# Patient Record
Sex: Female | Born: 1950 | Race: White | Hispanic: No | Marital: Single | Smoking: Never smoker
Health system: Southern US, Community
[De-identification: ages and names within clinical notes are randomized; demographics above are authoritative.]

## PROBLEM LIST (undated history)

## (undated) DIAGNOSIS — I10 Essential (primary) hypertension: Secondary | ICD-10-CM

## (undated) DIAGNOSIS — A419 Sepsis, unspecified organism: Secondary | ICD-10-CM

## (undated) DIAGNOSIS — K8020 Calculus of gallbladder without cholecystitis without obstruction: Secondary | ICD-10-CM

## (undated) DIAGNOSIS — N200 Calculus of kidney: Secondary | ICD-10-CM

## (undated) DIAGNOSIS — G809 Cerebral palsy, unspecified: Secondary | ICD-10-CM

## (undated) DIAGNOSIS — K219 Gastro-esophageal reflux disease without esophagitis: Secondary | ICD-10-CM

## (undated) DIAGNOSIS — N189 Chronic kidney disease, unspecified: Secondary | ICD-10-CM

## (undated) DIAGNOSIS — N12 Tubulo-interstitial nephritis, not specified as acute or chronic: Secondary | ICD-10-CM

## (undated) DIAGNOSIS — F419 Anxiety disorder, unspecified: Secondary | ICD-10-CM

## (undated) DIAGNOSIS — Z87442 Personal history of urinary calculi: Secondary | ICD-10-CM

## (undated) DIAGNOSIS — F79 Unspecified intellectual disabilities: Secondary | ICD-10-CM

## (undated) DIAGNOSIS — E7800 Pure hypercholesterolemia, unspecified: Secondary | ICD-10-CM

## (undated) DIAGNOSIS — E039 Hypothyroidism, unspecified: Secondary | ICD-10-CM

---

## 2011-02-11 ENCOUNTER — Other Ambulatory Visit: Payer: Self-pay

## 2011-02-11 ENCOUNTER — Inpatient Hospital Stay (HOSPITAL_COMMUNITY)
Admission: EM | Admit: 2011-02-11 | Discharge: 2011-02-14 | DRG: 690 | Disposition: A | Discharge status: Home Health | Payer: PRIVATE HEALTH INSURANCE | Attending: Internal Medicine | Admitting: Internal Medicine

## 2011-02-11 ENCOUNTER — Emergency Department (HOSPITAL_COMMUNITY): Payer: PRIVATE HEALTH INSURANCE

## 2011-02-11 ENCOUNTER — Encounter (HOSPITAL_COMMUNITY): Payer: Self-pay

## 2011-02-11 DIAGNOSIS — Z6841 Body Mass Index (BMI) 40.0 and over, adult: Secondary | ICD-10-CM

## 2011-02-11 DIAGNOSIS — Z79899 Other long term (current) drug therapy: Secondary | ICD-10-CM

## 2011-02-11 DIAGNOSIS — F79 Unspecified intellectual disabilities: Secondary | ICD-10-CM | POA: Diagnosis present

## 2011-02-11 DIAGNOSIS — N390 Urinary tract infection, site not specified: Secondary | ICD-10-CM

## 2011-02-11 DIAGNOSIS — I10 Essential (primary) hypertension: Secondary | ICD-10-CM | POA: Diagnosis present

## 2011-02-11 DIAGNOSIS — N10 Acute tubulo-interstitial nephritis: Secondary | ICD-10-CM | POA: Diagnosis present

## 2011-02-11 DIAGNOSIS — R1011 Right upper quadrant pain: Secondary | ICD-10-CM | POA: Diagnosis present

## 2011-02-11 DIAGNOSIS — E079 Disorder of thyroid, unspecified: Secondary | ICD-10-CM | POA: Diagnosis present

## 2011-02-11 DIAGNOSIS — R630 Anorexia: Secondary | ICD-10-CM | POA: Diagnosis present

## 2011-02-11 DIAGNOSIS — E119 Type 2 diabetes mellitus without complications: Secondary | ICD-10-CM | POA: Diagnosis present

## 2011-02-11 DIAGNOSIS — E7800 Pure hypercholesterolemia, unspecified: Secondary | ICD-10-CM | POA: Diagnosis present

## 2011-02-11 DIAGNOSIS — F411 Generalized anxiety disorder: Secondary | ICD-10-CM | POA: Diagnosis present

## 2011-02-11 DIAGNOSIS — N12 Tubulo-interstitial nephritis, not specified as acute or chronic: Secondary | ICD-10-CM

## 2011-02-11 DIAGNOSIS — G809 Cerebral palsy, unspecified: Secondary | ICD-10-CM | POA: Diagnosis present

## 2011-02-11 DIAGNOSIS — K8020 Calculus of gallbladder without cholecystitis without obstruction: Secondary | ICD-10-CM | POA: Diagnosis present

## 2011-02-11 DIAGNOSIS — N179 Acute kidney failure, unspecified: Secondary | ICD-10-CM | POA: Diagnosis present

## 2011-02-11 DIAGNOSIS — Z7982 Long term (current) use of aspirin: Secondary | ICD-10-CM

## 2011-02-11 DIAGNOSIS — N19 Unspecified kidney failure: Secondary | ICD-10-CM

## 2011-02-11 DIAGNOSIS — F4000 Agoraphobia, unspecified: Secondary | ICD-10-CM | POA: Diagnosis present

## 2011-02-11 DIAGNOSIS — E871 Hypo-osmolality and hyponatremia: Secondary | ICD-10-CM | POA: Diagnosis present

## 2011-02-11 DIAGNOSIS — E860 Dehydration: Secondary | ICD-10-CM | POA: Diagnosis present

## 2011-02-11 HISTORY — DX: Unspecified intellectual disabilities: F79

## 2011-02-11 HISTORY — DX: Pure hypercholesterolemia, unspecified: E78.00

## 2011-02-11 HISTORY — DX: Anxiety disorder, unspecified: F41.9

## 2011-02-11 HISTORY — DX: Essential (primary) hypertension: I10

## 2011-02-11 LAB — DIFFERENTIAL
Basophils Absolute: 0 10*3/uL (ref 0.0–0.1)
Basophils Relative: 0 % (ref 0–1)
Lymphocytes Relative: 5 % — ABNORMAL LOW (ref 12–46)
Monocytes Absolute: 1.1 10*3/uL — ABNORMAL HIGH (ref 0.1–1.0)
Monocytes Relative: 5 % (ref 3–12)
Neutro Abs: 19.9 10*3/uL — ABNORMAL HIGH (ref 1.7–7.7)
Neutrophils Relative %: 90 % — ABNORMAL HIGH (ref 43–77)

## 2011-02-11 LAB — COMPREHENSIVE METABOLIC PANEL
AST: 25 U/L (ref 0–37)
Albumin: 2.7 g/dL — ABNORMAL LOW (ref 3.5–5.2)
Alkaline Phosphatase: 101 U/L (ref 39–117)
CO2: 19 mEq/L (ref 19–32)
Chloride: 96 mEq/L (ref 96–112)
Creatinine, Ser: 3.11 mg/dL — ABNORMAL HIGH (ref 0.50–1.10)
GFR calc non Af Amer: 15 mL/min — ABNORMAL LOW (ref >90)
Potassium: 4 mEq/L (ref 3.5–5.1)
Total Bilirubin: 0.5 mg/dL (ref 0.3–1.2)

## 2011-02-11 LAB — URINALYSIS, ROUTINE W REFLEX MICROSCOPIC
Glucose, UA: NEGATIVE mg/dL
Ketones, ur: NEGATIVE mg/dL
Protein, ur: 30 mg/dL — AB
pH: 5 (ref 5.0–8.0)

## 2011-02-11 LAB — URINE MICROSCOPIC-ADD ON

## 2011-02-11 LAB — CBC
HCT: 34.9 % — ABNORMAL LOW (ref 36.0–46.0)
Hemoglobin: 11.7 g/dL — ABNORMAL LOW (ref 12.0–15.0)
MCHC: 33.5 g/dL (ref 30.0–36.0)
RDW: 13.4 % (ref 11.5–15.5)
WBC: 22.2 10*3/uL — ABNORMAL HIGH (ref 4.0–10.5)

## 2011-02-11 MED ORDER — DEXTROSE 5 % IV SOLN
1.0000 g | Freq: Once | INTRAVENOUS | Status: AC
  Administered 2011-02-11: 1 g via INTRAVENOUS
  Filled 2011-02-11: qty 10

## 2011-02-11 MED ORDER — SODIUM CHLORIDE 0.9 % IV BOLUS (SEPSIS)
1000.0000 mL | Freq: Once | INTRAVENOUS | Status: AC
  Administered 2011-02-11: 1000 mL via INTRAVENOUS

## 2011-02-11 MED ORDER — SODIUM CHLORIDE 0.9 % IV BOLUS (SEPSIS)
500.0000 mL | Freq: Once | INTRAVENOUS | Status: AC
  Administered 2011-02-11: 500 mL via INTRAVENOUS

## 2011-02-11 NOTE — ED Notes (Signed)
Has an elevated white count and infection in gall bladder. Abdominal pain per sister.  Had an antibiotic shot at home today by MD. Was sent over by the MD to be evaluated for infection and possible surgery per family. Had a pill for sedation prior to arrival and also had a xanax per sister. Has not been out of the house for 31 years per family.

## 2011-02-11 NOTE — H&P (Signed)
PCP:   Marval Regal, MD, MD   Chief Complaint:  Fever abdominal pain  HPI:  61 year old female with a history of diabetes, hypertension, hypothyroidism, mental retardation who is basically nonverbal and severe a Gore phobia who is not left her house in over 30 years presents to emergency department after several days of running fevers up to 103 with very foul-smelling urine and abdominal pain. She is ambulatory at home, she is able to feed herself but she cannot effectively communicate. She is taking care of by her siblings both of her parents are deceased. Her primary care physician does home visits and went to see her yesterday at home because she has been running fever and complaining of abdominal pain which is thought to be in the right upper quadrant. It was thought that she had acute cholecystitis and was significantly dehydrated so was decided to sedate her in order for her to come to the hospital. She has not been screaming out her complaining of any abdominal pain since she's been here however again she's been mildly sedated. She is currently awake and alert and appears comfortable. One of her sisters is with her. She was given a shot of antibiotic yesterday at home by her primary care physician which am assuming was probably Rocephin. I'm not sure if the urine culture was sent off yesterday during that home evaluation. Her sister says she's not been eating and drinking for several days and she frequently has been grasping at her right upper quadrant and saying that it hurts. Specific history of the pain is difficult due to the patient's mental retardation, and she cannot answer specific questions and is unreliable. There is been no nausea or vomiting or diarrhea. There has been no significant cough or dyspnea.  Review of Systems:  Otherwise unobtainable from the patient  Past Medical History: Past Medical History  Diagnosis Date  . Diabetes mellitus   . Hypertension   . Thyroid disease    . Anxiety   . High cholesterol   . MR (mental retardation)    History reviewed. No pertinent past surgical history.  Medications: Prior to Admission medications   Medication Sig Start Date End Date Taking? Authorizing Provider  alprazolam Duanne Moron) 2 MG tablet Take 1 mg by mouth 3 (three) times daily.   Yes Historical Provider, MD  aspirin EC 81 MG tablet Take 81 mg by mouth daily.   Yes Historical Provider, MD  busPIRone (BUSPAR) 10 MG tablet Take 10 mg by mouth daily.   Yes Historical Provider, MD  chlorproMAZINE (THORAZINE) 25 MG tablet Take 25 mg by mouth 2 (two) times daily.   Yes Historical Provider, MD  escitalopram (LEXAPRO) 20 MG tablet Take 20 mg by mouth at bedtime.   Yes Historical Provider, MD  levothyroxine (SYNTHROID, LEVOTHROID) 25 MCG tablet Take 25 mcg by mouth daily.   Yes Historical Provider, MD  metFORMIN (GLUCOPHAGE) 500 MG tablet Take 500 mg by mouth daily as needed. As needed if blood sugar levels are higher than normal   Yes Historical Provider, MD  promethazine (PHENERGAN) 25 MG tablet Take 25 mg by mouth every 6 (six) hours as needed. For nausea. **Take one tablet by mouth every 4 to 6 hours as needed for nausea**   Yes Historical Provider, MD  spironolactone (ALDACTONE) 50 MG tablet Take 50 mg by mouth 2 (two) times daily.   Yes Historical Provider, MD  zolpidem (AMBIEN) 10 MG tablet Take 10 mg by mouth at bedtime.   Yes Historical  Provider, MD    Allergies:  No Known Allergies  Social History:  reports that she has never smoked. She does not have any smokeless tobacco history on file. She reports that she does not drink alcohol or use illicit drugs.  Family History: No family history on file.  Physical Exam: Filed Vitals:   02/11/11 1939 02/11/11 2054 02/11/11 2104 02/11/11 2142  BP: 102/42 97/62 110/54 110/54  Pulse:  88  88  Temp: 99.2 F (37.3 C)  97.9 F (36.6 C)   TempSrc: Oral  Oral   Resp: 28 20 32   Height: 5' (1.524 m)     Weight:  158.759 kg (350 lb)     SpO2: 92% 94% 94% 93%   BP 101/41  Pulse 88  Temp(Src) 98.7 F (37.1 C) (Oral)  Resp 28  Ht 5' (1.524 m)  Wt 137.4 kg (302 lb 14.6 oz)  BMI 59.16 kg/m2  SpO2 97% General appearance: alert, cooperative, no distress and moderately obese Lungs: clear to auscultation bilaterally Heart: regular rate and rhythm, S1, S2 normal, no murmur, click, rub or gallop Abdomen: soft, non-tender; bowel sounds normal; no masses,  no organomegaly Extremities: extremities normal, atraumatic, no cyanosis or edema Pulses: 2+ and symmetric Skin: Skin color, texture, turgor normal. No rashes or lesions Neurologic: Grossly normal    Labs on Admission:   [facility] 02/11/11 1957  NA 130*  K 4.0  CL 96  CO2 19  GLUCOSE 129*  BUN 53*  CREATININE 3.11*  CALCIUM 9.1  MG --  PHOS --    Basename 02/11/11 1957  AST 25  ALT 18  ALKPHOS 101  BILITOT 0.5  PROT 7.6  ALBUMIN 2.7*    Basename 02/11/11 1957  LIPASE 12  AMYLASE --    Basename 02/11/11 1957  WBC 22.2*  NEUTROABS 19.9*  HGB 11.7*  HCT 34.9*  MCV 84.1  PLT 214    Radiological Exams on Admission: Dg Chest Port 1 View  02/11/2011  *RADIOLOGY REPORT*  Clinical Data: Fever.  Elevated white blood count.  PORTABLE CHEST - 1 VIEW  Comparison: None.  Findings: There is mild cardiomegaly.  Pulmonary vascularity is normal and the lungs are clear.  No osseous abnormality.  IMPRESSION: Mild cardiomegaly.  Original Report Authenticated By: Larey Seat, M.D.    Assessment/Plan Present on Admission:  61 year old female with several days of fever and abdominal pain with a history of severe bore phobia and mental retardation  .Abdominal pain, right upper quadrant this could be pyelonephritis she has a significant urinary tract infection going to place her on Rocephin urine culture has been sent off for culture her abdominal exam is benign by myself and by the emergency room physician. However she did have to be  sedated to get her to the hospital today but she seems to be at her baseline right now according to her sister and her abdominal exam is still benign. I'm also going to proceed with ultrasound of her abdomen to look at her gallbladder better and also to assess her kidneys.  Marland KitchenUTI (urinary tract infection) Rocephin  .Pyelonephritis probable  .Mental retardation .Agoraphobia continue Xanax as needed  .Acute renal failure she has significant renal failure again proceed with ultrasound this is likely due to significant dehydration and infection placed on IV fluids and monitor her urinary output and creatinine closely.  Marland KitchenHyponatremia IV fluids  .Dehydration IV fluids  Going to continue her chronic Xanax and try to hold off on excessive sedation unless  her anxiety gets extreme. Currently she seems to be dealing with everything very well and does not appear overly anxious or uncomfortable. Family already has home health and a Education officer, museum set up at home. There are several siblings involved and nieces and nephews that helps to take care of her. There is no healthcare power of attorney however that is officially decided upon. This probably needs to be addressed before she is discharged. She will likely improve over the next several days with IV fluids and antibiotics.   Rachel Morgan A [id number] 02/11/2011, 10:58 PM

## 2011-02-11 NOTE — ED Provider Notes (Signed)
History   This chart was scribed for Sharyon Cable, MD by Kathreen Cornfield. The patient was seen in room APA04/APA04 and the patient's care was started at 7:42PM.    CSN: [id number]  Arrival date & time 02/11/11  [id number]   First MD Initiated Contact with Patient 02/11/11 1939     Level 5 Caveat: history of mental retardation    Chief Complaint  Patient presents with  . Abdominal Pain     Patient is a 61 y.o. female presenting with abdominal pain. The history is provided by the patient and a relative. The history is limited by the condition of the patient. No language interpreter was used.  Abdominal Pain The primary symptoms of the illness include abdominal pain (Pt holds her stomach and says "it hurts".) and vomiting. The primary symptoms of the illness do not include shortness of breath or diarrhea. The current episode started 6 to 12 hours ago. The onset of the illness was sudden. The problem has not changed since onset. The abdominal pain began 6 to 12 hours ago. The pain came on suddenly. The abdominal pain has been unchanged since its onset. The abdominal pain is generalized. The abdominal pain does not radiate. The abdominal pain is relieved by nothing.  The patient states that she believes she is currently not pregnant. The patient has not had a change in bowel habit. Symptoms associated with the illness do not include chills, anorexia or diaphoresis. Significant associated medical issues include diabetes. Significant associated medical issues do not include gallstones.   Pt has a history of diabetes, thyroid disease, and high cholesterol. She has h/o mental retardation and severe agoraphobia, per family pt has not left house in 30 yrs She has been seen by her PCP at her house, and it was felt that she had acute cholecystitis as she had fevers at home and elevated WBC by labs Pt has had pain on/off for years per family but worse in past day    History  Substance Use Topics  .  Smoking status: Never Smoker   . Smokeless tobacco: Not on file  . Alcohol Use: No    OB History    Grav Para Term Preterm Abortions TAB SAB Ect Mult Living                  Review of Systems  Unable to perform ROS: Psychiatric disorder  Constitutional: Negative for chills and diaphoresis.  Respiratory: Negative for shortness of breath.   Gastrointestinal: Positive for vomiting and abdominal pain (Pt holds her stomach and says "it hurts".). Negative for diarrhea and anorexia.    Allergies  Review of patient's allergies indicates no known allergies.  Home Medications   Current Outpatient Rx  Name Route Sig Dispense Refill  . ALPRAZOLAM 2 MG PO TABS Oral Take 1 mg by mouth 3 (three) times daily.    . ASPIRIN EC 81 MG PO TBEC Oral Take 81 mg by mouth daily.    . BUSPIRONE HCL 10 MG PO TABS Oral Take 10 mg by mouth daily.    . CHLORPROMAZINE HCL 25 MG PO TABS Oral Take 25 mg by mouth 2 (two) times daily.    Marland Kitchen ESCITALOPRAM OXALATE 20 MG PO TABS Oral Take 20 mg by mouth at bedtime.    Marland Kitchen LEVOTHYROXINE SODIUM 25 MCG PO TABS Oral Take 25 mcg by mouth daily.    Marland Kitchen METFORMIN HCL 500 MG PO TABS Oral Take 500 mg by mouth daily as needed.  As needed if blood sugar levels are higher than normal    . PROMETHAZINE HCL 25 MG PO TABS Oral Take 25 mg by mouth every 6 (six) hours as needed. For nausea. **Take one tablet by mouth every 4 to 6 hours as needed for nausea**    . SPIRONOLACTONE 50 MG PO TABS Oral Take 50 mg by mouth 2 (two) times daily.    Marland Kitchen ZOLPIDEM TARTRATE 10 MG PO TABS Oral Take 10 mg by mouth at bedtime.      BP 102/42  Temp(Src) 99.2 F (37.3 C) (Oral)  Resp 28  Ht 5' (1.524 m)  Wt 350 lb (158.759 kg)  BMI 68.35 kg/m2  SpO2 92%  Physical Exam  CONSTITUTIONAL: Well developed/well nourished HEAD AND FACE: Normocephalic/atraumatic EYES: EOMI/PERRL ENMT: Mucous membranes dry NECK: supple no meningeal signs SPINE:entire spine nontender CV: S1/S2 noted, no  murmurs/rubs/gallops noted LUNGS:  decreased breath sounds bilaterally ABDOMEN: soft, nontender, no rebound or guarding.  No RUQ tenderness GU:no cva tenderness NEURO: Pt is awake/alert, moves all extremitiesx4 , pt follows commands but has minimal verbal response (baseline) EXTREMITIES: pulses normal, full ROM SKIN: warm, color normal PSYCH: no abnormalities of mood noted, anxious.    ED Course  Procedures   DIAGNOSTIC STUDIES: Oxygen Saturation is 92% on room air, low by my interpretation.    COORDINATION OF CARE:  Results for orders placed during the hospital encounter of 02/11/11  CBC      Component Value Range   WBC 22.2 (*) 4.0 - 10.5 (K/uL)   RBC 4.15  3.87 - 5.11 (MIL/uL)   Hemoglobin 11.7 (*) 12.0 - 15.0 (g/dL)   HCT 34.9 (*) 36.0 - 46.0 (%)   MCV 84.1  78.0 - 100.0 (fL)   MCH 28.2  26.0 - 34.0 (pg)   MCHC 33.5  30.0 - 36.0 (g/dL)   RDW 13.4  11.5 - 15.5 (%)   Platelets 214  150 - 400 (K/uL)  DIFFERENTIAL      Component Value Range   Neutrophils Relative 90 (*) 43 - 77 (%)   Neutro Abs 19.9 (*) 1.7 - 7.7 (K/uL)   Lymphocytes Relative 5 (*) 12 - 46 (%)   Lymphs Abs 1.1  0.7 - 4.0 (K/uL)   Monocytes Relative 5  3 - 12 (%)   Monocytes Absolute 1.1 (*) 0.1 - 1.0 (K/uL)   Eosinophils Relative 0  0 - 5 (%)   Eosinophils Absolute 0.0  0.0 - 0.7 (K/uL)   Basophils Relative 0  0 - 1 (%)   Basophils Absolute 0.0  0.0 - 0.1 (K/uL)  COMPREHENSIVE METABOLIC PANEL      Component Value Range   Sodium 130 (*) 135 - 145 (mEq/L)   Potassium 4.0  3.5 - 5.1 (mEq/L)   Chloride 96  96 - 112 (mEq/L)   CO2 19  19 - 32 (mEq/L)   Glucose, Bld 129 (*) 70 - 99 (mg/dL)   BUN 53 (*) 6 - 23 (mg/dL)   Creatinine, Ser 3.11 (*) 0.50 - 1.10 (mg/dL)   Calcium 9.1  8.4 - 10.5 (mg/dL)   Total Protein 7.6  6.0 - 8.3 (g/dL)   Albumin 2.7 (*) 3.5 - 5.2 (g/dL)   AST 25  0 - 37 (U/L)   ALT 18  0 - 35 (U/L)   Alkaline Phosphatase 101  39 - 117 (U/L)   Total Bilirubin 0.5  0.3 - 1.2 (mg/dL)    GFR calc non Af Amer 15 (*) >90 (  mL/min)   GFR calc Af Amer 18 (*) >90 (mL/min)  LIPASE, BLOOD      Component Value Range   Lipase 12  11 - 59 (U/L)  URINALYSIS, ROUTINE W REFLEX MICROSCOPIC      Component Value Range   Color, Urine AMBER (*) YELLOW    APPearance HAZY (*) CLEAR    Specific Gravity, Urine 1.025  1.005 - 1.030    pH 5.0  5.0 - 8.0    Glucose, UA NEGATIVE  NEGATIVE (mg/dL)   Hgb urine dipstick LARGE (*) NEGATIVE    Bilirubin Urine SMALL (*) NEGATIVE    Ketones, ur NEGATIVE  NEGATIVE (mg/dL)   Protein, ur 30 (*) NEGATIVE (mg/dL)   Urobilinogen, UA 1.0  0.0 - 1.0 (mg/dL)   Nitrite NEGATIVE  NEGATIVE    Leukocytes, UA MODERATE (*) NEGATIVE   CULTURE, BLOOD (ROUTINE X 2)      Component Value Range   Specimen Description LEFT ANTECUBITAL     Special Requests BOTTLES DRAWN AEROBIC AND ANAEROBIC 5CC     Culture PENDING     Report Status PENDING    CULTURE, BLOOD (ROUTINE X 2)      Component Value Range   Specimen Description LEFT ANTECUBITAL     Special Requests BOTTLES DRAWN AEROBIC AND ANAEROBIC 5CC     Culture PENDING     Report Status PENDING    URINE MICROSCOPIC-ADD ON      Component Value Range   Squamous Epithelial / LPF FEW (*) RARE    WBC, UA TOO NUMEROUS TO COUNT  <3 (WBC/hpf)   RBC / HPF TOO NUMEROUS TO COUNT  <3 (RBC/hpf)   Bacteria, UA MANY (*) RARE    Casts GRANULAR CAST (*) NEGATIVE    Urine-Other AMORPHOUS URATES/PHOSPHATES     Dg Chest Port 1 View  02/11/2011  *RADIOLOGY REPORT*  Clinical Data: Fever.  Elevated white blood count.  PORTABLE CHEST - 1 VIEW  Comparison: None.  Findings: There is mild cardiomegaly.  Pulmonary vascularity is normal and the lungs are clear.  No osseous abnormality.  IMPRESSION: Mild cardiomegaly.  Original Report Authenticated By: Larey Seat, M.D.       7:50PM- EDP at bedside discusses treatment plan  Pt with reported h/o abd pain/fever/leukocytosis.  However on my exam her abd is soft She has not been out of  house in 30 yrs per family due to agoraphobia/mental retardation It was reported that her PCP called surgery ahead of time, but I will wait for labs/xray, may need CT imaging but I am not convinced this is cholecystitis at this time, will defer surgery consult for now Will obtain labs/cxr and reassess Will follow closely  9:48 PM Pt stable BP appropriate No localized abd tenderness, pt awake/alert,  Doubt acute abd process currently However uti/renal failure noted Will defer imaging Will admit D/w dr Shanon Brow, admit to medicine Pt stabilized in the ED  MDM  Nursing notes reviewed and considered in documentation All labs/vitals reviewed and considered xrays reviewed and considered PCP notes reviewed    Date: 02/11/2011  Rate: 90  Rhythm: normal sinus rhythm  QRS Axis: normal  Intervals: normal  ST/T Wave abnormalities: nonspecific ST changes  Conduction Disutrbances:none  Narrative Interpretation:   Old EKG Reviewed: none available     I personally performed the services described in this documentation, which was scribed in my presence. The recorded information has been reviewed and considered.      Sharyon Cable, MD  02/11/11 2149 

## 2011-02-11 NOTE — ED Notes (Signed)
14 french foley cath inserted without difficulty using sterile technique. Patient tolerated well. 56ml of dark amber tinged urine returned with sediment.

## 2011-02-12 ENCOUNTER — Inpatient Hospital Stay (HOSPITAL_COMMUNITY): Payer: PRIVATE HEALTH INSURANCE

## 2011-02-12 ENCOUNTER — Encounter (HOSPITAL_COMMUNITY): Payer: Self-pay | Admitting: *Deleted

## 2011-02-12 LAB — CBC
HCT: 32.9 % — ABNORMAL LOW (ref 36.0–46.0)
Hemoglobin: 11.2 g/dL — ABNORMAL LOW (ref 12.0–15.0)
WBC: 17.3 10*3/uL — ABNORMAL HIGH (ref 4.0–10.5)

## 2011-02-12 LAB — URINE CULTURE
Colony Count: NO GROWTH
Culture  Setup Time: 201302050250
Culture: NO GROWTH

## 2011-02-12 LAB — COMPREHENSIVE METABOLIC PANEL
ALT: 15 U/L (ref 0–35)
BUN: 48 mg/dL — ABNORMAL HIGH (ref 6–23)
Calcium: 8.7 mg/dL (ref 8.4–10.5)
GFR calc Af Amer: 22 mL/min — ABNORMAL LOW (ref >90)
Glucose, Bld: 130 mg/dL — ABNORMAL HIGH (ref 70–99)
Sodium: 132 mEq/L — ABNORMAL LOW (ref 135–145)
Total Protein: 6.7 g/dL (ref 6.0–8.3)

## 2011-02-12 LAB — MRSA PCR SCREENING: MRSA by PCR: NEGATIVE

## 2011-02-12 MED ORDER — SODIUM CHLORIDE 0.9 % IV SOLN
INTRAVENOUS | Status: AC
  Administered 2011-02-12 (×2): via INTRAVENOUS

## 2011-02-12 MED ORDER — ACETAMINOPHEN 325 MG PO TABS
650.0000 mg | ORAL_TABLET | Freq: Once | ORAL | Status: AC
  Administered 2011-02-12: 650 mg via ORAL

## 2011-02-12 MED ORDER — ACETAMINOPHEN 325 MG PO TABS
ORAL_TABLET | ORAL | Status: AC
  Filled 2011-02-12: qty 2

## 2011-02-12 MED ORDER — DEXTROSE 5 % IV SOLN
1.0000 g | INTRAVENOUS | Status: DC
  Administered 2011-02-12 – 2011-02-13 (×2): 1 g via INTRAVENOUS
  Filled 2011-02-12 (×3): qty 10

## 2011-02-12 MED ORDER — ASPIRIN EC 81 MG PO TBEC
81.0000 mg | DELAYED_RELEASE_TABLET | Freq: Every day | ORAL | Status: DC
  Administered 2011-02-12 – 2011-02-14 (×3): 81 mg via ORAL
  Filled 2011-02-12 (×3): qty 1

## 2011-02-12 MED ORDER — CHLORPROMAZINE HCL 25 MG PO TABS
25.0000 mg | ORAL_TABLET | Freq: Two times a day (BID) | ORAL | Status: DC
  Administered 2011-02-12 – 2011-02-14 (×5): 25 mg via ORAL
  Filled 2011-02-12 (×7): qty 1

## 2011-02-12 MED ORDER — ESCITALOPRAM OXALATE 10 MG PO TABS
20.0000 mg | ORAL_TABLET | Freq: Every day | ORAL | Status: DC
  Administered 2011-02-12 – 2011-02-13 (×2): 20 mg via ORAL
  Filled 2011-02-12 (×2): qty 2

## 2011-02-12 MED ORDER — LEVOTHYROXINE SODIUM 25 MCG PO TABS
25.0000 ug | ORAL_TABLET | Freq: Every day | ORAL | Status: DC
  Administered 2011-02-12 – 2011-02-14 (×3): 25 ug via ORAL
  Filled 2011-02-12 (×3): qty 1

## 2011-02-12 MED ORDER — BUSPIRONE HCL 5 MG PO TABS
10.0000 mg | ORAL_TABLET | Freq: Every day | ORAL | Status: DC
  Administered 2011-02-12 – 2011-02-14 (×3): 10 mg via ORAL
  Filled 2011-02-12 (×3): qty 2

## 2011-02-12 MED ORDER — ALPRAZOLAM 1 MG PO TABS
1.0000 mg | ORAL_TABLET | Freq: Three times a day (TID) | ORAL | Status: DC
  Administered 2011-02-12 (×3): 1 mg via ORAL
  Filled 2011-02-12: qty 2
  Filled 2011-02-12 (×2): qty 1
  Filled 2011-02-12: qty 2

## 2011-02-12 MED ORDER — ZOLPIDEM TARTRATE 5 MG PO TABS
10.0000 mg | ORAL_TABLET | Freq: Every day | ORAL | Status: DC
Start: 2011-02-12 — End: 2011-02-14
  Administered 2011-02-12 – 2011-02-13 (×3): 10 mg via ORAL
  Filled 2011-02-12 (×3): qty 2

## 2011-02-12 NOTE — Progress Notes (Signed)
Pt transferring to room 323. Report given to Sharyn Blitz RN. Pt's vital signs stable prior to transfer. Pt and family notified of plan.

## 2011-02-12 NOTE — Plan of Care (Signed)
Problem: Consults Goal: General Medical Patient Education See Patient Education Module for specific education.  Outcome: Progressing Pt has agoraphobia but is calm and resting with family @ bedside. Dr Shanon Brow has been in & updated family on Pt & goals of treatment. Goal: Skin Care Protocol Initiated - if indicated If consults are not indicated, leave blank or document N/A  Outcome: Progressing Pt does not have any skin problems

## 2011-02-12 NOTE — Progress Notes (Signed)
Subjective: Patient is minimally able to participate in history due to cognitive deficits.  Family at bedside and reports that she looks better than yesterday  Objective: Vital signs in last 24 hours: Temp:  [97.6 F (36.4 C)-99.9 F (37.7 C)] 97.6 F (36.4 C) (02/05 0700) Pulse Rate:  [82-92] 92  (02/05 0800) Resp:  [20-32] 29  (02/05 0800) BP: (95-111)/(41-62) 101/48 mmHg (02/05 0800) SpO2:  [91 %-97 %] 95 % (02/05 0800) Weight:  [137.4 kg (302 lb 14.6 oz)-158.759 kg (350 lb)] 140.5 kg (309 lb 11.9 oz) (02/05 0400) Weight change:  Last BM Date: 02/09/11  Intake/Output from previous day: 02/04 0701 - 02/05 0700 In: 550 [I.V.:550] Out: 800 [Urine:800] Total I/O In: 100 [I.V.:100] Out: -    Physical Exam: General: Alert, awake,  in no acute distress. HEENT: No bruits, no goiter. Heart: Regular rate and rhythm, without murmurs, rubs, gallops. Lungs: Clear to auscultation bilaterally. Abdomen: Soft, nontender, nondistended, positive bowel sounds. Extremities: No clubbing cyanosis or edema with positive pedal pulses. Neuro: Grossly intact, nonfocal.    Lab Results: Basic Metabolic Panel:  Basename 02/12/11 0445 02/11/11 1957  NA 132* 130*  K 4.1 4.0  CL 101 96  CO2 22 19  GLUCOSE 130* 129*  BUN 48* 53*  CREATININE 2.60* 3.11*  CALCIUM 8.7 9.1  MG -- --  PHOS -- --   Liver Function Tests:  [facility] 02/12/11 0445 02/11/11 1957  AST 19 25  ALT 15 18  ALKPHOS 88 101  BILITOT 0.4 0.5  PROT 6.7 7.6  ALBUMIN 2.3* 2.7*    Basename 02/11/11 1957  LIPASE 12  AMYLASE --   No results found for this basename: AMMONIA:2 in the last 72 hours CBC:  Basename 02/12/11 0445 02/11/11 1957  WBC 17.3* 22.2*  NEUTROABS -- 19.9*  HGB 11.2* 11.7*  HCT 32.9* 34.9*  MCV 84.8 84.1  PLT 195 214   Cardiac Enzymes: No results found for this basename: CKTOTAL:3,CKMB:3,CKMBINDEX:3,TROPONINI:3 in the last 72 hours BNP: No results found for this basename: PROBNP:3 in the  last 72 hours D-Dimer: No results found for this basename: DDIMER:2 in the last 72 hours CBG: No results found for this basename: GLUCAP:6 in the last 72 hours Hemoglobin A1C: No results found for this basename: HGBA1C in the last 72 hours Fasting Lipid Panel: No results found for this basename: CHOL,HDL,LDLCALC,TRIG,CHOLHDL,LDLDIRECT in the last 72 hours Thyroid Function Tests: No results found for this basename: TSH,T4TOTAL,FREET4,T3FREE,THYROIDAB in the last 72 hours Anemia Panel: No results found for this basename: VITAMINB12,FOLATE,FERRITIN,TIBC,IRON,RETICCTPCT in the last 72 hours Coagulation: No results found for this basename: LABPROT:2,INR:2 in the last 72 hours Urine Drug Screen: Drugs of Abuse  No results found for this basename: labopia, cocainscrnur, labbenz, amphetmu, thcu, labbarb    Alcohol Level: No results found for this basename: ETH:2 in the last 72 hours Urinalysis:  Basename 02/11/11 2035  COLORURINE AMBER*  LABSPEC 1.025  PHURINE 5.0  GLUCOSEU NEGATIVE  HGBUR LARGE*  BILIRUBINUR SMALL*  KETONESUR NEGATIVE  PROTEINUR 30*  UROBILINOGEN 1.0  NITRITE NEGATIVE  LEUKOCYTESUR MODERATE*    Recent Results (from the past 240 hour(s))  CULTURE, BLOOD (ROUTINE X 2)     Status: Normal (Preliminary result)   Collection Time   02/11/11  7:57 PM      Component Value Range Status Comment   Specimen Description LEFT ANTECUBITAL   Final    Special Requests BOTTLES DRAWN AEROBIC AND ANAEROBIC 5CC   Final    Culture PENDING  Incomplete    Report Status PENDING   Incomplete   CULTURE, BLOOD (ROUTINE X 2)     Status: Normal (Preliminary result)   Collection Time   02/11/11  9:17 PM      Component Value Range Status Comment   Specimen Description LEFT ANTECUBITAL   Final    Special Requests BOTTLES DRAWN AEROBIC AND ANAEROBIC 5CC   Final    Culture PENDING   Incomplete    Report Status PENDING   Incomplete   MRSA PCR SCREENING     Status: Normal   Collection Time    02/12/11 12:32 AM      Component Value Range Status Comment   MRSA by PCR NEGATIVE  NEGATIVE  Final     Studies/Results: Dg Chest Port 1 View  02/11/2011  *RADIOLOGY REPORT*  Clinical Data: Fever.  Elevated white blood count.  PORTABLE CHEST - 1 VIEW  Comparison: None.  Findings: There is mild cardiomegaly.  Pulmonary vascularity is normal and the lungs are clear.  No osseous abnormality.  IMPRESSION: Mild cardiomegaly.  Original Report Authenticated By: Larey Seat, M.D.    Medications: Scheduled Meds:   . alprazolam  1 mg Oral TID  . aspirin EC  81 mg Oral Daily  . busPIRone  10 mg Oral Daily  . cefTRIAXone (ROCEPHIN)  IV  1 g Intravenous Once  . cefTRIAXone (ROCEPHIN)  IV  1 g Intravenous Q24H  . chlorproMAZINE  25 mg Oral BID  . escitalopram  20 mg Oral QHS  . levothyroxine  25 mcg Oral Daily  . sodium chloride  1,000 mL Intravenous Once  . sodium chloride  500 mL Intravenous Once  . zolpidem  10 mg Oral QHS   Continuous Infusions:   . sodium chloride 100 mL/hr at 02/12/11 0800   PRN Meds:.  Assessment/Plan:  Principal Problem:  *Abdominal pain, right upper quadrant, possibly due to pyelonephritis.  Liver enzymes are normal.  Ultrasound of abdomen pending. Appears comfortable right now, advance diet as tolerated. Active Problems:  UTI (urinary tract infection), on rocephin, follow up urine culture  Pyelonephritis, see above  Mental retardation  Agoraphobia, on prn xanax  Acute renal failure, baseline creatinine is unknown, but appears to be pre-renal.  Patient getting IV fluids, and will also get renal ultrasound today.  Creatinine has some mild improvement since yesterday.  Likely secondary to dehydration.  Hyponatremia, likely secondary to dehydration   Plan will likely be to return home. She is not ready for discharge yet.    LOS: 1 day   Eliud Polo [facility] Pager: [phone] 02/12/2011, 10:26 AM

## 2011-02-13 LAB — CBC
Hemoglobin: 11.2 g/dL — ABNORMAL LOW (ref 12.0–15.0)
MCH: 28.4 pg (ref 26.0–34.0)
MCV: 85.3 fL (ref 78.0–100.0)
RBC: 3.95 MIL/uL (ref 3.87–5.11)

## 2011-02-13 LAB — BASIC METABOLIC PANEL
BUN: 34 mg/dL — ABNORMAL HIGH (ref 6–23)
CO2: 21 mEq/L (ref 19–32)
Calcium: 9.1 mg/dL (ref 8.4–10.5)
Creatinine, Ser: 2.01 mg/dL — ABNORMAL HIGH (ref 0.50–1.10)
Glucose, Bld: 122 mg/dL — ABNORMAL HIGH (ref 70–99)

## 2011-02-13 MED ORDER — POLYETHYLENE GLYCOL 3350 17 G PO PACK
17.0000 g | PACK | Freq: Every day | ORAL | Status: DC
  Administered 2011-02-13 – 2011-02-14 (×2): 17 g via ORAL
  Filled 2011-02-13 (×2): qty 1

## 2011-02-13 MED ORDER — ALPRAZOLAM 1 MG PO TABS
1.0000 mg | ORAL_TABLET | Freq: Three times a day (TID) | ORAL | Status: DC | PRN
  Filled 2011-02-13: qty 1

## 2011-02-13 MED ORDER — SODIUM CHLORIDE 0.9 % IV SOLN
INTRAVENOUS | Status: DC
  Administered 2011-02-13 – 2011-02-14 (×2): via INTRAVENOUS

## 2011-02-13 MED ORDER — SODIUM CHLORIDE 0.9 % IJ SOLN
INTRAMUSCULAR | Status: AC
  Administered 2011-02-13: 16:00:00
  Filled 2011-02-13: qty 3

## 2011-02-13 NOTE — Progress Notes (Signed)
Subjective: More awake and alert today, has non specific abdominal pain, no vomiting,   Objective: Vital signs in last 24 hours: Temp:  [97.3 F (36.3 C)-99 F (37.2 C)] 97.3 F (36.3 C) (02/06 1400) Pulse Rate:  [76-91] 80  (02/06 1400) Resp:  [20-24] 20  (02/06 1400) BP: (100-133)/(61-72) 121/72 mmHg (02/06 1400) SpO2:  [90 %-97 %] 93 % (02/06 1400) Weight:  [139.9 kg (308 lb 6.8 oz)] 139.9 kg (308 lb 6.8 oz) (02/06 0606) Weight change: -18.859 kg (-41 lb 9.2 oz) Last BM Date: 02/09/11  Intake/Output from previous day: 02/05 0701 - 02/06 0700 In: 1172 [I.V.:100; IV Piggyback:50] Out: 3000 [Urine:3000] Total I/O In: 10 [I.V.:10] Out: 1000 [Urine:1000]   Physical Exam: General: Alert, awake,in no acute distress. HEENT: No bruits, no goiter. Heart: Regular rate and rhythm, without murmurs, rubs, gallops. Lungs: Clear to auscultation bilaterally. Abdomen: Soft, some tenderness on left side of abd, nondistended, positive bowel sounds. Extremities: No clubbing cyanosis or edema with positive pedal pulses. Neuro: Grossly intact, nonfocal.    Lab Results: Basic Metabolic Panel:  Basename 02/13/11 0543 02/12/11 0445  NA 136 132*  K 4.2 4.1  CL 107 101  CO2 21 22  GLUCOSE 122* 130*  BUN 34* 48*  CREATININE 2.01* 2.60*  CALCIUM 9.1 8.7  MG -- --  PHOS -- --   Liver Function Tests:  [facility] 02/12/11 0445 02/11/11 1957  AST 19 25  ALT 15 18  ALKPHOS 88 101  BILITOT 0.4 0.5  PROT 6.7 7.6  ALBUMIN 2.3* 2.7*    Basename 02/11/11 1957  LIPASE 12  AMYLASE --   No results found for this basename: AMMONIA:2 in the last 72 hours CBC:  Basename 02/13/11 0543 02/12/11 0445 02/11/11 1957  WBC 15.8* 17.3* --  NEUTROABS -- -- 19.9*  HGB 11.2* 11.2* --  HCT 33.7* 32.9* --  MCV 85.3 84.8 --  PLT 196 195 --   Cardiac Enzymes: No results found for this basename: CKTOTAL:3,CKMB:3,CKMBINDEX:3,TROPONINI:3 in the last 72 hours BNP: No results found for this basename:  PROBNP:3 in the last 72 hours D-Dimer: No results found for this basename: DDIMER:2 in the last 72 hours CBG:  Basename 02/13/11 0732  GLUCAP 105*   Hemoglobin A1C: No results found for this basename: HGBA1C in the last 72 hours Fasting Lipid Panel: No results found for this basename: CHOL,HDL,LDLCALC,TRIG,CHOLHDL,LDLDIRECT in the last 72 hours Thyroid Function Tests: No results found for this basename: TSH,T4TOTAL,FREET4,T3FREE,THYROIDAB in the last 72 hours Anemia Panel: No results found for this basename: VITAMINB12,FOLATE,FERRITIN,TIBC,IRON,RETICCTPCT in the last 72 hours Coagulation: No results found for this basename: LABPROT:2,INR:2 in the last 72 hours Urine Drug Screen: Drugs of Abuse  No results found for this basename: labopia, cocainscrnur, labbenz, amphetmu, thcu, labbarb    Alcohol Level: No results found for this basename: ETH:2 in the last 72 hours Urinalysis:  Basename 02/11/11 2035  COLORURINE AMBER*  LABSPEC 1.025  PHURINE 5.0  GLUCOSEU NEGATIVE  HGBUR LARGE*  BILIRUBINUR SMALL*  KETONESUR NEGATIVE  PROTEINUR 30*  UROBILINOGEN 1.0  NITRITE NEGATIVE  LEUKOCYTESUR MODERATE*   * Recent Results (from the past 240 hour(s))  CULTURE, BLOOD (ROUTINE X 2)     Status: Normal (Preliminary result)   Collection Time   02/11/11  7:57 PM      Component Value Range Status Comment   Specimen Description LEFT ANTECUBITAL   Final    Special Requests BOTTLES DRAWN AEROBIC AND ANAEROBIC 5CC   Final    Culture  NO GROWTH 2 DAYS   Final    Report Status PENDING   Incomplete   URINE CULTURE     Status: Normal   Collection Time   02/11/11  8:35 PM      Component Value Range Status Comment   Specimen Description URINE, CATHETERIZED   Final    Special Requests NONE   Final    Culture  Setup Time [id number]   Final    Colony Count NO GROWTH   Final    Culture NO GROWTH   Final    Report Status 02/12/2011 FINAL   Final   CULTURE, BLOOD (ROUTINE X 2)     Status: Normal  (Preliminary result)   Collection Time   02/11/11  9:17 PM      Component Value Range Status Comment   Specimen Description LEFT ANTECUBITAL   Final    Special Requests BOTTLES DRAWN AEROBIC AND ANAEROBIC 5CC   Final    Culture NO GROWTH 2 DAYS   Final    Report Status PENDING   Incomplete   MRSA PCR SCREENING     Status: Normal   Collection Time   02/12/11 12:32 AM      Component Value Range Status Comment   MRSA by PCR NEGATIVE  NEGATIVE  Final     Studies/Results: US Abdomen Complete  02/12/2011  *RADIOLOGY REPORT*  Clinical Data:  Abdominal pain.  COMPLETE ABDOMINAL ULTRASOUND  Comparison:  None.  Findings:  Gallbladder:  Multiple echogenic foci are present.  There is shadowing.  The gallbladder wall thickness is within normal limits at 2.4 mm.  There is no sonographic Murphy's sign.  Common bile duct:  Normal in caliber. No biliary ductal dilation. The maximal diameter is 5.8 mm, within normal limits.  Liver:  There is diffuse increased echogenicity loss of normal internal echotexture.  No focal lesions are present.  IVC:  Appears normal.  Pancreas:  The pancreas is incompletely visualized due to body habitus.  Spleen:  Normal size and echotexture without focal parenchymal abnormality.  Right Kidney:  No hydronephrosis.  Well-preserved cortex.  Normal size and parenchymal echotexture without focal abnormalities.   The maximal length is 11.4 cm.  Left Kidney:  A linear echogenic focus is present at the lower pole of the left kidney.  This may represent a small stone.  There is no definite shadowing.  No hydronephrosis is evident.  No parenchymal lesions are seen.  Abdominal aorta:  The aorta is at the upper limits of normal for size, 2.9 cm.  IMPRESSION:  1.  Cholelithiasis without evidence for cholecystitis. 2.  Increased echogenicity of the liver and loss of internal echotexture is compatible with diffuse fatty infiltration. 3.  Linear echogenic focus at the lower pole of the left kidney may  represent a nonobstructing stone.  Original Report Authenticated By: Resa Miner. MATTERN, M.D.   Dg Chest Port 1 View  02/12/2011  *RADIOLOGY REPORT*  Clinical Data: Shortness of breath, diabetes, hypertension  PORTABLE CHEST - 1 VIEW  Comparison: Portable exam 1155 hours compared to 02/11/2011  Findings: Image quality degraded due to body habitus. Enlargement of cardiac silhouette with pulmonary vascular congestion. Question minimal atelectasis at bases. No gross infiltrate or pleural effusion. No pneumothorax or acute bony findings.  IMPRESSION: Enlargement of cardiac silhouette with pulmonary vascular congestion. Question minimal basilar atelectasis.  Original Report Authenticated By: Burnetta Sabin, M.D.   Dg Chest Port 1 View  02/11/2011  *RADIOLOGY REPORT*  Clinical Data: Fever.  Elevated white blood count.  PORTABLE CHEST - 1 VIEW  Comparison: None.  Findings: There is mild cardiomegaly.  Pulmonary vascularity is normal and the lungs are clear.  No osseous abnormality.  IMPRESSION: Mild cardiomegaly.  Original Report Authenticated By: Larey Seat, M.D.    Medications: Scheduled Meds:   . acetaminophen      . acetaminophen  650 mg Oral Once  . aspirin EC  81 mg Oral Daily  . busPIRone  10 mg Oral Daily  . cefTRIAXone (ROCEPHIN)  IV  1 g Intravenous Q24H  . chlorproMAZINE  25 mg Oral BID  . escitalopram  20 mg Oral QHS  . levothyroxine  25 mcg Oral Daily  . polyethylene glycol  17 g Oral Daily  . sodium chloride      . zolpidem  10 mg Oral QHS  . DISCONTD: alprazolam  1 mg Oral TID   Continuous Infusions:  PRN Meds:.alprazolam  Assessment/Plan: This is a 61 y/o female who has baseline mental retardation and severe agoraphobia.  She has not been out of her house in over 30 years.  She was being evaluated by her primary MD who visits her at home.  She was having fevers, poor po intake, and abdominal pain.  She was subsequently given some sedation and brought to the hospital for  evaluation. She was found to be dehydrated, in acute renal failure and was felt to have a pyelonephritis.  Principal Problem:  *Abdominal pain, non specific.  Abd ultrasound shows cholelithiasis without evidence of cholecystitis. Her LFTs are normal. She has not had a bowel movement in a few days.  We will start her on a bowel regimen.  Overall she is improving.  Active Problems:   Pyelonephritis, on IV antibiotics.  Can likely transition to po antibiotics tomorrow, her wbc count is improving and she has remained afebrile.  Follow up cultures.   Mental retardation   Agoraphobia on prn xanax   Acute renal failure, prerenal, due to dehydration, renal ultrasound neg, Improving with IV fluids   Hyponatremia, resolved IVF, likely due to volume depletion   Dehydration, Improving  Anticipate that she will be ready for discharge tomorrow, we will get PT to evaluate   LOS: 2 days   Rachel Morgan [facility] Pager: [phone] 02/13/2011, 6:44 PM

## 2011-02-14 LAB — BASIC METABOLIC PANEL
BUN: 28 mg/dL — ABNORMAL HIGH (ref 6–23)
Creatinine, Ser: 1.6 mg/dL — ABNORMAL HIGH (ref 0.50–1.10)
GFR calc Af Amer: 40 mL/min — ABNORMAL LOW (ref >90)
GFR calc non Af Amer: 34 mL/min — ABNORMAL LOW (ref >90)

## 2011-02-14 LAB — DIFFERENTIAL
Basophils Relative: 3 % — ABNORMAL HIGH (ref 0–1)
Eosinophils Absolute: 0.3 10*3/uL (ref 0.0–0.7)
Eosinophils Relative: 3 % (ref 0–5)
Monocytes Absolute: 1.4 10*3/uL — ABNORMAL HIGH (ref 0.1–1.0)
Monocytes Relative: 11 % (ref 3–12)

## 2011-02-14 LAB — CBC
HCT: 33.1 % — ABNORMAL LOW (ref 36.0–46.0)
Hemoglobin: 10.8 g/dL — ABNORMAL LOW (ref 12.0–15.0)
MCH: 28.1 pg (ref 26.0–34.0)
MCHC: 32.6 g/dL (ref 30.0–36.0)

## 2011-02-14 MED ORDER — CIPROFLOXACIN HCL 500 MG PO TABS: 500.0000 mg | ORAL_TABLET | Freq: Two times a day (BID) | ORAL | Status: AC

## 2011-02-14 MED ORDER — ALPRAZOLAM 1 MG PO TABS
1.0000 mg | ORAL_TABLET | Freq: Once | ORAL | Status: AC
  Administered 2011-02-14: 1 mg via ORAL

## 2011-02-14 NOTE — Progress Notes (Signed)
CARE MANAGEMENT NOTE 02/14/2011  Patient:  Rachel Morgan, Rachel Morgan   Account Number:  [id number]  Date Initiated:  02/14/2011  Documentation initiated by:  Claretha Cooper  Subjective/Objective Assessment:   Pt admitted with abdominal pain. PTA lived at home with family. Has not left her home in many years.     Action/Plan:   Pt to return back home via ambulance. HH needs and DME arranged   Anticipated DC Date:  02/14/2011   Anticipated DC Plan:  [place]  CM consult      Choice offered to / List presented to:          [facility] arranged  HH-1 RN  [place].   Status of service:  Completed, signed off Medicare Important Message given?   (If response is "NO", the following Medicare IM given date fields will be blank) Date Medicare IM given:   Date Additional Medicare IM given:    Discharge Disposition:  [place]  Per UR Regulation:    Comments:  02/14/11 [place] BSN CM Called by [facility], they do not accept pt insurance Location manager). Called by [facility] and pt last got a walker in July of 2010 therefore she is not eligible. Spoke with family, pt home safely and they agreed [facility] (who accepts insurance) will provide [facility] RN and PT. [place] notified.  02/14/11 1130 Ardel Jagger Dellia Nims RN BNS CM

## 2011-02-14 NOTE — Discharge Summary (Signed)
Physician Discharge Summary  Patient ID: Rachel Morgan MRN: [id number] DOB/AGE: 1950/02/14 61 y.o. Primary Care Physician:COMSTOCK,LLOYD, MD, MD Admit date: 02/11/2011 Discharge date: 02/14/2011    Discharge Diagnoses:  1. Pyelonephritis. 2. Acute renal failure, improving with intravenous fluids. 3. Cerebral palsy and mental retardation. Homebound. 4. Agoraphobia.   Medication List  As of 02/14/2011 10:58 AM   TAKE these medications         alprazolam 2 MG tablet   Commonly known as: XANAX   Take 1 mg by mouth 3 (three) times daily.      aspirin EC 81 MG tablet   Take 81 mg by mouth daily.      busPIRone 10 MG tablet   Commonly known as: BUSPAR   Take 10 mg by mouth daily.      chlorproMAZINE 25 MG tablet   Commonly known as: THORAZINE   Take 25 mg by mouth 2 (two) times daily.      ciprofloxacin 500 MG tablet   Commonly known as: CIPRO   Take 1 tablet (500 mg total) by mouth 2 (two) times daily.      escitalopram 20 MG tablet   Commonly known as: LEXAPRO   Take 20 mg by mouth at bedtime.      levothyroxine 25 MCG tablet   Commonly known as: SYNTHROID, LEVOTHROID   Take 25 mcg by mouth daily.      metFORMIN 500 MG tablet   Commonly known as: GLUCOPHAGE   Take 500 mg by mouth daily as needed. As needed if blood sugar levels are higher than normal      promethazine 25 MG tablet   Commonly known as: PHENERGAN   Take 25 mg by mouth every 6 (six) hours as needed. For nausea. **Take one tablet by mouth every 4 to 6 hours as needed for nausea**      spironolactone 50 MG tablet   Commonly known as: ALDACTONE   Take 50 mg by mouth 2 (two) times daily.      zolpidem 10 MG tablet   Commonly known as: AMBIEN   Take 10 mg by mouth at bedtime.            Discharged Condition: Stable and improved.    Consults: None.  Significant Diagnostic Studies: US Abdomen Complete  02/12/2011  *RADIOLOGY REPORT*  Clinical Data:  Abdominal pain.  COMPLETE ABDOMINAL  ULTRASOUND  Comparison:  None.  Findings:  Gallbladder:  Multiple echogenic foci are present.  There is shadowing.  The gallbladder wall thickness is within normal limits at 2.4 mm.  There is no sonographic Murphy's sign.  Common bile duct:  Normal in caliber. No biliary ductal dilation. The maximal diameter is 5.8 mm, within normal limits.  Liver:  There is diffuse increased echogenicity loss of normal internal echotexture.  No focal lesions are present.  IVC:  Appears normal.  Pancreas:  The pancreas is incompletely visualized due to body habitus.  Spleen:  Normal size and echotexture without focal parenchymal abnormality.  Right Kidney:  No hydronephrosis.  Well-preserved cortex.  Normal size and parenchymal echotexture without focal abnormalities.   The maximal length is 11.4 cm.  Left Kidney:  A linear echogenic focus is present at the lower pole of the left kidney.  This may represent a small stone.  There is no definite shadowing.  No hydronephrosis is evident.  No parenchymal lesions are seen.  Abdominal aorta:  The aorta is at the upper limits of normal for size,  2.9 cm.  IMPRESSION:  1.  Cholelithiasis without evidence for cholecystitis. 2.  Increased echogenicity of the liver and loss of internal echotexture is compatible with diffuse fatty infiltration. 3.  Linear echogenic focus at the lower pole of the left kidney may represent a nonobstructing stone.  Original Report Authenticated By: Resa Miner. MATTERN, M.D.   Dg Chest Port 1 View  02/12/2011  *RADIOLOGY REPORT*  Clinical Data: Shortness of breath, diabetes, hypertension  PORTABLE CHEST - 1 VIEW  Comparison: Portable exam 1155 hours compared to 02/11/2011  Findings: Image quality degraded due to body habitus. Enlargement of cardiac silhouette with pulmonary vascular congestion. Question minimal atelectasis at bases. No gross infiltrate or pleural effusion. No pneumothorax or acute bony findings.  IMPRESSION: Enlargement of cardiac silhouette  with pulmonary vascular congestion. Question minimal basilar atelectasis.  Original Report Authenticated By: Burnetta Sabin, M.D.   Dg Chest Port 1 View  02/11/2011  *RADIOLOGY REPORT*  Clinical Data: Fever.  Elevated white blood count.  PORTABLE CHEST - 1 VIEW  Comparison: None.  Findings: There is mild cardiomegaly.  Pulmonary vascularity is normal and the lungs are clear.  No osseous abnormality.  IMPRESSION: Mild cardiomegaly.  Original Report Authenticated By: Larey Seat, M.D.    Lab Results: Basic Metabolic Panel:  Basename 02/14/11 0458 02/13/11 0543  NA 137 136  K 4.3 4.2  CL 108 107  CO2 23 21  GLUCOSE 120* 122*  BUN 28* 34*  CREATININE 1.60* 2.01*  CALCIUM 8.7 9.1  MG -- --  PHOS -- --   Liver Function Tests:  [facility] 02/12/11 0445 02/11/11 1957  AST 19 25  ALT 15 18  ALKPHOS 88 101  BILITOT 0.4 0.5  PROT 6.7 7.6  ALBUMIN 2.3* 2.7*     CBC:  Basename 02/14/11 0458 02/13/11 0543 02/11/11 1957  WBC 13.3* 15.8* --  NEUTROABS 8.5* -- 19.9*  HGB 10.8* 11.2* --  HCT 33.1* 33.7* --  MCV 86.2 85.3 --  PLT 203 196 --    Recent Results (from the past 240 hour(s))  CULTURE, BLOOD (ROUTINE X 2)     Status: Normal (Preliminary result)   Collection Time   02/11/11  7:57 PM      Component Value Range Status Comment   Specimen Description LEFT ANTECUBITAL   Final    Special Requests BOTTLES DRAWN AEROBIC AND ANAEROBIC 5CC   Final    Culture NO GROWTH 2 DAYS   Final    Report Status PENDING   Incomplete   URINE CULTURE     Status: Normal   Collection Time   02/11/11  8:35 PM      Component Value Range Status Comment   Specimen Description URINE, CATHETERIZED   Final    Special Requests NONE   Final    Culture  Setup Time [id number]   Final    Colony Count NO GROWTH   Final    Culture NO GROWTH   Final    Report Status 02/12/2011 FINAL   Final   CULTURE, BLOOD (ROUTINE X 2)     Status: Normal (Preliminary result)   Collection Time   02/11/11  9:17 PM       Component Value Range Status Comment   Specimen Description LEFT ANTECUBITAL   Final    Special Requests BOTTLES DRAWN AEROBIC AND ANAEROBIC 5CC   Final    Culture NO GROWTH 2 DAYS   Final    Report Status PENDING   Incomplete  MRSA PCR SCREENING     Status: Normal   Collection Time   02/12/11 12:32 AM      Component Value Range Status Comment   MRSA by PCR NEGATIVE  NEGATIVE  Final      Hospital Course: This very pleasant 61 year old lady, who has cerebral palsy and mental retardation, presented with acute renal failure in the face of fevers, anorexia and abdominal pain. Physically she had pyelonephritis. She was treated with intravenous Rocephin and aggressive intravenous fluids. She made a good improvement with this. Ultrasound of the abdomen was done which showed the presence of cholelithiasis without evidence of cholecystitis. LFTs were normal. There is no hydronephrosis or obstruction to urine. Today she is significantly improved, according to family members at the bedside. They wished to take her home. Her creatinine is not normal but is significantly improved from admission. Her admission creatinine was 3.11 and today it is 1.6.  Discharge Exam: Blood pressure 114/69, pulse 64, temperature 97.7 F (36.5 C), temperature source Oral, resp. rate 20, height 5' (1.524 m), weight 142.8 kg (314 lb 13.1 oz), SpO2 90.00%. She looks systemically well. She does not appear to be toxic or septic. Heart sounds are present and normal. Lung fields are clear. She is alert and seems to be aware of her surroundings. Her abdomen is soft and does not appear to be tender.  Disposition: Home with family. She will be given a further 5 day course of ciprofloxacin empirically.  Discharge Orders    Future Orders Please Complete By Expires   Diet - low sodium heart healthy      Increase activity slowly         Follow-up Information    Follow up with COMSTOCK,LLOYD, MD .         SignedDoree Albee Pager [phone]  02/14/2011, 10:58 AM

## 2011-02-14 NOTE — Evaluation (Signed)
Physical Therapy Evaluation Patient Details Name: Rachel Morgan MRN: [id number] DOB: [birth year] Today's Date: 02/14/2011  Problem List:  Patient Active Problem List  Diagnoses  . Abdominal pain, right upper quadrant  . Pyelonephritis  . Mental retardation  . Agoraphobia  . Acute renal failure  . Hyponatremia  . Dehydration    Past Medical History:  Past Medical History  Diagnosis Date  . Diabetes mellitus   . Hypertension   . Thyroid disease   . Anxiety   . High cholesterol   . MR (mental retardation)    Past Surgical History: History reviewed. No pertinent past surgical history.  PT Assessment/Plan/Recommendation PT Assessment Clinical Impression Statement: pt very cooperative and able to follow simple directions...is moderately deconditioned from recent hospitalization and now needs assist  for transfers in and out of bed, needs a walker to stabilize gait...we checked her O2 sat on RA and it was 90% after exertion...sister reports that she sleeps in a regular bed at home and doesn't like to lie on her back.Marland KitchenMarland KitchenI am wondering if she would benefit from a hospital bed in order to elevate her upper body.Marland KitchenMarland KitchenShe will need a RW at d/c as well as HHPT for home functional eval PT Recommendation/Assessment: Patient will need skilled PT in the acute care venue PT Problem List: Decreased strength;Decreased activity tolerance;Decreased mobility;Decreased knowledge of use of DME;Decreased safety awareness;Obesity;Cardiopulmonary status limiting activity Barriers to Discharge: None PT Plan PT Frequency: Min 3X/week PT Treatment/Interventions: DME instruction;Gait training;Functional mobility training PT Recommendation Follow Up Recommendations: Home health PT Equipment Recommended: Rolling walker with 5" wheels PT Goals  Acute Rehab PT Goals PT Goal Formulation: With family Time For Goal Achievement: 2 weeks Pt will go Supine/Side to Sit: with min assist PT Goal: Supine/Side to Sit -  Progress: Goal set today Pt will go Sit to Supine/Side: with min assist Pt will Ambulate: 16 - 50 feet;with least restrictive assistive device;with min assist PT Goal: Ambulate - Progress: Goal set today  PT Evaluation Precautions/Restrictions  Precautions Precautions: Fall Precaution Comments: pt very deconditioned due to recent hospitalization Required Braces or Orthoses: No Restrictions Weight Bearing Restrictions: No Prior Functioning  Home Living Lives With: Family Receives Help From: Family Type of Home: House Home Layout: One level Home Access: Level entry Home Adaptive Equipment: Bedside commode/3-in-1 Prior Function Level of Independence: Independent with basic ADLs;Independent with gait;Independent with transfers Driving: No Vocation: Unemployed Cognition Cognition Arousal/Alertness: Awake/alert Overall Cognitive Status: History of cognitive impairments History of Cognitive Impairment: Appears at baseline functioning Orientation Level: Oriented to person Sensation/Coordination Sensation Light Touch: Appears Intact Stereognosis: Not tested Hot/Cold: Not tested Coordination Gross Motor Movements are Fluid and Coordinated: Yes Extremity Assessment RUE Assessment RUE Assessment: Within Functional Limits LUE Assessment LUE Assessment: Within Functional Limits RLE Assessment RLE Assessment: Within Functional Limits LLE Assessment LLE Assessment: Within Functional Limits Mobility (including Balance) Bed Mobility Bed Mobility: Yes Supine to Sit: 3: Mod assist Sit to Supine: 3: Mod assist Transfers Transfers: Yes Sit to Stand: 6: Modified independent (Device/Increase time) Stand to Sit: 6: Modified independent (Device/Increase time) Ambulation/Gait Ambulation/Gait: Yes Ambulation/Gait Assistance: 4: Min assist;7: Independent Ambulation/Gait Assistance Details (indicate cue type and reason): very unstable with no assistive device.Marland Kitchenlwith walker , only needs  assist to guide walker but tires quickly Ambulation Distance (Feet): 15 Feet Assistive device: Rolling walker Gait Pattern: Within Functional Limits Stairs: No Wheelchair Mobility Wheelchair Mobility: No  Posture/Postural Control Posture/Postural Control: No significant limitations Balance Balance Assessed: No Exercise    End of  Session PT - End of Session Equipment Utilized During Treatment: Gait belt Activity Tolerance: Patient tolerated treatment well;Patient limited by fatigue Patient left: in bed;with call bell in reach;with family/visitor present General Behavior During Session: [facility] for tasks performed Cognition: [facility] for tasks performed  Sable Feil 02/14/2011, 9:09 AM

## 2011-02-14 NOTE — Progress Notes (Signed)
CARE MANAGEMENT NOTE 02/14/2011  Patient:  SEVAN, BUFKIN   Account Number:  [id number]  Date Initiated:  02/14/2011  Documentation initiated by:  Claretha Cooper  Subjective/Objective Assessment:   Pt admitted with abdominal pain. PTA lived at home with family. Has not left her home in many years.     Action/Plan:   Pt to return back home via ambulance. HH needs and DME arranged   Anticipated DC Date:  02/14/2011   Anticipated DC Plan:  [place]  CM consult      Choice offered to / List presented to:     DME arranged  [place]      DME agency  [place] arranged  HH-1 RN  HH-2 PT  HH-10 DISEASE MANAGEMENT      [place] agency  [place]   Status of service:  Completed, signed off Medicare Important Message given?   (If response is "NO", the following Medicare IM given date fields will be blank) Date Medicare IM given:   Date Additional Medicare IM given:    Discharge Disposition:  [place]  Per UR Regulation:    Comments:  02/14/11 1130 Kenyada Dosch Dellia Nims RN BNS CM

## 2011-02-16 LAB — CULTURE, BLOOD (ROUTINE X 2)
Culture: NO GROWTH
Culture: NO GROWTH

## 2015-07-25 ENCOUNTER — Emergency Department (HOSPITAL_COMMUNITY): Payer: Medicare Other

## 2015-07-25 ENCOUNTER — Encounter (HOSPITAL_COMMUNITY): Payer: Self-pay | Admitting: *Deleted

## 2015-07-25 ENCOUNTER — Emergency Department (HOSPITAL_COMMUNITY)
Admission: EM | Admit: 2015-07-25 | Discharge: 2015-07-25 | Disposition: A | Discharge status: Home / Self Care | Payer: Medicare Other | Attending: Emergency Medicine | Admitting: Emergency Medicine

## 2015-07-25 DIAGNOSIS — Z7982 Long term (current) use of aspirin: Secondary | ICD-10-CM | POA: Insufficient documentation

## 2015-07-25 DIAGNOSIS — E119 Type 2 diabetes mellitus without complications: Secondary | ICD-10-CM | POA: Insufficient documentation

## 2015-07-25 DIAGNOSIS — K8020 Calculus of gallbladder without cholecystitis without obstruction: Secondary | ICD-10-CM | POA: Diagnosis not present

## 2015-07-25 DIAGNOSIS — Z79899 Other long term (current) drug therapy: Secondary | ICD-10-CM | POA: Insufficient documentation

## 2015-07-25 DIAGNOSIS — R109 Unspecified abdominal pain: Secondary | ICD-10-CM | POA: Diagnosis present

## 2015-07-25 DIAGNOSIS — I10 Essential (primary) hypertension: Secondary | ICD-10-CM | POA: Diagnosis not present

## 2015-07-25 DIAGNOSIS — N132 Hydronephrosis with renal and ureteral calculous obstruction: Secondary | ICD-10-CM | POA: Diagnosis not present

## 2015-07-25 LAB — URINALYSIS, ROUTINE W REFLEX MICROSCOPIC
Bilirubin Urine: NEGATIVE
Glucose, UA: NEGATIVE mg/dL
Hgb urine dipstick: NEGATIVE
Ketones, ur: NEGATIVE mg/dL
LEUKOCYTES UA: NEGATIVE
NITRITE: NEGATIVE
PH: 5.5 (ref 5.0–8.0)
Protein, ur: NEGATIVE mg/dL

## 2015-07-25 LAB — CBC WITH DIFFERENTIAL/PLATELET
BASOS PCT: 1 %
Basophils Absolute: 0.1 10*3/uL (ref 0.0–0.1)
Eosinophils Absolute: 0.2 10*3/uL (ref 0.0–0.7)
Eosinophils Relative: 3 %
HEMATOCRIT: 37.6 % (ref 36.0–46.0)
HEMOGLOBIN: 12.5 g/dL (ref 12.0–15.0)
LYMPHS ABS: 2.2 10*3/uL (ref 0.7–4.0)
Lymphocytes Relative: 31 %
MCH: 30.1 pg (ref 26.0–34.0)
MCHC: 33.2 g/dL (ref 30.0–36.0)
MCV: 90.6 fL (ref 78.0–100.0)
MONOS PCT: 6 %
Monocytes Absolute: 0.4 10*3/uL (ref 0.1–1.0)
NEUTROS ABS: 4.2 10*3/uL (ref 1.7–7.7)
NEUTROS PCT: 59 %
Platelets: 217 10*3/uL (ref 150–400)
RBC: 4.15 MIL/uL (ref 3.87–5.11)
RDW: 13.1 % (ref 11.5–15.5)
WBC: 7.1 10*3/uL (ref 4.0–10.5)

## 2015-07-25 LAB — COMPREHENSIVE METABOLIC PANEL
ALBUMIN: 3.8 g/dL (ref 3.5–5.0)
ALK PHOS: 76 U/L (ref 38–126)
ALT: 15 U/L (ref 14–54)
ANION GAP: 5 (ref 5–15)
AST: 19 U/L (ref 15–41)
BUN: 24 mg/dL — ABNORMAL HIGH (ref 6–20)
CHLORIDE: 109 mmol/L (ref 101–111)
CO2: 24 mmol/L (ref 22–32)
Calcium: 8.7 mg/dL — ABNORMAL LOW (ref 8.9–10.3)
Creatinine, Ser: 1.22 mg/dL — ABNORMAL HIGH (ref 0.44–1.00)
GFR calc Af Amer: 53 mL/min — ABNORMAL LOW (ref >60)
GFR calc non Af Amer: 46 mL/min — ABNORMAL LOW (ref >60)
GLUCOSE: 99 mg/dL (ref 65–99)
POTASSIUM: 4.7 mmol/L (ref 3.5–5.1)
SODIUM: 138 mmol/L (ref 135–145)
Total Bilirubin: 0.6 mg/dL (ref 0.3–1.2)
Total Protein: 7.4 g/dL (ref 6.5–8.1)

## 2015-07-25 LAB — LIPASE, BLOOD: Lipase: 15 U/L (ref 11–51)

## 2015-07-25 MED ORDER — LORAZEPAM 2 MG/ML IJ SOLN
INTRAMUSCULAR | Status: AC
  Filled 2015-07-25: qty 1

## 2015-07-25 MED ORDER — LORAZEPAM 1 MG PO TABS: 1.0000 mg | ORAL_TABLET | Freq: Once | ORAL | Status: DC

## 2015-07-25 MED ORDER — SODIUM CHLORIDE 0.9 % IV SOLN
INTRAVENOUS | Status: DC
  Administered 2015-07-25: 13:00:00 via INTRAVENOUS

## 2015-07-25 MED ORDER — LORAZEPAM 2 MG/ML IJ SOLN
1.0000 mg | Freq: Once | INTRAMUSCULAR | Status: AC
  Administered 2015-07-25: 1 mg via INTRAVENOUS

## 2015-07-25 MED ORDER — SODIUM CHLORIDE 0.9 % IV BOLUS (SEPSIS)
250.0000 mL | Freq: Once | INTRAVENOUS | Status: AC
  Administered 2015-07-25: 250 mL via INTRAVENOUS

## 2015-07-25 NOTE — ED Notes (Signed)
Pt was last given food last night around 2030.

## 2015-07-25 NOTE — ED Notes (Signed)
MD at bedside. 

## 2015-07-25 NOTE — Discharge Instructions (Signed)
°  Patient will need follow-up for consideration of gallbladder removal by Dr. Arnoldo Morale. Make an appointment. But more important make an appointment to follow-up with urology here in [place] vertically for the large ureteral stone in the left the kidney area that's causing some blockage of the ureter. Return for any new or worse symptoms. Motrin in the meantime will be fine. Today's labs without any significant abnormalities. No evidence of gallbladder infection. No evidence of kidney infection.

## 2015-07-25 NOTE — ED Notes (Signed)
Pt has results of [place] at bedside.   Results show a 3 cm left kidney stone.

## 2015-07-25 NOTE — ED Notes (Signed)
Pt given drink with approval from MD

## 2015-07-25 NOTE — ED Notes (Addendum)
Pt comes in by EMS for flank pain. Pt has at home health which scheduled an [place]. This found that patient had gallstones and a large kidney stone. Pt has sister at bedside who does the communication for her.   Pt was given 20 mg Zyprexa and 2 mg Xanax before arrival.

## 2015-07-25 NOTE — ED Provider Notes (Signed)
CSN: [id number]     Arrival date & time 07/25/15  1048 History  By signing my name below, I, Rachel Morgan, attest that this documentation has been prepared under the direction and in the presence of Fredia Sorrow, MD. Electronically Signed: Hansel Morgan, ED Scribe. 07/25/2015. 11:46 AM.     Chief Complaint  Patient presents with  . Flank Pain   LEVEL 5 CAVEAT: HPI and ROS limited due to MR  Patient is a 65 y.o. female presenting with flank pain. The history is provided by the patient and a relative. The history is limited by a developmental delay. No language interpreter was used.  Flank Pain This is a new problem. The current episode started more than 2 days ago. The problem occurs constantly. The problem has not changed since onset.Nothing aggravates the symptoms. Nothing relieves the symptoms. She has tried nothing for the symptoms. The treatment provided no relief.   HPI Comments: Rachel Morgan is a 65 y.o. female with h/o DM, HTN, MR who presents to the Emergency Department complaining of moderate, ongoing, right flank pain. Per family, the pt is unable to express when her pain began and describe the pain to them. Pt had an US done at home 3 days ago for evaluation of her flank pain, with findings of renal calculi and gallstones. Pt is ambulatory at baseline and receives home health care. Family also reports the pt complains of pain to the ball of the left foot, where the foot is callused. Family denies emesis, fever, chills.    Past Medical History  Diagnosis Date  . Diabetes mellitus   . Hypertension   . Thyroid disease   . Anxiety   . High cholesterol   . MR (mental retardation)    History reviewed. No pertinent past surgical history. No family history on file. Social History  Substance Use Topics  . Smoking status: Never Smoker   . Smokeless tobacco: None  . Alcohol Use: No   OB History    No data available     Review of Systems  Reason unable to perform ROS: MR.    Allergies  Review of patient's allergies indicates no known allergies.  Home Medications   Prior to Admission medications   Medication Sig Start Date End Date Taking? Authorizing Provider  ALPRAZolam Duanne Moron) 1 MG tablet Take 1 mg by mouth 2 (two) times daily as needed for anxiety.   Yes Historical Provider, MD  aspirin EC 81 MG tablet Take 81 mg by mouth daily.   Yes Historical Provider, MD  busPIRone (BUSPAR) 10 MG tablet Take 10 mg by mouth daily.   Yes Historical Provider, MD  chlorproMAZINE (THORAZINE) 25 MG tablet Take 25 mg by mouth 3 (three) times daily.    Yes Historical Provider, MD  escitalopram (LEXAPRO) 20 MG tablet Take 20 mg by mouth at bedtime.   Yes Historical Provider, MD  levothyroxine (SYNTHROID, LEVOTHROID) 50 MCG tablet Take 50 mcg by mouth daily before breakfast.   Yes Historical Provider, MD  OLANZapine (ZYPREXA) 20 MG tablet Take 20 mg by mouth daily.  07/24/15  Yes Historical Provider, MD  omeprazole (PRILOSEC) 20 MG capsule Take 20 mg by mouth daily.   Yes Historical Provider, MD  oxybutynin (DITROPAN-XL) 10 MG 24 hr tablet Take 10 mg by mouth at bedtime.   Yes Historical Provider, MD  spironolactone (ALDACTONE) 50 MG tablet Take 50 mg by mouth 2 (two) times daily.   Yes Historical Provider, MD  zolpidem Lorrin Mais)  10 MG tablet Take 10 mg by mouth at bedtime.   Yes Historical Provider, MD   BP 138/61 mmHg  Pulse 66  Temp(Src) 97.6 F (36.4 C) (Oral)  Resp 17  SpO2 98% Physical Exam  Constitutional: She appears well-developed and well-nourished.  HENT:  Head: Normocephalic.  Slightly dry mucous membranes  Eyes: Conjunctivae and EOM are normal. Pupils are equal, round, and reactive to light. No scleral icterus.  Cardiovascular: Normal rate, regular rhythm and normal heart sounds.   Pulmonary/Chest: Effort normal and breath sounds normal. No respiratory distress. She has no wheezes. She has no rales.  RA spO2 98%  Abdominal: Soft. Bowel sounds are normal. She  exhibits no distension. There is no tenderness.  Musculoskeletal: Normal range of motion. She exhibits edema.  Trace pitting edema to BLE. Calluses to plantar aspect of both feet, (L>R). Cap refill >2 seconds.   Neurological: She is alert. No cranial nerve deficit. Coordination normal.  Skin: Skin is warm and dry.  Psychiatric: She has a normal mood and affect. Her behavior is normal.  Nursing note and vitals reviewed.   ED Course  Procedures (including critical care time) DIAGNOSTIC STUDIES: Oxygen Saturation is 97% on RA, normal by my interpretation.    COORDINATION OF CARE: 11:45 AM Discussed treatment plan with family at bedside which includes CT and they agreed to plan.   Labs Review Labs Reviewed  COMPREHENSIVE METABOLIC PANEL - Abnormal; Notable for the following:    BUN 24 (*)    Creatinine, Ser 1.22 (*)    Calcium 8.7 (*)    GFR calc non Af Amer 46 (*)    GFR calc Af Amer 53 (*)    All other components within normal limits  URINALYSIS, ROUTINE W REFLEX MICROSCOPIC (NOT AT [facility]) - Abnormal; Notable for the following:    Color, Urine STRAW (*)    Specific Gravity, Urine <1.005 (*)    All other components within normal limits  LIPASE, BLOOD  CBC WITH DIFFERENTIAL/PLATELET   Results for orders placed or performed during the hospital encounter of 07/25/15  Comprehensive metabolic panel  Result Value Ref Range   Sodium 138 135 - 145 mmol/L   Potassium 4.7 3.5 - 5.1 mmol/L   Chloride 109 101 - 111 mmol/L   CO2 24 22 - 32 mmol/L   Glucose, Bld 99 65 - 99 mg/dL   BUN 24 (H) 6 - 20 mg/dL   Creatinine, Ser 1.22 (H) 0.44 - 1.00 mg/dL   Calcium 8.7 (L) 8.9 - 10.3 mg/dL   Total Protein 7.4 6.5 - 8.1 g/dL   Albumin 3.8 3.5 - 5.0 g/dL   AST 19 15 - 41 U/L   ALT 15 14 - 54 U/L   Alkaline Phosphatase 76 38 - 126 U/L   Total Bilirubin 0.6 0.3 - 1.2 mg/dL   GFR calc non Af Amer 46 (L) >60 mL/min   GFR calc Af Amer 53 (L) >60 mL/min   Anion gap 5 5 - 15  Lipase, blood   Result Value Ref Range   Lipase 15 11 - 51 U/L  Urinalysis, Routine w reflex microscopic (not at [facility])  Result Value Ref Range   Color, Urine STRAW (A) YELLOW   APPearance CLEAR CLEAR   Specific Gravity, Urine <1.005 (L) 1.005 - 1.030   pH 5.5 5.0 - 8.0   Glucose, UA NEGATIVE NEGATIVE mg/dL   Hgb urine dipstick NEGATIVE NEGATIVE   Bilirubin Urine NEGATIVE NEGATIVE   Ketones, ur NEGATIVE  NEGATIVE mg/dL   Protein, ur NEGATIVE NEGATIVE mg/dL   Nitrite NEGATIVE NEGATIVE   Leukocytes, UA NEGATIVE NEGATIVE  CBC with Differential/Platelet  Result Value Ref Range   WBC 7.1 4.0 - 10.5 K/uL   RBC 4.15 3.87 - 5.11 MIL/uL   Hemoglobin 12.5 12.0 - 15.0 g/dL   HCT 37.6 36.0 - 46.0 %   MCV 90.6 78.0 - 100.0 fL   MCH 30.1 26.0 - 34.0 pg   MCHC 33.2 30.0 - 36.0 g/dL   RDW 13.1 11.5 - 15.5 %   Platelets 217 150 - 400 K/uL   Neutrophils Relative % 59 %   Neutro Abs 4.2 1.7 - 7.7 K/uL   Lymphocytes Relative 31 %   Lymphs Abs 2.2 0.7 - 4.0 K/uL   Monocytes Relative 6 %   Monocytes Absolute 0.4 0.1 - 1.0 K/uL   Eosinophils Relative 3 %   Eosinophils Absolute 0.2 0.0 - 0.7 K/uL   Basophils Relative 1 %   Basophils Absolute 0.1 0.0 - 0.1 K/uL    Imaging Review Ct Renal Stone Study  07/25/2015  CLINICAL DATA:  65 year old diabetic hypertensive female with right flank pain for the past 3 days. Initial encounter. EXAM: CT ABDOMEN AND PELVIS WITHOUT CONTRAST TECHNIQUE: Multidetector CT imaging of the abdomen and pelvis was performed following the standard protocol without IV contrast. COMPARISON:  Abdominal ultrasound 02/12/2011.  No comparison CT. FINDINGS: Lower chest: Probable small region of subsegmental atelectasis/scarring right lung base (series 4, image 83). Cardiomegaly. Coronary artery calcifications. Hepatobiliary: Multiple gallstones. Gallstones in the gallbladder neck and possibly within the proximal common bile duct. Evaluation limited by motion degradation. If cholecystitis were of  clinical concern ultrasound may be considered. Pancreas: Fatty replacement without mass identified. Spleen: Unenhanced imaging without abnormality noted. Adrenals/Urinary Tract: Stones within the right renal pelvis/proximal right ureter spanning over 2.3 cm. The right renal collecting system does not appear dilated. There are smaller nonobstructing right renal calculi. Proximal left ureteral stone spans over 1.4 cm. Moderate-to-marked left hydronephrosis with renal parenchymal thinning suggesting there may be a component of long-standing obstruction. Lower pole nonobstructing left renal calculi measuring up to 1.3 cm. Noncontrast filled views of the urinary bladder without stone identified. Bladder is prominent size. Stomach/Bowel: Small hiatal hernia. No extra luminal bowel inflammatory process, free fluid or free air. Vascular/Lymphatic: Minimal aortic calcification with slight ectasia. No adenopathy. Reproductive: No worrisome mass noted by CT. Other: Negative. Musculoskeletal: Degenerative changes lower thoracic lumbar spine most prominent L3-4 through L5-S1. IMPRESSION: Stones within the right renal pelvis/proximal right ureter spanning over 2.3 cm. The right renal collecting system does not appear dilated. There are smaller nonobstructing right renal calculi. Proximal left ureteral stone spans over 1.4 cm. Moderate-to-marked left hydronephrosis with renal parenchymal thinning suggesting there may be a component of long-standing obstruction. Lower pole nonobstructing left renal calculi measuring up to 1.3 cm. Multiple gallstones. Gallstones in the gallbladder neck and possibly within the proximal common bile duct. Evaluation limited by motion degradation. If cholecystitis were of clinical concern ultrasound may be considered. Degenerative changes lower lumbar spine. Electronically Signed   By: Genia Del M.D.   On: 07/25/2015 13:36   I have personally reviewed and evaluated these images and lab results as  part of my medical decision-making.   EKG Interpretation None      MDM   Final diagnoses:  Calculus of gallbladder without cholecystitis without obstruction  Ureteral stone with hydronephrosis    Patient with a history of the valve  mild delay and MR. Brought in by family members. Patient been complaining of some right-sided abdominal pain but there's been no nausea vomiting is been no fevers. Patient is from [facility]. Had a bedside ultrasound done that raised appropriate concerns for gallstones as well as kidney stones. However patient's been fairly asymptomatic.  Workup here today showed no evidence of any significant lab abnormalities. Renal functions better than it was in 2013 liver function tests were normal. No leukocytosis. Nothing to support acute cholecystitis. Also patient without any tenderness. Nothing to support prolonged biliary colic. Patient without any specific complaints here at all today. In addition of renal function is better as stated and CT showed significant stones in both kidneys. With a large stone 1.4 cm in the left proximal ureter area with hydronephrosis. This will require follow-up with urology. In addition patient probably should be evaluated by general surgery on an outpatient basis to determine whether due to her mental status whether elective cholecystectomy may be appropriate at this time. Not able to clearly delineate whether the gallbladder is a cause of any symptoms but patient not able to provide much information  I personally performed the services described in this documentation, which was scribed in my presence. The recorded information has been reviewed and is accurate.     Fredia Sorrow, MD 07/25/15 1505

## 2015-07-25 NOTE — ED Notes (Signed)
EMS at bedside

## 2015-09-14 ENCOUNTER — Observation Stay (HOSPITAL_COMMUNITY)
Admission: EM | Admit: 2015-09-14 | Discharge: 2015-09-17 | Disposition: A | Discharge status: Home / Self Care | Payer: Medicare Other | Attending: Internal Medicine | Admitting: Internal Medicine

## 2015-09-14 ENCOUNTER — Encounter (HOSPITAL_COMMUNITY): Payer: Self-pay

## 2015-09-14 ENCOUNTER — Emergency Department (HOSPITAL_COMMUNITY): Payer: Medicare Other

## 2015-09-14 DIAGNOSIS — K838 Other specified diseases of biliary tract: Secondary | ICD-10-CM | POA: Diagnosis present

## 2015-09-14 DIAGNOSIS — F418 Other specified anxiety disorders: Secondary | ICD-10-CM | POA: Diagnosis not present

## 2015-09-14 DIAGNOSIS — N135 Crossing vessel and stricture of ureter without hydronephrosis: Secondary | ICD-10-CM | POA: Diagnosis present

## 2015-09-14 DIAGNOSIS — N183 Chronic kidney disease, stage 3 unspecified: Secondary | ICD-10-CM | POA: Diagnosis present

## 2015-09-14 DIAGNOSIS — I129 Hypertensive chronic kidney disease with stage 1 through stage 4 chronic kidney disease, or unspecified chronic kidney disease: Secondary | ICD-10-CM | POA: Diagnosis not present

## 2015-09-14 DIAGNOSIS — K8020 Calculus of gallbladder without cholecystitis without obstruction: Principal | ICD-10-CM | POA: Diagnosis present

## 2015-09-14 DIAGNOSIS — E1122 Type 2 diabetes mellitus with diabetic chronic kidney disease: Secondary | ICD-10-CM | POA: Insufficient documentation

## 2015-09-14 DIAGNOSIS — Z7982 Long term (current) use of aspirin: Secondary | ICD-10-CM | POA: Insufficient documentation

## 2015-09-14 DIAGNOSIS — I10 Essential (primary) hypertension: Secondary | ICD-10-CM | POA: Insufficient documentation

## 2015-09-14 DIAGNOSIS — N132 Hydronephrosis with renal and ureteral calculous obstruction: Secondary | ICD-10-CM | POA: Diagnosis not present

## 2015-09-14 DIAGNOSIS — R1011 Right upper quadrant pain: Secondary | ICD-10-CM | POA: Diagnosis present

## 2015-09-14 DIAGNOSIS — F79 Unspecified intellectual disabilities: Secondary | ICD-10-CM

## 2015-09-14 DIAGNOSIS — R109 Unspecified abdominal pain: Secondary | ICD-10-CM

## 2015-09-14 DIAGNOSIS — F4000 Agoraphobia, unspecified: Secondary | ICD-10-CM | POA: Diagnosis not present

## 2015-09-14 DIAGNOSIS — E039 Hypothyroidism, unspecified: Secondary | ICD-10-CM | POA: Diagnosis present

## 2015-09-14 DIAGNOSIS — Z79899 Other long term (current) drug therapy: Secondary | ICD-10-CM | POA: Insufficient documentation

## 2015-09-14 DIAGNOSIS — N200 Calculus of kidney: Secondary | ICD-10-CM

## 2015-09-14 DIAGNOSIS — K219 Gastro-esophageal reflux disease without esophagitis: Secondary | ICD-10-CM | POA: Diagnosis present

## 2015-09-14 HISTORY — DX: Calculus of kidney: N20.0

## 2015-09-14 HISTORY — DX: Calculus of gallbladder without cholecystitis without obstruction: K80.20

## 2015-09-14 LAB — COMPREHENSIVE METABOLIC PANEL
ALT: 20 U/L (ref 14–54)
ANION GAP: 11 (ref 5–15)
AST: 22 U/L (ref 15–41)
Albumin: 4.3 g/dL (ref 3.5–5.0)
Alkaline Phosphatase: 82 U/L (ref 38–126)
BUN: 26 mg/dL — ABNORMAL HIGH (ref 6–20)
CHLORIDE: 107 mmol/L (ref 101–111)
CO2: 21 mmol/L — AB (ref 22–32)
Calcium: 9.5 mg/dL (ref 8.9–10.3)
Creatinine, Ser: 1.37 mg/dL — ABNORMAL HIGH (ref 0.44–1.00)
GFR, EST AFRICAN AMERICAN: 46 mL/min — AB (ref >60)
GFR, EST NON AFRICAN AMERICAN: 40 mL/min — AB (ref >60)
Glucose, Bld: 123 mg/dL — ABNORMAL HIGH (ref 65–99)
POTASSIUM: 4 mmol/L (ref 3.5–5.1)
SODIUM: 139 mmol/L (ref 135–145)
Total Bilirubin: 0.4 mg/dL (ref 0.3–1.2)
Total Protein: 8 g/dL (ref 6.5–8.1)

## 2015-09-14 LAB — CBC
HEMATOCRIT: 39.9 % (ref 36.0–46.0)
HEMOGLOBIN: 13.3 g/dL (ref 12.0–15.0)
MCH: 30.3 pg (ref 26.0–34.0)
MCHC: 33.3 g/dL (ref 30.0–36.0)
MCV: 90.9 fL (ref 78.0–100.0)
Platelets: 214 10*3/uL (ref 150–400)
RBC: 4.39 MIL/uL (ref 3.87–5.11)
RDW: 13.3 % (ref 11.5–15.5)
WBC: 7.3 10*3/uL (ref 4.0–10.5)

## 2015-09-14 LAB — URINALYSIS, ROUTINE W REFLEX MICROSCOPIC
Bilirubin Urine: NEGATIVE
GLUCOSE, UA: NEGATIVE mg/dL
HGB URINE DIPSTICK: NEGATIVE
Ketones, ur: NEGATIVE mg/dL
LEUKOCYTES UA: NEGATIVE
Nitrite: NEGATIVE
PROTEIN: NEGATIVE mg/dL
pH: 5.5 (ref 5.0–8.0)

## 2015-09-14 LAB — LIPASE, BLOOD: LIPASE: 12 U/L (ref 11–51)

## 2015-09-14 MED ORDER — SODIUM CHLORIDE 0.9 % IV BOLUS (SEPSIS)
500.0000 mL | Freq: Once | INTRAVENOUS | Status: AC
  Administered 2015-09-14: 500 mL via INTRAVENOUS

## 2015-09-14 MED ORDER — OLANZAPINE 10 MG PO TABS
20.0000 mg | ORAL_TABLET | Freq: Every day | ORAL | Status: DC
  Administered 2015-09-15 – 2015-09-17 (×3): 20 mg via ORAL
  Filled 2015-09-14 (×3): qty 2
  Filled 2015-09-14: qty 4
  Filled 2015-09-14: qty 2

## 2015-09-14 MED ORDER — OXYBUTYNIN CHLORIDE ER 10 MG PO TB24
10.0000 mg | ORAL_TABLET | Freq: Every morning | ORAL | Status: DC
  Administered 2015-09-15 – 2015-09-17 (×3): 10 mg via ORAL
  Filled 2015-09-14: qty 1
  Filled 2015-09-14: qty 2
  Filled 2015-09-14: qty 1

## 2015-09-14 MED ORDER — ONDANSETRON HCL 4 MG PO TABS: 4.0000 mg | ORAL_TABLET | Freq: Four times a day (QID) | ORAL | Status: DC | PRN

## 2015-09-14 MED ORDER — LEVOTHYROXINE SODIUM 50 MCG PO TABS
50.0000 ug | ORAL_TABLET | Freq: Every day | ORAL | Status: DC
  Administered 2015-09-15 – 2015-09-17 (×3): 50 ug via ORAL
  Filled 2015-09-14 (×3): qty 1

## 2015-09-14 MED ORDER — CHLORTHALIDONE 25 MG PO TABS
ORAL_TABLET | ORAL | Status: AC
  Filled 2015-09-14: qty 1

## 2015-09-14 MED ORDER — ACETAMINOPHEN 325 MG PO TABS: 650.0000 mg | ORAL_TABLET | Freq: Four times a day (QID) | ORAL | Status: DC | PRN

## 2015-09-14 MED ORDER — ZOLPIDEM TARTRATE 5 MG PO TABS
5.0000 mg | ORAL_TABLET | Freq: Every day | ORAL | Status: DC
  Administered 2015-09-14 – 2015-09-16 (×3): 5 mg via ORAL
  Filled 2015-09-14 (×3): qty 1

## 2015-09-14 MED ORDER — PANTOPRAZOLE SODIUM 40 MG PO TBEC
40.0000 mg | DELAYED_RELEASE_TABLET | Freq: Every day | ORAL | Status: DC
  Administered 2015-09-15 – 2015-09-17 (×3): 40 mg via ORAL
  Filled 2015-09-14 (×3): qty 1

## 2015-09-14 MED ORDER — ESCITALOPRAM OXALATE 20 MG PO TABS
20.0000 mg | ORAL_TABLET | Freq: Every day | ORAL | Status: DC
  Administered 2015-09-14 – 2015-09-16 (×3): 20 mg via ORAL
  Filled 2015-09-14 (×2): qty 1
  Filled 2015-09-14: qty 2

## 2015-09-14 MED ORDER — TRAMADOL HCL 50 MG PO TABS
50.0000 mg | ORAL_TABLET | Freq: Four times a day (QID) | ORAL | Status: DC | PRN
  Administered 2015-09-15 – 2015-09-17 (×3): 50 mg via ORAL
  Filled 2015-09-14 (×3): qty 1

## 2015-09-14 MED ORDER — CHLORPROMAZINE HCL 25 MG PO TABS
25.0000 mg | ORAL_TABLET | Freq: Two times a day (BID) | ORAL | Status: DC
  Administered 2015-09-15 – 2015-09-17 (×5): 25 mg via ORAL
  Filled 2015-09-14 (×13): qty 1

## 2015-09-14 MED ORDER — ALPRAZOLAM 1 MG PO TABS
1.0000 mg | ORAL_TABLET | Freq: Two times a day (BID) | ORAL | Status: DC
  Administered 2015-09-14 – 2015-09-17 (×6): 1 mg via ORAL
  Filled 2015-09-14 (×6): qty 1

## 2015-09-14 MED ORDER — ONDANSETRON HCL 4 MG/2ML IJ SOLN: 4.0000 mg | Freq: Four times a day (QID) | INTRAMUSCULAR | Status: DC | PRN

## 2015-09-14 MED ORDER — HEPARIN SODIUM (PORCINE) 5000 UNIT/ML IJ SOLN
5000.0000 [IU] | Freq: Three times a day (TID) | INTRAMUSCULAR | Status: DC
  Administered 2015-09-14 – 2015-09-17 (×6): 5000 [IU] via SUBCUTANEOUS
  Filled 2015-09-14 (×6): qty 1

## 2015-09-14 MED ORDER — FENTANYL CITRATE (PF) 100 MCG/2ML IJ SOLN: 12.5000 ug | INTRAMUSCULAR | Status: DC | PRN

## 2015-09-14 MED ORDER — POLYETHYLENE GLYCOL 3350 17 G PO PACK
17.0000 g | PACK | Freq: Every day | ORAL | Status: DC | PRN
  Administered 2015-09-16: 17 g via ORAL
  Filled 2015-09-14: qty 1

## 2015-09-14 MED ORDER — ACETAMINOPHEN 650 MG RE SUPP: 650.0000 mg | Freq: Four times a day (QID) | RECTAL | Status: DC | PRN

## 2015-09-14 MED ORDER — SODIUM CHLORIDE 0.9 % IV SOLN
INTRAVENOUS | Status: AC
  Administered 2015-09-14: 22:00:00 via INTRAVENOUS

## 2015-09-14 MED ORDER — BISACODYL 5 MG PO TBEC: 5.0000 mg | DELAYED_RELEASE_TABLET | Freq: Every day | ORAL | Status: DC | PRN

## 2015-09-14 MED ORDER — BUSPIRONE HCL 5 MG PO TABS
10.0000 mg | ORAL_TABLET | Freq: Every morning | ORAL | Status: DC
  Administered 2015-09-15 – 2015-09-17 (×3): 10 mg via ORAL
  Filled 2015-09-14 (×3): qty 2

## 2015-09-14 NOTE — ED Provider Notes (Signed)
[facility] DEPT Provider Note   CSN: [id number] Arrival date & time: 09/14/15  1645     History   Chief Complaint Chief Complaint  Patient presents with  . Abdominal Pain    HPI Rachel Morgan is a 65 y.o. female.  Level V caveat for mental retardation. Patient has known gallstones and kidney stones on the left side. Apparently her abdominal pain has worsened over the past month, especially over the past 2 weeks. She has been eating less. No fever, chills, vomiting or diarrhea. Her primary care physician at [facility] family medicine allegedly sent her to the ED to be evaluated by a general surgeon and a urologist.      Past Medical History:  Diagnosis Date  . Anxiety   . Diabetes mellitus   . Gall stones   . High cholesterol   . Hypertension   . Kidney stones   . Kidney stones   . MR (mental retardation)   . Thyroid disease     Patient Active Problem List   Diagnosis Date Noted  . Abdominal pain, right upper quadrant 02/11/2011  . Pyelonephritis 02/11/2011  . Mental retardation 02/11/2011  . Agoraphobia 02/11/2011  . Acute renal failure ([place]) 02/11/2011  . Hyponatremia 02/11/2011  . Dehydration 02/11/2011    History reviewed. No pertinent surgical history.  OB History    No data available       Home Medications    Prior to Admission medications   Medication Sig Start Date End Date Taking? Authorizing Provider  ALPRAZolam Duanne Moron) 1 MG tablet Take 1 mg by mouth 2 (two) times daily.    Yes Historical Provider, MD  aspirin EC 81 MG tablet Take 81 mg by mouth every morning.    Yes Historical Provider, MD  busPIRone (BUSPAR) 10 MG tablet Take 10 mg by mouth every morning.    Yes Historical Provider, MD  chlorproMAZINE (THORAZINE) 25 MG tablet Take 25 mg by mouth 2 (two) times daily.    Yes Historical Provider, MD  escitalopram (LEXAPRO) 20 MG tablet Take 20 mg by mouth at bedtime.   Yes Historical Provider, MD  levothyroxine (SYNTHROID, LEVOTHROID) 50 MCG  tablet Take 50 mcg by mouth daily before breakfast.   Yes Historical Provider, MD  OLANZapine (ZYPREXA) 20 MG tablet Take 20 mg by mouth daily.  07/24/15  Yes Historical Provider, MD  omeprazole (PRILOSEC) 20 MG capsule Take 20 mg by mouth every morning.    Yes Historical Provider, MD  oxybutynin (DITROPAN-XL) 10 MG 24 hr tablet Take 10 mg by mouth every morning.    Yes Historical Provider, MD  spironolactone (ALDACTONE) 50 MG tablet Take 50 mg by mouth 2 (two) times daily.   Yes Historical Provider, MD  traMADol (ULTRAM) 50 MG tablet Take 50 mg by mouth every 6 (six) hours as needed for severe pain.  09/09/15  Yes Historical Provider, MD  zolpidem (AMBIEN) 10 MG tablet Take 10 mg by mouth at bedtime.   Yes Historical Provider, MD    Family History No family history on file.  Social History Social History  Substance Use Topics  . Smoking status: Never Smoker  . Smokeless tobacco: Never Used  . Alcohol use No     Allergies   Penicillins   Review of Systems Review of Systems  Reason unable to perform ROS: Mental retardation.     Physical Exam Updated Vital Signs BP 151/65 (BP Location: Left Arm)   Pulse 73   Temp 97.7 F (36.5  C) (Oral)   Ht 5' (1.524 m)   Wt 270 lb (122.5 kg)   SpO2 100%   BMI 52.73 kg/m   Physical Exam  Constitutional: She is oriented to person, place, and time.  Unable to answer questions secondary to intelligence level  HENT:  Head: Normocephalic and atraumatic.  Eyes: Conjunctivae are normal.  Neck: Neck supple.  Cardiovascular: Normal rate and regular rhythm.   Pulmonary/Chest: Effort normal and breath sounds normal.  Abdominal: Soft. Bowel sounds are normal.  Minimal right upper quadrant tenderness  Musculoskeletal: Normal range of motion.  Neurological: She is alert and oriented to person, place, and time.  Skin: Skin is warm and dry.  Psychiatric: She has a normal mood and affect. Her behavior is normal.  Nursing note and vitals  reviewed.    ED Treatments / Results  Labs (all labs ordered are listed, but only abnormal results are displayed) Labs Reviewed  COMPREHENSIVE METABOLIC PANEL - Abnormal; Notable for the following:       Result Value   CO2 21 (*)    Glucose, Bld 123 (*)    BUN 26 (*)    Creatinine, Ser 1.37 (*)    GFR calc non Af Amer 40 (*)    GFR calc Af Amer 46 (*)    All other components within normal limits  URINALYSIS, ROUTINE W REFLEX MICROSCOPIC (NOT AT [facility]) - Abnormal; Notable for the following:    Specific Gravity, Urine >1.030 (*)    All other components within normal limits  LIPASE, BLOOD  CBC    EKG  EKG Interpretation None       Radiology Ct Abdomen Pelvis Wo Contrast  Result Date: 09/14/2015 CLINICAL DATA:  Pt sent from home referred by [place] [facility] due to pain in RUQ for one week. Family reports that she has loss of appetite and constipation. EXAM: CT ABDOMEN AND PELVIS WITHOUT CONTRAST TECHNIQUE: Multidetector CT imaging of the abdomen and pelvis was performed following the standard protocol without IV contrast. COMPARISON:  07/25/2015 FINDINGS: Despite efforts by the technologist and patient, motion artifact is present on today's exam and could not be eliminated. This reduces exam sensitivity and specificity. Lower chest: Coronary artery atherosclerosis. Mild enlargement of the cardiopericardial silhouette gas-filled distal esophagus, significance uncertain. Hepatobiliary: Numerous gallstones in the gallbladder measuring up to approximately 0.8 cm. Punctate calcification in segment 5 of the liver, image 26/2, unchanged, likely due to remote inflammation. Mild chronic prominence of the extrahepatic biliary tree at 10 mm diameter. Pancreas: Diffuse fatty atrophy of the pancreas. Spleen: Unremarkable Adrenals/Urinary Tract: Adrenal glands normal. Bilateral nonobstructive nephrolithiasis. On the left side there is prominent cortical thinning, hydronephrosis, and a 1.7 cm in  long axis UPJ stone which is likely obstructive. The thinning of the cortex suggests chronic obstruction with resulting renal atrophy. The previous left renal pelvis calculus appears to of migrated back into the lower pole of the left kidney along with several other calculi in this vicinity. No ureteral or bladder calculus is seen. Stomach/Bowel: There is prominence in stool in the proximal 2/3 of the colon. Appendix normal. Vascular/Lymphatic: Mild aortoiliac atherosclerotic vascular disease. Small periaortic lymph nodes observed. No overtly pathologic adenopathy observed. Reproductive: Unremarkable Other: No supplemental non-categorized findings. Musculoskeletal: Abdominal wall laxity. Generalized muscular atrophy for age. Chronic calcifications in both distal iliopsoas tendons. Exaggerated lumbar lordosis with grade 1 degenerative anterolisthesis at L4-5, and lumbar spondylosis and degenerative disc disease causing impingement at L3-4, L4-5, and L5-S1. IMPRESSION: 1. Mild dilatation  of the CBD, at about 10 mm. This is stable from 07/25/2015 but increased from 02/12/2011. I do not directly see choledocholithiasis although CT is relatively insensitive for such, especially with the degree of motion artifact shown on today's exam. 2. Bilateral renal calculi. 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 3. Mild constipation with prominence of stool in the proximal 2/3 of the colon. 4. Coronary and aortoiliac atherosclerosis.  Mild cardiomegaly. 5. Cholelithiasis. 6. Generalized muscular atrophy. 7. Lumbar spondylosis and degenerative disc disease causing impingement at L3-4, L4-5, and L5-S1. Electronically Signed   By: Van Clines M.D.   On: 09/14/2015 18:49    Procedures Procedures (including critical care time)  Medications  Ordered in ED Medications  sodium chloride 0.9 % bolus 500 mL (0 mLs Intravenous Stopped 09/14/15 1847)     Initial Impression / Assessment and Plan / ED Course  I have reviewed the triage vital signs and the nursing notes.  Pertinent labs & imaging results that were available during my care of the patient were reviewed by me and considered in my medical decision making (see chart for details).  Clinical Course    No acute abdomen noted. Discussed clinical scenario with general surgeon Dr. Aviva Signs and urologist Dr.Eskridege.  Will admit to general medicine.  Final Clinical Impressions(s) / ED Diagnoses   Final diagnoses:  Calculus of gallbladder without cholecystitis without obstruction  Kidney stone on left side    New Prescriptions New Prescriptions   No medications on file     Nat Christen, MD 09/14/15 2114

## 2015-09-14 NOTE — Progress Notes (Signed)
I discussed with Dr. Lacinda Axon and reviewed vitals, labs and CT images. Left proximal stone stable since 7/18. Dr. Jeffie Pollock will see in AM.

## 2015-09-14 NOTE — ED Notes (Signed)
Patient transported to CT 

## 2015-09-14 NOTE — H&P (Signed)
History and Physical    Rachel Morgan QIO:[id number] DOB: [birth year] DOA: 09/14/2015  PCP: Marval Regal, MD   Patient coming from: [facility]  Chief Complaint: Abdominal pain, loss of appetite   HPI: Rachel Morgan is a 65 y.o. female with medical history significant for developmental delay, hypothyroidism, hypertension, depression, and agoraphobia who presents to the emergency department for evaluation of right upper quadrant abdominal pain and loss of appetite. Patient  is unable to provide much historical information due to her underlying developmental delay, and history is therefore obtained through discussion with the ED personnel, review of the EMR, and discussion with the patient's caregiver at the bedside. She has reportedly been in her usual state of health until she began to complain of right upper quadrant abdominal pain approximately one month ago. She occasionally complained of this pain but otherwise remained in her usual state until approximately one week ago, when the pain complaints became more frequent. Over the past several days, her appetite has been decreased and she complains of pain multiple times daily. She was seen in the emergency department under very similar circumstances on 07/25/2015, at which time CT revealed an obstructing left UPJ stone with resulting hydronephrosis and cholelithiasis with dilation of the common bile duct. She was nontoxic at that time and lab work was stable and so she was discharged back to her facility with recommendation for outpatient urology and surgical consultations. The outpatient consultations never took place and she returns to the emergency department this evening for evaluation of the aforementioned complaints.  ED Course: Upon arrival to the ED, patient is found to be afebrile, saturating adequately on room air, and with vital signs stable. Chemistry panels notable for serum creatinine of 1.37, up from 1.2 to in July of  this year. CBC is unremarkable and urinalysis is notable for an elevated specific gravity. CT of the abdomen and pelvis features mild common bile duct dilatation that is stable from the CT in July, but increased from 2013, and without definite choledocholithiasis seen. Also noted on the CT is bilateral renal calculi and a large stone causing left UPJ obstruction and hydronephrosis with chronic-appearing cortical thinning of the left kidney which is likely due to chronic UPJ obstruction. General surgery was consulted by the ED physician in light of the right upper quadrant pain and cholelithiasis; Dr. Arnoldo Morale agrees to see the patient in the morning. Urology was consulted by the ED physician for the chronic left UPJ obstruction and have also agreed to evaluate the patient in the morning. Rachel Morgan was given a 500 cc normal saline bolus, remained hemodynamically stable, and will be observed on the medical-surgical unit for ongoing evaluation and management of abdominal pain and loss of appetite in the setting of cholelithiasis and left UPJ obstruction.   Review of Systems:  Unable to obtain ROS secondary to the patient's clinical condition with mental retardation.  Past Medical History:  Diagnosis Date  . Anxiety   . Diabetes mellitus   . Gall stones   . High cholesterol   . Hypertension   . Kidney stones   . Kidney stones   . MR (mental retardation)   . Thyroid disease     History reviewed. No pertinent surgical history.   reports that she has never smoked. She has never used smokeless tobacco. She reports that she does not drink alcohol or use drugs.  Allergies  Allergen Reactions  . Penicillins     History reviewed. No pertinent family  history.   Prior to Admission medications   Medication Sig Start Date End Date Taking? Authorizing Provider  ALPRAZolam Duanne Moron) 1 MG tablet Take 1 mg by mouth 2 (two) times daily.    Yes Historical Provider, MD  aspirin EC 81 MG tablet Take 81 mg by  mouth every morning.    Yes Historical Provider, MD  busPIRone (BUSPAR) 10 MG tablet Take 10 mg by mouth every morning.    Yes Historical Provider, MD  chlorproMAZINE (THORAZINE) 25 MG tablet Take 25 mg by mouth 2 (two) times daily.    Yes Historical Provider, MD  escitalopram (LEXAPRO) 20 MG tablet Take 20 mg by mouth at bedtime.   Yes Historical Provider, MD  levothyroxine (SYNTHROID, LEVOTHROID) 50 MCG tablet Take 50 mcg by mouth daily before breakfast.   Yes Historical Provider, MD  OLANZapine (ZYPREXA) 20 MG tablet Take 20 mg by mouth daily.  07/24/15  Yes Historical Provider, MD  omeprazole (PRILOSEC) 20 MG capsule Take 20 mg by mouth every morning.    Yes Historical Provider, MD  oxybutynin (DITROPAN-XL) 10 MG 24 hr tablet Take 10 mg by mouth every morning.    Yes Historical Provider, MD  spironolactone (ALDACTONE) 50 MG tablet Take 50 mg by mouth 2 (two) times daily.   Yes Historical Provider, MD  traMADol (ULTRAM) 50 MG tablet Take 50 mg by mouth every 6 (six) hours as needed for severe pain.  09/09/15  Yes Historical Provider, MD  zolpidem (AMBIEN) 10 MG tablet Take 10 mg by mouth at bedtime.   Yes Historical Provider, MD    Physical Exam: Vitals:   09/14/15 1640 09/14/15 2128 09/14/15 2129  BP: 151/65 130/60 130/60  Pulse: 73 75 76  Resp:   18  Temp: 97.7 F (36.5 C)    TempSrc: Oral    SpO2: 100% 100% 98%  Weight: 122.5 kg (270 lb)    Height: 5' (1.524 m)        Constitutional: NAD, calm, comfortable, obese Eyes: PERTLA, lids and conjunctivae normal ENMT: Mucous membranes are moist. Posterior pharynx clear of any exudate or lesions.   Neck: normal, supple, no masses, no thyromegaly Respiratory: clear to auscultation bilaterally, no wheezing, no crackles. Normal respiratory effort.   Cardiovascular: S1 & S2 heard, regular rate and rhythm. No extremity edema. 2+ pedal pulses. No significant JVD. Abdomen: No distension, mild tenderness in RUQ without rebound pain or guarding,  no masses palpated. Bowel sounds normal.  Musculoskeletal: no clubbing / cyanosis. No joint deformity upper and lower extremities. Normal muscle tone.  Skin: no significant rashes, lesions, ulcers. Warm, dry, well-perfused. Neurologic: CN 2-12 grossly intact. Dysconjugate gaze. Sensation intact, DTR normal. Strength 5/5 in all 4 limbs.  Psychiatric: Calm, cooperative.     Labs on Admission: I have personally reviewed following labs and imaging studies  CBC:  Recent Labs Lab 09/14/15 1708  WBC 7.3  HGB 13.3  HCT 39.9  MCV 90.9  PLT 031   Basic Metabolic Panel:  Recent Labs Lab 09/14/15 1708  NA 139  K 4.0  CL 107  CO2 21*  GLUCOSE 123*  BUN 26*  CREATININE 1.37*  CALCIUM 9.5   GFR: Estimated Creatinine Clearance: 50.6 mL/min (by C-G formula based on SCr of 1.37 mg/dL). Liver Function Tests:  Recent Labs Lab 09/14/15 1708  AST 22  ALT 20  ALKPHOS 82  BILITOT 0.4  PROT 8.0  ALBUMIN 4.3    Recent Labs Lab 09/14/15 1708  LIPASE 12  No results for input(s): AMMONIA in the last 168 hours. Coagulation Profile: No results for input(s): INR, PROTIME in the last 168 hours. Cardiac Enzymes: No results for input(s): CKTOTAL, CKMB, CKMBINDEX, TROPONINI in the last 168 hours. BNP (last 3 results) No results for input(s): PROBNP in the last 8760 hours. HbA1C: No results for input(s): HGBA1C in the last 72 hours. CBG: No results for input(s): GLUCAP in the last 168 hours. Lipid Profile: No results for input(s): CHOL, HDL, LDLCALC, TRIG, CHOLHDL, LDLDIRECT in the last 72 hours. Thyroid Function Tests: No results for input(s): TSH, T4TOTAL, FREET4, T3FREE, THYROIDAB in the last 72 hours. Anemia Panel: No results for input(s): VITAMINB12, FOLATE, FERRITIN, TIBC, IRON, RETICCTPCT in the last 72 hours. Urine analysis:    Component Value Date/Time   COLORURINE YELLOW 09/14/2015 [place] 09/14/2015 1652   LABSPEC >1.030 (H) 09/14/2015 1652    PHURINE 5.5 09/14/2015 1652   GLUCOSEU NEGATIVE 09/14/2015 1652   HGBUR NEGATIVE 09/14/2015 1652   BILIRUBINUR NEGATIVE 09/14/2015 1652   KETONESUR NEGATIVE 09/14/2015 1652   PROTEINUR NEGATIVE 09/14/2015 1652   UROBILINOGEN 1.0 02/11/2011 2035   NITRITE NEGATIVE 09/14/2015 1652   LEUKOCYTESUR NEGATIVE 09/14/2015 1652   Sepsis Labs: @LABRCNTIP (procalcitonin:4,lacticidven:4) )No results found for this or any previous visit (from the past 240 hour(s)).   Radiological Exams on Admission: Ct Abdomen Pelvis Wo Contrast  Result Date: 09/14/2015 CLINICAL DATA:  Pt sent from home referred by [facility] due to pain in RUQ for one week. Family reports that she has loss of appetite and constipation. EXAM: CT ABDOMEN AND PELVIS WITHOUT CONTRAST TECHNIQUE: Multidetector CT imaging of the abdomen and pelvis was performed following the standard protocol without IV contrast. COMPARISON:  07/25/2015 FINDINGS: Despite efforts by the technologist and patient, motion artifact is present on today's exam and could not be eliminated. This reduces exam sensitivity and specificity. Lower chest: Coronary artery atherosclerosis. Mild enlargement of the cardiopericardial silhouette gas-filled distal esophagus, significance uncertain. Hepatobiliary: Numerous gallstones in the gallbladder measuring up to approximately 0.8 cm. Punctate calcification in segment 5 of the liver, image 26/2, unchanged, likely due to remote inflammation. Mild chronic prominence of the extrahepatic biliary tree at 10 mm diameter. Pancreas: Diffuse fatty atrophy of the pancreas. Spleen: Unremarkable Adrenals/Urinary Tract: Adrenal glands normal. Bilateral nonobstructive nephrolithiasis. On the left side there is prominent cortical thinning, hydronephrosis, and a 1.7 cm in long axis UPJ stone which is likely obstructive. The thinning of the cortex suggests chronic obstruction with resulting renal atrophy. The previous left renal pelvis  calculus appears to of migrated back into the lower pole of the left kidney along with several other calculi in this vicinity. No ureteral or bladder calculus is seen. Stomach/Bowel: There is prominence in stool in the proximal 2/3 of the colon. Appendix normal. Vascular/Lymphatic: Mild aortoiliac atherosclerotic vascular disease. Small periaortic lymph nodes observed. No overtly pathologic adenopathy observed. Reproductive: Unremarkable Other: No supplemental non-categorized findings. Musculoskeletal: Abdominal wall laxity. Generalized muscular atrophy for age. Chronic calcifications in both distal iliopsoas tendons. Exaggerated lumbar lordosis with grade 1 degenerative anterolisthesis at L4-5, and lumbar spondylosis and degenerative disc disease causing impingement at L3-4, L4-5, and L5-S1. IMPRESSION: 1. Mild dilatation of the CBD, at about 10 mm. This is stable from 07/25/2015 but increased from 02/12/2011. I do not directly see choledocholithiasis although CT is relatively insensitive for such, especially with the degree of motion artifact shown on today's exam. 2. Bilateral renal calculi. On the left there is a  1.7 cm in long axis UPJ stone with associated left hydronephrosis, as well as chronic appearing considerable cortical thinning in the left kidney, atrophy probably related to chronic UPJ obstruction. 3. Mild constipation with prominence of stool in the proximal 2/3 of the colon. 4. Coronary and aortoiliac atherosclerosis.  Mild cardiomegaly. 5. Cholelithiasis. 6. Generalized muscular atrophy. 7. Lumbar spondylosis and degenerative disc disease causing impingement at L3-4, L4-5, and L5-S1. Electronically Signed   By: Van Clines M.D.   On: 09/14/2015 18:49    EKG: Not performed, will obtain as appropriate.   Assessment/Plan  1. RUQ abdominal pain  - Possibly biliary colic; imaging reveals cholelithiasis with CBD dilatation to 10 mm which is stable from July 2017 - No suggestion of  infectious process at time of admission  - Pain-control with prn's - Gen surgery is consulting and much appreciated, will follow-up on recommendations   2. Left UPJ obstruction  - Imaging reveals bilateral renal calculi and a left UPJ stone measuring 1.7 cm with associated hydronephrosis, also noted on 07/25/15  - No suggestion of associated UTI at time of admission  - Urology is consulting and much appreciated, will follow-up on recommendations    3. CKD stage III  - SCr 1.37 on admission, up from 1.22 in July 2017  - She appears dehydrated on arrival in the setting of poor appetite  - Given a 500 cc NS bolus in ED and will be continued on a gentle IVF hydration overnight  - Repeat chem panel in am   4. Depression, agoraphobia  - Stable  - Continue current management with Zyprexa, Lexapro, Buspar, trazodone, Xanax    5. GERD - Stable, no EGD report in EMR  - Continue PPI therapy with Protonix     DVT prophylaxis: sq heparin  Code Status: Full  Family Communication: Caretaker updated at bedside Disposition Plan: Observe on med-surg Consults called: Gen surgery, urology Admission status: Observation    Vianne Bulls, MD [facility] Pager [phone]  If 7PM-7AM, please contact night-coverage [email or web address] Password [email or web address]  09/14/2015, 9:59 PM

## 2015-09-14 NOTE — ED Triage Notes (Signed)
Pt sent from home referred by [facility] due to pain in RUQ for one week. Family reports that she has loss of appetite and constipation. Medicated prior to transport due to fear of coming out of house

## 2015-09-15 ENCOUNTER — Observation Stay (HOSPITAL_COMMUNITY): Payer: Medicare Other

## 2015-09-15 DIAGNOSIS — K8020 Calculus of gallbladder without cholecystitis without obstruction: Secondary | ICD-10-CM | POA: Diagnosis not present

## 2015-09-15 DIAGNOSIS — F79 Unspecified intellectual disabilities: Secondary | ICD-10-CM

## 2015-09-15 DIAGNOSIS — N135 Crossing vessel and stricture of ureter without hydronephrosis: Secondary | ICD-10-CM | POA: Diagnosis not present

## 2015-09-15 DIAGNOSIS — N183 Chronic kidney disease, stage 3 (moderate): Secondary | ICD-10-CM | POA: Diagnosis not present

## 2015-09-15 DIAGNOSIS — R1011 Right upper quadrant pain: Secondary | ICD-10-CM | POA: Diagnosis not present

## 2015-09-15 DIAGNOSIS — E039 Hypothyroidism, unspecified: Secondary | ICD-10-CM

## 2015-09-15 LAB — COMPREHENSIVE METABOLIC PANEL
ALT: 20 U/L (ref 14–54)
ANION GAP: 7 (ref 5–15)
AST: 20 U/L (ref 15–41)
Albumin: 3.9 g/dL (ref 3.5–5.0)
Alkaline Phosphatase: 75 U/L (ref 38–126)
BUN: 21 mg/dL — ABNORMAL HIGH (ref 6–20)
CHLORIDE: 110 mmol/L (ref 101–111)
CO2: 23 mmol/L (ref 22–32)
Calcium: 9 mg/dL (ref 8.9–10.3)
Creatinine, Ser: 1.25 mg/dL — ABNORMAL HIGH (ref 0.44–1.00)
GFR, EST AFRICAN AMERICAN: 52 mL/min — AB (ref >60)
GFR, EST NON AFRICAN AMERICAN: 45 mL/min — AB (ref >60)
Glucose, Bld: 102 mg/dL — ABNORMAL HIGH (ref 65–99)
POTASSIUM: 3.8 mmol/L (ref 3.5–5.1)
SODIUM: 140 mmol/L (ref 135–145)
Total Bilirubin: 0.4 mg/dL (ref 0.3–1.2)
Total Protein: 7.3 g/dL (ref 6.5–8.1)

## 2015-09-15 LAB — GLUCOSE, CAPILLARY: GLUCOSE-CAPILLARY: 89 mg/dL (ref 65–99)

## 2015-09-15 LAB — PROTIME-INR
INR: 1.03
PROTHROMBIN TIME: 13.5 s (ref 11.4–15.2)

## 2015-09-15 MED ORDER — LORAZEPAM 2 MG/ML IJ SOLN
1.0000 mg | Freq: Once | INTRAMUSCULAR | Status: AC
  Administered 2015-09-15: 1 mg via INTRAVENOUS
  Filled 2015-09-15: qty 1

## 2015-09-15 NOTE — Progress Notes (Signed)
PROGRESS NOTE    Rachel Morgan  KDX:[id number] DOB: 26-Aug-1950 DOA: 09/14/2015 PCP: Marval Regal, MD   Brief Narrative:  68 yof with a hx of anxiety, DM, HLD, HTN, hypothyroidism, GERD, and CKD stage III presented with complaints of RUQ abdominal pain and loss of appetite.  While in the ED, CT of the abdomen/pelvis revealed bilateral renal calculi. On the left there is a 1.7cm long UPJ stone with hydronephrosis, as well as cortical thinning of the left kidneys possibly related to UPJ obstruction. She was also noted to have cholelithiasis and a dilated CBD. She was seen by urology who felt that she would likely need a ureteroscopy and recommended that she be transferred to [facility] for further management. General surgery have also been consulted at [facility] to see if cholecystectomy is needed. .   Assessment & Plan:   Principal Problem:   UPJ obstruction, acquired Active Problems:   Abdominal pain, right upper quadrant   Mental retardation   Agoraphobia   Dilated cbd, acquired   Hypothyroidism   Depression with anxiety   CKD (chronic kidney disease), stage III   GERD (gastroesophageal reflux disease)   Cholelithiasis   Calculus of gallbladder without cholecystitis without obstruction   Kidney stone on left side  1. RUQ abdominal pain. CT of the abdomen and pelvis revealed cholelithiasis with CBD dilatation to 10 mm.  LFTs are normal, but patient's family report that she has been complaining of intermittent pain in the RUQ for the past month. Discussed with General surgery at [facility] who will see the patient in transfer.  2. Left UPJ obstruction. CT of abdomen/pelvis revealed bilateral renal calculi and a left UPJ stone measuring 1.7 cm with associated hydronephrosis. Urology has evaluated the patient and feels that she will need ureteroscopy. Discussed with Dr. Jeffie Pollock who has recommended transfer to [facility].  3. CKD stage III. Creatinine appears to be at baseline. Continue to follow.   4. GERD.  Continue PPI  5. Hypothyroidism. Continue synthroid.  6. Anxiety with depression. Continue psychotropics. 7. Mental retardation. Patient is unable to verbalize complaints.    DVT prophylaxis: Heparin  Code Status: Full  Family Communication: Discussed with family bedside Disposition Plan: Transfer to [facility] for further management   Consultants:   Urology   General surgery   Procedures:   None   Antimicrobials:   None    Subjective: Patient cannot provide history. Family reports she has been complaining of right sided abdominal pain  Objective: Vitals:   09/14/15 2129 09/14/15 2204 09/15/15 0639 09/15/15 0805  BP: 130/60 130/63 (!) 121/58   Pulse: 76 75 68   Resp: 18 20 17    Temp:  98.2 F (36.8 C) 98 F (36.7 C)   TempSrc:  Oral Oral   SpO2: 98% 99% 96% 95%  Weight:  122 kg (269 lb)    Height:  5' (1.524 m)      Intake/Output Summary (Last 24 hours) at 09/15/15 1442 Last data filed at 09/15/15 0500  Gross per 24 hour  Intake              525 ml  Output                0 ml  Net              525 ml   Filed Weights   09/14/15 1640 09/14/15 2204  Weight: 122.5 kg (270 lb) 122 kg (269 lb)    Examination:  General  exam: Appears calm and comfortable  Respiratory system: Clear to auscultation. Respiratory effort normal. Cardiovascular system: S1 & S2 heard, RRR. No JVD, murmurs, rubs, gallops or clicks. No pedal edema. Gastrointestinal system: Abdomen is obese, soft and nontender. No organomegaly or masses felt. Normal bowel sounds heard. Central nervous system: No focal neurological deficits. Extremities: Symmetric 5 x 5 power. Skin: No rashes, lesions or ulcers Psychiatry: nonverbal     Data Reviewed: I have personally reviewed following labs and imaging studies  CBC:  Recent Labs Lab 09/14/15 1708  WBC 7.3  HGB 13.3  HCT 39.9  MCV 90.9  PLT 852   Basic Metabolic Panel:  Recent Labs Lab 09/14/15 1708 09/15/15 0615  NA  139 140  K 4.0 3.8  CL 107 110  CO2 21* 23  GLUCOSE 123* 102*  BUN 26* 21*  CREATININE 1.37* 1.25*  CALCIUM 9.5 9.0   GFR: Estimated Creatinine Clearance: 55.3 mL/min (by C-G formula based on SCr of 1.25 mg/dL). Liver Function Tests:  Recent Labs Lab 09/14/15 1708 09/15/15 0615  AST 22 20  ALT 20 20  ALKPHOS 82 75  BILITOT 0.4 0.4  PROT 8.0 7.3  ALBUMIN 4.3 3.9    Recent Labs Lab 09/14/15 1708  LIPASE 12   No results for input(s): AMMONIA in the last 168 hours. Coagulation Profile:  Recent Labs Lab 09/15/15 0615  INR 1.03   Cardiac Enzymes: No results for input(s): CKTOTAL, CKMB, CKMBINDEX, TROPONINI in the last 168 hours. BNP (last 3 results) No results for input(s): PROBNP in the last 8760 hours. HbA1C: No results for input(s): HGBA1C in the last 72 hours. CBG:  Recent Labs Lab 09/15/15 0821  GLUCAP 89   Lipid Profile: No results for input(s): CHOL, HDL, LDLCALC, TRIG, CHOLHDL, LDLDIRECT in the last 72 hours. Thyroid Function Tests: No results for input(s): TSH, T4TOTAL, FREET4, T3FREE, THYROIDAB in the last 72 hours. Anemia Panel: No results for input(s): VITAMINB12, FOLATE, FERRITIN, TIBC, IRON, RETICCTPCT in the last 72 hours. Sepsis Labs: No results for input(s): PROCALCITON, LATICACIDVEN in the last 168 hours.  No results found for this or any previous visit (from the past 240 hour(s)).       Radiology Studies: Ct Abdomen Pelvis Wo Contrast  Result Date: 09/14/2015 CLINICAL DATA:  Pt sent from home referred by [facility] due to pain in RUQ for one week. Family reports that she has loss of appetite and constipation. EXAM: CT ABDOMEN AND PELVIS WITHOUT CONTRAST TECHNIQUE: Multidetector CT imaging of the abdomen and pelvis was performed following the standard protocol without IV contrast. COMPARISON:  07/25/2015 FINDINGS: Despite efforts by the technologist and patient, motion artifact is present on today's exam and could not be  eliminated. This reduces exam sensitivity and specificity. Lower chest: Coronary artery atherosclerosis. Mild enlargement of the cardiopericardial silhouette gas-filled distal esophagus, significance uncertain. Hepatobiliary: Numerous gallstones in the gallbladder measuring up to approximately 0.8 cm. Punctate calcification in segment 5 of the liver, image 26/2, unchanged, likely due to remote inflammation. Mild chronic prominence of the extrahepatic biliary tree at 10 mm diameter. Pancreas: Diffuse fatty atrophy of the pancreas. Spleen: Unremarkable Adrenals/Urinary Tract: Adrenal glands normal. Bilateral nonobstructive nephrolithiasis. On the left side there is prominent cortical thinning, hydronephrosis, and a 1.7 cm in long axis UPJ stone which is likely obstructive. The thinning of the cortex suggests chronic obstruction with resulting renal atrophy. The previous left renal pelvis calculus appears to of migrated back into the lower pole of the left  kidney along with several other calculi in this vicinity. No ureteral or bladder calculus is seen. Stomach/Bowel: There is prominence in stool in the proximal 2/3 of the colon. Appendix normal. Vascular/Lymphatic: Mild aortoiliac atherosclerotic vascular disease. Small periaortic lymph nodes observed. No overtly pathologic adenopathy observed. Reproductive: Unremarkable Other: No supplemental non-categorized findings. Musculoskeletal: Abdominal wall laxity. Generalized muscular atrophy for age. Chronic calcifications in both distal iliopsoas tendons. Exaggerated lumbar lordosis with grade 1 degenerative anterolisthesis at L4-5, and lumbar spondylosis and degenerative disc disease causing impingement at L3-4, L4-5, and L5-S1. IMPRESSION: 1. Mild dilatation of the CBD, at about 10 mm. This is stable from 07/25/2015 but increased from 02/12/2011. I do not directly see choledocholithiasis although CT is relatively insensitive for such, especially with the degree of  motion artifact shown on today's exam. 2. Bilateral renal calculi. 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 3. Mild constipation with prominence of stool in the proximal 2/3 of the colon. 4. Coronary and aortoiliac atherosclerosis.  Mild cardiomegaly. 5. Cholelithiasis. 6. Generalized muscular atrophy. 7. Lumbar spondylosis and degenerative disc disease causing impingement at L3-4, L4-5, and L5-S1. Electronically Signed   By: Van Clines M.D.   On: 09/14/2015 18:49        Scheduled Meds: . ALPRAZolam  1 mg Oral BID  . busPIRone  10 mg Oral q morning - 10a  . chlorproMAZINE  25 mg Oral BID  . escitalopram  20 mg Oral QHS  . heparin  5,000 Units Subcutaneous Q8H  . levothyroxine  50 mcg Oral QAC breakfast  . OLANZapine  20 mg Oral Daily  . oxybutynin  10 mg Oral q morning - 10a  . pantoprazole  40 mg Oral Daily  . zolpidem  5 mg Oral QHS   Continuous Infusions:     LOS: 0 days    Time spent: 25 minutes    Kathie Dike, MD [facility] If 7PM-7AM, please contact night-coverage [email or web address] Password [email or web address] 09/15/2015, 2:42 PM

## 2015-09-15 NOTE — Care Management Obs Status (Signed)
[place] NOTIFICATION   Patient Details  Name: Rachel Morgan MRN: [id number] Date of Birth: 1950/03/31   Medicare Observation Status Notification Given:  Yes    Loron Weimer, Chauncey [place], RN 09/15/2015, 12:35 PM

## 2015-09-15 NOTE — Progress Notes (Signed)
Received pt from [facility], handoff report from [place], pt stable, family at bedside. Pt calm at the present time. Denies paiin. Will cont plan of care. Srp, RN

## 2015-09-15 NOTE — Consult Note (Signed)
Subjective: Left UPJ stone.  Hx:  Rachel Morgan is a 65 yo MR WF who we were asked to see in consultation by Dr. Roderic Palau for a left UPJ stone with obstruction.   She had pyelonephritis in 2013 and had non-obstructing renal stones at that time.   She was seen in the ER in July with RUQ pain and was found to have the 1.7cm left UPJ stone with obstruction and mild renal insufficiency and was recommended to see urology.   She was readmitted last night with RUQ pain and constipation and a repeat CT showed no change in the stone or obstruction.   She has no left flank pain or nausea and her UA is clear.   She is to be seen by Dr. Arnoldo Morale regarding her marked cholelithiasis.   ROS:  Review of Systems  Unable to perform ROS: Mental acuity  Constitutional: Negative for chills and fever.  Respiratory: Positive for shortness of breath.   Cardiovascular: Negative for chest pain.  Gastrointestinal: Positive for abdominal pain and constipation. Negative for nausea and vomiting.  Genitourinary: Negative.   All other systems reviewed and are negative. History obtained from sister.   Allergies  Allergen Reactions  . Penicillins     Past Medical History:  Diagnosis Date  . Anxiety   . Diabetes mellitus   . Gall stones   . High cholesterol   . Hypertension   . Kidney stones   . Kidney stones   . MR (mental retardation)   . Thyroid disease     History reviewed. No pertinent surgical history.  Social History   Social History  . Marital status: Single    Spouse name: N/A  . Number of children: N/A  . Years of education: N/A   Occupational History  . Not on file.   Social History Main Topics  . Smoking status: Never Smoker  . Smokeless tobacco: Never Used  . Alcohol use No  . Drug use: No  . Sexual activity: No   Other Topics Concern  . Not on file   Social History Narrative  . No narrative on file    History reviewed. No pertinent family  history.  Anti-infectives: Anti-infectives    None      Current Facility-Administered Medications  Medication Dose Route Frequency Provider Last Rate Last Dose  . acetaminophen (TYLENOL) tablet 650 mg  650 mg Oral Q6H PRN Vianne Bulls, MD       Or  . acetaminophen (TYLENOL) suppository 650 mg  650 mg Rectal Q6H PRN Vianne Bulls, MD      . ALPRAZolam Duanne Moron) tablet 1 mg  1 mg Oral BID Vianne Bulls, MD   1 mg at 09/14/15 2247  . bisacodyl (DULCOLAX) EC tablet 5 mg  5 mg Oral Daily PRN Vianne Bulls, MD      . busPIRone (BUSPAR) tablet 10 mg  10 mg Oral q morning - 10a Ilene Qua Opyd, MD      . chlorproMAZINE (THORAZINE) tablet 25 mg  25 mg Oral BID Ilene Qua Opyd, MD      . escitalopram (LEXAPRO) tablet 20 mg  20 mg Oral QHS Vianne Bulls, MD   20 mg at 09/14/15 2247  . fentaNYL (SUBLIMAZE) injection 12.5 mcg  12.5 mcg Intravenous Q2H PRN Vianne Bulls, MD      . heparin injection 5,000 Units  5,000 Units Subcutaneous Q8H Vianne Bulls, MD   5,000 Units at 09/14/15 2247  .  levothyroxine (SYNTHROID, LEVOTHROID) tablet 50 mcg  50 mcg Oral QAC breakfast Ilene Qua Opyd, MD      . OLANZapine (ZYPREXA) tablet 20 mg  20 mg Oral Daily Ilene Qua Opyd, MD      . ondansetron (ZOFRAN) tablet 4 mg  4 mg Oral Q6H PRN Vianne Bulls, MD       Or  . ondansetron (ZOFRAN) injection 4 mg  4 mg Intravenous Q6H PRN Vianne Bulls, MD      . oxybutynin (DITROPAN-XL) 24 hr tablet 10 mg  10 mg Oral q morning - 10a Timothy S Opyd, MD      . pantoprazole (PROTONIX) EC tablet 40 mg  40 mg Oral Daily Timothy S Opyd, MD      . polyethylene glycol (MIRALAX / GLYCOLAX) packet 17 g  17 g Oral Daily PRN Vianne Bulls, MD      . traMADol (ULTRAM) tablet 50 mg  50 mg Oral Q6H PRN Vianne Bulls, MD      . zolpidem (AMBIEN) tablet 5 mg  5 mg Oral QHS Vianne Bulls, MD   5 mg at 09/14/15 2247   Past medical, surgical, social and family history reviewed.   Objective: Vital signs in last 24 hours: Temp:  [97.7  F (36.5 C)-98.2 F (36.8 C)] 98 F (36.7 C) (09/08 0639) Pulse Rate:  [68-76] 68 (09/08 0639) Resp:  [17-20] 17 (09/08 0639) BP: (121-151)/(58-65) 121/58 (09/08 0639) SpO2:  [96 %-100 %] 96 % (09/08 0639) Weight:  [122 kg (269 lb)-122.5 kg (270 lb)] 122 kg (269 lb) (09/07 2204)  Intake/Output from previous day: 09/07 0701 - 09/08 0700 In: 525 [I.V.:525] Out: -  Intake/Output this shift: No intake/output data recorded.   Physical Exam  Constitutional:  WD, Obese WF who is minimally communicative secondary to mental retardation.   She is NAD  HENT:  Head: Normocephalic and atraumatic.  Neck: Normal range of motion. Neck supple. No thyromegaly present.  Cardiovascular: Normal rate and regular rhythm.   Pulmonary/Chest: Effort normal. No respiratory distress.  Distant BS  Abdominal:  Soft, obese with mild RUQ tenderness without guarding.  No mass, HSM or hernia noted.   Musculoskeletal: Normal range of motion. She exhibits edema (in the lower legs). She exhibits no tenderness.  Lymphadenopathy:    She has no cervical adenopathy.  No cervical, supraclavicular or axillary adenopathy.   Neurological:  A/O x 0.  No focal deficits.  Skin: Skin is warm and dry.  Psychiatric:  No agitation but otherwise unable to assess.     Lab Results:   Recent Labs  09/14/15 1708  WBC 7.3  HGB 13.3  HCT 39.9  PLT 214   BMET  Recent Labs  09/14/15 1708 09/15/15 0615  NA 139 140  K 4.0 3.8  CL 107 110  CO2 21* 23  GLUCOSE 123* 102*  BUN 26* 21*  CREATININE 1.37* 1.25*  CALCIUM 9.5 9.0   PT/INR  Recent Labs  09/15/15 0615  LABPROT 13.5  INR 1.03   ABG No results for input(s): PHART, HCO3 in the last 72 hours.  Invalid input(s): PCO2, PO2  Studies/Results: Ct Abdomen Pelvis Wo Contrast  Result Date: 09/14/2015 CLINICAL DATA:  Pt sent from home referred by [facility] due to pain in RUQ for one week. Family reports that she has loss of appetite and  constipation. EXAM: CT ABDOMEN AND PELVIS WITHOUT CONTRAST TECHNIQUE: Multidetector CT imaging of the abdomen and pelvis was  performed following the standard protocol without IV contrast. COMPARISON:  07/25/2015 FINDINGS: Despite efforts by the technologist and patient, motion artifact is present on today's exam and could not be eliminated. This reduces exam sensitivity and specificity. Lower chest: Coronary artery atherosclerosis. Mild enlargement of the cardiopericardial silhouette gas-filled distal esophagus, significance uncertain. Hepatobiliary: Numerous gallstones in the gallbladder measuring up to approximately 0.8 cm. Punctate calcification in segment 5 of the liver, image 26/2, unchanged, likely due to remote inflammation. Mild chronic prominence of the extrahepatic biliary tree at 10 mm diameter. Pancreas: Diffuse fatty atrophy of the pancreas. Spleen: Unremarkable Adrenals/Urinary Tract: Adrenal glands normal. Bilateral nonobstructive nephrolithiasis. On the left side there is prominent cortical thinning, hydronephrosis, and a 1.7 cm in long axis UPJ stone which is likely obstructive. The thinning of the cortex suggests chronic obstruction with resulting renal atrophy. The previous left renal pelvis calculus appears to of migrated back into the lower pole of the left kidney along with several other calculi in this vicinity. No ureteral or bladder calculus is seen. Stomach/Bowel: There is prominence in stool in the proximal 2/3 of the colon. Appendix normal. Vascular/Lymphatic: Mild aortoiliac atherosclerotic vascular disease. Small periaortic lymph nodes observed. No overtly pathologic adenopathy observed. Reproductive: Unremarkable Other: No supplemental non-categorized findings. Musculoskeletal: Abdominal wall laxity. Generalized muscular atrophy for age. Chronic calcifications in both distal iliopsoas tendons. Exaggerated lumbar lordosis with grade 1 degenerative anterolisthesis at L4-5, and lumbar  spondylosis and degenerative disc disease causing impingement at L3-4, L4-5, and L5-S1. IMPRESSION: 1. Mild dilatation of the CBD, at about 10 mm. This is stable from 07/25/2015 but increased from 02/12/2011. I do not directly see choledocholithiasis although CT is relatively insensitive for such, especially with the degree of motion artifact shown on today's exam. 2. Bilateral renal calculi. 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 3. Mild constipation with prominence of stool in the proximal 2/3 of the colon. 4. Coronary and aortoiliac atherosclerosis.  Mild cardiomegaly. 5. Cholelithiasis. 6. Generalized muscular atrophy. 7. Lumbar spondylosis and degenerative disc disease causing impingement at L3-4, L4-5, and L5-S1. Electronically Signed   By: Van Clines M.D.   On: 09/14/2015 18:49   I have reviewed the CT films and reports from this admission and July.  I have reviewed her UA and labs.  I have reviewed her hospital notes.   Assessment: 1. Left 1.7cm UPJ stone with obstruction.    Stone was first noted in July and she has no left flank pain, fever or nausea and the urine is clear.    2. RLP stone without obstruction.  3. RUQ pain possibly from cholelithiasis.  4. Severe MR and anxiety.  5. Mild CRI.   Rec: 1. Her body habitus and mental status would make ESWL difficult so I think she will be best served with ureteroscopic management of the left UPJ stone.   Because of equipment needs and her physical and mental condition, I would prefer to do this at [facility].   I have reviewed the risks of bleeding, infection, ureteral and renal injury, need for a stent and secondary procedures, thrombotic events and anesthetic complications with her sister who is her legal guardian.    If she is to have cholecystectomy here, it  would be worthwhile to consider placement of a left ureteral stent to aid subsequent ureteroscopy.  2.  She doesn't need treatment of the right renal stone at this time.    CC: Dr. Pearletha Forge and Dr. Aviva Signs.      Amritha Yorke J 09/15/2015 [phone]

## 2015-09-15 NOTE — Care Management Note (Addendum)
Case Management Note  Patient Details  Name: Rachel Morgan MRN: [id number] Date of Birth: Aug 10, 1950  Subjective/Objective: Patient is from home with sister, who is at bedside offering information. She states pt. Has HH through [facility] and an aide M-F 9-5 and S-S, 9-4:30.                  Action/Plan: Will follow and fax resumption orders and DC summary to [facility] upon discharge.  [place] : fax number [phone]  Expected Discharge Date:       09/17/2015           Expected Discharge Plan:  [place]  In-House Referral:     Discharge planning Services  CM Consult  Post Acute Care Choice:  NA Choice offered to:  NA  DME Arranged:    DME Agency:     HH Arranged:    [place] Agency:     Status of Service:  Completed, signed off  If discussed at [employer], dates discussed:    Additional Comments:  Rachel Morgan, Rachel Reading, RN 09/15/2015, 12:19 PM

## 2015-09-15 NOTE — Progress Notes (Signed)
Patient received 1 mg of IV Ativan prior to transfer to [facility]. Patient's name is no longer in the [facility] for wasting. The remaining 1 mg of IV Ativan was wasted in the sharps bin  with Sharen Hones, RN.

## 2015-09-16 ENCOUNTER — Observation Stay (HOSPITAL_COMMUNITY): Payer: Medicare Other

## 2015-09-16 DIAGNOSIS — N135 Crossing vessel and stricture of ureter without hydronephrosis: Secondary | ICD-10-CM

## 2015-09-16 DIAGNOSIS — K8020 Calculus of gallbladder without cholecystitis without obstruction: Secondary | ICD-10-CM | POA: Diagnosis not present

## 2015-09-16 LAB — COMPREHENSIVE METABOLIC PANEL
ALK PHOS: 69 U/L (ref 38–126)
ALT: 18 U/L (ref 14–54)
AST: 19 U/L (ref 15–41)
Albumin: 3.5 g/dL (ref 3.5–5.0)
Anion gap: 7 (ref 5–15)
BILIRUBIN TOTAL: 0.4 mg/dL (ref 0.3–1.2)
BUN: 17 mg/dL (ref 6–20)
CALCIUM: 8.9 mg/dL (ref 8.9–10.3)
CO2: 24 mmol/L (ref 22–32)
CREATININE: 1.18 mg/dL — AB (ref 0.44–1.00)
Chloride: 110 mmol/L (ref 101–111)
GFR, EST AFRICAN AMERICAN: 56 mL/min — AB (ref >60)
GFR, EST NON AFRICAN AMERICAN: 48 mL/min — AB (ref >60)
GLUCOSE: 90 mg/dL (ref 65–99)
Potassium: 3.9 mmol/L (ref 3.5–5.1)
Sodium: 141 mmol/L (ref 135–145)
Total Protein: 6.8 g/dL (ref 6.5–8.1)

## 2015-09-16 LAB — CBC
HCT: 37.3 % (ref 36.0–46.0)
HEMOGLOBIN: 12.2 g/dL (ref 12.0–15.0)
MCH: 29.8 pg (ref 26.0–34.0)
MCHC: 32.7 g/dL (ref 30.0–36.0)
MCV: 91 fL (ref 78.0–100.0)
Platelets: 198 10*3/uL (ref 150–400)
RBC: 4.1 MIL/uL (ref 3.87–5.11)
RDW: 13.8 % (ref 11.5–15.5)
WBC: 5.8 10*3/uL (ref 4.0–10.5)

## 2015-09-16 LAB — GLUCOSE, CAPILLARY: Glucose-Capillary: 87 mg/dL (ref 65–99)

## 2015-09-16 MED ORDER — SODIUM CHLORIDE 0.45 % IV SOLN
INTRAVENOUS | Status: DC
  Administered 2015-09-16 (×2): via INTRAVENOUS

## 2015-09-16 NOTE — Progress Notes (Signed)
Subjective: Patient is still experiencing right upper quadrant pain, according to sister, [name].  No nausea.  No vomiting.  Objective: Vital signs in last 24 hours: Temp:  [97.8 F (36.6 C)-98 F (36.7 C)] 97.8 F (36.6 C) (09/09 0620) Pulse Rate:  [64-69] 65 (09/09 0620) Resp:  [18] 18 (09/09 0620) BP: (106-115)/(51-74) 115/74 (09/09 0620) SpO2:  [97 %-100 %] 100 % (09/09 0620) Weight:  [120.9 kg (266 lb 8.6 oz)-122.5 kg (270 lb)] 120.9 kg (266 lb 8.6 oz) (09/09 0620)  Intake/Output from previous day: 09/08 0701 - 09/09 0700 In: 240 [P.O.:240] Out: -  Intake/Output this shift: No intake/output data recorded.  Physical Exam:  Constitutional: Vital signs reviewed. WD WN in NAD   Eyes: PERRL, No scleral icterus.   Pulmonary/Chest: Normal effort a   Lab Results:  Recent Labs  09/14/15 1708 09/16/15 0533  HGB 13.3 12.2  HCT 39.9 37.3   BMET  Recent Labs  09/15/15 0615 09/16/15 0533  NA 140 141  K 3.8 3.9  CL 110 110  CO2 23 24  GLUCOSE 102* 90  BUN 21* 17  CREATININE 1.25* 1.18*  CALCIUM 9.0 8.9    Recent Labs  09/15/15 0615  INR 1.03   No results for input(s): LABURIN in the last 72 hours. Results for orders placed or performed during the hospital encounter of 02/11/11  Culture, blood (routine x 2)     Status: None   Collection Time: 02/11/11  7:57 PM  Result Value Ref Range Status   Specimen Description LEFT ANTECUBITAL  Final   Special Requests BOTTLES DRAWN AEROBIC AND ANAEROBIC 5CC  Final   Culture NO GROWTH 5 DAYS  Final   Report Status 02/16/2011 FINAL  Final  Urine culture     Status: None   Collection Time: 02/11/11  8:35 PM  Result Value Ref Range Status   Specimen Description URINE, CATHETERIZED  Final   Special Requests NONE  Final   Culture  Setup Time 201,302,050,250  Final   Colony Count NO GROWTH  Final   Culture NO GROWTH  Final   Report Status 02/12/2011 FINAL  Final  Culture, blood (routine x 2)     Status: None   Collection Time: 02/11/11  9:17 PM  Result Value Ref Range Status   Specimen Description LEFT ANTECUBITAL  Final   Special Requests BOTTLES DRAWN AEROBIC AND ANAEROBIC 5CC  Final   Culture NO GROWTH 5 DAYS  Final   Report Status 02/16/2011 FINAL  Final  MRSA PCR Screening     Status: None   Collection Time: 02/12/11 12:32 AM  Result Value Ref Range Status   MRSA by PCR NEGATIVE NEGATIVE Final    Comment:        The GeneXpert MRSA Assay (FDA approved for NASAL specimens only), is one component of a comprehensive MRSA colonization surveillance program. It is not intended to diagnose MRSA infection nor to guide or monitor treatment for MRSA infections.    Studies/Results: Ct Abdomen Pelvis Wo Contrast  Result Date: 09/14/2015 CLINICAL DATA:  Pt sent from home referred by [facility] due to pain in RUQ for one week. Family reports that she has loss of appetite and constipation. EXAM: CT ABDOMEN AND PELVIS WITHOUT CONTRAST TECHNIQUE: Multidetector CT imaging of the abdomen and pelvis was performed following the standard protocol without IV contrast. COMPARISON:  07/25/2015 FINDINGS: Despite efforts by the technologist and patient, motion artifact is present on today's exam and could not be eliminated.  This reduces exam sensitivity and specificity. Lower chest: Coronary artery atherosclerosis. Mild enlargement of the cardiopericardial silhouette gas-filled distal esophagus, significance uncertain. Hepatobiliary: Numerous gallstones in the gallbladder measuring up to approximately 0.8 cm. Punctate calcification in segment 5 of the liver, image 26/2, unchanged, likely due to remote inflammation. Mild chronic prominence of the extrahepatic biliary tree at 10 mm diameter. Pancreas: Diffuse fatty atrophy of the pancreas. Spleen: Unremarkable Adrenals/Urinary Tract: Adrenal glands normal. Bilateral nonobstructive nephrolithiasis. On the left side there is prominent cortical thinning,  hydronephrosis, and a 1.7 cm in long axis UPJ stone which is likely obstructive. The thinning of the cortex suggests chronic obstruction with resulting renal atrophy. The previous left renal pelvis calculus appears to of migrated back into the lower pole of the left kidney along with several other calculi in this vicinity. No ureteral or bladder calculus is seen. Stomach/Bowel: There is prominence in stool in the proximal 2/3 of the colon. Appendix normal. Vascular/Lymphatic: Mild aortoiliac atherosclerotic vascular disease. Small periaortic lymph nodes observed. No overtly pathologic adenopathy observed. Reproductive: Unremarkable Other: No supplemental non-categorized findings. Musculoskeletal: Abdominal wall laxity. Generalized muscular atrophy for age. Chronic calcifications in both distal iliopsoas tendons. Exaggerated lumbar lordosis with grade 1 degenerative anterolisthesis at L4-5, and lumbar spondylosis and degenerative disc disease causing impingement at L3-4, L4-5, and L5-S1. IMPRESSION: 1. Mild dilatation of the CBD, at about 10 mm. This is stable from 07/25/2015 but increased from 02/12/2011. I do not directly see choledocholithiasis although CT is relatively insensitive for such, especially with the degree of motion artifact shown on today's exam. 2. Bilateral renal calculi. 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 3. Mild constipation with prominence of stool in the proximal 2/3 of the colon. 4. Coronary and aortoiliac atherosclerosis.  Mild cardiomegaly. 5. Cholelithiasis. 6. Generalized muscular atrophy. 7. Lumbar spondylosis and degenerative disc disease causing impingement at L3-4, L4-5, and L5-S1. Electronically Signed   By: Van Clines M.D.   On: 09/14/2015 18:49    Assessment/Plan:   1.  Left renal  calculus with mild hydronephrosis, chronic in nature.  If she is to have cholecystectomy, we can perform cystoscopy and left double-J stent placement at the same time     2.  Cholelithiasis with possible cholecystitis.  General surgery consult pending.     LOS: 0 days   Franchot Gallo M 09/16/2015, 9:03 AM

## 2015-09-16 NOTE — Progress Notes (Signed)
I discussed case w/ Dr Hassell Done prior to RUQ U/S. If lap choly not needed, we will f/u pt on an outpt basis for future URS and laser litho of stone. OK for d/c at any point from GU perspective.

## 2015-09-16 NOTE — Progress Notes (Signed)
Spoke with Dr. Hassell Done about getting a diet order for pt. Per Dr. Hassell Done pt is ok to have whatever diet she was on at home. Family at bedside and made aware. Order placed. VWilliams,rn.

## 2015-09-16 NOTE — Progress Notes (Signed)
PROGRESS NOTE    Rachel Morgan  GHW:[id number] DOB: [birth year] DOA: 09/14/2015 PCP: Marval Regal, MD   Brief Narrative:  65 yo F with a hx of anxiety, DM, HLD, HTN, hypothyroidism, GERD, and CKD stage III presented with complaints of RUQ abdominal pain and loss of appetite.  While in the ED, CT of the abdomen/pelvis revealed bilateral renal calculi. On the left there is a 1.7cm long UPJ stone with hydronephrosis, as well as cortical thinning of the left kidneys possibly related to UPJ obstruction. She was also noted to have cholelithiasis and a dilated CBD. She was seen today by urology and surgery this AM.  Assessment & Plan:   Principal Problem:   UPJ obstruction, acquired Active Problems:   Abdominal pain, right upper quadrant   Mental retardation   Agoraphobia   Dilated cbd, acquired   Hypothyroidism   Depression with anxiety   CKD (chronic kidney disease), stage III   GERD (gastroesophageal reflux disease)   Cholelithiasis   Calculus of gallbladder without cholecystitis without obstruction   Kidney stone on left side  1. RUQ abdominal pain. CT of the abdomen and pelvis revealed cholelithiasis with CBD dilatation to 10 mm.  LFTs are normal, but patient's family report that she has been complaining of intermittent pain in the RUQ for the past month. General surgery has seen, obtained RUQ [place] to evaluate for cholecystitis. 2. Left UPJ obstruction. CT of abdomen/pelvis revealed bilateral renal calculi and a left UPJ stone measuring 1.7 cm with associated hydronephrosis. Urology has evaluated the patient and feels that she will need ureteroscopy, awaiting final gen surgery recommendations today. 3. CKD stage III. Creatinine appears to be at baseline. Continue to follow.   4. GERD. Continue PPI  5. Hypothyroidism. Continue synthroid.  6. Anxiety with depression. Continue psychotropics. 7. Mental retardation. Patient is unable to verbalize complaints.   DVT prophylaxis: Heparin     Code Status: Full   Family Communication: Discussed with family bedside Disposition Plan: Pending surgery plans.  Consultants:   Urology   General surgery   Procedures:   None   Antimicrobials:   None   Subjective: Patient cannot provide history. Sister at the bedside reports she has been complaining of right sided abdominal pain, is resting now.  Objective: Vitals:   09/15/15 0805 09/15/15 1719 09/15/15 2110 09/16/15 0620  BP:  (!) 113/51 106/65 115/74  Pulse:  69 64 65  Resp:  18 18 18   Temp:  98 F (36.7 C) 98 F (36.7 C) 97.8 F (36.6 C)  TempSrc:  Oral Oral Oral  SpO2: 95% 98% 97% 100%  Weight:  122.5 kg (270 lb)  120.9 kg (266 lb 8.6 oz)  Height:  5' (1.524 m)      Intake/Output Summary (Last 24 hours) at 09/16/15 1412 Last data filed at 09/15/15 2300  Gross per 24 hour  Intake              240 ml  Output                0 ml  Net              240 ml   Filed Weights   09/14/15 2204 09/15/15 1719 09/16/15 0620  Weight: 122 kg (269 lb) 122.5 kg (270 lb) 120.9 kg (266 lb 8.6 oz)   Examination: General exam: Appears calm and comfortable  Respiratory system: Clear to auscultation. Respiratory effort normal. Cardiovascular system: S1 & S2 heard, RRR. No JVD,  murmurs, rubs, gallops or clicks. No pedal edema. Gastrointestinal system: Abdomen is obese, soft and nontender. No organomegaly or masses felt. Normal bowel sounds heard. Central nervous system: No focal neurological deficits. Extremities: Symmetric 5 x 5 power. Skin: No rashes, lesions or ulcers Psychiatry: nonverbal   Data Reviewed: I have personally reviewed following labs and imaging studies  CBC:  Recent Labs Lab 09/14/15 1708 09/16/15 0533  WBC 7.3 5.8  HGB 13.3 12.2  HCT 39.9 37.3  MCV 90.9 91.0  PLT 214 630   Basic Metabolic Panel:  Recent Labs Lab 09/14/15 1708 09/15/15 0615 09/16/15 0533  NA 139 140 141  K 4.0 3.8 3.9  CL 107 110 110  CO2 21* 23 24  GLUCOSE 123*  102* 90  BUN 26* 21* 17  CREATININE 1.37* 1.25* 1.18*  CALCIUM 9.5 9.0 8.9   GFR: Estimated Creatinine Clearance: 58.3 mL/min (by C-G formula based on SCr of 1.18 mg/dL). Liver Function Tests:  Recent Labs Lab 09/14/15 1708 09/15/15 0615 09/16/15 0533  AST 22 20 19   ALT 20 20 18   ALKPHOS 82 75 69  BILITOT 0.4 0.4 0.4  PROT 8.0 7.3 6.8  ALBUMIN 4.3 3.9 3.5    Recent Labs Lab 09/14/15 1708  LIPASE 12   No results for input(s): AMMONIA in the last 168 hours. Coagulation Profile:  Recent Labs Lab 09/15/15 0615  INR 1.03   Cardiac Enzymes: No results for input(s): CKTOTAL, CKMB, CKMBINDEX, TROPONINI in the last 168 hours. BNP (last 3 results) No results for input(s): PROBNP in the last 8760 hours. HbA1C: No results for input(s): HGBA1C in the last 72 hours. CBG:  Recent Labs Lab 09/15/15 0821 09/16/15 0808  GLUCAP 89 87   Lipid Profile: No results for input(s): CHOL, HDL, LDLCALC, TRIG, CHOLHDL, LDLDIRECT in the last 72 hours. Thyroid Function Tests: No results for input(s): TSH, T4TOTAL, FREET4, T3FREE, THYROIDAB in the last 72 hours. Anemia Panel: No results for input(s): VITAMINB12, FOLATE, FERRITIN, TIBC, IRON, RETICCTPCT in the last 72 hours. Sepsis Labs: No results for input(s): PROCALCITON, LATICACIDVEN in the last 168 hours.  No results found for this or any previous visit (from the past 240 hour(s)).       Radiology Studies: Ct Abdomen Pelvis Wo Contrast  Result Date: 09/14/2015 CLINICAL DATA:  Pt sent from home referred by [facility] due to pain in RUQ for one week. Family reports that she has loss of appetite and constipation. EXAM: CT ABDOMEN AND PELVIS WITHOUT CONTRAST TECHNIQUE: Multidetector CT imaging of the abdomen and pelvis was performed following the standard protocol without IV contrast. COMPARISON:  07/25/2015 FINDINGS: Despite efforts by the technologist and patient, motion artifact is present on today's exam and could  not be eliminated. This reduces exam sensitivity and specificity. Lower chest: Coronary artery atherosclerosis. Mild enlargement of the cardiopericardial silhouette gas-filled distal esophagus, significance uncertain. Hepatobiliary: Numerous gallstones in the gallbladder measuring up to approximately 0.8 cm. Punctate calcification in segment 5 of the liver, image 26/2, unchanged, likely due to remote inflammation. Mild chronic prominence of the extrahepatic biliary tree at 10 mm diameter. Pancreas: Diffuse fatty atrophy of the pancreas. Spleen: Unremarkable Adrenals/Urinary Tract: Adrenal glands normal. Bilateral nonobstructive nephrolithiasis. On the left side there is prominent cortical thinning, hydronephrosis, and a 1.7 cm in long axis UPJ stone which is likely obstructive. The thinning of the cortex suggests chronic obstruction with resulting renal atrophy. The previous left renal pelvis calculus appears to of migrated back into the lower pole  of the left kidney along with several other calculi in this vicinity. No ureteral or bladder calculus is seen. Stomach/Bowel: There is prominence in stool in the proximal 2/3 of the colon. Appendix normal. Vascular/Lymphatic: Mild aortoiliac atherosclerotic vascular disease. Small periaortic lymph nodes observed. No overtly pathologic adenopathy observed. Reproductive: Unremarkable Other: No supplemental non-categorized findings. Musculoskeletal: Abdominal wall laxity. Generalized muscular atrophy for age. Chronic calcifications in both distal iliopsoas tendons. Exaggerated lumbar lordosis with grade 1 degenerative anterolisthesis at L4-5, and lumbar spondylosis and degenerative disc disease causing impingement at L3-4, L4-5, and L5-S1. IMPRESSION: 1. Mild dilatation of the CBD, at about 10 mm. This is stable from 07/25/2015 but increased from 02/12/2011. I do not directly see choledocholithiasis although CT is relatively insensitive for such, especially with the degree  of motion artifact shown on today's exam. 2. Bilateral renal calculi. 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 3. Mild constipation with prominence of stool in the proximal 2/3 of the colon. 4. Coronary and aortoiliac atherosclerosis.  Mild cardiomegaly. 5. Cholelithiasis. 6. Generalized muscular atrophy. 7. Lumbar spondylosis and degenerative disc disease causing impingement at L3-4, L4-5, and L5-S1. Electronically Signed   By: Van Clines M.D.   On: 09/14/2015 18:49   US Abdomen Limited Ruq  Result Date: 09/16/2015 CLINICAL DATA:  Right upper quadrant abdominal pain for 1 week. EXAM: US ABDOMEN LIMITED - RIGHT UPPER QUADRANT COMPARISON:  CT scan of September 14, 2015. FINDINGS: Gallbladder: 8 mm gallstone is noted without gallbladder wall thickening or pericholecystic fluid. No sonographic Murphy's sign is noted. Common bile duct: Diameter: 5 mm which is within normal limits. Liver: No focal lesion identified. Within normal limits in parenchymal echogenicity. IMPRESSION: Cholelithiasis without gallbladder wall thickening or pericholecystic fluid. If there is clinical concern for cholecystitis, HIDA scan may be performed further evaluation. Electronically Signed   By: Marijo Conception, M.D.   On: 09/16/2015 12:04   Scheduled Meds: . ALPRAZolam  1 mg Oral BID  . busPIRone  10 mg Oral q morning - 10a  . chlorproMAZINE  25 mg Oral BID  . escitalopram  20 mg Oral QHS  . heparin  5,000 Units Subcutaneous Q8H  . levothyroxine  50 mcg Oral QAC breakfast  . OLANZapine  20 mg Oral Daily  . oxybutynin  10 mg Oral q morning - 10a  . pantoprazole  40 mg Oral Daily  . zolpidem  5 mg Oral QHS   Continuous Infusions: . sodium chloride 100 mL/hr at 09/16/15 1027    LOS: 0 days   Time spent: 23 minutes   Mir Hollice Gong,  MD [facility] If 7PM-7AM, please contact night-coverage [email or web address] Password [email or web address] 09/16/2015, 2:12 PM

## 2015-09-16 NOTE — Progress Notes (Signed)
Patient ID: Rachel Morgan, female   DOB: 1950-02-23, 65 y.o.   MRN: [id number] Chief Complaint:  Right sided abdominal pain  History of Present Illness:  Rachel Morgan is an 65 y.o. female was transferred from [facility] with left UPJ obstruction and gallstones.  She lives with her sister in [place].  She has complained of right sided abdominal pain and CT 2 days ago showed gallstones which had been noted on ultrasound in 2013.    Because of her mental status and communication issues it is more difficult to assess her pain.  Will order an ultrasound to look at her gallbladder and CBD.   Past Medical History:  Diagnosis Date  . Anxiety   . Diabetes mellitus   . Gall stones   . High cholesterol   . Hypertension   . Kidney stones   . Kidney stones   . MR (mental retardation)   . Thyroid disease     History reviewed. No pertinent surgical history.  Current Facility-Administered Medications  Medication Dose Route Frequency Provider Last Rate Last Dose  . 0.45 % sodium chloride infusion   Intravenous Continuous Franchot Gallo, MD 100 mL/hr at 09/16/15 1027    . acetaminophen (TYLENOL) tablet 650 mg  650 mg Oral Q6H PRN Vianne Bulls, MD       Or  . acetaminophen (TYLENOL) suppository 650 mg  650 mg Rectal Q6H PRN Vianne Bulls, MD      . ALPRAZolam Duanne Moron) tablet 1 mg  1 mg Oral BID Vianne Bulls, MD   1 mg at 09/16/15 1030  . bisacodyl (DULCOLAX) EC tablet 5 mg  5 mg Oral Daily PRN Vianne Bulls, MD      . busPIRone (BUSPAR) tablet 10 mg  10 mg Oral q morning - 10a Vianne Bulls, MD   10 mg at 09/16/15 1030  . chlorproMAZINE (THORAZINE) tablet 25 mg  25 mg Oral BID Vianne Bulls, MD   25 mg at 09/15/15 2219  . escitalopram (LEXAPRO) tablet 20 mg  20 mg Oral QHS Vianne Bulls, MD   20 mg at 09/15/15 2219  . fentaNYL (SUBLIMAZE) injection 12.5 mcg  12.5 mcg Intravenous Q2H PRN Vianne Bulls, MD      . heparin injection 5,000 Units  5,000 Units Subcutaneous Q8H Vianne Bulls, MD   5,000 Units at 09/15/15 2219  . levothyroxine (SYNTHROID, LEVOTHROID) tablet 50 mcg  50 mcg Oral QAC breakfast Vianne Bulls, MD   50 mcg at 09/16/15 0819  . OLANZapine (ZYPREXA) tablet 20 mg  20 mg Oral Daily Vianne Bulls, MD   20 mg at 09/15/15 1042  . ondansetron (ZOFRAN) tablet 4 mg  4 mg Oral Q6H PRN Vianne Bulls, MD       Or  . ondansetron (ZOFRAN) injection 4 mg  4 mg Intravenous Q6H PRN Vianne Bulls, MD      . oxybutynin (DITROPAN-XL) 24 hr tablet 10 mg  10 mg Oral q morning - 10a Vianne Bulls, MD   10 mg at 09/16/15 1031  . pantoprazole (PROTONIX) EC tablet 40 mg  40 mg Oral Daily Vianne Bulls, MD   40 mg at 09/16/15 1030  . polyethylene glycol (MIRALAX / GLYCOLAX) packet 17 g  17 g Oral Daily PRN Vianne Bulls, MD      . traMADol (ULTRAM) tablet 50 mg  50 mg Oral Q6H PRN Vianne Bulls, MD  50 mg at 09/15/15 1836  . zolpidem (AMBIEN) tablet 5 mg  5 mg Oral QHS Vianne Bulls, MD   5 mg at 09/15/15 2219   [place] History reviewed. No pertinent family history. Social History:   reports that she has never smoked. She has never used smokeless tobacco. She reports that she does not drink alcohol or use drugs.   REVIEW OF SYSTEMS : Negative except for see problem list  Physical Exam:   Blood pressure 115/74, pulse 65, temperature 97.8 F (36.6 C), temperature source Oral, resp. rate 18, height 5' (1.524 m), weight 120.9 kg (266 lb 8.6 oz), SpO2 100 %. Body mass index is 52.05 kg/m.  Gen:  WDWN WF NAD  Complaining of some right upper quadrant pain.  No rebound or guarding LABORATORY RESULTS: Results for orders placed or performed during the hospital encounter of 09/14/15 (from the past 48 hour(s))  Urinalysis, Routine w reflex microscopic     Status: Abnormal   Collection Time: 09/14/15  4:52 PM  Result Value Ref Range   Color, Urine YELLOW YELLOW   APPearance CLEAR CLEAR   Specific Gravity, Urine >1.030 (H) 1.005 - 1.030   pH 5.5 5.0 - 8.0    Glucose, UA NEGATIVE NEGATIVE mg/dL   Hgb urine dipstick NEGATIVE NEGATIVE   Bilirubin Urine NEGATIVE NEGATIVE   Ketones, ur NEGATIVE NEGATIVE mg/dL   Protein, ur NEGATIVE NEGATIVE mg/dL   Nitrite NEGATIVE NEGATIVE   Leukocytes, UA NEGATIVE NEGATIVE    Comment: MICROSCOPIC NOT DONE ON URINES WITH NEGATIVE PROTEIN, BLOOD, LEUKOCYTES, NITRITE, OR GLUCOSE <1000 mg/dL.  Lipase, blood     Status: None   Collection Time: 09/14/15  5:08 PM  Result Value Ref Range   Lipase 12 11 - 51 U/L  Comprehensive metabolic panel     Status: Abnormal   Collection Time: 09/14/15  5:08 PM  Result Value Ref Range   Sodium 139 135 - 145 mmol/L   Potassium 4.0 3.5 - 5.1 mmol/L   Chloride 107 101 - 111 mmol/L   CO2 21 (L) 22 - 32 mmol/L   Glucose, Bld 123 (H) 65 - 99 mg/dL   BUN 26 (H) 6 - 20 mg/dL   Creatinine, Ser 1.37 (H) 0.44 - 1.00 mg/dL   Calcium 9.5 8.9 - 10.3 mg/dL   Total Protein 8.0 6.5 - 8.1 g/dL   Albumin 4.3 3.5 - 5.0 g/dL   AST 22 15 - 41 U/L   ALT 20 14 - 54 U/L   Alkaline Phosphatase 82 38 - 126 U/L   Total Bilirubin 0.4 0.3 - 1.2 mg/dL   GFR calc non Af Amer 40 (L) >60 mL/min   GFR calc Af Amer 46 (L) >60 mL/min    Comment: (NOTE) The eGFR has been calculated using the CKD EPI equation. This calculation has not been validated in all clinical situations. eGFR's persistently <60 mL/min signify possible Chronic Kidney Disease.    Anion gap 11 5 - 15  CBC     Status: None   Collection Time: 09/14/15  5:08 PM  Result Value Ref Range   WBC 7.3 4.0 - 10.5 K/uL   RBC 4.39 3.87 - 5.11 MIL/uL   Hemoglobin 13.3 12.0 - 15.0 g/dL   HCT 39.9 36.0 - 46.0 %   MCV 90.9 78.0 - 100.0 fL   MCH 30.3 26.0 - 34.0 pg   MCHC 33.3 30.0 - 36.0 g/dL   RDW 13.3 11.5 - 15.5 %   Platelets 214  150 - 400 K/uL  Comprehensive metabolic panel     Status: Abnormal   Collection Time: 09/15/15  6:15 AM  Result Value Ref Range   Sodium 140 135 - 145 mmol/L   Potassium 3.8 3.5 - 5.1 mmol/L   Chloride 110 101 -  111 mmol/L   CO2 23 22 - 32 mmol/L   Glucose, Bld 102 (H) 65 - 99 mg/dL   BUN 21 (H) 6 - 20 mg/dL   Creatinine, Ser 1.25 (H) 0.44 - 1.00 mg/dL   Calcium 9.0 8.9 - 10.3 mg/dL   Total Protein 7.3 6.5 - 8.1 g/dL   Albumin 3.9 3.5 - 5.0 g/dL   AST 20 15 - 41 U/L   ALT 20 14 - 54 U/L   Alkaline Phosphatase 75 38 - 126 U/L   Total Bilirubin 0.4 0.3 - 1.2 mg/dL   GFR calc non Af Amer 45 (L) >60 mL/min   GFR calc Af Amer 52 (L) >60 mL/min    Comment: (NOTE) The eGFR has been calculated using the CKD EPI equation. This calculation has not been validated in all clinical situations. eGFR's persistently <60 mL/min signify possible Chronic Kidney Disease.    Anion gap 7 5 - 15  Protime-INR     Status: None   Collection Time: 09/15/15  6:15 AM  Result Value Ref Range   Prothrombin Time 13.5 11.4 - 15.2 seconds   INR 1.03   Glucose, capillary     Status: None   Collection Time: 09/15/15  8:21 AM  Result Value Ref Range   Glucose-Capillary 89 65 - 99 mg/dL  Comprehensive metabolic panel     Status: Abnormal   Collection Time: 09/16/15  5:33 AM  Result Value Ref Range   Sodium 141 135 - 145 mmol/L   Potassium 3.9 3.5 - 5.1 mmol/L   Chloride 110 101 - 111 mmol/L   CO2 24 22 - 32 mmol/L   Glucose, Bld 90 65 - 99 mg/dL   BUN 17 6 - 20 mg/dL   Creatinine, Ser 1.18 (H) 0.44 - 1.00 mg/dL   Calcium 8.9 8.9 - 10.3 mg/dL   Total Protein 6.8 6.5 - 8.1 g/dL   Albumin 3.5 3.5 - 5.0 g/dL   AST 19 15 - 41 U/L   ALT 18 14 - 54 U/L   Alkaline Phosphatase 69 38 - 126 U/L   Total Bilirubin 0.4 0.3 - 1.2 mg/dL   GFR calc non Af Amer 48 (L) >60 mL/min   GFR calc Af Amer 56 (L) >60 mL/min    Comment: (NOTE) The eGFR has been calculated using the CKD EPI equation. This calculation has not been validated in all clinical situations. eGFR's persistently <60 mL/min signify possible Chronic Kidney Disease.    Anion gap 7 5 - 15  CBC     Status: None   Collection Time: 09/16/15  5:33 AM  Result Value  Ref Range   WBC 5.8 4.0 - 10.5 K/uL   RBC 4.10 3.87 - 5.11 MIL/uL   Hemoglobin 12.2 12.0 - 15.0 g/dL   HCT 37.3 36.0 - 46.0 %   MCV 91.0 78.0 - 100.0 fL   MCH 29.8 26.0 - 34.0 pg   MCHC 32.7 30.0 - 36.0 g/dL   RDW 13.8 11.5 - 15.5 %   Platelets 198 150 - 400 K/uL  Glucose, capillary     Status: None   Collection Time: 09/16/15  8:08 AM  Result Value Ref Range  Glucose-Capillary 87 65 - 99 mg/dL   Comment 1 Notify RN    Comment 2 Document in Chart      RADIOLOGY RESULTS: Ct Abdomen Pelvis Wo Contrast  Result Date: 09/14/2015 CLINICAL DATA:  Pt sent from home referred by [facility] due to pain in RUQ for one week. Family reports that she has loss of appetite and constipation. EXAM: CT ABDOMEN AND PELVIS WITHOUT CONTRAST TECHNIQUE: Multidetector CT imaging of the abdomen and pelvis was performed following the standard protocol without IV contrast. COMPARISON:  07/25/2015 FINDINGS: Despite efforts by the technologist and patient, motion artifact is present on today's exam and could not be eliminated. This reduces exam sensitivity and specificity. Lower chest: Coronary artery atherosclerosis. Mild enlargement of the cardiopericardial silhouette gas-filled distal esophagus, significance uncertain. Hepatobiliary: Numerous gallstones in the gallbladder measuring up to approximately 0.8 cm. Punctate calcification in segment 5 of the liver, image 26/2, unchanged, likely due to remote inflammation. Mild chronic prominence of the extrahepatic biliary tree at 10 mm diameter. Pancreas: Diffuse fatty atrophy of the pancreas. Spleen: Unremarkable Adrenals/Urinary Tract: Adrenal glands normal. Bilateral nonobstructive nephrolithiasis. On the left side there is prominent cortical thinning, hydronephrosis, and a 1.7 cm in long axis UPJ stone which is likely obstructive. The thinning of the cortex suggests chronic obstruction with resulting renal atrophy. The previous left renal pelvis calculus appears  to of migrated back into the lower pole of the left kidney along with several other calculi in this vicinity. No ureteral or bladder calculus is seen. Stomach/Bowel: There is prominence in stool in the proximal 2/3 of the colon. Appendix normal. Vascular/Lymphatic: Mild aortoiliac atherosclerotic vascular disease. Small periaortic lymph nodes observed. No overtly pathologic adenopathy observed. Reproductive: Unremarkable Other: No supplemental non-categorized findings. Musculoskeletal: Abdominal wall laxity. Generalized muscular atrophy for age. Chronic calcifications in both distal iliopsoas tendons. Exaggerated lumbar lordosis with grade 1 degenerative anterolisthesis at L4-5, and lumbar spondylosis and degenerative disc disease causing impingement at L3-4, L4-5, and L5-S1. IMPRESSION: 1. Mild dilatation of the CBD, at about 10 mm. This is stable from 07/25/2015 but increased from 02/12/2011. I do not directly see choledocholithiasis although CT is relatively insensitive for such, especially with the degree of motion artifact shown on today's exam. 2. Bilateral renal calculi. 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 3. Mild constipation with prominence of stool in the proximal 2/3 of the colon. 4. Coronary and aortoiliac atherosclerosis.  Mild cardiomegaly. 5. Cholelithiasis. 6. Generalized muscular atrophy. 7. Lumbar spondylosis and degenerative disc disease causing impingement at L3-4, L4-5, and L5-S1. Electronically Signed   By: Van Clines M.D.   On: 09/14/2015 18:49    Problem List: Patient Active Problem List   Diagnosis Date Noted  . Dilated cbd, acquired 09/14/2015  . Hypothyroidism 09/14/2015  . Hypertension 09/14/2015  . Depression with anxiety 09/14/2015  . UPJ obstruction, acquired 09/14/2015  . CKD  (chronic kidney disease), stage III 09/14/2015  . GERD (gastroesophageal reflux disease) 09/14/2015  . Cholelithiasis 09/14/2015  . Calculus of gallbladder without cholecystitis without obstruction   . Kidney stone on left side   . Abdominal pain, right upper quadrant 02/11/2011  . Pyelonephritis 02/11/2011  . Mental retardation 02/11/2011  . Agoraphobia 02/11/2011  . Acute renal failure ([place]) 02/11/2011  . Hyponatremia 02/11/2011  . Dehydration 02/11/2011    Assessment &  Plan: Will get ultrasound to assess current state of gallbladder and potential need for surgery.      Matt B. Hassell Done, MD, [facility] [phone] beeper [phone]  09/16/2015 11:08 AM

## 2015-09-17 DIAGNOSIS — N135 Crossing vessel and stricture of ureter without hydronephrosis: Secondary | ICD-10-CM | POA: Diagnosis not present

## 2015-09-17 DIAGNOSIS — K8020 Calculus of gallbladder without cholecystitis without obstruction: Secondary | ICD-10-CM | POA: Diagnosis not present

## 2015-09-17 LAB — GLUCOSE, CAPILLARY: GLUCOSE-CAPILLARY: 90 mg/dL (ref 65–99)

## 2015-09-17 NOTE — Care Management Note (Addendum)
09/19/2015 1036 Did follow up with [facility] and pt is active. Confirmed fax number. Faxed orders and dc summary. Jonnie Finner RN CCM Case Mgmt phone [phone]   Case Management Note  Patient Details  Name: JAYDE MCALLISTER MRN: [id number] Date of Birth: December 25, 1950  Subjective/Objective:  UPJ obstruction, dilated CBD                  Action/Plan: Discharge Planning: AVS reviewed:   NCM spoke to pt's sister, [name] at bedside. 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 HH RN is with [facility], #  fax # [phone]. Will fax orders, dc summary and facesheet. Called [facility] for transport  Miguel Rota MD    Expected Discharge Date:  09/17/2015             Expected Discharge Plan:  [place]  In-House Referral:  NA  Discharge planning Services  CM Consult  Post Acute Care Choice:  NA Choice offered to:  NA  DME Arranged:  N/A DME Agency:  NA  HH Arranged:  RN [place] Agency:  [facility]  Status of Service:  Completed, signed off  If discussed at [place] of Stay Meetings, dates discussed:    Additional Comments:  Erenest Rasher, RN 09/17/2015, 1:42 PM

## 2015-09-17 NOTE — Progress Notes (Signed)
I had a long talk with the patient's sister last night.  She was questioning why ureteroscopic procedure is not going to be done.  I explained  to her that the patient was transferred here for probable cholecystectomy, and that stent placement would beperformed at the same time if she had an anesthetic.  To my knowledge, in speaking with Dr. Hassell Done, the patient will not be having a cholecystectomy.  Therefore, anesthetic will not be performed during this hospitalization.  Management of her left sided stone burden and be performed on an outpatient basis at another time.  We will follow her up as an outpatient.

## 2015-09-17 NOTE — Progress Notes (Signed)
Patient ID: Rachel Morgan, female   DOB: November 03, 1950, 65 y.o.   MRN: [id number]  Ultrasound: 8 mm gallstone is noted without gallbladder wall thickening or pericholecystic fluid. No sonographic Murphy's sign is noted.   [facility] Progress Note:   * No surgery found *  Subjective: Mental status is baseline Objective: Vital signs in last 24 hours: Temp:  [97.7 F (36.5 C)-98 F (36.7 C)] 97.7 F (36.5 C) (09/10 0701) Pulse Rate:  [58-64] 58 (09/10 0701) Resp:  [16-18] 18 (09/10 0701) BP: (122-142)/(43-52) 142/52 (09/10 0701) SpO2:  [99 %-100 %] 99 % (09/10 0701) Weight:  [120 kg (264 lb 8.8 oz)] 120 kg (264 lb 8.8 oz) (09/10 0500)  Intake/Output from previous day: 09/09 0701 - 09/10 0700 In: 2038.5 [P.O.:120; I.V.:1918.5] Out: -  Intake/Output this shift: No intake/output data recorded.  Physical Exam: Work of breathing is not labored.  No apparent pain.  Eating OK  Lab Results:  Results for orders placed or performed during the hospital encounter of 09/14/15 (from the past 48 hour(s))  Glucose, capillary     Status: None   Collection Time: 09/15/15  8:21 AM  Result Value Ref Range   Glucose-Capillary 89 65 - 99 mg/dL  Comprehensive metabolic panel     Status: Abnormal   Collection Time: 09/16/15  5:33 AM  Result Value Ref Range   Sodium 141 135 - 145 mmol/L   Potassium 3.9 3.5 - 5.1 mmol/L   Chloride 110 101 - 111 mmol/L   CO2 24 22 - 32 mmol/L   Glucose, Bld 90 65 - 99 mg/dL   BUN 17 6 - 20 mg/dL   Creatinine, Ser 1.18 (H) 0.44 - 1.00 mg/dL   Calcium 8.9 8.9 - 10.3 mg/dL   Total Protein 6.8 6.5 - 8.1 g/dL   Albumin 3.5 3.5 - 5.0 g/dL   AST 19 15 - 41 U/L   ALT 18 14 - 54 U/L   Alkaline Phosphatase 69 38 - 126 U/L   Total Bilirubin 0.4 0.3 - 1.2 mg/dL   GFR calc non Af Amer 48 (L) >60 mL/min   GFR calc Af Amer 56 (L) >60 mL/min    Comment: (NOTE) The eGFR has been calculated using the CKD EPI equation. This calculation has not been validated in  all clinical situations. eGFR's persistently <60 mL/min signify possible Chronic Kidney Disease.    Anion gap 7 5 - 15  CBC     Status: None   Collection Time: 09/16/15  5:33 AM  Result Value Ref Range   WBC 5.8 4.0 - 10.5 K/uL   RBC 4.10 3.87 - 5.11 MIL/uL   Hemoglobin 12.2 12.0 - 15.0 g/dL   HCT 37.3 36.0 - 46.0 %   MCV 91.0 78.0 - 100.0 fL   MCH 29.8 26.0 - 34.0 pg   MCHC 32.7 30.0 - 36.0 g/dL   RDW 13.8 11.5 - 15.5 %   Platelets 198 150 - 400 K/uL  Glucose, capillary     Status: None   Collection Time: 09/16/15  8:08 AM  Result Value Ref Range   Glucose-Capillary 87 65 - 99 mg/dL   Comment 1 Notify RN    Comment 2 Document in Chart   Glucose, capillary     Status: None   Collection Time: 09/17/15  7:43 AM  Result Value Ref Range   Glucose-Capillary 90 65 - 99 mg/dL   Comment 1 Notify RN    Comment 2 Document in Chart  Radiology/Results: US Abdomen Limited Ruq  Result Date: 09/16/2015 CLINICAL DATA:  Right upper quadrant abdominal pain for 1 week. EXAM: US ABDOMEN LIMITED - RIGHT UPPER QUADRANT COMPARISON:  CT scan of September 14, 2015. FINDINGS: Gallbladder: 8 mm gallstone is noted without gallbladder wall thickening or pericholecystic fluid. No sonographic Murphy's sign is noted. Common bile duct: Diameter: 5 mm which is within normal limits. Liver: No focal lesion identified. Within normal limits in parenchymal echogenicity. IMPRESSION: Cholelithiasis without gallbladder wall thickening or pericholecystic fluid. If there is clinical concern for cholecystitis, HIDA scan may be performed further evaluation. Electronically Signed   By: Marijo Conception, M.D.   On: 09/16/2015 12:04    Anti-infectives: Anti-infectives    None      Assessment/Plan: Problem List: Patient Active Problem List   Diagnosis Date Noted  . Dilated cbd, acquired 09/14/2015  . Hypothyroidism 09/14/2015  . Hypertension 09/14/2015  . Depression with anxiety 09/14/2015  . UPJ obstruction,  acquired 09/14/2015  . CKD (chronic kidney disease), stage III 09/14/2015  . GERD (gastroesophageal reflux disease) 09/14/2015  . Cholelithiasis 09/14/2015  . Calculus of gallbladder without cholecystitis without obstruction   . Kidney stone on left side   . Abdominal pain, right upper quadrant 02/11/2011  . Pyelonephritis 02/11/2011  . Mental retardation 02/11/2011  . Agoraphobia 02/11/2011  . Acute renal failure ([place]) 02/11/2011  . Hyponatremia 02/11/2011  . Dehydration 02/11/2011    Ultrasound showed no evidence of cholecystitis.  Would refer back to Dr. Laural Golden in [place] for lower GI evaluation to include colonoscopy.   OK for discharge from our standpoint.  * No surgery found *    LOS: 0 days   Matt B. Hassell Done, MD, [facility] [phone] beeper [phone]  09/17/2015 8:17 AM

## 2015-09-17 NOTE — Discharge Summary (Signed)
Discharge Summary  Rachel Morgan UXL:[id number] DOB: [birth year]  PCP: Marval Regal, MD  Admit date: 09/14/2015 Discharge date: 09/17/2015   Recommendations for Outpatient Follow-up:  1. GI 1-2 weeks 2. PCP 1-2 weeks 3. Urology regarding renal stone 1-2 weeks   Discharge Diagnoses:  Active Hospital Problems   Diagnosis Date Noted  . UPJ obstruction, acquired 09/14/2015  . Dilated cbd, acquired 09/14/2015  . Hypothyroidism 09/14/2015  . Depression with anxiety 09/14/2015  . CKD (chronic kidney disease), stage III 09/14/2015  . GERD (gastroesophageal reflux disease) 09/14/2015  . Cholelithiasis 09/14/2015  . Calculus of gallbladder without cholecystitis without obstruction   . Kidney stone on left side   . Abdominal pain, right upper quadrant 02/11/2011  . Mental retardation 02/11/2011  . Agoraphobia 02/11/2011    Resolved Hospital Problems   Diagnosis Date Noted Date Resolved  No resolved problems to display.    Discharge Condition: Stable   Diet recommendation: Regular as tolerated.   Vitals:   09/16/15 2056 09/17/15 0701  BP: (!) 128/43 (!) 142/52  Pulse: 60 (!) 58  Resp: 18 18  Temp: 97.8 F (36.6 C) 97.7 F (36.5 C)    History of present illness:  65 yo F with a hx of anxiety, DM, HLD, HTN, hypothyroidism, GERD, and CKD stage III presented with complaints of RUQ abdominal pain and loss of appetite.  While in the ED, CT of the abdomen/pelvis revealed bilateral renal calculi. On the left there is a 1.7cm long UPJ stone with hydronephrosis, as well as cortical thinning of the left kidneys possibly related to UPJ obstruction. She was also noted to have cholelithiasis and a dilated CBD. She was transfer to [facility] for urology evaluation.  Hospital Course:  Principal Problem:   UPJ obstruction, acquired Active Problems:   Abdominal pain, right upper quadrant   Mental retardation   Agoraphobia   Dilated cbd, acquired   Hypothyroidism   Depression with anxiety   CKD (chronic kidney disease), stage III   GERD (gastroesophageal reflux disease)   Cholelithiasis   Calculus of gallbladder without cholecystitis without obstruction   Kidney stone on left side  She was kept NPO, pain was controlled and was seen by GU and surgery. She had RUQ [place] with stones but no evidence of cholecystitis. No plans for surgery at this time, surgery recommends outpatient follow up with gastroenterology. Urology was standing by to place double J stent if she would undergo lap chole, but since this not planned they recommend discharge with outpatient follow up for treatment of stone. Patient has been tolerating diet this morning, not verbal and no family present during my visit but exam is benign and she will be discharged home. I will communicate with her sister today before she is discharged.  Procedures:  [place] RUQ 9/9   Consultations:  GU  Surgery   Discharge Exam: BP (!) 142/52 (BP Location: Right Wrist)   Pulse (!) 58   Temp 97.7 F (36.5 C) (Axillary)   Resp 18   Ht 5' (1.524 m)   Wt 120 kg (264 lb 8.8 oz)   SpO2 99%   BMI 51.67 kg/m  General:  Alert, calm, in no acute distress  Eyes: EOMI, strabismus Neck: supple, no masses, trachea mildline  Cardiovascular: RRR, no murmurs or rubs, no peripheral edema  Respiratory: clear to auscultation bilaterally, no wheezes, no crackles  Abdomen: soft, nontender, nondistended, normal bowel tones heard  Skin: dry, no rashes  Musculoskeletal: no joint effusions, normal range of  motion  Psychiatric: appropriate affect, no speech Neurologic: extraocular muscles intact, moving all extremities    Discharge Instructions You were cared for by a hospitalist during your hospital stay. If you have any questions about your discharge medications or the care you received while you were in the hospital after you are discharged, you can call the unit and asked to speak with the hospitalist on call if the hospitalist that took care  of you is not available. Once you are discharged, your primary care physician will handle any further medical issues. Please note that NO REFILLS for any discharge medications will be authorized once you are discharged, as it is imperative that you return to your primary care physician (or establish a relationship with a primary care physician if you do not have one) for your aftercare needs so that they can reassess your need for medications and monitor your lab values.  Discharge Instructions    Diet - low sodium heart healthy    Complete by:  As directed   Increase activity slowly    Complete by:  As directed       Medication List    TAKE these medications   ALPRAZolam 1 MG tablet Commonly known as:  XANAX Take 1 mg by mouth 2 (two) times daily.   aspirin EC 81 MG tablet Take 81 mg by mouth every morning.   busPIRone 10 MG tablet Commonly known as:  BUSPAR Take 10 mg by mouth every morning.   chlorproMAZINE 25 MG tablet Commonly known as:  THORAZINE Take 25 mg by mouth 2 (two) times daily.   escitalopram 20 MG tablet Commonly known as:  LEXAPRO Take 20 mg by mouth at bedtime.   levothyroxine 50 MCG tablet Commonly known as:  SYNTHROID, LEVOTHROID Take 50 mcg by mouth daily before breakfast.   OLANZapine 20 MG tablet Commonly known as:  ZYPREXA Take 20 mg by mouth daily.   omeprazole 20 MG capsule Commonly known as:  PRILOSEC Take 20 mg by mouth every morning.   oxybutynin 10 MG 24 hr tablet Commonly known as:  DITROPAN-XL Take 10 mg by mouth every morning.   spironolactone 50 MG tablet Commonly known as:  ALDACTONE Take 50 mg by mouth 2 (two) times daily.   traMADol 50 MG tablet Commonly known as:  ULTRAM Take 50 mg by mouth every 6 (six) hours as needed for severe pain.   zolpidem 10 MG tablet Commonly known as:  AMBIEN Take 10 mg by mouth at bedtime.      Allergies  Allergen Reactions  . Penicillins    Follow-up Information    [facility]  [place]. Schedule an appointment as soon as possible for a visit today.   Why:  Followup with Dr Jeffie Pollock in [place] or with Dr Junious Silk in [facility] information: [address], [address] [place] [address] [phone]       Marval Regal, MD Follow up in 2 week(s).   Specialty:  Family Medicine Contact information: [address] [place] [address] [phone]        Hildred Laser, MD Follow up in 2 week(s).   Specialty:  Gastroenterology Contact information: [place], [address] [place] [address] [phone]            The results of significant diagnostics from this hospitalization (including imaging, microbiology, ancillary and laboratory) are listed below for reference.    Significant Diagnostic Studies: Ct Abdomen Pelvis Wo Contrast  Result  Date: 09/14/2015 CLINICAL DATA:  Pt sent from home referred by [facility] due to pain in RUQ for one week. Family reports that she has loss of appetite and constipation. EXAM: CT ABDOMEN AND PELVIS WITHOUT CONTRAST TECHNIQUE: Multidetector CT imaging of the abdomen and pelvis was performed following the standard protocol without IV contrast. COMPARISON:  07/25/2015 FINDINGS: Despite efforts by the technologist and patient, motion artifact is present on today's exam and could not be eliminated. This reduces exam sensitivity and specificity. Lower chest: Coronary artery atherosclerosis. Mild enlargement of the cardiopericardial silhouette gas-filled distal esophagus, significance uncertain. Hepatobiliary: Numerous gallstones in the gallbladder measuring up to approximately 0.8 cm. Punctate calcification in segment 5 of the liver, image 26/2, unchanged, likely due to remote inflammation. Mild chronic prominence of the extrahepatic biliary tree at 10 mm diameter. Pancreas: Diffuse fatty atrophy of the pancreas. Spleen: Unremarkable Adrenals/Urinary Tract: Adrenal glands normal. Bilateral  nonobstructive nephrolithiasis. On the left side there is prominent cortical thinning, hydronephrosis, and a 1.7 cm in long axis UPJ stone which is likely obstructive. The thinning of the cortex suggests chronic obstruction with resulting renal atrophy. The previous left renal pelvis calculus appears to of migrated back into the lower pole of the left kidney along with several other calculi in this vicinity. No ureteral or bladder calculus is seen. Stomach/Bowel: There is prominence in stool in the proximal 2/3 of the colon. Appendix normal. Vascular/Lymphatic: Mild aortoiliac atherosclerotic vascular disease. Small periaortic lymph nodes observed. No overtly pathologic adenopathy observed. Reproductive: Unremarkable Other: No supplemental non-categorized findings. Musculoskeletal: Abdominal wall laxity. Generalized muscular atrophy for age. Chronic calcifications in both distal iliopsoas tendons. Exaggerated lumbar lordosis with grade 1 degenerative anterolisthesis at L4-5, and lumbar spondylosis and degenerative disc disease causing impingement at L3-4, L4-5, and L5-S1. IMPRESSION: 1. Mild dilatation of the CBD, at about 10 mm. This is stable from 07/25/2015 but increased from 02/12/2011. I do not directly see choledocholithiasis although CT is relatively insensitive for such, especially with the degree of motion artifact shown on today's exam. 2. Bilateral renal calculi. 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 3. Mild constipation with prominence of stool in the proximal 2/3 of the colon. 4. Coronary and aortoiliac atherosclerosis.  Mild cardiomegaly. 5. Cholelithiasis. 6. Generalized muscular atrophy. 7. Lumbar spondylosis and degenerative disc disease causing impingement at L3-4, L4-5, and L5-S1. Electronically Signed   By: Van Clines M.D.   On: 09/14/2015 18:49   US Abdomen Limited Ruq  Result Date: 09/16/2015 CLINICAL DATA:  Right upper quadrant abdominal pain for 1 week. EXAM: US ABDOMEN LIMITED - RIGHT UPPER QUADRANT COMPARISON:  CT scan of September 14, 2015. FINDINGS: Gallbladder: 8 mm gallstone is noted without gallbladder wall thickening or pericholecystic fluid. No sonographic Murphy's sign is noted. Common bile duct: Diameter: 5 mm which is within normal limits. Liver: No focal lesion identified. Within normal limits in parenchymal echogenicity. IMPRESSION: Cholelithiasis without gallbladder wall thickening or pericholecystic fluid. If there is clinical concern for cholecystitis, HIDA scan may be performed further evaluation. Electronically Signed   By: Marijo Conception, M.D.   On: 09/16/2015 12:04    Microbiology: No results found for this or any previous visit (from the past 240 hour(s)).   Labs: Basic Metabolic Panel:  Recent Labs Lab 09/14/15 1708 09/15/15 0615 09/16/15 0533  NA 139 140 141  K 4.0 3.8 3.9  CL 107 110 110  CO2 21* 23 24  GLUCOSE 123* 102* 90  BUN 26* 21* 17  CREATININE 1.37* 1.25* 1.18*  CALCIUM 9.5 9.0 8.9   Liver Function Tests:  Recent Labs Lab 09/14/15 1708 09/15/15 0615 09/16/15 0533  AST 22 20 19   ALT 20 20 18   ALKPHOS 82 75 69  BILITOT 0.4 0.4 0.4  PROT 8.0 7.3 6.8  ALBUMIN 4.3 3.9 3.5    Recent Labs Lab 09/14/15 1708  LIPASE 12   No results for input(s): AMMONIA in the last 168 hours. CBC:  Recent Labs Lab 09/14/15 1708 09/16/15 0533  WBC 7.3 5.8  HGB 13.3 12.2  HCT 39.9 37.3  MCV 90.9 91.0  PLT 214 198   Cardiac Enzymes: No results for input(s): CKTOTAL, CKMB, CKMBINDEX, TROPONINI in the last 168 hours. BNP: BNP (last 3 results) No results for input(s): BNP in the last 8760 hours.  ProBNP (last 3 results) No results for input(s): PROBNP in the last 8760 hours.  CBG:  Recent Labs Lab 09/15/15 0821 09/16/15 0808 09/17/15 0743    GLUCAP 89 87 90    Time spent: 32 minutes were spent in preparing this discharge including medication reconciliation, counseling, and coordination of care.  Signed:  [employer]  [facility] 09/17/2015, 11:22 AM

## 2015-09-20 ENCOUNTER — Other Ambulatory Visit: Payer: Self-pay | Admitting: Urology

## 2015-09-26 ENCOUNTER — Inpatient Hospital Stay (HOSPITAL_COMMUNITY)
Admission: EM | Admit: 2015-09-26 | Discharge: 2015-09-29 | DRG: 872 | Disposition: A | Discharge status: Home / Self Care | Payer: Medicare Other | Attending: Internal Medicine | Admitting: Internal Medicine

## 2015-09-26 ENCOUNTER — Emergency Department (HOSPITAL_COMMUNITY): Payer: Medicare Other

## 2015-09-26 ENCOUNTER — Encounter (HOSPITAL_COMMUNITY)
Admission: EM | Disposition: A | Discharge status: Home / Self Care | Payer: Self-pay | Source: Home / Self Care | Attending: Internal Medicine

## 2015-09-26 ENCOUNTER — Inpatient Hospital Stay (HOSPITAL_COMMUNITY): Payer: Medicare Other | Admitting: Certified Registered Nurse Anesthetist

## 2015-09-26 ENCOUNTER — Encounter (HOSPITAL_COMMUNITY): Payer: Self-pay | Admitting: Emergency Medicine

## 2015-09-26 DIAGNOSIS — K8020 Calculus of gallbladder without cholecystitis without obstruction: Secondary | ICD-10-CM | POA: Diagnosis present

## 2015-09-26 DIAGNOSIS — I10 Essential (primary) hypertension: Secondary | ICD-10-CM | POA: Diagnosis present

## 2015-09-26 DIAGNOSIS — N183 Chronic kidney disease, stage 3 unspecified: Secondary | ICD-10-CM | POA: Diagnosis present

## 2015-09-26 DIAGNOSIS — G809 Cerebral palsy, unspecified: Secondary | ICD-10-CM

## 2015-09-26 DIAGNOSIS — Z881 Allergy status to other antibiotic agents status: Secondary | ICD-10-CM | POA: Diagnosis not present

## 2015-09-26 DIAGNOSIS — R109 Unspecified abdominal pain: Secondary | ICD-10-CM | POA: Diagnosis present

## 2015-09-26 DIAGNOSIS — Z79899 Other long term (current) drug therapy: Secondary | ICD-10-CM

## 2015-09-26 DIAGNOSIS — E039 Hypothyroidism, unspecified: Secondary | ICD-10-CM | POA: Diagnosis not present

## 2015-09-26 DIAGNOSIS — N200 Calculus of kidney: Secondary | ICD-10-CM | POA: Diagnosis not present

## 2015-09-26 DIAGNOSIS — E1122 Type 2 diabetes mellitus with diabetic chronic kidney disease: Secondary | ICD-10-CM | POA: Diagnosis present

## 2015-09-26 DIAGNOSIS — E860 Dehydration: Secondary | ICD-10-CM | POA: Diagnosis present

## 2015-09-26 DIAGNOSIS — Z880 Allergy status to penicillin: Secondary | ICD-10-CM | POA: Diagnosis not present

## 2015-09-26 DIAGNOSIS — I129 Hypertensive chronic kidney disease with stage 1 through stage 4 chronic kidney disease, or unspecified chronic kidney disease: Secondary | ICD-10-CM | POA: Diagnosis present

## 2015-09-26 DIAGNOSIS — N135 Crossing vessel and stricture of ureter without hydronephrosis: Secondary | ICD-10-CM | POA: Diagnosis present

## 2015-09-26 DIAGNOSIS — F329 Major depressive disorder, single episode, unspecified: Secondary | ICD-10-CM | POA: Diagnosis not present

## 2015-09-26 DIAGNOSIS — N10 Acute tubulo-interstitial nephritis: Secondary | ICD-10-CM

## 2015-09-26 DIAGNOSIS — R509 Fever, unspecified: Secondary | ICD-10-CM | POA: Insufficient documentation

## 2015-09-26 DIAGNOSIS — N136 Pyonephrosis: Secondary | ICD-10-CM | POA: Diagnosis present

## 2015-09-26 DIAGNOSIS — W1811XA Fall from or off toilet without subsequent striking against object, initial encounter: Secondary | ICD-10-CM | POA: Diagnosis not present

## 2015-09-26 DIAGNOSIS — A419 Sepsis, unspecified organism: Principal | ICD-10-CM | POA: Diagnosis present

## 2015-09-26 DIAGNOSIS — N179 Acute kidney failure, unspecified: Secondary | ICD-10-CM | POA: Diagnosis present

## 2015-09-26 DIAGNOSIS — F419 Anxiety disorder, unspecified: Secondary | ICD-10-CM | POA: Diagnosis present

## 2015-09-26 DIAGNOSIS — Z6841 Body Mass Index (BMI) 40.0 and over, adult: Secondary | ICD-10-CM

## 2015-09-26 DIAGNOSIS — I517 Cardiomegaly: Secondary | ICD-10-CM | POA: Diagnosis not present

## 2015-09-26 DIAGNOSIS — N390 Urinary tract infection, site not specified: Secondary | ICD-10-CM | POA: Insufficient documentation

## 2015-09-26 DIAGNOSIS — N111 Chronic obstructive pyelonephritis: Secondary | ICD-10-CM | POA: Diagnosis not present

## 2015-09-26 DIAGNOSIS — F71 Moderate intellectual disabilities: Secondary | ICD-10-CM | POA: Diagnosis present

## 2015-09-26 DIAGNOSIS — E7800 Pure hypercholesterolemia, unspecified: Secondary | ICD-10-CM | POA: Diagnosis present

## 2015-09-26 DIAGNOSIS — G808 Other cerebral palsy: Secondary | ICD-10-CM | POA: Diagnosis not present

## 2015-09-26 DIAGNOSIS — N201 Calculus of ureter: Secondary | ICD-10-CM

## 2015-09-26 DIAGNOSIS — K219 Gastro-esophageal reflux disease without esophagitis: Secondary | ICD-10-CM | POA: Diagnosis present

## 2015-09-26 DIAGNOSIS — N3090 Cystitis, unspecified without hematuria: Secondary | ICD-10-CM | POA: Diagnosis present

## 2015-09-26 DIAGNOSIS — Z7982 Long term (current) use of aspirin: Secondary | ICD-10-CM

## 2015-09-26 HISTORY — PX: CYSTOSCOPY W/ URETERAL STENT PLACEMENT: SHX1429

## 2015-09-26 LAB — URINALYSIS, ROUTINE W REFLEX MICROSCOPIC
GLUCOSE, UA: NEGATIVE mg/dL
Ketones, ur: NEGATIVE mg/dL
NITRITE: POSITIVE — AB
PH: 6 (ref 5.0–8.0)
PROTEIN: 100 mg/dL — AB
Specific Gravity, Urine: 1.02 (ref 1.005–1.030)

## 2015-09-26 LAB — CBC WITH DIFFERENTIAL/PLATELET
BASOS PCT: 0 %
Basophils Absolute: 0.1 10*3/uL (ref 0.0–0.1)
EOS PCT: 0 %
Eosinophils Absolute: 0 10*3/uL (ref 0.0–0.7)
HCT: 36.4 % (ref 36.0–46.0)
HEMOGLOBIN: 12 g/dL (ref 12.0–15.0)
Lymphocytes Relative: 7 %
Lymphs Abs: 1.2 10*3/uL (ref 0.7–4.0)
MCH: 29.6 pg (ref 26.0–34.0)
MCHC: 33 g/dL (ref 30.0–36.0)
MCV: 89.7 fL (ref 78.0–100.0)
MONO ABS: 2.3 10*3/uL — AB (ref 0.1–1.0)
MONOS PCT: 13 %
NEUTROS ABS: 14.3 10*3/uL — AB (ref 1.7–7.7)
Neutrophils Relative %: 80 %
PLATELETS: 267 10*3/uL (ref 150–400)
RBC: 4.06 MIL/uL (ref 3.87–5.11)
RDW: 13.6 % (ref 11.5–15.5)
WBC: 18 10*3/uL — ABNORMAL HIGH (ref 4.0–10.5)

## 2015-09-26 LAB — BASIC METABOLIC PANEL
ANION GAP: 12 (ref 5–15)
BUN: 28 mg/dL — AB (ref 6–20)
CHLORIDE: 108 mmol/L (ref 101–111)
CO2: 20 mmol/L — ABNORMAL LOW (ref 22–32)
Calcium: 8.8 mg/dL — ABNORMAL LOW (ref 8.9–10.3)
Creatinine, Ser: 1.87 mg/dL — ABNORMAL HIGH (ref 0.44–1.00)
GFR calc Af Amer: 32 mL/min — ABNORMAL LOW (ref >60)
GFR, EST NON AFRICAN AMERICAN: 28 mL/min — AB (ref >60)
Glucose, Bld: 146 mg/dL — ABNORMAL HIGH (ref 65–99)
POTASSIUM: 3.9 mmol/L (ref 3.5–5.1)
SODIUM: 140 mmol/L (ref 135–145)

## 2015-09-26 LAB — HEPATIC FUNCTION PANEL
ALK PHOS: 56 U/L (ref 38–126)
ALT: 19 U/L (ref 14–54)
AST: 18 U/L (ref 15–41)
Albumin: 2.7 g/dL — ABNORMAL LOW (ref 3.5–5.0)
BILIRUBIN DIRECT: 0.3 mg/dL (ref 0.1–0.5)
BILIRUBIN INDIRECT: 0.3 mg/dL (ref 0.3–0.9)
Total Bilirubin: 0.6 mg/dL (ref 0.3–1.2)
Total Protein: 6.2 g/dL — ABNORMAL LOW (ref 6.5–8.1)

## 2015-09-26 LAB — I-STAT CG4 LACTIC ACID, ED: LACTIC ACID, VENOUS: 1.69 mmol/L (ref 0.5–1.9)

## 2015-09-26 LAB — URINE MICROSCOPIC-ADD ON

## 2015-09-26 LAB — GLUCOSE, CAPILLARY: Glucose-Capillary: 126 mg/dL — ABNORMAL HIGH (ref 65–99)

## 2015-09-26 LAB — SURGICAL PCR SCREEN
MRSA, PCR: NEGATIVE
STAPHYLOCOCCUS AUREUS: POSITIVE — AB

## 2015-09-26 LAB — APTT: APTT: 38 s — AB (ref 24–36)

## 2015-09-26 LAB — CBG MONITORING, ED: Glucose-Capillary: 83 mg/dL (ref 65–99)

## 2015-09-26 LAB — PROTIME-INR
INR: 1.3
Prothrombin Time: 16.3 seconds — ABNORMAL HIGH (ref 11.4–15.2)

## 2015-09-26 LAB — LACTIC ACID, PLASMA
Lactic Acid, Venous: 1.2 mmol/L (ref 0.5–1.9)
Lactic Acid, Venous: 1 mmol/L (ref 0.5–1.9)

## 2015-09-26 LAB — LIPASE, BLOOD: Lipase: 11 U/L (ref 11–51)

## 2015-09-26 LAB — PROCALCITONIN: Procalcitonin: 0.57 ng/mL

## 2015-09-26 LAB — TROPONIN I

## 2015-09-26 SURGERY — CYSTOSCOPY, WITH RETROGRADE PYELOGRAM AND URETERAL STENT INSERTION
Anesthesia: General | Laterality: Bilateral

## 2015-09-26 SURGERY — CYSTOSCOPY, WITH STENT INSERTION
Anesthesia: General | Laterality: Bilateral

## 2015-09-26 MED ORDER — FENTANYL CITRATE (PF) 100 MCG/2ML IJ SOLN
INTRAMUSCULAR | Status: DC | PRN
  Administered 2015-09-26 (×2): 100 ug via INTRAVENOUS

## 2015-09-26 MED ORDER — HYDROMORPHONE HCL 1 MG/ML IJ SOLN: 0.2500 mg | INTRAMUSCULAR | Status: DC | PRN

## 2015-09-26 MED ORDER — LIDOCAINE 2% (20 MG/ML) 5 ML SYRINGE
INTRAMUSCULAR | Status: DC | PRN
  Administered 2015-09-26: 100 mg via INTRAVENOUS

## 2015-09-26 MED ORDER — VANCOMYCIN HCL IN DEXTROSE 1-5 GM/200ML-% IV SOLN: 1000.0000 mg | Freq: Once | INTRAVENOUS | Status: DC

## 2015-09-26 MED ORDER — LEVOTHYROXINE SODIUM 50 MCG PO TABS
50.0000 ug | ORAL_TABLET | Freq: Every day | ORAL | Status: DC
  Administered 2015-09-27 – 2015-09-29 (×3): 50 ug via ORAL
  Filled 2015-09-26 (×3): qty 1

## 2015-09-26 MED ORDER — AZTREONAM 1 G IJ SOLR
1.0000 g | Freq: Three times a day (TID) | INTRAMUSCULAR | Status: DC
  Administered 2015-09-27: 1 g via INTRAVENOUS
  Filled 2015-09-26 (×3): qty 1

## 2015-09-26 MED ORDER — SUCCINYLCHOLINE CHLORIDE 20 MG/ML IJ SOLN
INTRAMUSCULAR | Status: DC | PRN
  Administered 2015-09-26: 100 mg via INTRAVENOUS

## 2015-09-26 MED ORDER — VANCOMYCIN HCL IN DEXTROSE 1-5 GM/200ML-% IV SOLN
1000.0000 mg | Freq: Two times a day (BID) | INTRAVENOUS | Status: DC
  Administered 2015-09-27: 1000 mg via INTRAVENOUS
  Filled 2015-09-26: qty 200

## 2015-09-26 MED ORDER — BISACODYL 5 MG PO TBEC: 5.0000 mg | DELAYED_RELEASE_TABLET | Freq: Every day | ORAL | Status: DC | PRN

## 2015-09-26 MED ORDER — TRAMADOL HCL 50 MG PO TABS
50.0000 mg | ORAL_TABLET | Freq: Four times a day (QID) | ORAL | Status: DC | PRN
  Administered 2015-09-28 – 2015-09-29 (×2): 50 mg via ORAL
  Filled 2015-09-26 (×2): qty 1

## 2015-09-26 MED ORDER — ZOLPIDEM TARTRATE 5 MG PO TABS
5.0000 mg | ORAL_TABLET | Freq: Every day | ORAL | Status: DC
  Administered 2015-09-27 – 2015-09-28 (×2): 5 mg via ORAL
  Filled 2015-09-26 (×2): qty 1

## 2015-09-26 MED ORDER — ACETAMINOPHEN 650 MG RE SUPP: 650.0000 mg | Freq: Four times a day (QID) | RECTAL | Status: DC | PRN

## 2015-09-26 MED ORDER — LEVOFLOXACIN IN D5W 750 MG/150ML IV SOLN: 750.0000 mg | INTRAVENOUS | Status: DC

## 2015-09-26 MED ORDER — ACETAMINOPHEN 325 MG PO TABS: 650.0000 mg | ORAL_TABLET | Freq: Four times a day (QID) | ORAL | Status: DC | PRN

## 2015-09-26 MED ORDER — MEPERIDINE HCL 50 MG/ML IJ SOLN: 6.2500 mg | INTRAMUSCULAR | Status: DC | PRN

## 2015-09-26 MED ORDER — PANTOPRAZOLE SODIUM 40 MG PO TBEC
40.0000 mg | DELAYED_RELEASE_TABLET | Freq: Every day | ORAL | Status: DC
  Administered 2015-09-27 – 2015-09-29 (×3): 40 mg via ORAL
  Filled 2015-09-26 (×3): qty 1

## 2015-09-26 MED ORDER — SODIUM CHLORIDE 0.9% FLUSH
3.0000 mL | Freq: Two times a day (BID) | INTRAVENOUS | Status: DC
  Administered 2015-09-27 – 2015-09-28 (×3): 3 mL via INTRAVENOUS

## 2015-09-26 MED ORDER — LEVOFLOXACIN IN D5W 750 MG/150ML IV SOLN
750.0000 mg | Freq: Once | INTRAVENOUS | Status: AC
Start: 2015-09-26 — End: 2015-09-26
  Administered 2015-09-26: 750 mg via INTRAVENOUS
  Filled 2015-09-26: qty 150

## 2015-09-26 MED ORDER — SODIUM CHLORIDE 0.9 % IV BOLUS (SEPSIS)
1000.0000 mL | Freq: Once | INTRAVENOUS | Status: AC
  Administered 2015-09-26: 1000 mL via INTRAVENOUS

## 2015-09-26 MED ORDER — PROPOFOL 10 MG/ML IV BOLUS
INTRAVENOUS | Status: AC
  Filled 2015-09-26: qty 20

## 2015-09-26 MED ORDER — CHLORPROMAZINE HCL 25 MG PO TABS
25.0000 mg | ORAL_TABLET | Freq: Two times a day (BID) | ORAL | Status: DC
  Administered 2015-09-27 – 2015-09-29 (×5): 25 mg via ORAL
  Filled 2015-09-26 (×8): qty 1

## 2015-09-26 MED ORDER — ALPRAZOLAM 1 MG PO TABS
1.0000 mg | ORAL_TABLET | Freq: Two times a day (BID) | ORAL | Status: DC
  Administered 2015-09-27 – 2015-09-29 (×5): 1 mg via ORAL
  Filled 2015-09-26 (×5): qty 1

## 2015-09-26 MED ORDER — OLANZAPINE 10 MG PO TABS
20.0000 mg | ORAL_TABLET | Freq: Every day | ORAL | Status: DC
  Administered 2015-09-27 – 2015-09-29 (×3): 20 mg via ORAL
  Filled 2015-09-26 (×3): qty 2

## 2015-09-26 MED ORDER — FENTANYL CITRATE (PF) 100 MCG/2ML IJ SOLN
INTRAMUSCULAR | Status: AC
  Filled 2015-09-26: qty 2

## 2015-09-26 MED ORDER — ACETAMINOPHEN 650 MG RE SUPP
RECTAL | Status: AC
  Filled 2015-09-26: qty 1

## 2015-09-26 MED ORDER — SODIUM CHLORIDE 0.9 % IV SOLN: INTRAVENOUS | Status: DC

## 2015-09-26 MED ORDER — HYDROMORPHONE HCL 1 MG/ML IJ SOLN
INTRAMUSCULAR | Status: AC
  Filled 2015-09-26: qty 1

## 2015-09-26 MED ORDER — VANCOMYCIN HCL 10 G IV SOLR
1500.0000 mg | Freq: Once | INTRAVENOUS | Status: AC
  Administered 2015-09-26: 1500 mg via INTRAVENOUS
  Filled 2015-09-26: qty 1500

## 2015-09-26 MED ORDER — ESCITALOPRAM OXALATE 20 MG PO TABS
20.0000 mg | ORAL_TABLET | Freq: Every day | ORAL | Status: DC
  Administered 2015-09-27 – 2015-09-28 (×2): 20 mg via ORAL
  Filled 2015-09-26 (×2): qty 1

## 2015-09-26 MED ORDER — FAMOTIDINE IN NACL 20-0.9 MG/50ML-% IV SOLN
20.0000 mg | Freq: Two times a day (BID) | INTRAVENOUS | Status: DC
  Administered 2015-09-27: 20 mg via INTRAVENOUS
  Filled 2015-09-26: qty 50

## 2015-09-26 MED ORDER — POLYETHYLENE GLYCOL 3350 17 G PO PACK
17.0000 g | PACK | Freq: Every day | ORAL | Status: DC | PRN
  Administered 2015-09-27: 17 g via ORAL
  Filled 2015-09-26: qty 1

## 2015-09-26 MED ORDER — HEPARIN SODIUM (PORCINE) 5000 UNIT/ML IJ SOLN
5000.0000 [IU] | Freq: Three times a day (TID) | INTRAMUSCULAR | Status: DC
  Administered 2015-09-27 – 2015-09-29 (×7): 5000 [IU] via SUBCUTANEOUS
  Filled 2015-09-26 (×6): qty 1

## 2015-09-26 MED ORDER — PROPOFOL 10 MG/ML IV BOLUS
INTRAVENOUS | Status: DC | PRN
  Administered 2015-09-26: 200 mg via INTRAVENOUS

## 2015-09-26 MED ORDER — ORAL CARE MOUTH RINSE
15.0000 mL | Freq: Two times a day (BID) | OROMUCOSAL | Status: DC
  Administered 2015-09-27 – 2015-09-28 (×4): 15 mL via OROMUCOSAL

## 2015-09-26 MED ORDER — SODIUM CHLORIDE 0.9 % IV SOLN
INTRAVENOUS | Status: AC
  Administered 2015-09-26 – 2015-09-27 (×2): via INTRAVENOUS

## 2015-09-26 MED ORDER — ONDANSETRON HCL 4 MG PO TABS: 4.0000 mg | ORAL_TABLET | Freq: Four times a day (QID) | ORAL | Status: DC | PRN

## 2015-09-26 MED ORDER — OXYBUTYNIN CHLORIDE ER 5 MG PO TB24
10.0000 mg | ORAL_TABLET | Freq: Every morning | ORAL | Status: DC
  Administered 2015-09-27 – 2015-09-29 (×3): 10 mg via ORAL
  Filled 2015-09-26 (×3): qty 2

## 2015-09-26 MED ORDER — BUSPIRONE HCL 5 MG PO TABS
10.0000 mg | ORAL_TABLET | Freq: Every morning | ORAL | Status: DC
  Administered 2015-09-27 – 2015-09-29 (×3): 10 mg via ORAL
  Filled 2015-09-26 (×2): qty 1
  Filled 2015-09-26: qty 2
  Filled 2015-09-26: qty 1

## 2015-09-26 MED ORDER — SODIUM CHLORIDE 0.9 % IV SOLN
1000.0000 mL | INTRAVENOUS | Status: DC
  Administered 2015-09-26: 1000 mL via INTRAVENOUS

## 2015-09-26 MED ORDER — SODIUM CHLORIDE 0.9 % IV SOLN
INTRAVENOUS | Status: DC | PRN
  Administered 2015-09-26: 21:00:00

## 2015-09-26 MED ORDER — PROMETHAZINE HCL 25 MG/ML IJ SOLN: 6.2500 mg | INTRAMUSCULAR | Status: DC | PRN

## 2015-09-26 MED ORDER — ONDANSETRON HCL 4 MG/2ML IJ SOLN
INTRAMUSCULAR | Status: AC
  Filled 2015-09-26: qty 2

## 2015-09-26 MED ORDER — ACETAMINOPHEN 650 MG RE SUPP
650.0000 mg | Freq: Once | RECTAL | Status: AC
  Administered 2015-09-26: 650 mg via RECTAL

## 2015-09-26 MED ORDER — ONDANSETRON HCL 4 MG/2ML IJ SOLN
4.0000 mg | Freq: Four times a day (QID) | INTRAMUSCULAR | Status: DC | PRN
  Administered 2015-09-26: 4 mg via INTRAVENOUS

## 2015-09-26 MED ORDER — DEXTROSE 5 % IV SOLN
2.0000 g | Freq: Once | INTRAVENOUS | Status: AC
  Administered 2015-09-26: 2 g via INTRAVENOUS
  Filled 2015-09-26: qty 2

## 2015-09-26 SURGICAL SUPPLY — 14 items
BAG URO CATCHER STRL LF (MISCELLANEOUS) ×3 IMPLANT
BASKET ZERO TIP NITINOL 2.4FR (BASKET) IMPLANT
CATH INTERMIT  6FR 70CM (CATHETERS) IMPLANT
CLOTH BEACON ORANGE TIMEOUT ST (SAFETY) ×3 IMPLANT
GLOVE BIOGEL M STRL SZ7.5 (GLOVE) ×3 IMPLANT
GOWN STRL REUS W/TWL LRG LVL3 (GOWN DISPOSABLE) ×6 IMPLANT
GUIDEWIRE ANG ZIPWIRE 038X150 (WIRE) IMPLANT
GUIDEWIRE STR DUAL SENSOR (WIRE) ×3 IMPLANT
MANIFOLD NEPTUNE II (INSTRUMENTS) ×3 IMPLANT
PACK CYSTO (CUSTOM PROCEDURE TRAY) ×3 IMPLANT
STENT URET 6FRX24 CONTOUR (STENTS) ×6 IMPLANT
TUBE FEEDING 8FR 16IN STR KANG (MISCELLANEOUS) ×3 IMPLANT
TUBING CONNECTING 10 (TUBING) ×2 IMPLANT
TUBING CONNECTING 10' (TUBING) ×1

## 2015-09-26 NOTE — ED Provider Notes (Signed)
[facility] DEPT Provider Note   CSN: [id number] Arrival date & time: 09/26/15  1105  By signing my name below, I, Johnney Killian, attest that this documentation has been prepared under the direction and in the presence of Ezequiel Essex, MD. Electronically Signed: Johnney Killian, ED Scribe. 09/26/15. 2:20 PM.  History   Chief Complaint Chief Complaint  Patient presents with  . Fall   LEVEL 5 CAVEAT: HPI and ROS limited due to mental retardation   HPI Comments: Rachel Morgan is a 65 y.o. female with past medical history of cerebral palsy, DM who presents to the Emergency Department with relative complaining of a fall that occurred earlier today. Per relative, patient fell forward when getting off her toilet, landing on her chest. Relative denies head injury or LOC. Patient was discharge from [facility] on 09/17/15 after a 3 day stay; she was diagnosed with kidney stones but the surgeon declined to perform surgery for this at that time. Pt has had a number of recent falls (7) since her hospital stay. Relative notes occasional SOB. Pt is able to ambulate independently and does not require a walker or canePt's family member says she has a new fever today discovered on arrival to the ED. Relative typically gives patient Motrin during the day and stronger pain medication at night. According to family member, pt is at normal baseline mental status today with respect to confusion. Pt is eating and drinking without difficulty and has normal urine output. Relative denies recent vomiting. Relative denies history of abdominal surgeries, asthma, COPD, MI, or cardiac stents/catheterizations.   The history is provided by the patient and a relative. The history is limited by a developmental delay. No language interpreter was used.    Past Medical History:  Diagnosis Date  . Anxiety   . Diabetes mellitus   . Gall stones   . High cholesterol   . Hypertension   . Kidney stones   . Kidney stones   . MR  (mental retardation)   . Thyroid disease     Patient Active Problem List   Diagnosis Date Noted  . Dilated cbd, acquired 09/14/2015  . Hypothyroidism 09/14/2015  . Hypertension 09/14/2015  . Depression with anxiety 09/14/2015  . UPJ obstruction, acquired 09/14/2015  . CKD (chronic kidney disease), stage III 09/14/2015  . GERD (gastroesophageal reflux disease) 09/14/2015  . Cholelithiasis 09/14/2015  . Calculus of gallbladder without cholecystitis without obstruction   . Kidney stone on left side   . Abdominal pain, right upper quadrant 02/11/2011  . Pyelonephritis 02/11/2011  . Mental retardation 02/11/2011  . Agoraphobia 02/11/2011  . Acute renal failure ([place]) 02/11/2011  . Hyponatremia 02/11/2011  . Dehydration 02/11/2011    History reviewed. No pertinent surgical history.  OB History    No data available       Home Medications    Prior to Admission medications   Medication Sig Start Date End Date Taking? Authorizing Provider  ALPRAZolam Duanne Moron) 1 MG tablet Take 1 mg by mouth 2 (two) times daily.     Historical Provider, MD  aspirin EC 81 MG tablet Take 81 mg by mouth every morning.     Historical Provider, MD  busPIRone (BUSPAR) 10 MG tablet Take 10 mg by mouth every morning.     Historical Provider, MD  chlorproMAZINE (THORAZINE) 25 MG tablet Take 25 mg by mouth 2 (two) times daily.     Historical Provider, MD  escitalopram (LEXAPRO) 20 MG tablet Take 20 mg by mouth at  bedtime.    Historical Provider, MD  levothyroxine (SYNTHROID, LEVOTHROID) 50 MCG tablet Take 50 mcg by mouth daily before breakfast.    Historical Provider, MD  OLANZapine (ZYPREXA) 20 MG tablet Take 20 mg by mouth daily.  07/24/15   Historical Provider, MD  omeprazole (PRILOSEC) 20 MG capsule Take 20 mg by mouth every morning.     Historical Provider, MD  oxybutynin (DITROPAN-XL) 10 MG 24 hr tablet Take 10 mg by mouth every morning.     Historical Provider, MD  spironolactone (ALDACTONE) 50 MG tablet  Take 50 mg by mouth 2 (two) times daily.    Historical Provider, MD  traMADol (ULTRAM) 50 MG tablet Take 50 mg by mouth every 6 (six) hours as needed for severe pain.  09/09/15   Historical Provider, MD  zolpidem (AMBIEN) 10 MG tablet Take 10 mg by mouth at bedtime.    Historical Provider, MD    Family History History reviewed. No pertinent family history.  Social History Social History  Substance Use Topics  . Smoking status: Never Smoker  . Smokeless tobacco: Never Used  . Alcohol use No     Allergies   Penicillins   Review of Systems Review of Systems  Reason unable to perform ROS: mental retardation.   Physical Exam Updated Vital Signs BP (!) 114/50 (BP Location: Left Arm)   Pulse 100   Temp 101.9 F (38.8 C) (Rectal)   Resp 16   Ht 5' (1.524 m)   Wt 264 lb (119.7 kg)   SpO2 100%   BMI 51.56 kg/m   Physical Exam  Constitutional: She appears well-developed and well-nourished. No distress.  HENT:  Head: Normocephalic and atraumatic.  Mouth/Throat: Oropharynx is clear and moist. No oropharyngeal exudate.  Dry mucus membranes  Eyes: Conjunctivae and EOM are normal. Pupils are equal, round, and reactive to light.  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 Neck supple.  No meningismus.  Cardiovascular: Normal rate, regular rhythm, normal heart sounds and intact distal pulses.   No murmur heard. Pulmonary/Chest: Effort normal and breath sounds normal. No respiratory distress.  Abdominal: Soft. There is tenderness. There is no rebound and no guarding.  Obese Right-sided abdominal tenderness  Musculoskeletal: Normal range of motion. She exhibits tenderness. She exhibits no edema.  Bilat paraspinal tenderness No midline tenderness  Neurological: She is alert. No cranial nerve deficit. She exhibits normal muscle tone. Coordination normal.  No ataxia on finger to nose bilaterally. No pronator drift. 5/5 strength throughout. CN 2-12 intact.Equal  grip strength. Sensation intact.   Skin: Skin is warm.  Psychiatric: She has a normal mood and affect. Her behavior is normal.  Nursing note and vitals reviewed.    ED Treatments / Results   DIAGNOSTIC STUDIES: Oxygen Saturation is 100% on RA, normal by my interpretation.    COORDINATION OF CARE: 1:30 PM Discussed treatment plan with pt and family at bedside and family agreed to plan.   Labs (all labs ordered are listed, but only abnormal results are displayed) Labs Reviewed  CBC WITH DIFFERENTIAL/PLATELET - Abnormal; Notable for the following:       Result Value   WBC 18.0 (*)    Neutro Abs 14.3 (*)    Monocytes Absolute 2.3 (*)    All other components within normal limits  BASIC METABOLIC PANEL - Abnormal; Notable for the following:    CO2 20 (*)    Glucose, Bld 146 (*)    BUN 28 (*)  Creatinine, Ser 1.87 (*)    Calcium 8.8 (*)    GFR calc non Af Amer 28 (*)    GFR calc Af Amer 32 (*)    All other components within normal limits  URINALYSIS, ROUTINE W REFLEX MICROSCOPIC (NOT AT [facility]) - Abnormal; Notable for the following:    Color, Urine AMBER (*)    APPearance HAZY (*)    Hgb urine dipstick LARGE (*)    Bilirubin Urine SMALL (*)    Protein, ur 100 (*)    Nitrite POSITIVE (*)    Leukocytes, UA MODERATE (*)    All other components within normal limits  URINE MICROSCOPIC-ADD ON - Abnormal; Notable for the following:    Squamous Epithelial / LPF 0-5 (*)    Bacteria, UA MANY (*)    All other components within normal limits  HEPATIC FUNCTION PANEL - Abnormal; Notable for the following:    Total Protein 6.2 (*)    Albumin 2.7 (*)    All other components within normal limits  PROTIME-INR - Abnormal; Notable for the following:    Prothrombin Time 16.3 (*)    All other components within normal limits  APTT - Abnormal; Notable for the following:    aPTT 38 (*)    All other components within normal limits  GLUCOSE, CAPILLARY - Abnormal; Notable for the following:     Glucose-Capillary 126 (*)    All other components within normal limits  URINE CULTURE  CULTURE, BLOOD (ROUTINE X 2)  CULTURE, BLOOD (ROUTINE X 2)  SURGICAL PCR SCREEN  LACTIC ACID, PLASMA  LACTIC ACID, PLASMA  TROPONIN I  LIPASE, BLOOD  PROCALCITONIN  COMPREHENSIVE METABOLIC PANEL  CBC WITH DIFFERENTIAL/PLATELET  I-STAT CG4 LACTIC ACID, ED  CBG MONITORING, ED    EKG  EKG Interpretation None       Radiology Dg Chest 2 View  Result Date: 09/26/2015 CLINICAL DATA:  Shortness of breath and abdominal pain EXAM: CHEST  2 VIEW COMPARISON:  02/12/2011 FINDINGS: Chronic cardiomegaly. Stable mediastinal contours. Low volume chest. There is no edema, consolidation, effusion, or pneumothorax. No acute osseous finding. IMPRESSION: No acute finding. Electronically Signed   By: Monte Fantasia M.D.   On: 09/26/2015 11:46    Procedures Procedures (including critical care time)  Medications Ordered in ED Medications  acetaminophen (TYLENOL) suppository 650 mg (650 mg Rectal Given 09/26/15 1150)     Initial Impression / Assessment and Plan / ED Course  I have reviewed the triage vital signs and the nursing notes.  Pertinent labs & imaging results that were available during my care of the patient were reviewed by me and considered in my medical decision making (see chart for details).  Clinical Course  Patient presents with abdominal pain and recurrent falls.  Patient with recurrent falls since discharge from the hospital last week. Has on ongoing right-sided abdominal pain with known left UVJ stone as well as gallstones.. Febrile on arrival. Sepsis labs ordered  IV antibiotic started after blood cultures obtained. BP stable in the ED. Lactate normal. Leukocytosis and AKI.  Urinalysis appears infected. This is concerning with known UVJ stone  Leukocytosis noted. Lactate is normal. Patient started on broad-spectrum antibiotics. Discussed with Dr. Tresa Moore in urology who agrees patient  would benefit from urgent stents. Requests transfer to [facility] under hospitalist service  CT done today shows nephrolithiasis on the left and UPJ obstruction as well as apparent pyelonephritis on the right. CT head and C-spine are negative for acute  injury.  Admission to [facility] stepdown d/w Dr. Myna Hidalgo.  Patient remains stable in the ED and at baseline mentation. Family updated.  CRITICAL CARE Performed by: Ezequiel Essex Total critical care time: 35 minutes Critical care time was exclusive of separately billable procedures and treating other patients. Critical care was necessary to treat or prevent imminent or life-threatening deterioration. Critical care was time spent personally by me on the following activities: development of treatment plan with patient and/or surrogate as well as nursing, discussions with consultants, evaluation of patient's response to treatment, examination of patient, obtaining history from patient or surrogate, ordering and performing treatments and interventions, ordering and review of laboratory studies, ordering and review of radiographic studies, pulse oximetry and re-evaluation of patient's condition.   Final Clinical Impressions(s) / ED Diagnoses   Final diagnoses:  Fever  Sepsis, due to unspecified organism [facility])  Urinary tract infection without hematuria, site unspecified  Ureteral stone    New Prescriptions New Prescriptions   No medications on file   I personally performed the services described in this documentation, which was scribed in my presence. The recorded information has been reviewed and is accurate.     Ezequiel Essex, MD 09/26/15 [phone]

## 2015-09-26 NOTE — H&P (Addendum)
History and Physical    CLATIE KESSEN ZOX:[id number] DOB: 10-03-1950 DOA: 09/26/2015  PCP: [facility]   Patient coming from: [facility]   Chief Complaint: Fevers, abdominal pain, falls  HPI: Rachel Morgan is a 65 y.o. 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 Patient had recently been admitted with abdominal pain and was evaluated for her cholelithiasis and chronic left UPJ obstruction. Patient remained stable during that admission with no suggestion of infection and risks of surgical management were felt to outweigh any benefit which could be gained from that. She was also ultimately discharged back to her residence where she had been doing well until falling in the last 24 hours. She was found on the bathroom floor this morning by family, she complained of some abdominal pain and dyspnea, was suspected to be febrile, and EMS was called for transport to the hospital. There had been plans for outpatient management of the chronic left UPJ stone in approximately one month from now.   ED Course: Upon arrival to the ED, patient is found to be febrile to 38.8 C, saturating well on room air, tachycardic to the low 100s, with blood pressure 110s/50. Chemistry panel remains pending due to laboratory downtime, CBC features a leukocytosis to 18,000, lactic acid is reassuring at 1.69, and urinalysis is consistent with infection. CT of the head and cervical spine are negative for acute intracranial or spinal abnormality, though incidental notation is made of a small area of sclerosis involving the right clivus which should be followed up to exclude neoplastic disease. CT stone study reveals right perinephric edema and possible pyelonephritis with  bilateral nephrolithiasis and chronic left UPJ calculus and left hydronephrosis with cortical atrophy. 1 L of normal saline was administered as a bolus, urine and blood cultures were obtained, and vancomycin, Levaquin, and Azactam was given empirically in the setting of sepsis from pyelonephritis with penicillin allergy. Urology was consulted by the ED physician and advised admitting the patient to [facility] for urgent treatment with bilateral ureteral stenting. Tachycardia resolved following the fluid bolus, patient remained hemodynamically stable, and will be admitted to the [facility] stepdown unit for ongoing evaluation and management of sepsis secondary to pyelonephritis, complicated by nephrolithiasis with obstruction.  Review of Systems:  All other systems reviewed and apart from HPI, are negative.  Past Medical History:  Diagnosis Date  . Anxiety   . Diabetes mellitus   . Gall stones   . High cholesterol   . Hypertension   . Kidney stones   . Kidney stones   . MR (mental retardation)   . Thyroid disease     History reviewed. No pertinent surgical history.   reports that she has never smoked. She has never used smokeless tobacco. She reports that she does not drink alcohol or use drugs.  Allergies  Allergen Reactions  . Macrobid [Nitrofurantoin [employer]  . Penicillins     History reviewed. No pertinent family history.   Prior to Admission medications   Medication Sig Start Date End Date Taking? Authorizing Provider  ALPRAZolam Duanne Moron) 1 MG tablet Take 1 mg by mouth 2 (two) times daily.    Yes Historical Provider, MD  aspirin EC 81 MG tablet Take 81 mg by mouth every morning.    Yes Historical Provider, MD  busPIRone (BUSPAR) 10 MG tablet Take 10 mg by mouth every morning.    Yes Historical Provider, MD  chlorproMAZINE (THORAZINE) 25 MG tablet Take 25 mg by mouth 2 (two) times daily.    Yes Historical Provider, MD  escitalopram (LEXAPRO) 20 MG tablet  Take 20 mg by mouth at bedtime.   Yes Historical Provider, MD  levothyroxine (SYNTHROID, LEVOTHROID) 50 MCG tablet Take 50 mcg by mouth daily before breakfast.   Yes Historical Provider, MD  OLANZapine (ZYPREXA) 20 MG tablet Take 20 mg by mouth daily.  07/24/15  Yes Historical Provider, MD  omeprazole (PRILOSEC) 20 MG capsule Take 20 mg by mouth every morning.    Yes Historical Provider, MD  oxybutynin (DITROPAN-XL) 10 MG 24 hr tablet Take 10 mg by mouth every morning.    Yes Historical Provider, MD  spironolactone (ALDACTONE) 50 MG tablet Take 50 mg by mouth 2 (two) times daily.   Yes Historical Provider, MD  traMADol (ULTRAM) 50 MG tablet Take 50 mg by mouth every 6 (six) hours as needed for severe pain.  09/09/15  Yes Historical Provider, MD  zolpidem (AMBIEN) 10 MG tablet Take 10 mg by mouth at bedtime.   Yes Historical Provider, MD    Physical Exam: Vitals:   09/26/15 1507 09/26/15 1530 09/26/15 1600 09/26/15 1630  BP: (!) 118/54 117/57 (!) 114/53 103/60  Pulse: 84 78 75 77  Resp: 22 23 25 25   Temp:      TempSrc:      SpO2: 95% 97% 96% 97%  Weight:      Height:          Constitutional: NAD, calm, obese, chronically-ill in apparence Eyes: PERTLA, lids and conjunctivae normal ENMT: Mucous membranes are moist. Posterior pharynx clear of any exudate or lesions.   Neck: normal, supple, no masses, no thyromegaly Respiratory: clear to auscultation bilaterally, no wheezing, no crackles. Normal respiratory effort.   Cardiovascular: S1 & S2 heard, regular rate and rhythm. 2+ pedal pulses. No carotid bruits.   Abdomen: No distension, tenderness throughout, soft without rebound pain or guarding, no masses palpated. Bowel sounds normal.  Musculoskeletal: no clubbing / cyanosis. No joint deformity upper and lower extremities. Normal muscle tone.  Skin: no significant rashes, lesions, ulcers. Warm, dry, well-perfused. Neurologic: CN 2-12 grossly intact. Sensation intact, DTR normal. Strength 5/5  in all 4 limbs.  Psychiatric: Calm and cooperative, able to provide "yes/no" answers to very simple questioning only.     Labs on Admission: I have personally reviewed following labs and imaging studies  CBC:  Recent Labs Lab 09/26/15 1124  WBC 18.0*  NEUTROABS 14.3*  HGB 12.0  HCT 36.4  MCV 89.7  PLT 814   Basic Metabolic Panel: No results for input(s): NA, K, CL, CO2, GLUCOSE, BUN, CREATININE, CALCIUM, MG, PHOS in the last 168 hours. GFR: Estimated Creatinine Clearance: 57.9 mL/min (by C-G formula based on SCr of 1.18 mg/dL (H)). Liver Function Tests:  Recent Labs Lab 09/26/15 1124  AST 18  ALT 19  ALKPHOS 56  BILITOT 0.6  PROT 6.2*  ALBUMIN 2.7*   No results for input(s): LIPASE, AMYLASE in the last 168 hours. No results for input(s): AMMONIA in the last 168 hours. Coagulation Profile: No results for input(s): INR, PROTIME in the last 168 hours. Cardiac Enzymes: No results for input(s): CKTOTAL, CKMB, CKMBINDEX, TROPONINI in the last 168 hours. BNP (last 3 results) No results for input(s): PROBNP in the last 8760 hours. HbA1C: No results for input(s):  HGBA1C in the last 72 hours. CBG: No results for input(s): GLUCAP in the last 168 hours. Lipid Profile: No results for input(s): CHOL, HDL, LDLCALC, TRIG, CHOLHDL, LDLDIRECT in the last 72 hours. Thyroid Function Tests: No results for input(s): TSH, T4TOTAL, FREET4, T3FREE, THYROIDAB in the last 72 hours. Anemia Panel: No results for input(s): VITAMINB12, FOLATE, FERRITIN, TIBC, IRON, RETICCTPCT in the last 72 hours. Urine analysis:    Component Value Date/Time   COLORURINE AMBER (A) 09/26/2015 1125   APPEARANCEUR HAZY (A) 09/26/2015 1125   LABSPEC 1.020 09/26/2015 1125   PHURINE 6.0 09/26/2015 1125   GLUCOSEU NEGATIVE 09/26/2015 1125   HGBUR LARGE (A) 09/26/2015 1125   BILIRUBINUR SMALL (A) 09/26/2015 1125   KETONESUR NEGATIVE 09/26/2015 1125   PROTEINUR 100 (A) 09/26/2015 1125   UROBILINOGEN 1.0  02/11/2011 2035   NITRITE POSITIVE (A) 09/26/2015 1125   LEUKOCYTESUR MODERATE (A) 09/26/2015 1125   Sepsis Labs: @LABRCNTIP (procalcitonin:4,lacticidven:4) )No results found for this or any previous visit (from the past 240 hour(s)).   Radiological Exams on Admission: Dg Chest 2 View  Result Date: 09/26/2015 CLINICAL DATA:  Shortness of breath and abdominal pain EXAM: CHEST  2 VIEW COMPARISON:  02/12/2011 FINDINGS: Chronic cardiomegaly. Stable mediastinal contours. Low volume chest. There is no edema, consolidation, effusion, or pneumothorax. No acute osseous finding. IMPRESSION: No acute finding. Electronically Signed   By: Monte Fantasia M.D.   On: 09/26/2015 11:46   Ct Head Wo Contrast  Result Date: 09/26/2015 CLINICAL DATA:  FALL TODAY FELL FORWARD, FEVER, CONFUSION, HX DM EXAM: CT HEAD WITHOUT CONTRAST CT CERVICAL SPINE WITHOUT CONTRAST TECHNIQUE: Multidetector CT imaging of the head and cervical spine was performed following the standard protocol without intravenous contrast. Multiplanar CT image reconstructions of the cervical spine were also generated. COMPARISON:  None. FINDINGS: CT HEAD FINDINGS Brain: The ventricles are normal configuration. There is ventricular sulcal enlargement reflecting mild atrophy, somewhat greater than generally seen in a patient of this age. There is no hydrocephalus. There are no parenchymal masses or mass effect. 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 There is no evidence of a recent cortical infarct. Other areas of white matter hypoattenuation noted consistent with mild chronic microvascular ischemic change. There are no extra-axial masses or abnormal fluid collections. There is no intracranial hemorrhage. Vascular: No hyperdense vessel or unexpected calcification. Skull: Normal. Negative for fracture or focal lesion. Sinuses/Orbits: Visualized orbits are unremarkable. Visualized sinuses and  mastoid air cells are clear. Other: None CT CERVICAL SPINE FINDINGS Alignment: Neck is held in flexion. Allowing for this, there is normal vertebral body alignment. No spondylolisthesis. Skull base and vertebrae: No fractures. There is an area sclerosis in the right clivus, which is nonspecific. No osteolytic lesions. Soft tissues and spinal canal: No spinal canal mass or hematoma. No soft tissue neck masses or enlarged lymph nodes. Disc levels: Mild loss of disc height at C2-C3. Moderate to marked loss of disc height from C3-C4 through T1-T2. There are endplate sclerosis and osteophytes. Mild neural foraminal narrowing noted on the right at C3-C4 from uncovertebral spurring. No other significant stenosis. Upper chest: Clear visualized upper lungs. Other: None IMPRESSION: HEAD CT: No acute intracranial abnormalities. No skull fracture. Small focus of sclerosis in the right clivus, which is nonspecific. Although likely benign, neoplastic disease is not excluded. Recommend either follow-up MRI with and without contrast versus follow-up head CT in 3-4 months to document stability. CERVICAL CT: No fracture  or acute finding. Significant degenerative changes as described. Electronically Signed   By: Lajean Manes M.D.   On: 09/26/2015 15:21   Ct Cervical Spine Wo Contrast  Result Date: 09/26/2015 CLINICAL DATA:  FALL TODAY FELL FORWARD, FEVER, CONFUSION, HX DM EXAM: CT HEAD WITHOUT CONTRAST CT CERVICAL SPINE WITHOUT CONTRAST TECHNIQUE: Multidetector CT imaging of the head and cervical spine was performed following the standard protocol without intravenous contrast. Multiplanar CT image reconstructions of the cervical spine were also generated. COMPARISON:  None. FINDINGS: CT HEAD FINDINGS Brain: The ventricles are normal configuration. There is ventricular sulcal enlargement reflecting mild atrophy, somewhat greater than generally seen in a patient of this age. There is no hydrocephalus. There are no parenchymal  masses or mass effect. 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 There is no evidence of a recent cortical infarct. Other areas of white matter hypoattenuation noted consistent with mild chronic microvascular ischemic change. There are no extra-axial masses or abnormal fluid collections. There is no intracranial hemorrhage. Vascular: No hyperdense vessel or unexpected calcification. Skull: Normal. Negative for fracture or focal lesion. Sinuses/Orbits: Visualized orbits are unremarkable. Visualized sinuses and mastoid air cells are clear. Other: None CT CERVICAL SPINE FINDINGS Alignment: Neck is held in flexion. Allowing for this, there is normal vertebral body alignment. No spondylolisthesis. Skull base and vertebrae: No fractures. There is an area sclerosis in the right clivus, which is nonspecific. No osteolytic lesions. Soft tissues and spinal canal: No spinal canal mass or hematoma. No soft tissue neck masses or enlarged lymph nodes. Disc levels: Mild loss of disc height at C2-C3. Moderate to marked loss of disc height from C3-C4 through T1-T2. There are endplate sclerosis and osteophytes. Mild neural foraminal narrowing noted on the right at C3-C4 from uncovertebral spurring. No other significant stenosis. Upper chest: Clear visualized upper lungs. Other: None IMPRESSION: HEAD CT: No acute intracranial abnormalities. No skull fracture. Small focus of sclerosis in the right clivus, which is nonspecific. Although likely benign, neoplastic disease is not excluded. Recommend either follow-up MRI with and without contrast versus follow-up head CT in 3-4 months to document stability. CERVICAL CT: No fracture or acute finding. Significant degenerative changes as described. Electronically Signed   By: Lajean Manes M.D.   On: 09/26/2015 15:21   Ct Renal Stone Study  Result Date: 09/26/2015 CLINICAL DATA:  Fall today.  Fever and confusion. EXAM: CT  ABDOMEN AND PELVIS WITHOUT CONTRAST TECHNIQUE: Multidetector CT imaging of the abdomen and pelvis was performed following the standard protocol without IV contrast. COMPARISON:  09/14/2015 FINDINGS: Lower chest:  No contributory findings. Hepatobiliary: Partial nonvisualization of the right liver. No focal liver abnormality.Cholelithiasis. No evidence of acute cholecystitis. Pancreas: Generalized fatty atrophy.  No acute finding. Spleen: Unremarkable. Adrenals/Urinary Tract: Negative adrenals. Chronic left UPJ calculus and hydronephrosis with renal cortical thinning. The UPJ stone measures up to 14 x 7 mm on coronal reformats. There is milk of calcium in the left-sided calices. Right nephrolithiasis with calculi measuring up to 8 mm. Asymmetric right perinephric stranding that is new from previous. The bladder is decompressed by Foley catheter, limiting assessment. Stomach/Bowel: No obstruction. No appendicitis. Mild colonic diverticulosis. Vascular/Lymphatic: No acute vascular abnormality. No mass or adenopathy. Reproductive:No acute finding Other: No ascites or pneumoperitoneum. Musculoskeletal: No acute abnormalities. Advanced facet arthropathy in the lower lumbar spine with grade 1 L4-5 anterolisthesis. Disc degeneration from L3-4 to L5-S1 and in the lower thoracic spine. IMPRESSION: 1.  Right perinephric edema, possible pyelonephritis in this clinical setting. No right hydronephrosis. 2. Bilateral nephrolithiasis including chronic left UPJ calculus causing left hydronephrosis. Left cortical atrophy. 3. Cholelithiasis without signs of cholecystitis. 4. History of fall with no posttraumatic finding. Electronically Signed   By: Monte Fantasia M.D.   On: 09/26/2015 15:21    EKG: Independently reviewed. Sinus tachycardia (rate 101), low-voltage QRS.   Assessment/Plan  1. Sepsis secondary to pyelonephritis   - Meets SIRS criteria on admission with urinary source - CXR clear, lactate reassuring at 1.69  -  Blood and urine cultures collected in ED and NS bolus given  - CT abdomen findings c/w a right-sided pyelonephritis  - Empiric treatment with vancomycin, Levaquin, and Azactam given in ED, will continue while awaiting culture data  - Continue IVF hydration, trend lactate, fever curve, WBC, and clinical progress  - Urology consulting and much appreciated, has recommended admission to [facility] for urgent decompression with JJ stents   2. Bilateral nephrolithiasis, chronic left UPJ obstruction   - Has been evaluated previously by urology and had been scheduled for outpatient management of the stone next month  - Given current admission with sepsis secondary to pyelo, urology has recommended a medical admission to [facility] for urgent decompression with JJ stents    3. AKI superimposed on CKD stage III - SCr 1.87 on admission, up from apparent baseline of ~1.20 - Likely secondary to sepsis (a prerenal azotemia, possibly ATN)   - Given a 1 L NS bolus in ED, continued on IVF hydration with NS at 115 cc/hr  - Repeat chem panel in am   4. Cholelithiasis  - Has been evaluated by surgery earlier this month and risk of cholecystectomy though to outweigh benefit at that time  - Has right-sided abd tenderness on presentation, but likely secondary to right-sided pyelonephritis  - No evidence for acute cholecystitis on admission CT abdomen   5. GERD - Stable, managed with daily Prilosec at home  - Treat with IV Pepcid while NPO for procedure  - Continue PPI therapy with PO Protonix once appropriate for oral intake   6. Depression, anxiety - Appears to be stable  - Continue current management with Zyprexa, Thorazine, and Buspar    7. Hypertension  - BP running on the lower side in ED, improved with IVF  - Managed at home with Aldactone, will hold for now given sepsis picture and d/t chem panel not being available on admission; resume as appropriate   8. Hypothyroidism - Appears to be stable, will  continue current dose Synthroid     DVT prophylaxis: SCD, sq heparin after procedure  Code Status: Full  Family Communication: Caregiver updated at bedside Disposition Plan: Admit to stepdown at [place] called: Urology Admission status: Inpatient    Vianne Bulls, MD [facility] Pager [phone]  If 7PM-7AM, please contact night-coverage [email or web address] Password [facility]  09/26/2015, 4:54 PM

## 2015-09-26 NOTE — Progress Notes (Signed)
ANTIBIOTIC CONSULT NOTE-Preliminary  Pharmacy Consult for Vancomycin, Levaquin, Aztreonam Indication: sepsis  Allergies  Allergen Reactions  . Macrobid [Nitrofurantoin [employer]  . Penicillins     Patient Measurements: Height: 5' (152.4 cm) Weight: 264 lb (119.7 kg) IBW/kg (Calculated) : 45.5  Vital Signs: Temp: 100.5 F (38.1 C) (09/19 1411) Temp Source: Rectal (09/19 1411) BP: 128/62 (09/19 1400) Pulse Rate: 87 (09/19 1400)  Labs: No results for input(s): WBC, HGB, PLT, LABCREA, CREATININE in the last 72 hours.  Estimated Creatinine Clearance: 57.9 mL/min (by C-G formula based on SCr of 1.18 mg/dL (H)).  No results for input(s): VANCOTROUGH, VANCOPEAK, VANCORANDOM, GENTTROUGH, GENTPEAK, GENTRANDOM, TOBRATROUGH, TOBRAPEAK, TOBRARND, AMIKACINPEAK, AMIKACINTROU, AMIKACIN in the last 72 hours.   Microbiology: No results found for this or any previous visit (from the past 720 hour(s)).  Medical History: Past Medical History:  Diagnosis Date  . Anxiety   . Diabetes mellitus   . Gall stones   . High cholesterol   . Hypertension   . Kidney stones   . Kidney stones   . MR (mental retardation)   . Thyroid disease    Anti-infectives    Start     Dose/Rate Route Frequency Ordered Stop   09/26/15 1400  vancomycin (VANCOCIN) 1,500 mg in sodium chloride 0.9 % 500 mL IVPB     1,500 mg 250 mL/hr over 120 Minutes Intravenous  Once 09/26/15 1328     09/26/15 1315  levofloxacin (LEVAQUIN) IVPB 750 mg     750 mg 100 mL/hr over 90 Minutes Intravenous  Once 09/26/15 1310     09/26/15 1315  aztreonam (AZACTAM) 2 g in dextrose 5 % 50 mL IVPB     2 g 100 mL/hr over 30 Minutes Intravenous  Once 09/26/15 1310 09/26/15 1425   09/26/15 1315  vancomycin (VANCOCIN) IVPB 1000 mg/200 mL premix  Status:  Discontinued     1,000 mg 200 mL/hr over 60 Minutes Intravenous  Once 09/26/15 1310 09/26/15 1328     Assessment: 65yo obese female.  Estimated Creatinine Clearance: 57.9  mL/min (by C-G formula based on SCr of 1.18 mg/dL (H)). Presents to ED, asked to initiate ABX for sepsis.   Goal of Therapy:  Vancomycin trough level 15-20 mcg/ml  Plan:  Preliminary review of pertinent patient information completed.  Protocol will be initiated with a one-time dose(s) of Vancomycin 1500mg , Aztreonam 2gm, and Levaquin 750mg .  Forestine Na clinical pharmacist will complete review during morning rounds to assess patient and finalize treatment regimen.  Ena Dawley, [facility] 09/26/2015,3:12 PM

## 2015-09-26 NOTE — Consult Note (Signed)
Reason for Consult: Left Ureteral, Right Renal Stones, Urosepsis  Referring Physician: Ezequiel Essex MD  Rachel Morgan is an 65 y.o. female.   HPI:   1 - Left Ureteral, Right Renal Stones - left prox ureteral 35mm stoen by CT x several 2017, tentativley planning on left ureteroscopy by Dr. Jeffie Morgan at [facility] 10/24/15. Also non-obstrucitng approx 32mm Rt renal stone. Remains present by imaging today on 09/26/15.  2 - Urosepsis - fevers, tachycardia, mental status changes with bacteruria c/w urosepsis on presentation to ER 09/2015. No lactic acidosis. UCX, BCX pending. Received empirit aztreonam, vanc, levaquin in ER. Left sided obstructing stone as per above.  Today "Rachel Morgan" is seen as urgent consult for above. Her sister "Rachel Morgan" who is HCPOA is with her.   Past Medical History:  Diagnosis Date  . Anxiety   . Diabetes mellitus   . Gall stones   . High cholesterol   . Hypertension   . Kidney stones   . Kidney stones   . MR (mental retardation)   . Thyroid disease     History reviewed. No pertinent surgical history.  History reviewed. No pertinent family history.  Social History:  reports that she has never smoked. She has never used smokeless tobacco. She reports that she does not drink alcohol or use drugs.  Allergies:  Allergies  Allergen Reactions  . Macrobid [Nitrofurantoin [employer]  . Penicillins     Medications: I have reviewed the patient's current medications.  Results for orders placed or performed during the hospital encounter of 09/26/15 (from the past 48 hour(s))  Urinalysis, Routine w reflex microscopic (not at [facility])     Status: Abnormal   Collection Time: 09/26/15 11:25 AM  Result Value Ref Range   Color, Urine AMBER (A) YELLOW    Comment: BIOCHEMICALS MAY BE AFFECTED BY COLOR   APPearance HAZY (A) CLEAR   Specific Gravity, Urine 1.020 1.005 - 1.030   pH 6.0 5.0 - 8.0   Glucose, UA NEGATIVE NEGATIVE mg/dL   Hgb urine dipstick LARGE (A)  NEGATIVE   Bilirubin Urine SMALL (A) NEGATIVE   Ketones, ur NEGATIVE NEGATIVE mg/dL   Protein, ur 100 (A) NEGATIVE mg/dL   Nitrite POSITIVE (A) NEGATIVE   Leukocytes, UA MODERATE (A) NEGATIVE  Urine microscopic-add on     Status: Abnormal   Collection Time: 09/26/15 11:25 AM  Result Value Ref Range   Squamous Epithelial / LPF 0-5 (A) NONE SEEN   WBC, UA TOO NUMEROUS TO COUNT 0 - 5 WBC/hpf   RBC / HPF TOO NUMEROUS TO COUNT 0 - 5 RBC/hpf   Bacteria, UA MANY (A) NONE SEEN  I-Stat CG4 Lactic Acid, ED  (not at  [facility])     Status: None   Collection Time: 09/26/15  1:40 PM  Result Value Ref Range   Lactic Acid, Venous 1.69 0.5 - 1.9 mmol/L    Dg Chest 2 View  Result Date: 09/26/2015 CLINICAL DATA:  Shortness of breath and abdominal pain EXAM: CHEST  2 VIEW COMPARISON:  02/12/2011 FINDINGS: Chronic cardiomegaly. Stable mediastinal contours. Low volume chest. There is no edema, consolidation, effusion, or pneumothorax. No acute osseous finding. IMPRESSION: No acute finding. Electronically Signed   By: Monte Fantasia M.D.   On: 09/26/2015 11:46   Ct Head Wo Contrast  Result Date: 09/26/2015 CLINICAL DATA:  FALL TODAY FELL FORWARD, FEVER, CONFUSION, HX DM EXAM: CT HEAD WITHOUT CONTRAST CT CERVICAL SPINE WITHOUT CONTRAST TECHNIQUE: Multidetector CT imaging of the head  and cervical spine was performed following the standard protocol without intravenous contrast. Multiplanar CT image reconstructions of the cervical spine were also generated. COMPARISON:  None. FINDINGS: CT HEAD FINDINGS Brain: The ventricles are normal configuration. There is ventricular sulcal enlargement reflecting mild atrophy, somewhat greater than generally seen in a patient of this age. There is no hydrocephalus. There are no parenchymal masses or mass effect. 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 There is no evidence of a recent cortical infarct. Other areas of  white matter hypoattenuation noted consistent with mild chronic microvascular ischemic change. There are no extra-axial masses or abnormal fluid collections. There is no intracranial hemorrhage. Vascular: No hyperdense vessel or unexpected calcification. Skull: Normal. Negative for fracture or focal lesion. Sinuses/Orbits: Visualized orbits are unremarkable. Visualized sinuses and mastoid air cells are clear. Other: None CT CERVICAL SPINE FINDINGS Alignment: Neck is held in flexion. Allowing for this, there is normal vertebral body alignment. No spondylolisthesis. Skull base and vertebrae: No fractures. There is an area sclerosis in the right clivus, which is nonspecific. No osteolytic lesions. Soft tissues and spinal canal: No spinal canal mass or hematoma. No soft tissue neck masses or enlarged lymph nodes. Disc levels: Mild loss of disc height at C2-C3. Moderate to marked loss of disc height from C3-C4 through T1-T2. There are endplate sclerosis and osteophytes. Mild neural foraminal narrowing noted on the right at C3-C4 from uncovertebral spurring. No other significant stenosis. Upper chest: Clear visualized upper lungs. Other: None IMPRESSION: HEAD CT: No acute intracranial abnormalities. No skull fracture. Small focus of sclerosis in the right clivus, which is nonspecific. Although likely benign, neoplastic disease is not excluded. Recommend either follow-up MRI with and without contrast versus follow-up head CT in 3-4 months to document stability. CERVICAL CT: No fracture or acute finding. Significant degenerative changes as described. Electronically Signed   By: Lajean Manes M.D.   On: 09/26/2015 15:21   Ct Cervical Spine Wo Contrast  Result Date: 09/26/2015 CLINICAL DATA:  FALL TODAY FELL FORWARD, FEVER, CONFUSION, HX DM EXAM: CT HEAD WITHOUT CONTRAST CT CERVICAL SPINE WITHOUT CONTRAST TECHNIQUE: Multidetector CT imaging of the head and cervical spine was performed following the standard protocol  without intravenous contrast. Multiplanar CT image reconstructions of the cervical spine were also generated. COMPARISON:  None. FINDINGS: CT HEAD FINDINGS Brain: The ventricles are normal configuration. There is ventricular sulcal enlargement reflecting mild atrophy, somewhat greater than generally seen in a patient of this age. There is no hydrocephalus. There are no parenchymal masses or mass effect. 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 There is no evidence of a recent cortical infarct. Other areas of white matter hypoattenuation noted consistent with mild chronic microvascular ischemic change. There are no extra-axial masses or abnormal fluid collections. There is no intracranial hemorrhage. Vascular: No hyperdense vessel or unexpected calcification. Skull: Normal. Negative for fracture or focal lesion. Sinuses/Orbits: Visualized orbits are unremarkable. Visualized sinuses and mastoid air cells are clear. Other: None CT CERVICAL SPINE FINDINGS Alignment: Neck is held in flexion. Allowing for this, there is normal vertebral body alignment. No spondylolisthesis. Skull base and vertebrae: No fractures. There is an area sclerosis in the right clivus, which is nonspecific. No osteolytic lesions. Soft tissues and spinal canal: No spinal canal mass or hematoma. No soft tissue neck masses or enlarged lymph nodes. Disc levels: Mild loss of disc height  at C2-C3. Moderate to marked loss of disc height from C3-C4 through T1-T2. There are endplate sclerosis and osteophytes. Mild neural foraminal narrowing noted on the right at C3-C4 from uncovertebral spurring. No other significant stenosis. Upper chest: Clear visualized upper lungs. Other: None IMPRESSION: HEAD CT: No acute intracranial abnormalities. No skull fracture. Small focus of sclerosis in the right clivus, which is nonspecific. Although likely benign, neoplastic disease is not excluded.  Recommend either follow-up MRI with and without contrast versus follow-up head CT in 3-4 months to document stability. CERVICAL CT: No fracture or acute finding. Significant degenerative changes as described. Electronically Signed   By: Lajean Manes M.D.   On: 09/26/2015 15:21   Ct Renal Stone Study  Result Date: 09/26/2015 CLINICAL DATA:  Fall today.  Fever and confusion. EXAM: CT ABDOMEN AND PELVIS WITHOUT CONTRAST TECHNIQUE: Multidetector CT imaging of the abdomen and pelvis was performed following the standard protocol without IV contrast. COMPARISON:  09/14/2015 FINDINGS: Lower chest:  No contributory findings. Hepatobiliary: Partial nonvisualization of the right liver. No focal liver abnormality.Cholelithiasis. No evidence of acute cholecystitis. Pancreas: Generalized fatty atrophy.  No acute finding. Spleen: Unremarkable. Adrenals/Urinary Tract: Negative adrenals. Chronic left UPJ calculus and hydronephrosis with renal cortical thinning. The UPJ stone measures up to 14 x 7 mm on coronal reformats. There is milk of calcium in the left-sided calices. Right nephrolithiasis with calculi measuring up to 8 mm. Asymmetric right perinephric stranding that is new from previous. The bladder is decompressed by Foley catheter, limiting assessment. Stomach/Bowel: No obstruction. No appendicitis. Mild colonic diverticulosis. Vascular/Lymphatic: No acute vascular abnormality. No mass or adenopathy. Reproductive:No acute finding Other: No ascites or pneumoperitoneum. Musculoskeletal: No acute abnormalities. Advanced facet arthropathy in the lower lumbar spine with grade 1 L4-5 anterolisthesis. Disc degeneration from L3-4 to L5-S1 and in the lower thoracic spine. IMPRESSION: 1. Right perinephric edema, possible pyelonephritis in this clinical setting. No right hydronephrosis. 2. Bilateral nephrolithiasis including chronic left UPJ calculus causing left hydronephrosis. Left cortical atrophy. 3. Cholelithiasis without  signs of cholecystitis. 4. History of fall with no posttraumatic finding. Electronically Signed   By: Monte Fantasia M.D.   On: 09/26/2015 15:21    Review of Systems  Constitutional: Positive for chills, fever and malaise/fatigue.  HENT: Negative.   Eyes: Negative.   Respiratory: Negative.   Cardiovascular: Negative.   Gastrointestinal: Negative.   Genitourinary: Positive for dysuria and flank pain.  Skin: Negative.   Neurological: Negative.   Endo/Heme/Allergies: Negative.   Psychiatric/Behavioral: Negative.    Blood pressure 117/57, pulse 78, temperature 100.5 F (38.1 C), temperature source Rectal, resp. rate 23, height 5' (1.524 m), weight 119.7 kg (264 lb), SpO2 97 %. Physical Exam  Constitutional: She appears well-developed.  Stigmata of mild mental retardation, very pleasant and cooperative. Pt's sister present.   HENT:  Head: Normocephalic.  Eyes: Pupils are equal, round, and reactive to light.  Cardiovascular: Normal rate.   Respiratory: Effort normal.  GI:  Moderate truncal obesity.   Genitourinary:  Genitourinary Comments: No CVAT at present  Musculoskeletal: Normal range of motion.  Neurological: She is alert.  Skin: Skin is warm.    Assessment/Plan:  1 - Left Ureteral, Right Renal Stones - current picture of urosepsis complicates. Rec urgent renal decompression with bilateral JJ stents. This will also allow for some passive ureteral dilation to make upcoming ureteroscopy next month safer.  Risks, benefits, alternatives (no treatment, nephrostomy), expected peri-op course discussed. Frankly discussed that this will not directly address stone, but  goal is to allow complete clearance of infection prior to stone surgery which can hopefully proceed as planned next month. Pt and POA voiced understanding and desire to proceed.   2 - Urosepsis - renal decompression for source control as per above. Agree with current ABX, expecially aztreonam, awaiting further CX data.    Taimi Towe 09/26/2015, 4:10 PM

## 2015-09-26 NOTE — Brief Op Note (Signed)
09/26/2015  9:30 PM  PATIENT:  Rachel Morgan  65 y.o. female  PRE-OPERATIVE DIAGNOSIS:  bilateral uteral stones  POST-OPERATIVE DIAGNOSIS:  bilateral uteral stones  PROCEDURE:  Procedure(s): CYSTOSCOPY WITH Bilateral  RETROGRADE PYELOGRAM/URETERAL STENT PLACEMENT (Bilateral)  SURGEON:  Surgeon(s) and Role:    * [name], MD - Primary  PHYSICIAN ASSISTANT:   ASSISTANTS: none   ANESTHESIA:   general  EBL:  No intake/output data recorded.  BLOOD ADMINISTERED:none  DRAINS: 36F temp probe foley to gravity   LOCAL MEDICATIONS USED:  NONE  SPECIMEN:  No Specimen  DISPOSITION OF SPECIMEN:  N/A  COUNTS:  YES  TOURNIQUET:  * No tourniquets in log *  DICTATION: .Other Dictation: Dictation Number [id number]  PLAN OF CARE: Admit to inpatient   PATIENT DISPOSITION:  PACU - hemodynamically stable.   Delay start of Pharmacological VTE agent (>24hrs) due to surgical blood loss or risk of bleeding: yes

## 2015-09-26 NOTE — Anesthesia Procedure Notes (Signed)
Procedure Name: Intubation Performed by: Gean Maidens Pre-anesthesia Checklist: Patient identified, Emergency Drugs available, Suction available, Patient being monitored and Timeout performed Patient Re-evaluated:Patient Re-evaluated prior to inductionOxygen Delivery Method: Circle system utilized Preoxygenation: Pre-oxygenation with 100% oxygen Intubation Type: IV induction and Rapid sequence Ventilation: Mask ventilation without difficulty Laryngoscope Size: Mac and 4 Grade View: Grade I Tube type: Oral Tube size: 7.0 mm Number of attempts: 2 Airway Equipment and Method: Stylet Placement Confirmation: ETT inserted through vocal cords under direct vision,  positive ETCO2,  CO2 detector and breath sounds checked- equal and bilateral Secured at: 22 cm Tube secured with: Tape Dental Injury: Teeth and Oropharynx as per pre-operative assessment

## 2015-09-26 NOTE — Anesthesia Preprocedure Evaluation (Signed)
Anesthesia Evaluation  Patient identified by MRN, date of birth, ID band Patient awake    Reviewed: Allergy & Precautions, NPO status , Patient's Chart, lab work & pertinent test results  Airway Mallampati: II  TM Distance: >3 FB Neck ROM: Full    Dental no notable dental hx.    Pulmonary neg pulmonary ROS,    Pulmonary exam normal breath sounds clear to auscultation       Cardiovascular hypertension, negative cardio ROS Normal cardiovascular exam Rhythm:Regular Rate:Normal     Neuro/Psych PSYCHIATRIC DISORDERS Anxiety negative neurological ROS     GI/Hepatic negative GI ROS, Neg liver ROS, GERD  ,  Endo/Other  diabetes, Type 2Hypothyroidism Morbid obesity  Renal/GU Renal disease     Musculoskeletal negative musculoskeletal ROS (+)   Abdominal   Peds  Hematology negative hematology ROS (+)   Anesthesia Other Findings   Reproductive/Obstetrics negative OB ROS                            Anesthesia Physical Anesthesia Plan  ASA: IV  Anesthesia Plan: General   Post-op Pain Management:    Induction: Intravenous, Rapid sequence and Cricoid pressure planned  Airway Management Planned: Oral ETT and Video Laryngoscope Planned  Additional Equipment:   Intra-op Plan:   Post-operative Plan: Extubation in OR  Informed Consent: I have reviewed the patients History and Physical, chart, labs and discussed the procedure including the risks, benefits and alternatives for the proposed anesthesia with the patient or authorized representative who has indicated his/her understanding and acceptance.   Dental advisory given  Plan Discussed with: CRNA  Anesthesia Plan Comments:         Anesthesia Quick Evaluation

## 2015-09-26 NOTE — ED Notes (Signed)
Called [facility] for transport to [facility] (rm [id number])  Truck is on the way.

## 2015-09-26 NOTE — Transfer of Care (Signed)
Immediate Anesthesia Transfer of Care Note  Patient: Rachel Morgan  Procedure(s) Performed: Procedure(s): CYSTOSCOPY WITH Bilateral  RETROGRADE PYELOGRAM/URETERAL STENT PLACEMENT (Bilateral)  Patient Location: PACU  Anesthesia Type:General  Level of Consciousness: sedated, patient cooperative and responds to stimulation  Airway & Oxygen Therapy: Patient Spontanous Breathing and Patient connected to face mask oxygen  Post-op Assessment: Report given to RN and Post -op Vital signs reviewed and stable  Post vital signs: Reviewed and stable  Last Vitals:  Vitals:   09/26/15 1800 09/26/15 1816  BP: 129/57 129/57  Pulse: 73 73  Resp: 16 16  Temp:  37.4 C    Last Pain:  Vitals:   09/26/15 2011  TempSrc:   PainSc: 0-No pain         Complications: No apparent anesthesia complications

## 2015-09-26 NOTE — ED Notes (Signed)
Report given to [place] at [facility].

## 2015-09-26 NOTE — ED Triage Notes (Signed)
Pt is unable to tell us but as far as EMS or relative could tell she did not hit her head. Pt is complaining of RLQ pain.

## 2015-09-26 NOTE — Anesthesia Postprocedure Evaluation (Signed)
Anesthesia Post Note  Patient: Rachel Morgan  Procedure(s) Performed: Procedure(s) (LRB): CYSTOSCOPY WITH Bilateral  RETROGRADE PYELOGRAM/URETERAL STENT PLACEMENT (Bilateral)  Patient location during evaluation: PACU Anesthesia Type: General Level of consciousness: sedated and patient cooperative Pain management: pain level controlled Vital Signs Assessment: post-procedure vital signs reviewed and stable Respiratory status: spontaneous breathing Cardiovascular status: stable Anesthetic complications: no    Last Vitals:  Vitals:   09/26/15 2200 09/26/15 2215  BP: (!) 98/51 (!) 96/50  Pulse: 86 81  Resp: (!) 26 (!) 23  Temp:      Last Pain:  Vitals:   09/26/15 2011  TempSrc:   PainSc: 0-No pain                 Nolon Nations

## 2015-09-26 NOTE — ED Triage Notes (Signed)
Pt was found down in her restroom this morning by Family. EMS picked her up and pt complained on SOB while in EMS but vitals were stable. Pt has two kidney stones in bilateral kidneys.

## 2015-09-26 NOTE — ED Notes (Signed)
Report given to Caleb with [facility]. 

## 2015-09-27 DIAGNOSIS — G809 Cerebral palsy, unspecified: Secondary | ICD-10-CM

## 2015-09-27 DIAGNOSIS — I10 Essential (primary) hypertension: Secondary | ICD-10-CM

## 2015-09-27 DIAGNOSIS — N10 Acute tubulo-interstitial nephritis: Secondary | ICD-10-CM

## 2015-09-27 DIAGNOSIS — N179 Acute kidney failure, unspecified: Secondary | ICD-10-CM

## 2015-09-27 DIAGNOSIS — E034 Atrophy of thyroid (acquired): Secondary | ICD-10-CM

## 2015-09-27 DIAGNOSIS — K8020 Calculus of gallbladder without cholecystitis without obstruction: Secondary | ICD-10-CM

## 2015-09-27 DIAGNOSIS — N183 Chronic kidney disease, stage 3 (moderate): Secondary | ICD-10-CM

## 2015-09-27 DIAGNOSIS — A419 Sepsis, unspecified organism: Principal | ICD-10-CM

## 2015-09-27 DIAGNOSIS — N200 Calculus of kidney: Secondary | ICD-10-CM

## 2015-09-27 DIAGNOSIS — E038 Other specified hypothyroidism: Secondary | ICD-10-CM

## 2015-09-27 LAB — CBC
HEMATOCRIT: 34 % — AB (ref 36.0–46.0)
Hemoglobin: 11 g/dL — ABNORMAL LOW (ref 12.0–15.0)
MCH: 29.3 pg (ref 26.0–34.0)
MCHC: 32.4 g/dL (ref 30.0–36.0)
MCV: 90.4 fL (ref 78.0–100.0)
Platelets: 281 10*3/uL (ref 150–400)
RBC: 3.76 MIL/uL — ABNORMAL LOW (ref 3.87–5.11)
RDW: 14.2 % (ref 11.5–15.5)
WBC: 14.5 10*3/uL — ABNORMAL HIGH (ref 4.0–10.5)

## 2015-09-27 LAB — BASIC METABOLIC PANEL
ANION GAP: 7 (ref 5–15)
BUN: 20 mg/dL (ref 6–20)
CALCIUM: 8 mg/dL — AB (ref 8.9–10.3)
CO2: 22 mmol/L (ref 22–32)
CREATININE: 1.36 mg/dL — AB (ref 0.44–1.00)
Chloride: 113 mmol/L — ABNORMAL HIGH (ref 101–111)
GFR calc Af Amer: 47 mL/min — ABNORMAL LOW (ref >60)
GFR, EST NON AFRICAN AMERICAN: 40 mL/min — AB (ref >60)
GLUCOSE: 102 mg/dL — AB (ref 65–99)
Potassium: 4.4 mmol/L (ref 3.5–5.1)
Sodium: 142 mmol/L (ref 135–145)

## 2015-09-27 LAB — GLUCOSE, CAPILLARY: Glucose-Capillary: 128 mg/dL — ABNORMAL HIGH (ref 65–99)

## 2015-09-27 MED ORDER — LIP MEDEX EX OINT
TOPICAL_OINTMENT | CUTANEOUS | Status: DC | PRN
Start: 2015-09-27 — End: 2015-09-29
  Administered 2015-09-27: 1 via TOPICAL
  Filled 2015-09-27: qty 7

## 2015-09-27 MED ORDER — DEXTROSE 5 % IV SOLN
2.0000 g | INTRAVENOUS | Status: DC
  Administered 2015-09-27 – 2015-09-28 (×2): 2 g via INTRAVENOUS
  Filled 2015-09-27 (×3): qty 2

## 2015-09-27 MED ORDER — SODIUM CHLORIDE 0.9 % IV SOLN: 1250.0000 mg | INTRAVENOUS | Status: DC

## 2015-09-27 NOTE — Progress Notes (Signed)
PROGRESS NOTE    BAYLIN CABAL  TIR:[id number] DOB: [birth year] DOA: 09/26/2015  PCP: [facility]   Brief Narrative:   NOORA LOCASCIO is a 65 y.o. 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 She was brought into the hospital for abdominal pain and fever after being found on the bathroom floor in her family. According to the patient's sister whom she lives with, the patient has fallen frequently since recently being discharged from [facility] on 09/17/15. She has a chronic left ureteral stone and plans were for her to undergo ureteroscopy on 10/24/15 by Dr Jeffie Pollock. In the ER she was noted to be tachycardic with a fever of 101.9 and had a UA that was positive for infection. She was admitted for sepsis secondary to UTI. CT scan revealed nephrolithiasis resulting in UPJ obstruction on the left and was suggestive of pyelonephritis on the right. She also had a nonobstructing 6 mm right renal stone. The patient was evaluated by urology and underwent double J stent placement on 9/19.  Subjective: Poor communication secondary to cerebral palsy. Unable to communicate any complaints.  Assessment & Plan:   Principal Problem: Left nephrolithiasis with UPJ obstruction, right nonobstructing nephrolithiasis -Status post bilateral double-J stent placement by Dr. Tresa Moore  Active Problems: UTI/pyelonephritis/sepsis -Fever improved to 99-WBC count improving-no longer tachycardic-did not have a lactic acidosis- - Change vancomycin, Azactam, Levaquin to Rocephin and follow-up on cultures    AKI (acute kidney injury) CKD3 - Creatinine 1.87 on admission-baseline is about 1.2-1.3 -Return to baseline today -AKI Likely secondary to obstructive uropathy and possibly  dehydration    Hypothyroidism -Continue Synthroid    Hypertension -Aldactone on hold for now    Calculus of gallbladder without cholecystitis without obstruction -Noted incidentally on CT scan  Cerebral palsy -Stable-at baseline she is able to ambulate but unable to hold a conversation-lives with sister who is her primary caretaker -Continue BuSpar, chlorpromazine, Lexapro, Zyprexa, Ambien  Note: Aspirin on hold for now  DVT prophylaxis: Heparin Code Status: Full code Family Communication:  Sister, Larcenia Holaday Disposition Plan: Home when stable Consultants:   Urology Procedures:   9/19-- CYSTOSCOPY WITH Bilateral  RETROGRADE PYELOGRAM/URETERAL STENT PLACEMENT  Antimicrobials:  Anti-infectives    Start     Dose/Rate Route Frequency Ordered Stop   09/28/15 0600  vancomycin (VANCOCIN) 1,250 mg in sodium chloride 0.9 % 250 mL IVPB     1,250 mg 166.7 mL/hr over 90 Minutes Intravenous Every 24 hours 09/27/15 1031     09/27/15 1400  levofloxacin (LEVAQUIN) IVPB 750 mg     750 mg 100 mL/hr over 90 Minutes Intravenous Every 24 hours 09/26/15 2006     09/27/15 0400  vancomycin (VANCOCIN) IVPB 1000 mg/200 mL premix  Status:  Discontinued     1,000 mg 200 mL/hr over 60 Minutes Intravenous Every 12 hours 09/26/15 2006 09/27/15 1031   09/26/15 2200  aztreonam (AZACTAM) 1 g in dextrose 5 % 50 mL IVPB     1 g 100 mL/hr over 30 Minutes Intravenous Every 8 hours 09/26/15 2006     09/26/15 1400  vancomycin (VANCOCIN) 1,500 mg in sodium chloride 0.9 % 500 mL IVPB     1,500 mg 250 mL/hr over 120 Minutes Intravenous  Once 09/26/15 1328 09/26/15 1740   09/26/15 1315  levofloxacin (LEVAQUIN) IVPB 750  mg     750 mg 100 mL/hr over 90 Minutes Intravenous  Once 09/26/15 1310 09/26/15 1532   09/26/15 1315  aztreonam (AZACTAM) 2 g in dextrose 5 % 50 mL IVPB     2 g 100 mL/hr over 30 Minutes Intravenous  Once 09/26/15 1310 09/26/15 1425   09/26/15 1315  vancomycin (VANCOCIN) IVPB 1000 mg/200  mL premix  Status:  Discontinued     1,000 mg 200 mL/hr over 60 Minutes Intravenous  Once 09/26/15 1310 09/26/15 1328       Objective: Vitals:   09/27/15 0400 09/27/15 0415 09/27/15 0600 09/27/15 0800  BP: (!) 114/38  (!) 110/39   Pulse: 71  69   Resp: (!) 22  (!) 24   Temp:  99.5 F (37.5 C)  99.2 F (37.3 C)  TempSrc:  Oral  Oral  SpO2: 96%  94%   Weight:      Height:        Intake/Output Summary (Last 24 hours) at 09/27/15 1156 Last data filed at 09/27/15 0913  Gross per 24 hour  Intake          3484.75 ml  Output              720 ml  Net          2764.75 ml   Filed Weights   09/26/15 1121  Weight: 119.7 kg (264 lb)    Examination: General exam: Appears comfortable  HEENT: PERRLA, oral mucosa moist, no sclera icterus or thrush Respiratory system: Clear to auscultation. Respiratory effort normal. Cardiovascular system: S1 & S2 heard, RRR.  No murmurs  Gastrointestinal system: Abdomen soft, non-tender, nondistended. Normal bowel sound. No organomegaly Central nervous system: Alert and oriented. No focal neurological deficits. Extremities: No cyanosis, clubbing or edema Skin: No rashes or ulcers  Data Reviewed: I have personally reviewed following labs and imaging studies  CBC:  Recent Labs Lab 09/26/15 1124 09/27/15 0755  WBC 18.0* 14.5*  NEUTROABS 14.3*  --   HGB 12.0 11.0*  HCT 36.4 34.0*  MCV 89.7 90.4  PLT 267 500   Basic Metabolic Panel:  Recent Labs Lab 09/26/15 1124 09/27/15 0755  NA 140 142  K 3.9 4.4  CL 108 113*  CO2 20* 22  GLUCOSE 146* 102*  BUN 28* 20  CREATININE 1.87* 1.36*  CALCIUM 8.8* 8.0*   GFR: Estimated Creatinine Clearance: 50.3 mL/min (by C-G formula based on SCr of 1.36 mg/dL (H)). Liver Function Tests:  Recent Labs Lab 09/26/15 1124  AST 18  ALT 19  ALKPHOS 56  BILITOT 0.6  PROT 6.2*  ALBUMIN 2.7*    Recent Labs Lab 09/26/15 1124  LIPASE 11   No results for input(s): AMMONIA in the last 168  hours. Coagulation Profile:  Recent Labs Lab 09/26/15 1711  INR 1.30   Cardiac Enzymes:  Recent Labs Lab 09/26/15 1711  TROPONINI <0.03   BNP (last 3 results) No results for input(s): PROBNP in the last 8760 hours. HbA1C: No results for input(s): HGBA1C in the last 72 hours. CBG:  Recent Labs Lab 09/26/15 1819 09/26/15 2204 09/27/15 0827  GLUCAP 83 126* 128*   Lipid Profile: No results for input(s): CHOL, HDL, LDLCALC, TRIG, CHOLHDL, LDLDIRECT in the last 72 hours. Thyroid Function Tests: No results for input(s): TSH, T4TOTAL, FREET4, T3FREE, THYROIDAB in the last 72 hours. Anemia Panel: No results for input(s): VITAMINB12, FOLATE, FERRITIN, TIBC, IRON, RETICCTPCT in the last 72 hours. Urine analysis:  Component Value Date/Time   COLORURINE AMBER (A) 09/26/2015 1125   APPEARANCEUR HAZY (A) 09/26/2015 1125   LABSPEC 1.020 09/26/2015 1125   PHURINE 6.0 09/26/2015 1125   GLUCOSEU NEGATIVE 09/26/2015 1125   HGBUR LARGE (A) 09/26/2015 1125   BILIRUBINUR SMALL (A) 09/26/2015 1125   KETONESUR NEGATIVE 09/26/2015 1125   PROTEINUR 100 (A) 09/26/2015 1125   UROBILINOGEN 1.0 02/11/2011 2035   NITRITE POSITIVE (A) 09/26/2015 1125   LEUKOCYTESUR MODERATE (A) 09/26/2015 1125   Sepsis Labs: @LABRCNTIP (procalcitonin:4,lacticidven:4) ) Recent Results (from the past 240 hour(s))  Blood Culture (routine x 2)     Status: None (Preliminary result)   Collection Time: 09/26/15  1:40 PM  Result Value Ref Range Status   Specimen Description BLOOD LEFT FOREARM  Final   Special Requests BOTTLES DRAWN AEROBIC AND ANAEROBIC 6CC EACH  Final   Culture NO GROWTH < 24 HOURS  Final   Report Status PENDING  Incomplete  Blood Culture (routine x 2)     Status: None (Preliminary result)   Collection Time: 09/26/15  1:47 PM  Result Value Ref Range Status   Specimen Description BLOOD RIGHT HAND  Final   Special Requests BOTTLES DRAWN AEROBIC AND ANAEROBIC 6CC EACH  Final   Culture NO  GROWTH < 24 HOURS  Final   Report Status PENDING  Incomplete  Surgical pcr screen     Status: Abnormal   Collection Time: 09/26/15  8:07 PM  Result Value Ref Range Status   MRSA, PCR NEGATIVE NEGATIVE Final   Staphylococcus aureus POSITIVE (A) NEGATIVE Final    Comment:        The Xpert SA Assay (FDA approved for NASAL specimens in patients over 66 years of age), is one component of a comprehensive surveillance program.  Test performance has been validated by [facility] for patients greater than or equal to 96 year old. It is not intended to diagnose infection nor to guide or monitor treatment.          Radiology Studies: Dg Chest 2 View  Result Date: 09/26/2015 CLINICAL DATA:  Shortness of breath and abdominal pain EXAM: CHEST  2 VIEW COMPARISON:  02/12/2011 FINDINGS: Chronic cardiomegaly. Stable mediastinal contours. Low volume chest. There is no edema, consolidation, effusion, or pneumothorax. No acute osseous finding. IMPRESSION: No acute finding. Electronically Signed   By: Monte Fantasia M.D.   On: 09/26/2015 11:46   Ct Head Wo Contrast  Result Date: 09/26/2015 CLINICAL DATA:  FALL TODAY FELL FORWARD, FEVER, CONFUSION, HX DM EXAM: CT HEAD WITHOUT CONTRAST CT CERVICAL SPINE WITHOUT CONTRAST TECHNIQUE: Multidetector CT imaging of the head and cervical spine was performed following the standard protocol without intravenous contrast. Multiplanar CT image reconstructions of the cervical spine were also generated. COMPARISON:  None. FINDINGS: CT HEAD FINDINGS Brain: The ventricles are normal configuration. There is ventricular sulcal enlargement reflecting mild atrophy, somewhat greater than generally seen in a patient of this age. There is no hydrocephalus. There are no parenchymal masses or mass effect. 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 There is no evidence of a recent cortical infarct. Other areas of white  matter hypoattenuation noted consistent with mild chronic microvascular ischemic change. There are no extra-axial masses or abnormal fluid collections. There is no intracranial hemorrhage. Vascular: No hyperdense vessel or unexpected calcification. Skull: Normal. Negative for fracture or focal lesion. Sinuses/Orbits: Visualized orbits are unremarkable. Visualized sinuses and mastoid air cells are clear.  Other: None CT CERVICAL SPINE FINDINGS Alignment: Neck is held in flexion. Allowing for this, there is normal vertebral body alignment. No spondylolisthesis. Skull base and vertebrae: No fractures. There is an area sclerosis in the right clivus, which is nonspecific. No osteolytic lesions. Soft tissues and spinal canal: No spinal canal mass or hematoma. No soft tissue neck masses or enlarged lymph nodes. Disc levels: Mild loss of disc height at C2-C3. Moderate to marked loss of disc height from C3-C4 through T1-T2. There are endplate sclerosis and osteophytes. Mild neural foraminal narrowing noted on the right at C3-C4 from uncovertebral spurring. No other significant stenosis. Upper chest: Clear visualized upper lungs. Other: None IMPRESSION: HEAD CT: No acute intracranial abnormalities. No skull fracture. Small focus of sclerosis in the right clivus, which is nonspecific. Although likely benign, neoplastic disease is not excluded. Recommend either follow-up MRI with and without contrast versus follow-up head CT in 3-4 months to document stability. CERVICAL CT: No fracture or acute finding. Significant degenerative changes as described. Electronically Signed   By: Lajean Manes M.D.   On: 09/26/2015 15:21   Ct Cervical Spine Wo Contrast  Result Date: 09/26/2015 CLINICAL DATA:  FALL TODAY FELL FORWARD, FEVER, CONFUSION, HX DM EXAM: CT HEAD WITHOUT CONTRAST CT CERVICAL SPINE WITHOUT CONTRAST TECHNIQUE: Multidetector CT imaging of the head and cervical spine was performed following the standard protocol without  intravenous contrast. Multiplanar CT image reconstructions of the cervical spine were also generated. COMPARISON:  None. FINDINGS: CT HEAD FINDINGS Brain: The ventricles are normal configuration. There is ventricular sulcal enlargement reflecting mild atrophy, somewhat greater than generally seen in a patient of this age. There is no hydrocephalus. There are no parenchymal masses or mass effect. 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 There is no evidence of a recent cortical infarct. Other areas of white matter hypoattenuation noted consistent with mild chronic microvascular ischemic change. There are no extra-axial masses or abnormal fluid collections. There is no intracranial hemorrhage. Vascular: No hyperdense vessel or unexpected calcification. Skull: Normal. Negative for fracture or focal lesion. Sinuses/Orbits: Visualized orbits are unremarkable. Visualized sinuses and mastoid air cells are clear. Other: None CT CERVICAL SPINE FINDINGS Alignment: Neck is held in flexion. Allowing for this, there is normal vertebral body alignment. No spondylolisthesis. Skull base and vertebrae: No fractures. There is an area sclerosis in the right clivus, which is nonspecific. No osteolytic lesions. Soft tissues and spinal canal: No spinal canal mass or hematoma. No soft tissue neck masses or enlarged lymph nodes. Disc levels: Mild loss of disc height at C2-C3. Moderate to marked loss of disc height from C3-C4 through T1-T2. There are endplate sclerosis and osteophytes. Mild neural foraminal narrowing noted on the right at C3-C4 from uncovertebral spurring. No other significant stenosis. Upper chest: Clear visualized upper lungs. Other: None IMPRESSION: HEAD CT: No acute intracranial abnormalities. No skull fracture. Small focus of sclerosis in the right clivus, which is nonspecific. Although likely benign, neoplastic disease is not excluded. Recommend either  follow-up MRI with and without contrast versus follow-up head CT in 3-4 months to document stability. CERVICAL CT: No fracture or acute finding. Significant degenerative changes as described. Electronically Signed   By: Lajean Manes M.D.   On: 09/26/2015 15:21   Ct Renal Stone Study  Result Date: 09/26/2015 CLINICAL DATA:  Fall today.  Fever and confusion. EXAM: CT ABDOMEN AND PELVIS WITHOUT CONTRAST TECHNIQUE: Multidetector CT imaging of the abdomen  and pelvis was performed following the standard protocol without IV contrast. COMPARISON:  09/14/2015 FINDINGS: Lower chest:  No contributory findings. Hepatobiliary: Partial nonvisualization of the right liver. No focal liver abnormality.Cholelithiasis. No evidence of acute cholecystitis. Pancreas: Generalized fatty atrophy.  No acute finding. Spleen: Unremarkable. Adrenals/Urinary Tract: Negative adrenals. Chronic left UPJ calculus and hydronephrosis with renal cortical thinning. The UPJ stone measures up to 14 x 7 mm on coronal reformats. There is milk of calcium in the left-sided calices. Right nephrolithiasis with calculi measuring up to 8 mm. Asymmetric right perinephric stranding that is new from previous. The bladder is decompressed by Foley catheter, limiting assessment. Stomach/Bowel: No obstruction. No appendicitis. Mild colonic diverticulosis. Vascular/Lymphatic: No acute vascular abnormality. No mass or adenopathy. Reproductive:No acute finding Other: No ascites or pneumoperitoneum. Musculoskeletal: No acute abnormalities. Advanced facet arthropathy in the lower lumbar spine with grade 1 L4-5 anterolisthesis. Disc degeneration from L3-4 to L5-S1 and in the lower thoracic spine. IMPRESSION: 1. Right perinephric edema, possible pyelonephritis in this clinical setting. No right hydronephrosis. 2. Bilateral nephrolithiasis including chronic left UPJ calculus causing left hydronephrosis. Left cortical atrophy. 3. Cholelithiasis without signs of  cholecystitis. 4. History of fall with no posttraumatic finding. Electronically Signed   By: Monte Fantasia M.D.   On: 09/26/2015 15:21      Scheduled Meds: . ALPRAZolam  1 mg Oral BID  . aztreonam  1 g Intravenous Q8H  . busPIRone  10 mg Oral q morning - 10a  . chlorproMAZINE  25 mg Oral BID  . escitalopram  20 mg Oral QHS  . famotidine (PEPCID) IV  20 mg Intravenous Q12H  . heparin  5,000 Units Subcutaneous Q8H  . levofloxacin (LEVAQUIN) IV  750 mg Intravenous Q24H  . levothyroxine  50 mcg Oral QAC breakfast  . mouth rinse  15 mL Mouth Rinse BID  . OLANZapine  20 mg Oral Daily  . oxybutynin  10 mg Oral q morning - 10a  . pantoprazole  40 mg Oral Daily  . sodium chloride flush  3 mL Intravenous Q12H  . [START ON 09/28/2015] vancomycin  1,250 mg Intravenous Q24H  . zolpidem  5 mg Oral QHS   Continuous Infusions:    LOS: 1 day    Time spent in minutes: 70    [place], MD [facility] Pager: [email or web address] Password [email or web address] 09/27/2015, 11:56 AM

## 2015-09-27 NOTE — Op Note (Signed)
Rachel Morgan, Rachel Morgan              ACCOUNT NO.:  [id number]  MEDICAL RECORD NO.:  [id number]  LOCATION:  44                         FACILITY:  [facility]  PHYSICIAN:  [name], MD     DATE OF BIRTH:  03-22-1950  DATE OF PROCEDURE: 09/26/2015                              OPERATIVE REPORT   DIAGNOSES: 1. Left ureteral right renal stone. 2. Left hydronephrosis and urosepsis.  PROCEDURE: 1. Cystoscopy with bilateral retrograde pyelogram and interpretation. 2. Insertion of bilateral ureteral stents, 6 x 24, Contour, no tether.  ESTIMATED BLOOD LOSS:  Nil.  COMPLICATIONS:  None.  SPECIMENS:  None.  FINDINGS: 1. Moderate hydronephrosis on the left without ureteronephrosis and     filling defect in the proximal ureter consistent with known stone. 2. Successful placement of left ureteral stent, proximal in upper     pole, distal in urinary bladder. 3. Successful placement of right ureteral stent, proximal in upper     pole, distal in urinary bladder.  INDICATION:  Rachel Morgan is a 65 year old lady with history of mental handicap and morbid obesity, who has been known to have a large left proximal ureteral stone for some time.  She had been tentatively scheduled to have left ureteroscopic stone manipulation next month at [facility].  She unfortunately presented today to that facility with mental status changes, fevers, tachycardia, bacteriuria consistent with urosepsis, and left hydronephrosis from obstructing stone.  It was felt that urgent renal decompression was warranted and subsequent ICU admission.  The patient was stabilized and transferred to [facility] for ICU admission and ureteral stenting.  I reviewed the patient's records and imaging and it was felt that bilateral stenting would be most prudent to address her left obstruction and to ensure no right-sided obstruction given her known right-sided nephrolithiasis. Informed consent was obtained and placed in the  medical record. Notably, the patient's power of attorney, [name] has also given the consent.  PROCEDURE IN DETAIL:  The patient being Rachel Morgan verified. Procedure being cysto with bilateral retrogrades, bilateral stent placement was confirmed.  Procedure was carried out.  Time-out was performed.  Intravenous antibiotics administered previously and verified current.  The patient was placed into a low lithotomy position.  Sterile field was created by prepping and draping the patient's vagina, introitus, and proximal thighs using iodine x3.  Next, cystourethroscopy was performed using a 21-French rigid cystoscope with offset lens. Inspection of the urinary bladder revealed significant cloudy urine and diffuse erythema consistent with an impressive cystitis and infection. The left ureteral orifice was cannulated with 6-French end-hole catheter and very gentle left retrograde pyelogram was obtained.  Left retrograde pyelogram demonstrated single left ureter with single- system left kidney.  There was moderate hydronephrosis with filling defect in the proximal ureter consistent with known stone.  A 0.038 zip wire was advanced to the level of the upper pole, over which, a new 6 x 24 Contour-type stent was placed using cystoscopic and fluoroscopic guidance.  Good proximal and distal deployment were noted.  Efflux of copious purulent and thick-appearing urine was seen around into the distal end of the stent.  Similarly, right retrograde pyelogram was obtained.  Right retrograde pyelogram demonstrated a  single right ureter with single-system right kidney.  No filling defects or narrowing noted.  A 0.038 zip wire was advanced to the level of the upper pole, over which, a new 6 x 24 Contour-type stent was placed.  Good proximal and distal deployment were noted.  A temperature probe Foley was then placed per urethra to straight drain and procedure was terminated.  The patient tolerated the  procedure well with no immediate periprocedural complications.  The patient was taken to the postanesthesia care unit in a stable condition with plan for ICU admission.          ______________________________ [name], MD     TM/MEDQ  D:  09/26/2015  T:  09/27/2015  Job:  [id number]

## 2015-09-27 NOTE — Care Management Note (Signed)
Case Management Note  Patient Details  Name: KHALESSI BLOUGH MRN: [id number] Date of Birth: 1950/11/28  Subjective/Objective:         Urosepsis with renal stentplacements           Action/Plan: snf   Expected Discharge Date:   (unknown)               Expected Discharge Plan:  Skilled Nursing Facility  In-House Referral:  Clinical Social Work  Discharge planning Services     Post Acute Care Choice:    Choice offered to:     DME Arranged:    DME Agency:     HH Arranged:    [place] Agency:     Status of Service:  In process, will continue to follow  If discussed at Long Length of Stay Meetings, dates discussed:    Additional Comments:Date:  September 27, 2015 Chart reviewed for concurrent status and case management needs. Will continue to follow the patient for status change: Discharge Planning: following for needs Expected discharge date: [id number] Velva Harman, BSN, [place], [place]  Leeroy Cha, RN 09/27/2015, 8:44 AM

## 2015-09-27 NOTE — Progress Notes (Signed)
Pharmacy Antibiotic Note  Rachel Morgan is a 65 y.o. female admitted on 09/26/2015 with sepsis due to UTI.  Pharmacy has been consulted for vancomycin/aztreonam/levofloxacin dosing. Patient was given and appears to have tolerated ceftriaxone in '13 (PCN allergy listed with no rxn documented)  Plan:  Based on obesity dosing algorithm, change vancomycin to 1250mg  IV q24h  Continue aztreonam 1gm IV q8h  Continue levofloxacin 750mg  IV q24h  Consider change aztreonam/levofloxacin to cefepime based on previous s-sporin exposure  Monitor renal fx and check trough if remains on vancomycin  Follow culture reuslts  Height: 5' (152.4 cm) Weight: 264 lb (119.7 kg) IBW/kg (Calculated) : 45.5  Temp (24hrs), Avg:99.7 F (37.6 C), Min:98.6 F (37 C), Max:101.9 F (38.8 C)   Recent Labs Lab 09/26/15 1124 09/26/15 1132 09/26/15 1340 09/26/15 1711 09/27/15 0755  WBC 18.0*  --   --   --  14.5*  CREATININE 1.87*  --   --   --  1.36*  LATICACIDVEN  --  1.2 1.69 1.0  --     Estimated Creatinine Clearance: 50.3 mL/min (by C-G formula based on SCr of 1.36 mg/dL (H)).    Allergies  Allergen Reactions  . Macrobid [Nitrofurantoin [employer]  . Penicillins     Antimicrobials this admission:  Vanc 9/19 >>  Aztreonam 9/19 >>  Levaquin 9/19 >>  Dose adjustments this admission:  9/20: change vanco to 1250mg  IV q24h per obesity vanco dosing  Microbiology results:  9/19 BCx: pend 9/19 UCx: pend 9/19 MRSA PCR: neg (+SA)  Thank you for allowing pharmacy to be a part of this patient's care.  Doreene Eland, PharmD, BCPS.   Pager: [phone] 09/27/2015 10:35 AM

## 2015-09-28 ENCOUNTER — Encounter (HOSPITAL_COMMUNITY): Payer: Self-pay | Admitting: Urology

## 2015-09-28 DIAGNOSIS — N135 Crossing vessel and stricture of ureter without hydronephrosis: Secondary | ICD-10-CM

## 2015-09-28 LAB — CBC
HEMATOCRIT: 32.3 % — AB (ref 36.0–46.0)
HEMOGLOBIN: 10.6 g/dL — AB (ref 12.0–15.0)
MCH: 29.6 pg (ref 26.0–34.0)
MCHC: 32.8 g/dL (ref 30.0–36.0)
MCV: 90.2 fL (ref 78.0–100.0)
Platelets: 258 10*3/uL (ref 150–400)
RBC: 3.58 MIL/uL — ABNORMAL LOW (ref 3.87–5.11)
RDW: 13.8 % (ref 11.5–15.5)
WBC: 9.8 10*3/uL (ref 4.0–10.5)

## 2015-09-28 LAB — BASIC METABOLIC PANEL
ANION GAP: 6 (ref 5–15)
BUN: 15 mg/dL (ref 6–20)
CHLORIDE: 113 mmol/L — AB (ref 101–111)
CO2: 23 mmol/L (ref 22–32)
Calcium: 8.1 mg/dL — ABNORMAL LOW (ref 8.9–10.3)
Creatinine, Ser: 1.35 mg/dL — ABNORMAL HIGH (ref 0.44–1.00)
GFR calc Af Amer: 47 mL/min — ABNORMAL LOW (ref >60)
GFR, EST NON AFRICAN AMERICAN: 41 mL/min — AB (ref >60)
GLUCOSE: 103 mg/dL — AB (ref 65–99)
POTASSIUM: 3.8 mmol/L (ref 3.5–5.1)
Sodium: 142 mmol/L (ref 135–145)

## 2015-09-28 LAB — GLUCOSE, CAPILLARY: Glucose-Capillary: 104 mg/dL — ABNORMAL HIGH (ref 65–99)

## 2015-09-28 MED ORDER — ASPIRIN EC 81 MG PO TBEC
81.0000 mg | DELAYED_RELEASE_TABLET | Freq: Every morning | ORAL | Status: DC
  Administered 2015-09-28 – 2015-09-29 (×2): 81 mg via ORAL
  Filled 2015-09-28 (×2): qty 1

## 2015-09-28 NOTE — Progress Notes (Signed)
PROGRESS NOTE    Rachel Morgan  HBZ:[id number] DOB: 06/01/1950 DOA: 09/26/2015  PCP: [facility]   Brief Narrative:   Rachel Morgan is a 65 y.o. 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 She was brought into the hospital for abdominal pain and fever after being found on the bathroom floor in her family. According to the patient's sister whom she lives with, the patient has fallen frequently since recently being discharged from [place] on 09/17/15. She has a chronic left ureteral stone and plans were for her to undergo ureteroscopy on 10/24/15 by Dr Jeffie Pollock. In the ER she was noted to be tachycardic with a fever of 101.9 and had a UA that was positive for infection. She was admitted for sepsis secondary to UTI. CT scan revealed nephrolithiasis resulting in UPJ obstruction on the left and was suggestive of pyelonephritis on the right. She also had a nonobstructing 6 mm right renal stone. The patient was evaluated by urology and underwent double J stent placement on 9/19.  Subjective: Poor communication secondary to cerebral palsy. Unable to communicate any complaints.  Assessment & Plan:   Principal Problem: Left nephrolithiasis with UPJ obstruction, right nonobstructing nephrolithiasis -Status post bilateral double-J stent placement by Dr. Tresa Moore  Active Problems: UTI/pyelonephritis/sepsis -Fever improved to 99-WBC count improving-no longer tachycardic-did not have a lactic acidosis- - Change vancomycin, Azactam, Levaquin to Rocephin and follow-up on cultures - U culture growing Gr neg rods- follow    AKI (acute kidney injury) CKD3 - Creatinine 1.87 on admission-baseline is about 1.2-1.3 -Returned to baseline  -AKI Likely secondary to  obstructive uropathy and possibly dehydration    Hypothyroidism -Continue Synthroid    Hypertension -Aldactone on hold for now    Calculus of gallbladder without cholecystitis without obstruction -Noted incidentally on CT scan  Cerebral palsy -Stable-at baseline she is able to ambulate but unable to hold a conversation-lives with sister who is her primary caretaker -Continue BuSpar, chlorpromazine, Lexapro, Zyprexa, Ambien  DVT prophylaxis: Heparin Code Status: Full code Family Communication:  Sister, Yoanna Jurczyk Disposition Plan: Home when stable Consultants:   Urology Procedures:   9/19-- CYSTOSCOPY WITH Bilateral  RETROGRADE PYELOGRAM/URETERAL STENT PLACEMENT  Antimicrobials:  Anti-infectives    Start     Dose/Rate Route Frequency Ordered Stop   09/28/15 0600  vancomycin (VANCOCIN) 1,250 mg in sodium chloride 0.9 % 250 mL IVPB  Status:  Discontinued     1,250 mg 166.7 mL/hr over 90 Minutes Intravenous Every 24 hours 09/27/15 1031 09/27/15 1206   09/27/15 1400  levofloxacin (LEVAQUIN) IVPB 750 mg  Status:  Discontinued     750 mg 100 mL/hr over 90 Minutes Intravenous Every 24 hours 09/26/15 2006 09/27/15 1207   09/27/15 1400  cefTRIAXone (ROCEPHIN) 2 g in dextrose 5 % 50 mL IVPB     2 g 100 mL/hr over 30 Minutes Intravenous Every 24 hours 09/27/15 1207     09/27/15 0400  vancomycin (VANCOCIN) IVPB 1000 mg/200 mL premix  Status:  Discontinued     1,000 mg 200 mL/hr over 60 Minutes Intravenous Every 12 hours 09/26/15 2006 09/27/15 1031   09/26/15 2200  aztreonam (AZACTAM) 1 g in dextrose 5 % 50 mL IVPB  Status:  Discontinued     1 g 100 mL/hr over 30 Minutes Intravenous Every 8 hours  09/26/15 2006 09/27/15 1207   09/26/15 1400  vancomycin (VANCOCIN) 1,500 mg in sodium chloride 0.9 % 500 mL IVPB     1,500 mg 250 mL/hr over 120 Minutes Intravenous  Once 09/26/15 1328 09/26/15 1740   09/26/15 1315  levofloxacin (LEVAQUIN) IVPB 750 mg     750 mg 100 mL/hr over 90  Minutes Intravenous  Once 09/26/15 1310 09/26/15 1532   09/26/15 1315  aztreonam (AZACTAM) 2 g in dextrose 5 % 50 mL IVPB     2 g 100 mL/hr over 30 Minutes Intravenous  Once 09/26/15 1310 09/26/15 1425   09/26/15 1315  vancomycin (VANCOCIN) IVPB 1000 mg/200 mL premix  Status:  Discontinued     1,000 mg 200 mL/hr over 60 Minutes Intravenous  Once 09/26/15 1310 09/26/15 1328       Objective: Vitals:   09/28/15 0500 09/28/15 0600 09/28/15 0800 09/28/15 1000  BP:  (!) 134/55 (!) 118/51 (!) 123/55  Pulse:  67 66   Resp:  17 (!) 22 (!) 23  Temp:   98.3 F (36.8 C)   TempSrc:   Oral   SpO2:  95% 96%   Weight: 121.4 kg (267 lb 10.2 oz)     Height:        Intake/Output Summary (Last 24 hours) at 09/28/15 1204 Last data filed at 09/28/15 0800  Gross per 24 hour  Intake              650 ml  Output             2600 ml  Net            -1950 ml   Filed Weights   09/26/15 1121 09/28/15 0500  Weight: 119.7 kg (264 lb) 121.4 kg (267 lb 10.2 oz)    Examination: General exam: Appears comfortable  HEENT: PERRLA, oral mucosa moist, no sclera icterus or thrush Respiratory system: Clear to auscultation. Respiratory effort normal. Cardiovascular system: S1 & S2 heard, RRR.  No murmurs  Gastrointestinal system: Abdomen soft, non-tender, nondistended. Normal bowel sound. No organomegaly Central nervous system: Alert and oriented. No focal neurological deficits. Extremities: No cyanosis, clubbing or edema Skin: No rashes or ulcers  Data Reviewed: I have personally reviewed following labs and imaging studies  CBC:  Recent Labs Lab 09/26/15 1124 09/27/15 0755 09/28/15 0346  WBC 18.0* 14.5* 9.8  NEUTROABS 14.3*  --   --   HGB 12.0 11.0* 10.6*  HCT 36.4 34.0* 32.3*  MCV 89.7 90.4 90.2  PLT 267 281 992   Basic Metabolic Panel:  Recent Labs Lab 09/26/15 1124 09/27/15 0755 09/28/15 0346  NA 140 142 142  K 3.9 4.4 3.8  CL 108 113* 113*  CO2 20* 22 23  GLUCOSE 146* 102* 103*    BUN 28* 20 15  CREATININE 1.87* 1.36* 1.35*  CALCIUM 8.8* 8.0* 8.1*   GFR: Estimated Creatinine Clearance: 51.1 mL/min (by C-G formula based on SCr of 1.35 mg/dL (H)). Liver Function Tests:  Recent Labs Lab 09/26/15 1124  AST 18  ALT 19  ALKPHOS 56  BILITOT 0.6  PROT 6.2*  ALBUMIN 2.7*    Recent Labs Lab 09/26/15 1124  LIPASE 11   No results for input(s): AMMONIA in the last 168 hours. Coagulation Profile:  Recent Labs Lab 09/26/15 1711  INR 1.30   Cardiac Enzymes:  Recent Labs Lab 09/26/15 1711  TROPONINI <0.03   BNP (last 3 results) No results for input(s): PROBNP in the last 8760 hours.  HbA1C: No results for input(s): HGBA1C in the last 72 hours. CBG:  Recent Labs Lab 09/26/15 1819 09/26/15 2204 09/27/15 0827 09/28/15 0807  GLUCAP 83 126* 128* 104*   Lipid Profile: No results for input(s): CHOL, HDL, LDLCALC, TRIG, CHOLHDL, LDLDIRECT in the last 72 hours. Thyroid Function Tests: No results for input(s): TSH, T4TOTAL, FREET4, T3FREE, THYROIDAB in the last 72 hours. Anemia Panel: No results for input(s): VITAMINB12, FOLATE, FERRITIN, TIBC, IRON, RETICCTPCT in the last 72 hours. Urine analysis:    Component Value Date/Time   COLORURINE AMBER (A) 09/26/2015 1125   APPEARANCEUR HAZY (A) 09/26/2015 1125   LABSPEC 1.020 09/26/2015 1125   PHURINE 6.0 09/26/2015 1125   GLUCOSEU NEGATIVE 09/26/2015 1125   HGBUR LARGE (A) 09/26/2015 1125   BILIRUBINUR SMALL (A) 09/26/2015 1125   KETONESUR NEGATIVE 09/26/2015 1125   PROTEINUR 100 (A) 09/26/2015 1125   UROBILINOGEN 1.0 02/11/2011 2035   NITRITE POSITIVE (A) 09/26/2015 1125   LEUKOCYTESUR MODERATE (A) 09/26/2015 1125   Sepsis Labs: @LABRCNTIP (procalcitonin:4,lacticidven:4) ) Recent Results (from the past 240 hour(s))  Urine culture     Status: Abnormal (Preliminary result)   Collection Time: 09/26/15 11:31 AM  Result Value Ref Range Status   Specimen Description URINE, RANDOM  Final   Special  Requests NONE  Final   Culture >=100,000 COLONIES/mL GRAM NEGATIVE RODS (A)  Final   Report Status PENDING  Incomplete  Blood Culture (routine x 2)     Status: None (Preliminary result)   Collection Time: 09/26/15  1:40 PM  Result Value Ref Range Status   Specimen Description BLOOD LEFT FOREARM  Final   Special Requests BOTTLES DRAWN AEROBIC AND ANAEROBIC 6CC EACH  Final   Culture NO GROWTH 2 DAYS  Final   Report Status PENDING  Incomplete  Blood Culture (routine x 2)     Status: None (Preliminary result)   Collection Time: 09/26/15  1:47 PM  Result Value Ref Range Status   Specimen Description BLOOD RIGHT HAND  Final   Special Requests BOTTLES DRAWN AEROBIC AND ANAEROBIC 6CC EACH  Final   Culture NO GROWTH 2 DAYS  Final   Report Status PENDING  Incomplete  Surgical pcr screen     Status: Abnormal   Collection Time: 09/26/15  8:07 PM  Result Value Ref Range Status   MRSA, PCR NEGATIVE NEGATIVE Final   Staphylococcus aureus POSITIVE (A) NEGATIVE Final    Comment:        The Xpert SA Assay (FDA approved for NASAL specimens in patients over 72 years of age), is one component of a comprehensive surveillance program.  Test performance has been validated by [facility] for patients greater than or equal to 54 year old. It is not intended to diagnose infection nor to guide or monitor treatment.          Radiology Studies: Ct Head Wo Contrast  Result Date: 09/26/2015 CLINICAL DATA:  FALL TODAY FELL FORWARD, FEVER, CONFUSION, HX DM EXAM: CT HEAD WITHOUT CONTRAST CT CERVICAL SPINE WITHOUT CONTRAST TECHNIQUE: Multidetector CT imaging of the head and cervical spine was performed following the standard protocol without intravenous contrast. Multiplanar CT image reconstructions of the cervical spine were also generated. COMPARISON:  None. FINDINGS: CT HEAD FINDINGS Brain: The ventricles are normal configuration. There is ventricular sulcal enlargement reflecting mild atrophy, somewhat  greater than generally seen in a patient of this age. There is no hydrocephalus. There are no parenchymal masses or mass effect. There is a small area of  encephalomalacia in the anterior left frontal lobe consistent with an old infarct. There is no evidence of a recent cortical infarct. Other areas of white matter hypoattenuation noted consistent with mild chronic microvascular ischemic change. There are no extra-axial masses or abnormal fluid collections. There is no intracranial hemorrhage. Vascular: No hyperdense vessel or unexpected calcification. Skull: Normal. Negative for fracture or focal lesion. Sinuses/Orbits: Visualized orbits are unremarkable. Visualized sinuses and mastoid air cells are clear. Other: None CT CERVICAL SPINE FINDINGS Alignment: Neck is held in flexion. Allowing for this, there is normal vertebral body alignment. No spondylolisthesis. Skull base and vertebrae: No fractures. There is an area sclerosis in the right clivus, which is nonspecific. No osteolytic lesions. Soft tissues and spinal canal: No spinal canal mass or hematoma. No soft tissue neck masses or enlarged lymph nodes. Disc levels: Mild loss of disc height at C2-C3. Moderate to marked loss of disc height from C3-C4 through T1-T2. There are endplate sclerosis and osteophytes. Mild neural foraminal narrowing noted on the right at C3-C4 from uncovertebral spurring. No other significant stenosis. Upper chest: Clear visualized upper lungs. Other: None IMPRESSION: HEAD CT: No acute intracranial abnormalities. No skull fracture. Small focus of sclerosis in the right clivus, which is nonspecific. Although likely benign, neoplastic disease is not excluded. Recommend either follow-up MRI with and without contrast versus follow-up head CT in 3-4 months to document stability. CERVICAL CT: No fracture or acute finding. Significant degenerative changes as described. Electronically Signed   By: Lajean Manes M.D.   On: 09/26/2015 15:21   Ct  Cervical Spine Wo Contrast  Result Date: 09/26/2015 CLINICAL DATA:  FALL TODAY FELL FORWARD, FEVER, CONFUSION, HX DM EXAM: CT HEAD WITHOUT CONTRAST CT CERVICAL SPINE WITHOUT CONTRAST TECHNIQUE: Multidetector CT imaging of the head and cervical spine was performed following the standard protocol without intravenous contrast. Multiplanar CT image reconstructions of the cervical spine were also generated. COMPARISON:  None. FINDINGS: CT HEAD FINDINGS Brain: The ventricles are normal configuration. There is ventricular sulcal enlargement reflecting mild atrophy, somewhat greater than generally seen in a patient of this age. There is no hydrocephalus. There are no parenchymal masses or mass effect. 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 There is no evidence of a recent cortical infarct. Other areas of white matter hypoattenuation noted consistent with mild chronic microvascular ischemic change. There are no extra-axial masses or abnormal fluid collections. There is no intracranial hemorrhage. Vascular: No hyperdense vessel or unexpected calcification. Skull: Normal. Negative for fracture or focal lesion. Sinuses/Orbits: Visualized orbits are unremarkable. Visualized sinuses and mastoid air cells are clear. Other: None CT CERVICAL SPINE FINDINGS Alignment: Neck is held in flexion. Allowing for this, there is normal vertebral body alignment. No spondylolisthesis. Skull base and vertebrae: No fractures. There is an area sclerosis in the right clivus, which is nonspecific. No osteolytic lesions. Soft tissues and spinal canal: No spinal canal mass or hematoma. No soft tissue neck masses or enlarged lymph nodes. Disc levels: Mild loss of disc height at C2-C3. Moderate to marked loss of disc height from C3-C4 through T1-T2. There are endplate sclerosis and osteophytes. Mild neural foraminal narrowing noted on the right at C3-C4 from uncovertebral spurring.  No other significant stenosis. Upper chest: Clear visualized upper lungs. Other: None IMPRESSION: HEAD CT: No acute intracranial abnormalities. No skull fracture. Small focus of sclerosis in the right clivus, which is nonspecific. Although likely benign, neoplastic disease is not excluded.  Recommend either follow-up MRI with and without contrast versus follow-up head CT in 3-4 months to document stability. CERVICAL CT: No fracture or acute finding. Significant degenerative changes as described. Electronically Signed   By: Lajean Manes M.D.   On: 09/26/2015 15:21   Ct Renal Stone Study  Result Date: 09/26/2015 CLINICAL DATA:  Fall today.  Fever and confusion. EXAM: CT ABDOMEN AND PELVIS WITHOUT CONTRAST TECHNIQUE: Multidetector CT imaging of the abdomen and pelvis was performed following the standard protocol without IV contrast. COMPARISON:  09/14/2015 FINDINGS: Lower chest:  No contributory findings. Hepatobiliary: Partial nonvisualization of the right liver. No focal liver abnormality.Cholelithiasis. No evidence of acute cholecystitis. Pancreas: Generalized fatty atrophy.  No acute finding. Spleen: Unremarkable. Adrenals/Urinary Tract: Negative adrenals. Chronic left UPJ calculus and hydronephrosis with renal cortical thinning. The UPJ stone measures up to 14 x 7 mm on coronal reformats. There is milk of calcium in the left-sided calices. Right nephrolithiasis with calculi measuring up to 8 mm. Asymmetric right perinephric stranding that is new from previous. The bladder is decompressed by Foley catheter, limiting assessment. Stomach/Bowel: No obstruction. No appendicitis. Mild colonic diverticulosis. Vascular/Lymphatic: No acute vascular abnormality. No mass or adenopathy. Reproductive:No acute finding Other: No ascites or pneumoperitoneum. Musculoskeletal: No acute abnormalities. Advanced facet arthropathy in the lower lumbar spine with grade 1 L4-5 anterolisthesis. Disc degeneration from L3-4 to L5-S1 and  in the lower thoracic spine. IMPRESSION: 1. Right perinephric edema, possible pyelonephritis in this clinical setting. No right hydronephrosis. 2. Bilateral nephrolithiasis including chronic left UPJ calculus causing left hydronephrosis. Left cortical atrophy. 3. Cholelithiasis without signs of cholecystitis. 4. History of fall with no posttraumatic finding. Electronically Signed   By: Monte Fantasia M.D.   On: 09/26/2015 15:21      Scheduled Meds: . ALPRAZolam  1 mg Oral BID  . busPIRone  10 mg Oral q morning - 10a  . cefTRIAXone (ROCEPHIN)  IV  2 g Intravenous Q24H  . chlorproMAZINE  25 mg Oral BID  . escitalopram  20 mg Oral QHS  . heparin  5,000 Units Subcutaneous Q8H  . levothyroxine  50 mcg Oral QAC breakfast  . mouth rinse  15 mL Mouth Rinse BID  . OLANZapine  20 mg Oral Daily  . oxybutynin  10 mg Oral q morning - 10a  . pantoprazole  40 mg Oral Daily  . sodium chloride flush  3 mL Intravenous Q12H  . zolpidem  5 mg Oral QHS   Continuous Infusions:    LOS: 2 days    Time spent in minutes: [place], MD [facility] Pager: [email or web address] Password [facility] 09/28/2015, 12:04 PM

## 2015-09-28 NOTE — Progress Notes (Signed)
2 Days Post-Op  Subjective:  1 - Left Ureteral, Right Renal Stones - left prox ureteral 41mm stoen by CT x several 2017, tentativley planning on left ureteroscopy by Dr. Jeffie Pollock at [facility] 10/24/15. Also non-obstrucitng approx 8mm Rt renal stone. Bilat JJ stents (6x24 contour) placed urgently 9/19 in setting of urosepsis.   2 - Urosepsis - fevers, tachycardia, mental status changes with bacteruria c/w urosepsis on presentation to ER 09/2015. UCX GNR / pending. [place] pending. Now on empiric rocephin with fever curve trending down.   Today "Rachel Morgan" is seen in f/u above. Leukocytosis resolved. Her sister "[name]" who is HCPOA is with her.   Objective: Vital signs in last 24 hours: Temp:  [98.2 F (36.8 C)-99.8 F (37.7 C)] 98.5 F (36.9 C) (09/21 1200) Pulse Rate:  [66-88] 66 (09/21 0800) Resp:  [17-27] 23 (09/21 1200) BP: (107-137)/(46-61) 107/46 (09/21 1200) SpO2:  [94 %-100 %] 95 % (09/21 1200) Weight:  [121.4 kg (267 lb 10.2 oz)] 121.4 kg (267 lb 10.2 oz) (09/21 0500) Last BM Date: 09/23/15  Intake/Output from previous day: 09/20 0701 - 09/21 0700 In: 1010 [P.O.:960; IV Piggyback:50] Out: 2400 [Urine:2400] Intake/Output this shift: Total I/O In: 40 [P.O.:40] Out: 400 [Urine:400]  General appearance: alert, cooperative and sister at bedside Eyes: negative Nose: Nares normal. Septum midline. Mucosa normal. No drainage or sinus tenderness. Throat: lips, mucosa, and tongue normal; teeth and gums normal Neck: supple, symmetrical, trachea midline Back: symmetric, no curvature. ROM normal. No CVA tenderness. Resp: non-labored on room air Pelvic: external genitalia normal and foley in place with yellow urine.  Extremities: extremities normal, atraumatic, no cyanosis or edema Lymph nodes: Cervical, supraclavicular, and axillary nodes normal. Neurologic: Mental status: at baseline per sister with stigmata of moderate mental retardation.   Lab Results:   Recent Labs   09/27/15 0755 09/28/15 0346  WBC 14.5* 9.8  HGB 11.0* 10.6*  HCT 34.0* 32.3*  PLT 281 258   BMET  Recent Labs  09/27/15 0755 09/28/15 0346  NA 142 142  K 4.4 3.8  CL 113* 113*  CO2 22 23  GLUCOSE 102* 103*  BUN 20 15  CREATININE 1.36* 1.35*  CALCIUM 8.0* 8.1*   PT/INR  Recent Labs  09/26/15 1711  LABPROT 16.3*  INR 1.30   ABG No results for input(s): PHART, HCO3 in the last 72 hours.  Invalid input(s): PCO2, PO2  Studies/Results: Ct Head Wo Contrast  Result Date: 09/26/2015 CLINICAL DATA:  FALL TODAY FELL FORWARD, FEVER, CONFUSION, HX DM EXAM: CT HEAD WITHOUT CONTRAST CT CERVICAL SPINE WITHOUT CONTRAST TECHNIQUE: Multidetector CT imaging of the head and cervical spine was performed following the standard protocol without intravenous contrast. Multiplanar CT image reconstructions of the cervical spine were also generated. COMPARISON:  None. FINDINGS: CT HEAD FINDINGS Brain: The ventricles are normal configuration. There is ventricular sulcal enlargement reflecting mild atrophy, somewhat greater than generally seen in a patient of this age. There is no hydrocephalus. There are no parenchymal masses or mass effect. 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 There is no evidence of a recent cortical infarct. Other areas of white matter hypoattenuation noted consistent with mild chronic microvascular ischemic change. There are no extra-axial masses or abnormal fluid collections. There is no intracranial hemorrhage. Vascular: No hyperdense vessel or unexpected calcification. Skull: Normal. Negative for fracture or focal lesion. Sinuses/Orbits: Visualized orbits are unremarkable. Visualized sinuses and mastoid air cells are clear. Other: None  CT CERVICAL SPINE FINDINGS Alignment: Neck is held in flexion. Allowing for this, there is normal vertebral body alignment. No spondylolisthesis. Skull base and vertebrae: No  fractures. There is an area sclerosis in the right clivus, which is nonspecific. No osteolytic lesions. Soft tissues and spinal canal: No spinal canal mass or hematoma. No soft tissue neck masses or enlarged lymph nodes. Disc levels: Mild loss of disc height at C2-C3. Moderate to marked loss of disc height from C3-C4 through T1-T2. There are endplate sclerosis and osteophytes. Mild neural foraminal narrowing noted on the right at C3-C4 from uncovertebral spurring. No other significant stenosis. Upper chest: Clear visualized upper lungs. Other: None IMPRESSION: HEAD CT: No acute intracranial abnormalities. No skull fracture. Small focus of sclerosis in the right clivus, which is nonspecific. Although likely benign, neoplastic disease is not excluded. Recommend either follow-up MRI with and without contrast versus follow-up head CT in 3-4 months to document stability. CERVICAL CT: No fracture or acute finding. Significant degenerative changes as described. Electronically Signed   By: Lajean Manes M.D.   On: 09/26/2015 15:21   Ct Cervical Spine Wo Contrast  Result Date: 09/26/2015 CLINICAL DATA:  FALL TODAY FELL FORWARD, FEVER, CONFUSION, HX DM EXAM: CT HEAD WITHOUT CONTRAST CT CERVICAL SPINE WITHOUT CONTRAST TECHNIQUE: Multidetector CT imaging of the head and cervical spine was performed following the standard protocol without intravenous contrast. Multiplanar CT image reconstructions of the cervical spine were also generated. COMPARISON:  None. FINDINGS: CT HEAD FINDINGS Brain: The ventricles are normal configuration. There is ventricular sulcal enlargement reflecting mild atrophy, somewhat greater than generally seen in a patient of this age. There is no hydrocephalus. There are no parenchymal masses or mass effect. 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 There is no evidence of a recent cortical infarct. Other areas of white matter  hypoattenuation noted consistent with mild chronic microvascular ischemic change. There are no extra-axial masses or abnormal fluid collections. There is no intracranial hemorrhage. Vascular: No hyperdense vessel or unexpected calcification. Skull: Normal. Negative for fracture or focal lesion. Sinuses/Orbits: Visualized orbits are unremarkable. Visualized sinuses and mastoid air cells are clear. Other: None CT CERVICAL SPINE FINDINGS Alignment: Neck is held in flexion. Allowing for this, there is normal vertebral body alignment. No spondylolisthesis. Skull base and vertebrae: No fractures. There is an area sclerosis in the right clivus, which is nonspecific. No osteolytic lesions. Soft tissues and spinal canal: No spinal canal mass or hematoma. No soft tissue neck masses or enlarged lymph nodes. Disc levels: Mild loss of disc height at C2-C3. Moderate to marked loss of disc height from C3-C4 through T1-T2. There are endplate sclerosis and osteophytes. Mild neural foraminal narrowing noted on the right at C3-C4 from uncovertebral spurring. No other significant stenosis. Upper chest: Clear visualized upper lungs. Other: None IMPRESSION: HEAD CT: No acute intracranial abnormalities. No skull fracture. Small focus of sclerosis in the right clivus, which is nonspecific. Although likely benign, neoplastic disease is not excluded. Recommend either follow-up MRI with and without contrast versus follow-up head CT in 3-4 months to document stability. CERVICAL CT: No fracture or acute finding. Significant degenerative changes as described. Electronically Signed   By: Lajean Manes M.D.   On: 09/26/2015 15:21   Ct Renal Stone Study  Result Date: 09/26/2015 CLINICAL DATA:  Fall today.  Fever and confusion. EXAM: CT ABDOMEN AND PELVIS WITHOUT CONTRAST TECHNIQUE: Multidetector CT imaging of the abdomen and pelvis  was performed following the standard protocol without IV contrast. COMPARISON:  09/14/2015 FINDINGS: Lower chest:   No contributory findings. Hepatobiliary: Partial nonvisualization of the right liver. No focal liver abnormality.Cholelithiasis. No evidence of acute cholecystitis. Pancreas: Generalized fatty atrophy.  No acute finding. Spleen: Unremarkable. Adrenals/Urinary Tract: Negative adrenals. Chronic left UPJ calculus and hydronephrosis with renal cortical thinning. The UPJ stone measures up to 14 x 7 mm on coronal reformats. There is milk of calcium in the left-sided calices. Right nephrolithiasis with calculi measuring up to 8 mm. Asymmetric right perinephric stranding that is new from previous. The bladder is decompressed by Foley catheter, limiting assessment. Stomach/Bowel: No obstruction. No appendicitis. Mild colonic diverticulosis. Vascular/Lymphatic: No acute vascular abnormality. No mass or adenopathy. Reproductive:No acute finding Other: No ascites or pneumoperitoneum. Musculoskeletal: No acute abnormalities. Advanced facet arthropathy in the lower lumbar spine with grade 1 L4-5 anterolisthesis. Disc degeneration from L3-4 to L5-S1 and in the lower thoracic spine. IMPRESSION: 1. Right perinephric edema, possible pyelonephritis in this clinical setting. No right hydronephrosis. 2. Bilateral nephrolithiasis including chronic left UPJ calculus causing left hydronephrosis. Left cortical atrophy. 3. Cholelithiasis without signs of cholecystitis. 4. History of fall with no posttraumatic finding. Electronically Signed   By: Monte Fantasia M.D.   On: 09/26/2015 15:21    Anti-infectives: Anti-infectives    Start     Dose/Rate Route Frequency Ordered Stop   09/28/15 0600  vancomycin (VANCOCIN) 1,250 mg in sodium chloride 0.9 % 250 mL IVPB  Status:  Discontinued     1,250 mg 166.7 mL/hr over 90 Minutes Intravenous Every 24 hours 09/27/15 1031 09/27/15 1206   09/27/15 1400  levofloxacin (LEVAQUIN) IVPB 750 mg  Status:  Discontinued     750 mg 100 mL/hr over 90 Minutes Intravenous Every 24 hours 09/26/15 2006  09/27/15 1207   09/27/15 1400  cefTRIAXone (ROCEPHIN) 2 g in dextrose 5 % 50 mL IVPB     2 g 100 mL/hr over 30 Minutes Intravenous Every 24 hours 09/27/15 1207     09/27/15 0400  vancomycin (VANCOCIN) IVPB 1000 mg/200 mL premix  Status:  Discontinued     1,000 mg 200 mL/hr over 60 Minutes Intravenous Every 12 hours 09/26/15 2006 09/27/15 1031   09/26/15 2200  aztreonam (AZACTAM) 1 g in dextrose 5 % 50 mL IVPB  Status:  Discontinued     1 g 100 mL/hr over 30 Minutes Intravenous Every 8 hours 09/26/15 2006 09/27/15 1207   09/26/15 1400  vancomycin (VANCOCIN) 1,500 mg in sodium chloride 0.9 % 500 mL IVPB     1,500 mg 250 mL/hr over 120 Minutes Intravenous  Once 09/26/15 1328 09/26/15 1740   09/26/15 1315  levofloxacin (LEVAQUIN) IVPB 750 mg     750 mg 100 mL/hr over 90 Minutes Intravenous  Once 09/26/15 1310 09/26/15 1532   09/26/15 1315  aztreonam (AZACTAM) 2 g in dextrose 5 % 50 mL IVPB     2 g 100 mL/hr over 30 Minutes Intravenous  Once 09/26/15 1310 09/26/15 1425   09/26/15 1315  vancomycin (VANCOCIN) IVPB 1000 mg/200 mL premix  Status:  Discontinued     1,000 mg 200 mL/hr over 60 Minutes Intravenous  Once 09/26/15 1310 09/26/15 1328      Assessment/Plan:  1 - Left Ureteral, Right Renal Stones - now temporized with bilat stents. Proceed as planned with ureteroscopy 10/2015 at [facility].   2 - Urosepsis - improving clinically. Agree with current ABX pending final CX data. Pt's sister updated on  plan.   OK for DC from GU perspective with approx 2 week total course ABX when afebrile 24 hours and CX data regarding sensitivities final.  Please call me directly with questions anytime.    [facility], Shaurya Rawdon 09/28/2015

## 2015-09-29 DIAGNOSIS — A4151 Sepsis due to Escherichia coli [E. coli]: Secondary | ICD-10-CM

## 2015-09-29 DIAGNOSIS — G808 Other cerebral palsy: Secondary | ICD-10-CM

## 2015-09-29 LAB — URINE CULTURE: Culture: >=100000 — AB

## 2015-09-29 LAB — BASIC METABOLIC PANEL
ANION GAP: 7 (ref 5–15)
BUN: 17 mg/dL (ref 6–20)
CALCIUM: 8.1 mg/dL — AB (ref 8.9–10.3)
CO2: 25 mmol/L (ref 22–32)
Chloride: 109 mmol/L (ref 101–111)
Creatinine, Ser: 1.3 mg/dL — ABNORMAL HIGH (ref 0.44–1.00)
GFR calc Af Amer: 50 mL/min — ABNORMAL LOW (ref >60)
GFR calc non Af Amer: 43 mL/min — ABNORMAL LOW (ref >60)
GLUCOSE: 108 mg/dL — AB (ref 65–99)
POTASSIUM: 3.7 mmol/L (ref 3.5–5.1)
Sodium: 141 mmol/L (ref 135–145)

## 2015-09-29 MED ORDER — CIPROFLOXACIN HCL 500 MG PO TABS: 500.0000 mg | ORAL_TABLET | Freq: Two times a day (BID) | ORAL | 0 refills | Status: DC

## 2015-09-29 NOTE — Discharge Instructions (Signed)

## 2015-09-29 NOTE — Discharge Summary (Signed)
Physician Discharge Summary  Rachel Morgan CNO:[id number] DOB: 07-08-1950 DOA: 09/26/2015  PCP: [facility]  Admit date: 09/26/2015 Discharge date: 09/29/2015  Admitted From:home Disposition:  home   Recommendations for Outpatient Follow-up:  1. Urology f/u  Discharge Condition:  stable   CODE STATUS:  Full code   Diet recommendation:  Heart healthy Consultations:  Urology    Discharge Diagnoses:  Principal Problem:   Acute pyelonephritis Active Problems:   UPJ obstruction, acquired   Sepsis ([place])   AKI (acute kidney injury) ([place])   Nephrolithiasis   Hypothyroidism   Hypertension   CKD (chronic kidney disease), stage III   GERD (gastroesophageal reflux disease)   Calculus of gallbladder without cholecystitis without obstruction   Cerebral palsy (HCC)    Subjective: No complaints  Brief Summary: Rachel Whiters Wilsonis a 65 y.o.femalewith medical history significant for cerebral palsy, hypothyroidism, hypertension, cholelithiasis, and 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 She was brought into the hospital for abdominal pain and fever after being found on the bathroom floor in her family. According to the patient's sister whom she lives with, the patient has fallen frequently since recently being discharged from [place] on 09/17/15. She has a chronic left ureteral stone and plans were for her to undergo ureteroscopy on 10/24/15 by Dr Jeffie Pollock. In the ER she was noted to be tachycardic with a fever of 101.9 and had a UA that was positive for infection. She was admitted for sepsis secondary to UTI. CT scan revealed nephrolithiasis resulting in UPJ obstruction on the left and was suggestive of pyelonephritis on the right. She also had a nonobstructing 6 mm right renal stone. The patient was evaluated by urology and underwent double J stent  placement on 9/19.  Hospital Course:  Principal Problem: Left nephrolithiasis with UPJ obstruction, right nonobstructing nephrolithiasis -Status post bilateral double-J stent placement by Dr. Tresa Moore  Active Problems: UTI/pyelonephritis/sepsis -Fever improved to 99-WBC count improving-no longer tachycardic-did not have a lactic acidosis- - Change vancomycin, Azactam, Levaquin to Rocephin and follow-up on cultures - U culture growing > 100, 000 e coli pansensitive -   have switched to Cipro - she will complete a 14 day course - she has a f/u appt with Urology next moth    AKI (acute kidney injury) CKD3 - Creatinine 1.87 on admission-baseline is about 1.2-1.3 -Returned to baseline  -AKI Likely secondary to obstructive uropathy and possibly dehydration    Hypothyroidism -Continue Synthroid    Hypertension -Aldactone      Calculus of gallbladder without cholecystitis without obstruction -Noted incidentally on CT scan  Cerebral palsy -Stable-at baseline she is able to ambulate and feed herself, but unable to hold a conversation-lives with sister who is her primary caretaker -Continue BuSpar, chlorpromazine, Lexapro, Zyprexa, Ambien   Discharge Instructions  Discharge Instructions    Diet - low sodium heart healthy    Complete by:  As directed    Increase activity slowly    Complete by:  As directed        Medication List    TAKE these medications   ALPRAZolam 1 MG tablet Commonly known as:  XANAX Take 1 mg by mouth 2 (two) times daily.   aspirin EC 81 MG tablet Take 81 mg by mouth every morning.   busPIRone 10 MG tablet Commonly known as:  BUSPAR Take 10 mg by mouth every morning.   chlorproMAZINE  25 MG tablet Commonly known as:  THORAZINE Take 25 mg by mouth 2 (two) times daily.   ciprofloxacin 500 MG tablet Commonly known as:  CIPRO Take 1 tablet (500 mg total) by mouth 2 (two) times daily.   escitalopram 20 MG tablet Commonly known as:   LEXAPRO Take 20 mg by mouth at bedtime.   levothyroxine 50 MCG tablet Commonly known as:  SYNTHROID, LEVOTHROID Take 50 mcg by mouth daily before breakfast.   OLANZapine 20 MG tablet Commonly known as:  ZYPREXA Take 20 mg by mouth daily.   omeprazole 20 MG capsule Commonly known as:  PRILOSEC Take 20 mg by mouth every morning.   oxybutynin 10 MG 24 hr tablet Commonly known as:  DITROPAN-XL Take 10 mg by mouth every morning.   spironolactone 50 MG tablet Commonly known as:  ALDACTONE Take 50 mg by mouth 2 (two) times daily.   traMADol 50 MG tablet Commonly known as:  ULTRAM Take 50 mg by mouth every 6 (six) hours as needed for severe pain.   zolpidem 10 MG tablet Commonly known as:  AMBIEN Take 10 mg by mouth at bedtime.      Follow-up Information    [name], MD .   Specialty:  Urology Contact information: [address] [place] [address] [phone]          Allergies  Allergen Reactions  . Macrobid [Nitrofurantoin [employer]  . Penicillins      Procedures/Studies:  9/19-- CYSTOSCOPY WITH Bilateral RETROGRADE PYELOGRAM/URETERAL STENT PLACEMENT   Ct Abdomen Pelvis Wo Contrast  Result Date: 09/14/2015 CLINICAL DATA:  Pt sent from home referred by [facility] due to pain in RUQ for one week. Family reports that she has loss of appetite and constipation. EXAM: CT ABDOMEN AND PELVIS WITHOUT CONTRAST TECHNIQUE: Multidetector CT imaging of the abdomen and pelvis was performed following the standard protocol without IV contrast. COMPARISON:  07/25/2015 FINDINGS: Despite efforts by the technologist and patient, motion artifact is present on today's exam and could not be eliminated. This reduces exam sensitivity and specificity. Lower chest: Coronary artery atherosclerosis. Mild enlargement of the cardiopericardial silhouette gas-filled distal esophagus, significance uncertain. Hepatobiliary: Numerous gallstones in the gallbladder  measuring up to approximately 0.8 cm. Punctate calcification in segment 5 of the liver, image 26/2, unchanged, likely due to remote inflammation. Mild chronic prominence of the extrahepatic biliary tree at 10 mm diameter. Pancreas: Diffuse fatty atrophy of the pancreas. Spleen: Unremarkable Adrenals/Urinary Tract: Adrenal glands normal. Bilateral nonobstructive nephrolithiasis. On the left side there is prominent cortical thinning, hydronephrosis, and a 1.7 cm in long axis UPJ stone which is likely obstructive. The thinning of the cortex suggests chronic obstruction with resulting renal atrophy. The previous left renal pelvis calculus appears to of migrated back into the lower pole of the left kidney along with several other calculi in this vicinity. No ureteral or bladder calculus is seen. Stomach/Bowel: There is prominence in stool in the proximal 2/3 of the colon. Appendix normal. Vascular/Lymphatic: Mild aortoiliac atherosclerotic vascular disease. Small periaortic lymph nodes observed. No overtly pathologic adenopathy observed. Reproductive: Unremarkable Other: No supplemental non-categorized findings. Musculoskeletal: Abdominal wall laxity. Generalized muscular atrophy for age. Chronic calcifications in both distal iliopsoas tendons. Exaggerated lumbar lordosis with grade 1 degenerative anterolisthesis at L4-5, and lumbar spondylosis and degenerative disc disease causing impingement at L3-4, L4-5, and L5-S1. IMPRESSION: 1. Mild dilatation of the CBD, at about 10 mm. This is stable from 07/25/2015 but increased from 02/12/2011.  I do not directly see choledocholithiasis although CT is relatively insensitive for such, especially with the degree of motion artifact shown on today's exam. 2. Bilateral renal calculi. On the left there is a 1.7 cm in long axis UPJ stone with associated left hydronephrosis, as well as chronic appearing considerable cortical thinning in the left kidney, atrophy probably related to  chronic UPJ obstruction. 3. Mild constipation with prominence of stool in the proximal 2/3 of the colon. 4. Coronary and aortoiliac atherosclerosis.  Mild cardiomegaly. 5. Cholelithiasis. 6. Generalized muscular atrophy. 7. Lumbar spondylosis and degenerative disc disease causing impingement at L3-4, L4-5, and L5-S1. Electronically Signed   By: Van Clines M.D.   On: 09/14/2015 18:49   Dg Chest 2 View  Result Date: 09/26/2015 CLINICAL DATA:  Shortness of breath and abdominal pain EXAM: CHEST  2 VIEW COMPARISON:  02/12/2011 FINDINGS: Chronic cardiomegaly. Stable mediastinal contours. Low volume chest. There is no edema, consolidation, effusion, or pneumothorax. No acute osseous finding. IMPRESSION: No acute finding. Electronically Signed   By: Monte Fantasia M.D.   On: 09/26/2015 11:46   Ct Head Wo Contrast  Result Date: 09/26/2015 CLINICAL DATA:  FALL TODAY FELL FORWARD, FEVER, CONFUSION, HX DM EXAM: CT HEAD WITHOUT CONTRAST CT CERVICAL SPINE WITHOUT CONTRAST TECHNIQUE: Multidetector CT imaging of the head and cervical spine was performed following the standard protocol without intravenous contrast. Multiplanar CT image reconstructions of the cervical spine were also generated. COMPARISON:  None. FINDINGS: CT HEAD FINDINGS Brain: The ventricles are normal configuration. There is ventricular sulcal enlargement reflecting mild atrophy, somewhat greater than generally seen in a patient of this age. There is no hydrocephalus. There are no parenchymal masses or mass effect. 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 There is no evidence of a recent cortical infarct. Other areas of white matter hypoattenuation noted consistent with mild chronic microvascular ischemic change. There are no extra-axial masses or abnormal fluid collections. There is no intracranial hemorrhage. Vascular: No hyperdense vessel or unexpected calcification. Skull:  Normal. Negative for fracture or focal lesion. Sinuses/Orbits: Visualized orbits are unremarkable. Visualized sinuses and mastoid air cells are clear. Other: None CT CERVICAL SPINE FINDINGS Alignment: Neck is held in flexion. Allowing for this, there is normal vertebral body alignment. No spondylolisthesis. Skull base and vertebrae: No fractures. There is an area sclerosis in the right clivus, which is nonspecific. No osteolytic lesions. Soft tissues and spinal canal: No spinal canal mass or hematoma. No soft tissue neck masses or enlarged lymph nodes. Disc levels: Mild loss of disc height at C2-C3. Moderate to marked loss of disc height from C3-C4 through T1-T2. There are endplate sclerosis and osteophytes. Mild neural foraminal narrowing noted on the right at C3-C4 from uncovertebral spurring. No other significant stenosis. Upper chest: Clear visualized upper lungs. Other: None IMPRESSION: HEAD CT: No acute intracranial abnormalities. No skull fracture. Small focus of sclerosis in the right clivus, which is nonspecific. Although likely benign, neoplastic disease is not excluded. Recommend either follow-up MRI with and without contrast versus follow-up head CT in 3-4 months to document stability. CERVICAL CT: No fracture or acute finding. Significant degenerative changes as described. Electronically Signed   By: Lajean Manes M.D.   On: 09/26/2015 15:21   Ct Cervical Spine Wo Contrast  Result Date: 09/26/2015 CLINICAL DATA:  FALL TODAY FELL FORWARD, FEVER, CONFUSION, HX DM EXAM: CT HEAD WITHOUT CONTRAST CT CERVICAL SPINE WITHOUT CONTRAST TECHNIQUE: Multidetector CT imaging  of the head and cervical spine was performed following the standard protocol without intravenous contrast. Multiplanar CT image reconstructions of the cervical spine were also generated. COMPARISON:  None. FINDINGS: CT HEAD FINDINGS Brain: The ventricles are normal configuration. There is ventricular sulcal enlargement reflecting mild  atrophy, somewhat greater than generally seen in a patient of this age. There is no hydrocephalus. There are no parenchymal masses or mass effect. 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 There is no evidence of a recent cortical infarct. Other areas of white matter hypoattenuation noted consistent with mild chronic microvascular ischemic change. There are no extra-axial masses or abnormal fluid collections. There is no intracranial hemorrhage. Vascular: No hyperdense vessel or unexpected calcification. Skull: Normal. Negative for fracture or focal lesion. Sinuses/Orbits: Visualized orbits are unremarkable. Visualized sinuses and mastoid air cells are clear. Other: None CT CERVICAL SPINE FINDINGS Alignment: Neck is held in flexion. Allowing for this, there is normal vertebral body alignment. No spondylolisthesis. Skull base and vertebrae: No fractures. There is an area sclerosis in the right clivus, which is nonspecific. No osteolytic lesions. Soft tissues and spinal canal: No spinal canal mass or hematoma. No soft tissue neck masses or enlarged lymph nodes. Disc levels: Mild loss of disc height at C2-C3. Moderate to marked loss of disc height from C3-C4 through T1-T2. There are endplate sclerosis and osteophytes. Mild neural foraminal narrowing noted on the right at C3-C4 from uncovertebral spurring. No other significant stenosis. Upper chest: Clear visualized upper lungs. Other: None IMPRESSION: HEAD CT: No acute intracranial abnormalities. No skull fracture. Small focus of sclerosis in the right clivus, which is nonspecific. Although likely benign, neoplastic disease is not excluded. Recommend either follow-up MRI with and without contrast versus follow-up head CT in 3-4 months to document stability. CERVICAL CT: No fracture or acute finding. Significant degenerative changes as described. Electronically Signed   By: Lajean Manes M.D.   On:  09/26/2015 15:21   Ct Renal Stone Study  Result Date: 09/26/2015 CLINICAL DATA:  Fall today.  Fever and confusion. EXAM: CT ABDOMEN AND PELVIS WITHOUT CONTRAST TECHNIQUE: Multidetector CT imaging of the abdomen and pelvis was performed following the standard protocol without IV contrast. COMPARISON:  09/14/2015 FINDINGS: Lower chest:  No contributory findings. Hepatobiliary: Partial nonvisualization of the right liver. No focal liver abnormality.Cholelithiasis. No evidence of acute cholecystitis. Pancreas: Generalized fatty atrophy.  No acute finding. Spleen: Unremarkable. Adrenals/Urinary Tract: Negative adrenals. Chronic left UPJ calculus and hydronephrosis with renal cortical thinning. The UPJ stone measures up to 14 x 7 mm on coronal reformats. There is milk of calcium in the left-sided calices. Right nephrolithiasis with calculi measuring up to 8 mm. Asymmetric right perinephric stranding that is new from previous. The bladder is decompressed by Foley catheter, limiting assessment. Stomach/Bowel: No obstruction. No appendicitis. Mild colonic diverticulosis. Vascular/Lymphatic: No acute vascular abnormality. No mass or adenopathy. Reproductive:No acute finding Other: No ascites or pneumoperitoneum. Musculoskeletal: No acute abnormalities. Advanced facet arthropathy in the lower lumbar spine with grade 1 L4-5 anterolisthesis. Disc degeneration from L3-4 to L5-S1 and in the lower thoracic spine. IMPRESSION: 1. Right perinephric edema, possible pyelonephritis in this clinical setting. No right hydronephrosis. 2. Bilateral nephrolithiasis including chronic left UPJ calculus causing left hydronephrosis. Left cortical atrophy. 3. Cholelithiasis without signs of cholecystitis. 4. History of fall with no posttraumatic finding. Electronically Signed   By: Monte Fantasia M.D.   On: 09/26/2015 15:21   US Abdomen  Limited Ruq  Result Date: 09/16/2015 CLINICAL DATA:  Right upper quadrant abdominal pain for 1 week.  EXAM: US ABDOMEN LIMITED - RIGHT UPPER QUADRANT COMPARISON:  CT scan of September 14, 2015. FINDINGS: Gallbladder: 8 mm gallstone is noted without gallbladder wall thickening or pericholecystic fluid. No sonographic Murphy's sign is noted. Common bile duct: Diameter: 5 mm which is within normal limits. Liver: No focal lesion identified. Within normal limits in parenchymal echogenicity. IMPRESSION: Cholelithiasis without gallbladder wall thickening or pericholecystic fluid. If there is clinical concern for cholecystitis, HIDA scan may be performed further evaluation. Electronically Signed   By: Marijo Conception, M.D.   On: 09/16/2015 12:04       Discharge Exam: Vitals:   09/28/15 2046 09/29/15 0559  BP: (!) 127/51 (!) 122/58  Pulse: 81 81  Resp: 16 16  Temp: 98.2 F (36.8 C) 99 F (37.2 C)   Vitals:   09/28/15 1400 09/28/15 1602 09/28/15 2046 09/29/15 0559  BP: (!) 95/47 (!) 101/46 (!) 127/51 (!) 122/58  Pulse:  68 81 81  Resp: 16 16 16 16   Temp:  97.7 F (36.5 C) 98.2 F (36.8 C) 99 F (37.2 C)  TempSrc:  Oral Oral Oral  SpO2:  98% 100% 93%  Weight:      Height:        General: Pt is alert, awake, not in acute distress Cardiovascular: RRR, S1/S2 +, no rubs, no gallops Respiratory: CTA bilaterally, no wheezing, no rhonchi Abdominal: Soft, NT, ND, bowel sounds + Extremities: no edema, no cyanosis    The results of significant diagnostics from this hospitalization (including imaging, microbiology, ancillary and laboratory) are listed below for reference.     Microbiology: Recent Results (from the past 240 hour(s))  Urine culture     Status: Abnormal   Collection Time: 09/26/15 11:31 AM  Result Value Ref Range Status   Specimen Description URINE, RANDOM  Final   Special Requests NONE  Final   Culture >=100,000 COLONIES/mL ESCHERICHIA COLI (A)  Final   Report Status 09/29/2015 FINAL  Final   Organism ID, Bacteria ESCHERICHIA COLI (A)  Final      Susceptibility    Escherichia coli - MIC*    AMPICILLIN <=2 SENSITIVE Sensitive     CEFAZOLIN <=4 SENSITIVE Sensitive     CEFTRIAXONE <=1 SENSITIVE Sensitive     CIPROFLOXACIN <=0.25 SENSITIVE Sensitive     GENTAMICIN <=1 SENSITIVE Sensitive     IMIPENEM <=0.25 SENSITIVE Sensitive     NITROFURANTOIN <=16 SENSITIVE Sensitive     TRIMETH/SULFA <=20 SENSITIVE Sensitive     AMPICILLIN/SULBACTAM <=2 SENSITIVE Sensitive     PIP/TAZO <=4 SENSITIVE Sensitive     Extended ESBL NEGATIVE Sensitive     * >=100,000 COLONIES/mL ESCHERICHIA COLI  Blood Culture (routine x 2)     Status: None (Preliminary result)   Collection Time: 09/26/15  1:40 PM  Result Value Ref Range Status   Specimen Description BLOOD LEFT FOREARM  Final   Special Requests BOTTLES DRAWN AEROBIC AND ANAEROBIC 6CC EACH  Final   Culture NO GROWTH 2 DAYS  Final   Report Status PENDING  Incomplete  Blood Culture (routine x 2)     Status: None (Preliminary result)   Collection Time: 09/26/15  1:47 PM  Result Value Ref Range Status   Specimen Description BLOOD RIGHT HAND  Final   Special Requests BOTTLES DRAWN AEROBIC AND ANAEROBIC 6CC EACH  Final   Culture NO GROWTH 2 DAYS  Final  Report Status PENDING  Incomplete  Surgical pcr screen     Status: Abnormal   Collection Time: 09/26/15  8:07 PM  Result Value Ref Range Status   MRSA, PCR NEGATIVE NEGATIVE Final   Staphylococcus aureus POSITIVE (A) NEGATIVE Final    Comment:        The Xpert SA Assay (FDA approved for NASAL specimens in patients over 1 years of age), is one component of a comprehensive surveillance program.  Test performance has been validated by [facility] for patients greater than or equal to 19 year old. It is not intended to diagnose infection nor to guide or monitor treatment.      Labs: BNP (last 3 results) No results for input(s): BNP in the last 8760 hours. Basic Metabolic Panel:  Recent Labs Lab 09/26/15 1124 09/27/15 0755 09/28/15 0346 09/29/15 0407   NA 140 142 142 141  K 3.9 4.4 3.8 3.7  CL 108 113* 113* 109  CO2 20* 22 23 25   GLUCOSE 146* 102* 103* 108*  BUN 28* 20 15 17   CREATININE 1.87* 1.36* 1.35* 1.30*  CALCIUM 8.8* 8.0* 8.1* 8.1*   Liver Function Tests:  Recent Labs Lab 09/26/15 1124  AST 18  ALT 19  ALKPHOS 56  BILITOT 0.6  PROT 6.2*  ALBUMIN 2.7*    Recent Labs Lab 09/26/15 1124  LIPASE 11   No results for input(s): AMMONIA in the last 168 hours. CBC:  Recent Labs Lab 09/26/15 1124 09/27/15 0755 09/28/15 0346  WBC 18.0* 14.5* 9.8  NEUTROABS 14.3*  --   --   HGB 12.0 11.0* 10.6*  HCT 36.4 34.0* 32.3*  MCV 89.7 90.4 90.2  PLT 267 281 258   Cardiac Enzymes:  Recent Labs Lab 09/26/15 1711  TROPONINI <0.03   BNP: Invalid input(s): POCBNP CBG:  Recent Labs Lab 09/26/15 1819 09/26/15 2204 09/27/15 0827 09/28/15 0807  GLUCAP 83 126* 128* 104*   D-Dimer No results for input(s): DDIMER in the last 72 hours. Hgb A1c No results for input(s): HGBA1C in the last 72 hours. Lipid Profile No results for input(s): CHOL, HDL, LDLCALC, TRIG, CHOLHDL, LDLDIRECT in the last 72 hours. Thyroid function studies No results for input(s): TSH, T4TOTAL, T3FREE, THYROIDAB in the last 72 hours.  Invalid input(s): FREET3 Anemia work up No results for input(s): VITAMINB12, FOLATE, FERRITIN, TIBC, IRON, RETICCTPCT in the last 72 hours. Urinalysis    Component Value Date/Time   COLORURINE AMBER (A) 09/26/2015 1125   APPEARANCEUR HAZY (A) 09/26/2015 1125   LABSPEC 1.020 09/26/2015 1125   PHURINE 6.0 09/26/2015 1125   GLUCOSEU NEGATIVE 09/26/2015 1125   HGBUR LARGE (A) 09/26/2015 1125   BILIRUBINUR SMALL (A) 09/26/2015 1125   KETONESUR NEGATIVE 09/26/2015 1125   PROTEINUR 100 (A) 09/26/2015 1125   UROBILINOGEN 1.0 02/11/2011 2035   NITRITE POSITIVE (A) 09/26/2015 1125   LEUKOCYTESUR MODERATE (A) 09/26/2015 1125   Sepsis Labs Invalid input(s): PROCALCITONIN,  WBC,  LACTICIDVEN Microbiology Recent  Results (from the past 240 hour(s))  Urine culture     Status: Abnormal   Collection Time: 09/26/15 11:31 AM  Result Value Ref Range Status   Specimen Description URINE, RANDOM  Final   Special Requests NONE  Final   Culture >=100,000 COLONIES/mL ESCHERICHIA COLI (A)  Final   Report Status 09/29/2015 FINAL  Final   Organism ID, Bacteria ESCHERICHIA COLI (A)  Final      Susceptibility   Escherichia coli - MIC*    AMPICILLIN <=2 SENSITIVE Sensitive  CEFAZOLIN <=4 SENSITIVE Sensitive     CEFTRIAXONE <=1 SENSITIVE Sensitive     CIPROFLOXACIN <=0.25 SENSITIVE Sensitive     GENTAMICIN <=1 SENSITIVE Sensitive     IMIPENEM <=0.25 SENSITIVE Sensitive     NITROFURANTOIN <=16 SENSITIVE Sensitive     TRIMETH/SULFA <=20 SENSITIVE Sensitive     AMPICILLIN/SULBACTAM <=2 SENSITIVE Sensitive     PIP/TAZO <=4 SENSITIVE Sensitive     Extended ESBL NEGATIVE Sensitive     * >=100,000 COLONIES/mL ESCHERICHIA COLI  Blood Culture (routine x 2)     Status: None (Preliminary result)   Collection Time: 09/26/15  1:40 PM  Result Value Ref Range Status   Specimen Description BLOOD LEFT FOREARM  Final   Special Requests BOTTLES DRAWN AEROBIC AND ANAEROBIC 6CC EACH  Final   Culture NO GROWTH 2 DAYS  Final   Report Status PENDING  Incomplete  Blood Culture (routine x 2)     Status: None (Preliminary result)   Collection Time: 09/26/15  1:47 PM  Result Value Ref Range Status   Specimen Description BLOOD RIGHT HAND  Final   Special Requests BOTTLES DRAWN AEROBIC AND ANAEROBIC 6CC EACH  Final   Culture NO GROWTH 2 DAYS  Final   Report Status PENDING  Incomplete  Surgical pcr screen     Status: Abnormal   Collection Time: 09/26/15  8:07 PM  Result Value Ref Range Status   MRSA, PCR NEGATIVE NEGATIVE Final   Staphylococcus aureus POSITIVE (A) NEGATIVE Final    Comment:        The Xpert SA Assay (FDA approved for NASAL specimens in patients over 54 years of age), is one component of a comprehensive  surveillance program.  Test performance has been validated by [facility] for patients greater than or equal to 60 year old. It is not intended to diagnose infection nor to guide or monitor treatment.      Time coordinating discharge: Over 30 minutes  SIGNED:   Debbe Odea, MD  [facility] 09/29/2015, 3:10 PM Pager   If 7PM-7AM, please contact night-coverage [email or web address] Password [email or web address]

## 2015-09-29 NOTE — Care Management Note (Signed)
Case Management Note  Patient Details  Name: Rachel Morgan MRN: [id number] Date of Birth: 1950/02/10  Subjective/Objective:Spoke to patient's sister [name] in rm-d/c plan home w/ambulance transp. Has private duty care @ home.Ambulance transp needed @ d/c-PTAR forms in shadow chart-confirmed address. Nsg will cotnact PTAR when ready.No further CM needs.                    Action/Plan:d/c home.   Expected Discharge Date:   (unknown)               Expected Discharge Plan:  Home/Self Care  In-House Referral:  Clinical Social Work  Discharge planning Services  CM Consult  Post Acute Care Choice:  Resumption of Svcs/PTA Provider ([place] 5days/week/8hrs/day) Choice offered to:     DME Arranged:    DME Agency:     HH Arranged:    [place] Agency:     Status of Service:  Completed, signed off  If discussed at [employer], dates discussed:    Additional Comments:  Dessa Phi, RN 09/29/2015, 12:59 PM

## 2015-09-29 NOTE — Progress Notes (Signed)
Pt able to urinate 300ccs 4.5 hours after foley was D/C'd. PTAR called.   Vitals stable.  Sister signed D/C papers.  Additional D/C papers given to sister.  Assessment unchanged.  Scripts were given per MD order.  All questions pertaining to D/C we answered.  Pt was D/C'd via PTAR and accompanied by PTAR.  Rachel Morgan

## 2015-10-01 LAB — CULTURE, BLOOD (ROUTINE X 2)
CULTURE: NO GROWTH
Culture: NO GROWTH

## 2015-10-13 ENCOUNTER — Encounter (HOSPITAL_COMMUNITY): Payer: Self-pay | Admitting: *Deleted

## 2015-10-17 ENCOUNTER — Encounter (HOSPITAL_COMMUNITY): Payer: Self-pay | Admitting: Cardiology

## 2015-10-17 ENCOUNTER — Inpatient Hospital Stay (HOSPITAL_COMMUNITY)
Admission: EM | Admit: 2015-10-17 | Discharge: 2015-10-21 | DRG: 689 | Disposition: A | Discharge status: Home Health | Payer: Medicare Other | Attending: Internal Medicine | Admitting: Internal Medicine

## 2015-10-17 ENCOUNTER — Emergency Department (HOSPITAL_COMMUNITY): Payer: Medicare Other

## 2015-10-17 DIAGNOSIS — Z515 Encounter for palliative care: Secondary | ICD-10-CM | POA: Diagnosis present

## 2015-10-17 DIAGNOSIS — E039 Hypothyroidism, unspecified: Secondary | ICD-10-CM | POA: Diagnosis present

## 2015-10-17 DIAGNOSIS — E875 Hyperkalemia: Secondary | ICD-10-CM | POA: Diagnosis not present

## 2015-10-17 DIAGNOSIS — Z1612 Extended spectrum beta lactamase (ESBL) resistance: Secondary | ICD-10-CM | POA: Diagnosis present

## 2015-10-17 DIAGNOSIS — G9349 Other encephalopathy: Secondary | ICD-10-CM | POA: Diagnosis present

## 2015-10-17 DIAGNOSIS — I129 Hypertensive chronic kidney disease with stage 1 through stage 4 chronic kidney disease, or unspecified chronic kidney disease: Secondary | ICD-10-CM | POA: Diagnosis present

## 2015-10-17 DIAGNOSIS — B9620 Unspecified Escherichia coli [E. coli] as the cause of diseases classified elsewhere: Secondary | ICD-10-CM | POA: Diagnosis present

## 2015-10-17 DIAGNOSIS — G809 Cerebral palsy, unspecified: Secondary | ICD-10-CM | POA: Diagnosis present

## 2015-10-17 DIAGNOSIS — F418 Other specified anxiety disorders: Secondary | ICD-10-CM | POA: Diagnosis present

## 2015-10-17 DIAGNOSIS — Z888 Allergy status to other drugs, medicaments and biological substances status: Secondary | ICD-10-CM | POA: Diagnosis not present

## 2015-10-17 DIAGNOSIS — Z66 Do not resuscitate: Secondary | ICD-10-CM | POA: Diagnosis present

## 2015-10-17 DIAGNOSIS — N10 Acute tubulo-interstitial nephritis: Secondary | ICD-10-CM | POA: Diagnosis present

## 2015-10-17 DIAGNOSIS — Z87440 Personal history of urinary (tract) infections: Secondary | ICD-10-CM | POA: Diagnosis not present

## 2015-10-17 DIAGNOSIS — F79 Unspecified intellectual disabilities: Secondary | ICD-10-CM | POA: Diagnosis present

## 2015-10-17 DIAGNOSIS — F419 Anxiety disorder, unspecified: Secondary | ICD-10-CM | POA: Diagnosis present

## 2015-10-17 DIAGNOSIS — I10 Essential (primary) hypertension: Secondary | ICD-10-CM | POA: Diagnosis present

## 2015-10-17 DIAGNOSIS — W19XXXA Unspecified fall, initial encounter: Secondary | ICD-10-CM | POA: Diagnosis present

## 2015-10-17 DIAGNOSIS — N200 Calculus of kidney: Secondary | ICD-10-CM

## 2015-10-17 DIAGNOSIS — R40242 Glasgow coma scale score 9-12, unspecified time: Secondary | ICD-10-CM | POA: Diagnosis present

## 2015-10-17 DIAGNOSIS — Z7189 Other specified counseling: Secondary | ICD-10-CM | POA: Diagnosis not present

## 2015-10-17 DIAGNOSIS — N183 Chronic kidney disease, stage 3 unspecified: Secondary | ICD-10-CM | POA: Diagnosis present

## 2015-10-17 DIAGNOSIS — Z7982 Long term (current) use of aspirin: Secondary | ICD-10-CM | POA: Diagnosis not present

## 2015-10-17 DIAGNOSIS — Z880 Allergy status to penicillin: Secondary | ICD-10-CM

## 2015-10-17 DIAGNOSIS — N390 Urinary tract infection, site not specified: Secondary | ICD-10-CM

## 2015-10-17 DIAGNOSIS — M6281 Muscle weakness (generalized): Secondary | ICD-10-CM

## 2015-10-17 DIAGNOSIS — N179 Acute kidney failure, unspecified: Secondary | ICD-10-CM | POA: Diagnosis present

## 2015-10-17 DIAGNOSIS — Z79899 Other long term (current) drug therapy: Secondary | ICD-10-CM | POA: Diagnosis not present

## 2015-10-17 DIAGNOSIS — R4182 Altered mental status, unspecified: Secondary | ICD-10-CM | POA: Diagnosis present

## 2015-10-17 DIAGNOSIS — IMO0002 Reserved for concepts with insufficient information to code with codable children: Secondary | ICD-10-CM

## 2015-10-17 DIAGNOSIS — G9340 Encephalopathy, unspecified: Secondary | ICD-10-CM | POA: Diagnosis not present

## 2015-10-17 DIAGNOSIS — F329 Major depressive disorder, single episode, unspecified: Secondary | ICD-10-CM | POA: Diagnosis present

## 2015-10-17 DIAGNOSIS — Z6841 Body Mass Index (BMI) 40.0 and over, adult: Secondary | ICD-10-CM

## 2015-10-17 DIAGNOSIS — K5900 Constipation, unspecified: Secondary | ICD-10-CM | POA: Diagnosis present

## 2015-10-17 DIAGNOSIS — G808 Other cerebral palsy: Secondary | ICD-10-CM | POA: Diagnosis not present

## 2015-10-17 LAB — COMPREHENSIVE METABOLIC PANEL
ALBUMIN: 3.1 g/dL — AB (ref 3.5–5.0)
ALK PHOS: 79 U/L (ref 38–126)
ALT: 18 U/L (ref 14–54)
ANION GAP: 6 (ref 5–15)
AST: 28 U/L (ref 15–41)
BILIRUBIN TOTAL: 0.6 mg/dL (ref 0.3–1.2)
BUN: 70 mg/dL — AB (ref 6–20)
CALCIUM: 8.3 mg/dL — AB (ref 8.9–10.3)
CO2: 22 mmol/L (ref 22–32)
CREATININE: 4.15 mg/dL — AB (ref 0.44–1.00)
Chloride: 111 mmol/L (ref 101–111)
GFR calc Af Amer: 12 mL/min — ABNORMAL LOW (ref >60)
GFR calc non Af Amer: 11 mL/min — ABNORMAL LOW (ref >60)
GLUCOSE: 166 mg/dL — AB (ref 65–99)
Potassium: 5.1 mmol/L (ref 3.5–5.1)
Sodium: 139 mmol/L (ref 135–145)
TOTAL PROTEIN: 8.2 g/dL — AB (ref 6.5–8.1)

## 2015-10-17 LAB — URINALYSIS, ROUTINE W REFLEX MICROSCOPIC
Glucose, UA: NEGATIVE mg/dL
KETONES UR: NEGATIVE mg/dL
NITRITE: POSITIVE — AB
Protein, ur: >300 mg/dL — AB
SPECIFIC GRAVITY, URINE: 1.02 (ref 1.005–1.030)
pH: 6 (ref 5.0–8.0)

## 2015-10-17 LAB — URINE MICROSCOPIC-ADD ON

## 2015-10-17 LAB — CBG MONITORING, ED: GLUCOSE-CAPILLARY: 176 mg/dL — AB (ref 65–99)

## 2015-10-17 LAB — CBC
HEMATOCRIT: 36.6 % (ref 36.0–46.0)
HEMOGLOBIN: 12.3 g/dL (ref 12.0–15.0)
MCH: 30 pg (ref 26.0–34.0)
MCHC: 33.6 g/dL (ref 30.0–36.0)
MCV: 89.3 fL (ref 78.0–100.0)
Platelets: 231 10*3/uL (ref 150–400)
RBC: 4.1 MIL/uL (ref 3.87–5.11)
RDW: 14.1 % (ref 11.5–15.5)
WBC: 28.8 10*3/uL — ABNORMAL HIGH (ref 4.0–10.5)

## 2015-10-17 LAB — TSH: TSH: 3.259 u[IU]/mL (ref 0.350–4.500)

## 2015-10-17 LAB — LACTIC ACID, PLASMA
LACTIC ACID, VENOUS: 1.5 mmol/L (ref 0.5–1.9)
Lactic Acid, Venous: 1.2 mmol/L (ref 0.5–1.9)

## 2015-10-17 LAB — PROTIME-INR
INR: 1.28
Prothrombin Time: 16.1 seconds — ABNORMAL HIGH (ref 11.4–15.2)

## 2015-10-17 MED ORDER — PANTOPRAZOLE SODIUM 40 MG PO TBEC
40.0000 mg | DELAYED_RELEASE_TABLET | Freq: Every day | ORAL | Status: DC
  Administered 2015-10-18 – 2015-10-21 (×4): 40 mg via ORAL
  Filled 2015-10-17 (×5): qty 1

## 2015-10-17 MED ORDER — SODIUM CHLORIDE 0.9 % IV BOLUS (SEPSIS): 1000.0000 mL | Freq: Once | INTRAVENOUS | Status: DC

## 2015-10-17 MED ORDER — DEXTROSE 5 % IV SOLN
1.0000 g | Freq: Once | INTRAVENOUS | Status: AC
  Administered 2015-10-17: 1 g via INTRAVENOUS
  Filled 2015-10-17: qty 10

## 2015-10-17 MED ORDER — ONDANSETRON HCL 4 MG PO TABS: 4.0000 mg | ORAL_TABLET | Freq: Four times a day (QID) | ORAL | Status: DC | PRN

## 2015-10-17 MED ORDER — ZOLPIDEM TARTRATE 5 MG PO TABS
5.0000 mg | ORAL_TABLET | Freq: Every day | ORAL | Status: DC
  Administered 2015-10-17 – 2015-10-20 (×4): 5 mg via ORAL
  Filled 2015-10-17 (×4): qty 1

## 2015-10-17 MED ORDER — LEVOTHYROXINE SODIUM 50 MCG PO TABS
50.0000 ug | ORAL_TABLET | Freq: Every day | ORAL | Status: DC
  Administered 2015-10-18 – 2015-10-21 (×4): 50 ug via ORAL
  Filled 2015-10-17 (×4): qty 1

## 2015-10-17 MED ORDER — ACETAMINOPHEN 325 MG PO TABS: 650.0000 mg | ORAL_TABLET | Freq: Four times a day (QID) | ORAL | Status: DC | PRN

## 2015-10-17 MED ORDER — TRAMADOL HCL 50 MG PO TABS: 50.0000 mg | ORAL_TABLET | Freq: Four times a day (QID) | ORAL | Status: DC | PRN

## 2015-10-17 MED ORDER — SODIUM CHLORIDE 0.9 % IV SOLN: INTRAVENOUS | Status: DC

## 2015-10-17 MED ORDER — ALPRAZOLAM 1 MG PO TABS
1.0000 mg | ORAL_TABLET | Freq: Two times a day (BID) | ORAL | Status: DC
  Administered 2015-10-17 – 2015-10-21 (×8): 1 mg via ORAL
  Filled 2015-10-17 (×8): qty 1

## 2015-10-17 MED ORDER — LACTATED RINGERS IV SOLN
INTRAVENOUS | Status: DC
  Administered 2015-10-17 – 2015-10-19 (×3): via INTRAVENOUS

## 2015-10-17 MED ORDER — ENOXAPARIN SODIUM 30 MG/0.3ML ~~LOC~~ SOLN
30.0000 mg | SUBCUTANEOUS | Status: DC
  Administered 2015-10-17 – 2015-10-19 (×3): 30 mg via SUBCUTANEOUS
  Filled 2015-10-17 (×3): qty 0.3

## 2015-10-17 MED ORDER — DOCUSATE SODIUM 100 MG PO CAPS
100.0000 mg | ORAL_CAPSULE | Freq: Two times a day (BID) | ORAL | Status: DC
  Administered 2015-10-17 – 2015-10-21 (×8): 100 mg via ORAL
  Filled 2015-10-17 (×8): qty 1

## 2015-10-17 MED ORDER — DEXTROSE 5 % IV SOLN
1.0000 g | INTRAVENOUS | Status: DC
  Administered 2015-10-18: 1 g via INTRAVENOUS
  Filled 2015-10-17: qty 10

## 2015-10-17 MED ORDER — ONDANSETRON HCL 4 MG/2ML IJ SOLN: 4.0000 mg | Freq: Four times a day (QID) | INTRAMUSCULAR | Status: DC | PRN

## 2015-10-17 MED ORDER — ACETAMINOPHEN 650 MG RE SUPP: 650.0000 mg | Freq: Four times a day (QID) | RECTAL | Status: DC | PRN

## 2015-10-17 MED ORDER — ASPIRIN EC 81 MG PO TBEC
81.0000 mg | DELAYED_RELEASE_TABLET | Freq: Every morning | ORAL | Status: DC
  Administered 2015-10-18 – 2015-10-21 (×3): 81 mg via ORAL
  Filled 2015-10-17 (×4): qty 1

## 2015-10-17 MED ORDER — SODIUM CHLORIDE 0.9 % IV SOLN
INTRAVENOUS | Status: DC
  Administered 2015-10-17: 14:00:00 via INTRAVENOUS

## 2015-10-17 MED ORDER — OLANZAPINE 5 MG PO TABS
20.0000 mg | ORAL_TABLET | Freq: Every day | ORAL | Status: DC
  Administered 2015-10-18 – 2015-10-21 (×4): 20 mg via ORAL
  Filled 2015-10-17 (×5): qty 4

## 2015-10-17 MED ORDER — BUSPIRONE HCL 5 MG PO TABS
10.0000 mg | ORAL_TABLET | Freq: Every morning | ORAL | Status: DC
  Administered 2015-10-18 – 2015-10-21 (×4): 10 mg via ORAL
  Filled 2015-10-17 (×4): qty 2

## 2015-10-17 MED ORDER — CHLORPROMAZINE HCL 25 MG PO TABS
25.0000 mg | ORAL_TABLET | Freq: Two times a day (BID) | ORAL | Status: DC
  Administered 2015-10-17 – 2015-10-21 (×7): 25 mg via ORAL
  Filled 2015-10-17 (×13): qty 1

## 2015-10-17 MED ORDER — OXYBUTYNIN CHLORIDE ER 5 MG PO TB24
10.0000 mg | ORAL_TABLET | Freq: Every morning | ORAL | Status: DC
  Administered 2015-10-18 – 2015-10-21 (×4): 10 mg via ORAL
  Filled 2015-10-17 (×4): qty 2

## 2015-10-17 MED ORDER — ESCITALOPRAM OXALATE 10 MG PO TABS
20.0000 mg | ORAL_TABLET | Freq: Every day | ORAL | Status: DC
  Administered 2015-10-17 – 2015-10-20 (×4): 20 mg via ORAL
  Filled 2015-10-17 (×4): qty 2

## 2015-10-17 NOTE — ED Triage Notes (Signed)
Per family pt not as responsive as normal.  Recent UTI.

## 2015-10-17 NOTE — H&P (Signed)
History and Physical    Rachel Morgan KCL:[id number] DOB: 1950/06/07 DOA: 10/17/2015  PCP: [facility] Consultants:  Urology (at [facility]) Jeffie Pollock Patient coming from: home - lives with sister; Donald Prose: Sister, [phone]  Chief Complaint: AMS  HPI: Rachel Morgan is a 65 y.o. female with medical history significant of  cerebral palsy/intellectual disability as well as hypothyroidism, nephrolithiasis with recurrent UTIs, DM, and CKD.  The patient is currently alert but unable to effectively communicate.  Her sister reports that she has been fatigued, not eating.  She also fell yesterday.  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 Jeffie Pollock on 10/17.   The patient has been unable to communicate, staring off into space.  Normally able to watch TV, play with nephew, carry on conversation.  She has been complaining of RLQ/flank pain.  Her sister thinks she may have dysuria, possibly mild hematuria, and there is a bad odor to the urine.  Fever to 101 Saturday.   ED Course: Per Dr. Vanita Panda: Elderly female with cerebral palsy presents with altered mental status. Patient has history of recurrent urinary tract infection. Today the evaluation does demonstrate this, but also demonstrating acute kidney failure, with creatinine greater than 4. Patient is encephalopathic. Patient received fluid resuscitation, IV antibiotics, required admission for further evaluation and management.   Review of Systems: As per HPI; otherwise 10 point review of systems reviewed and negative.   Ambulatory Status:  Ambulates without assistance; currently requires assistnace with walking  Past Medical History:  Diagnosis Date  . Anxiety   . Calculus of gallbladder   . Cerebral palsy ([place])   . Chronic kidney disease   . Diabetes mellitus   . Gall stones   . GERD  (gastroesophageal reflux disease)   . High cholesterol   . Hypertension   . Hypothyroidism   . Kidney stones   . MR (mental retardation)   . Pyelonephritis   . Sepsis [facility])     Past Surgical History:  Procedure Laterality Date  . CYSTOSCOPY W/ URETERAL STENT PLACEMENT Bilateral 09/26/2015   Procedure: CYSTOSCOPY WITH Bilateral  RETROGRADE PYELOGRAM/URETERAL STENT PLACEMENT;  Surgeon: [name], MD;  Location: [facility] ORS;  Service: Urology;  Laterality: Bilateral;    Social History   Social History  . Marital status: Single    Spouse name: N/A  . Number of children: N/A  . Years of education: N/A   Occupational History  . Not on file.   Social History Main Topics  . Smoking status: Never Smoker  . Smokeless tobacco: Never Used  . Alcohol use No  . Drug use: No  . Sexual activity: No   Other Topics Concern  . Not on file   Social History Narrative  . No narrative on file    Allergies  Allergen Reactions  . Macrobid [Nitrofurantoin Monohyd Macro] Hives and Swelling  . Penicillins Swelling and Rash    Has patient had a PCN reaction causing immediate rash, facial/tongue/throat swelling, SOB or lightheadedness with hypotension: Yes Has patient had a PCN reaction causing severe rash involving mucus membranes or skin necrosis: Yes Has patient had a PCN reaction that required hospitalization: no Has patient had a PCN reaction occurring within the last 10 years: no If all of the above answers are "NO", then may proceed with Cephalosporin use.  Family History  Problem Relation Age of Onset  . Family history unknown: Yes    Prior to Admission medications   Medication Sig Start Date End Date Taking? Authorizing Provider  ALPRAZolam Duanne Moron) 1 MG tablet Take 1 mg by mouth 2 (two) times daily.    Yes Historical Provider, MD  aspirin EC 81 MG tablet Take 81 mg by mouth every morning.    Yes Historical Provider, MD  busPIRone (BUSPAR) 10 MG tablet Take 10 mg by mouth  every morning.    Yes Historical Provider, MD  chlorproMAZINE (THORAZINE) 25 MG tablet Take 25 mg by mouth 2 (two) times daily.    Yes Historical Provider, MD  escitalopram (LEXAPRO) 20 MG tablet Take 20 mg by mouth at bedtime.   Yes Historical Provider, MD  ibuprofen (ADVIL,MOTRIN) 200 MG tablet Take 400 mg by mouth every 6 (six) hours as needed for moderate pain.   Yes Historical Provider, MD  levothyroxine (SYNTHROID, LEVOTHROID) 50 MCG tablet Take 50 mcg by mouth daily before breakfast.   Yes Historical Provider, MD  OLANZapine (ZYPREXA) 20 MG tablet Take 20 mg by mouth daily.  07/24/15  Yes Historical Provider, MD  omeprazole (PRILOSEC) 20 MG capsule Take 20 mg by mouth every morning.    Yes Historical Provider, MD  oxybutynin (DITROPAN-XL) 10 MG 24 hr tablet Take 10 mg by mouth every morning.    Yes Historical Provider, MD  spironolactone (ALDACTONE) 50 MG tablet Take 50 mg by mouth 2 (two) times daily.   Yes Historical Provider, MD  sulfamethoxazole-trimethoprim (BACTRIM DS,SEPTRA DS) 800-160 MG tablet Take 1 tablet by mouth 2 (two) times daily. 10/16/15  Yes Historical Provider, MD  traMADol (ULTRAM) 50 MG tablet Take 50 mg by mouth every 6 (six) hours as needed for severe pain.  09/09/15  Yes Historical Provider, MD  zolpidem (AMBIEN) 10 MG tablet Take 10 mg by mouth at bedtime.   Yes Historical Provider, MD  ciprofloxacin (CIPRO) 500 MG tablet Take 1 tablet (500 mg total) by mouth 2 (two) times daily. Patient not taking: Reported on 10/17/2015 09/29/15   Debbe Odea, MD    Physical Exam: Vitals:   10/17/15 1547 10/17/15 1630 10/17/15 1635 10/17/15 1728  BP:  (!) 99/42 110/72 (!) 109/56  Pulse:  82 80 82  Resp:  (!) 27 20 20   Temp: 98.2 F (36.8 C)   98 F (36.7 C)  TempSrc: Oral   Oral  SpO2:  96% 98% 99%  Weight:    119.7 kg (264 lb)  Height:    5' (1.524 m)     General:  Appears calm and comfortable and is NAD; her only response to all questions is "yeah" Eyes:  PERRL, EOMI,  normal lids, iris ENT:  grossly normal hearing, lips & tongue, mmm Neck:  no LAD, masses or thyromegaly Cardiovascular:  RRR, no m/r/g. No LE edema.  Respiratory:  CTA bilaterally, no w/r/r. Normal respiratory effort. Abdomen:  soft, ntnd, NABS Skin:  no rash or induration seen on limited exam Musculoskeletal:  grossly normal tone BUE/BLE, good ROM, no bony abnormality Psychiatric:  Repetitive, nonsensical speech, no meaningful engagement Neurologic:  CN 2-12 grossly intact, moves all extremities in coordinated fashion, sensation intact  Labs on Admission: I have personally reviewed following labs and imaging studies  CBC:  Recent Labs Lab 10/17/15 1227  WBC 28.8*  HGB 12.3  HCT 36.6  MCV 89.3  PLT 867   Basic Metabolic Panel:  Recent Labs Lab 10/17/15 1227  NA 139  K 5.1  CL 111  CO2 22  GLUCOSE 166*  BUN 70*  CREATININE 4.15*  CALCIUM 8.3*   GFR: Estimated Creatinine Clearance: 16.5 mL/min (by C-G formula based on SCr of 4.15 mg/dL (H)). Liver Function Tests:  Recent Labs Lab 10/17/15 1227  AST 28  ALT 18  ALKPHOS 79  BILITOT 0.6  PROT 8.2*  ALBUMIN 3.1*   No results for input(s): LIPASE, AMYLASE in the last 168 hours. No results for input(s): AMMONIA in the last 168 hours. Coagulation Profile:  Recent Labs Lab 10/17/15 1321  INR 1.28   Cardiac Enzymes: No results for input(s): CKTOTAL, CKMB, CKMBINDEX, TROPONINI in the last 168 hours. BNP (last 3 results) No results for input(s): PROBNP in the last 8760 hours. HbA1C: No results for input(s): HGBA1C in the last 72 hours. CBG:  Recent Labs Lab 10/17/15 1216  GLUCAP 176*   Lipid Profile: No results for input(s): CHOL, HDL, LDLCALC, TRIG, CHOLHDL, LDLDIRECT in the last 72 hours. Thyroid Function Tests: No results for input(s): TSH, T4TOTAL, FREET4, T3FREE, THYROIDAB in the last 72 hours. Anemia Panel: No results for input(s): VITAMINB12, FOLATE, FERRITIN, TIBC, IRON, RETICCTPCT in the last  72 hours. Urine analysis:    Component Value Date/Time   COLORURINE BROWN (A) 10/17/2015 1355   APPEARANCEUR CLOUDY (A) 10/17/2015 1355   LABSPEC 1.020 10/17/2015 1355   PHURINE 6.0 10/17/2015 1355   GLUCOSEU NEGATIVE 10/17/2015 1355   HGBUR LARGE (A) 10/17/2015 1355   BILIRUBINUR MODERATE (A) 10/17/2015 1355   KETONESUR NEGATIVE 10/17/2015 1355   PROTEINUR >300 (A) 10/17/2015 1355   UROBILINOGEN 1.0 02/11/2011 2035   NITRITE POSITIVE (A) 10/17/2015 1355   LEUKOCYTESUR LARGE (A) 10/17/2015 1355    Creatinine Clearance: Estimated Creatinine Clearance: 16.5 mL/min (by C-G formula based on SCr of 4.15 mg/dL (H)).  Sepsis Labs: @LABRCNTIP (procalcitonin:4,lacticidven:4) )No results found for this or any previous visit (from the past 240 hour(s)).   Radiological Exams on Admission: Ct Head Wo Contrast  Result Date: 10/17/2015 CLINICAL DATA:  Altered mental status for 4 days. Decreased appetite, fever EXAM: CT HEAD WITHOUT CONTRAST TECHNIQUE: Contiguous axial images were obtained from the base of the skull through the vertex without intravenous contrast. COMPARISON:  09/26/2015 FINDINGS: Brain: Diffuse cerebral atrophy. Atrophy is more pronounced in the frontal lobes bilaterally further old infarcts with encephalomalacia. No hydrocephalus or acute infarction. No hemorrhage. Vascular: No hyperdense vessel or unexpected calcification. Skull: No acute calvarial abnormality Sinuses/Orbits: Visualized paranasal sinuses and mastoids clear. Orbital soft tissues unremarkable. Other: None IMPRESSION: Diffuse atrophy, most pronounced in the frontal lobes were there is encephalomalacia. No acute intracranial abnormality. Electronically Signed   By: Rolm Baptise M.D.   On: 10/17/2015 15:08   Dg Chest Port 1 View  Result Date: 10/17/2015 CLINICAL DATA:  Altered mental status EXAM: PORTABLE CHEST 1 VIEW COMPARISON:  09/26/2015 FINDINGS: The study is limited by patient's large body habitus and poor  inspiration. Cardiomegaly again noted. Left basilar atelectasis. No gross infiltrate or pulmonary edema. IMPRESSION: Limited study as described above. Cardiomegaly. No gross infiltrate or pulmonary edema. Left basilar atelectasis. Electronically Signed   By: Lahoma Crocker M.D.   On: 10/17/2015 13:37    EKG: Independently reviewed.  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 and so her baseline is not fully cognitively intact, but her sister describes a marked change from her usual baseline -Will rehydrate with IVF and provides antibiotics for UTI and continue to follow  UTI -Patient with reported c/o urinary symptoms as well as right-sided flank pain -Suspect right-sided pyelo, although the UTI could be lower tract disease as well -Patient d/w urology based on h/o UPJ stone (see below); they recommend admission to [facility] with plan for continued antibiotics until surgery on 10/17 -Prior abx culture reviewed and it was positive for pan-sensitive E coli -Will treat with Rocephin for now (initial dose given in ER) -If it is still pansensitive, patient should be appropriate for discharge with PO antibiotics once her current infection is improved -Of note, she did have a positive test for MRSA recently; while it would be unusual to have an MRSA UTI, if she is not improving this will need to be considered and antibiotics changed accordingly (would be reluctant to give Vanc in this setting unless truly needed) Her WBC count is  quite elevated but she does not have other SIRS criteria and is not currently demonstrating sepsis physiology  ARF on CKD -Baseline creatinine appears to be 1.2-1.3, currently 4.15 -Already received IVF in ER -Will continue aggressive IVF hydration and recheck creatinine in AM -She is making urine and so nephrology consultation is not needed at this time -Hold nephrotoxic agents including Motrin and diuretics  Nephrolithiasis -Has left proximal ureteral 14 mm stone with plan for left ureteroscopy by Dr. Jeffie Pollock at [facility] on 10/24/15. -Also has non-obstructing 6 mm right renal stone -JJ stents placed 9/19 due to urosepsis -If further evidence of urosepsis occurs during this admission, urology may need to intervene sooner than planned. -Currently, they are on board with admission to hospitalist service, IVF, antibiotics, and continued antibiotics through planned procedure.  Depression with anxiety -She takes a number of psychotropic medications -Will continue all home medications -No recent TSH in our system; will check and continue Synthroid at home dose for now  Goals of care -Discussion held with sister regarding her goals for the patient -Their parents died difficult deaths fairly recently (cancer, Alzheimer's) and the sister would not want to go through again -She has asked that the patient be DNR -We discussed palliative care and her long-term goals for the patient, and she is interested in a palliative care consult at this time.  DVT prophylaxis: Lovenox Code Status: DNR - confirmed with family Family Communication: Sister present throughout encounter Disposition Plan:  Home once clinically improved Consults called: Palliative care; Urology (by ER)  Admission status: Admit - It is my clinical opinion that admission to INPATIENT is reasonable and necessary because this patient will require at least 2 midnights in the hospital to treat this condition based on the medical complexity of  the problems presented.  Given the aforementioned information, the predictability of an adverse outcome is felt to be significant.     Karmen Bongo MD [facility]  If 7PM-7AM, please contact night-coverage [email or web address] Password [facility]  10/17/2015, 6:44 PM

## 2015-10-17 NOTE — ED Provider Notes (Addendum)
[facility] DEPT Provider Note   CSN: [id number] Arrival date & time: 10/17/15  1205  By signing my name below, I, Higinio Plan, attest that this documentation has been prepared under the direction and in the presence of Carmin Muskrat, MD . Electronically Signed: Higinio Plan, Scribe. 10/17/2015. 12:21 PM.  History   Chief Complaint Chief Complaint  Patient presents with  . Altered Mental Status   The history is provided by a relative. No language interpreter was used.   LEVEL 5 CAVEAT: HPI and ROS limited due to AMS  HPI Comments: Rachel Morgan is a 65 y.o. female with PMHx of DM and sepsis, who presents to the Emergency Department for an evaluation of AMS that began 4 days ago. Per sister, pt has "not been acting right" recently; she notes pt has had decreased appetite and fever (TMAX 101). She also states pt has been incontinent for the past few days and her last normal BM was 3 days ago. She notes pt does not walk independently anymore and had a fall yesterday. Pt's sister reports pt was recently admitted to [facility] on 9/19 and discharged with a UTI in which she had stents placed. Pt's sister believes pt may have another UTI.   Past Medical History:  Diagnosis Date  . Acute kidney injury ([place])   . Anxiety   . Calculus of gallbladder   . Cerebral palsy ([place])   . Chronic kidney disease   . Diabetes mellitus   . Gall stones   . GERD (gastroesophageal reflux disease)   . High cholesterol   . Hypertension   . Hypothyroidism   . Kidney stones   . Kidney stones   . MR (mental retardation)   . Pyelonephritis   . Sepsis ([place])   . Thyroid disease     Patient Active Problem List   Diagnosis Date Noted  . Nephrolithiasis 09/27/2015  . Cerebral palsy ([place]) 09/27/2015  . Sepsis ([place]) 09/26/2015  . AKI (acute kidney injury) ([place]) 09/26/2015  . Fever   . Urinary tract infectious disease   . Dilated cbd, acquired 09/14/2015  . Hypothyroidism 09/14/2015  . Hypertension  09/14/2015  . Depression with anxiety 09/14/2015  . UPJ obstruction, acquired 09/14/2015  . CKD (chronic kidney disease), stage III 09/14/2015  . GERD (gastroesophageal reflux disease) 09/14/2015  . Calculus of gallbladder without cholecystitis without obstruction   . Abdominal pain, right upper quadrant 02/11/2011  . Acute pyelonephritis 02/11/2011  . Agoraphobia 02/11/2011  . Acute renal failure ([place]) 02/11/2011  . Hyponatremia 02/11/2011  . Dehydration 02/11/2011    Past Surgical History:  Procedure Laterality Date  . CYSTOSCOPY W/ URETERAL STENT PLACEMENT Bilateral 09/26/2015   Procedure: CYSTOSCOPY WITH Bilateral  RETROGRADE PYELOGRAM/URETERAL STENT PLACEMENT;  Surgeon: [name], MD;  Location: [facility] ORS;  Service: Urology;  Laterality: Bilateral;    OB History    No data available       Home Medications    Prior to Admission medications   Medication Sig Start Date End Date Taking? Authorizing Provider  ALPRAZolam Duanne Moron) 1 MG tablet Take 1 mg by mouth 2 (two) times daily.     Historical Provider, MD  aspirin EC 81 MG tablet Take 81 mg by mouth every morning.     Historical Provider, MD  busPIRone (BUSPAR) 10 MG tablet Take 10 mg by mouth every morning.     Historical Provider, MD  chlorproMAZINE (THORAZINE) 25 MG tablet Take 25 mg by mouth 2 (two) times daily.  Historical Provider, MD  ciprofloxacin (CIPRO) 500 MG tablet Take 1 tablet (500 mg total) by mouth 2 (two) times daily. 09/29/15   Debbe Odea, MD  escitalopram (LEXAPRO) 20 MG tablet Take 20 mg by mouth at bedtime.    Historical Provider, MD  levothyroxine (SYNTHROID, LEVOTHROID) 50 MCG tablet Take 50 mcg by mouth daily before breakfast.    Historical Provider, MD  OLANZapine (ZYPREXA) 20 MG tablet Take 20 mg by mouth daily.  07/24/15   Historical Provider, MD  omeprazole (PRILOSEC) 20 MG capsule Take 20 mg by mouth every morning.     Historical Provider, MD  oxybutynin (DITROPAN-XL) 10 MG 24 hr tablet Take 10  mg by mouth every morning.     Historical Provider, MD  spironolactone (ALDACTONE) 50 MG tablet Take 50 mg by mouth 2 (two) times daily.    Historical Provider, MD  traMADol (ULTRAM) 50 MG tablet Take 50 mg by mouth every 6 (six) hours as needed for severe pain.  09/09/15   Historical Provider, MD  zolpidem (AMBIEN) 10 MG tablet Take 10 mg by mouth at bedtime.    Historical Provider, MD    Family History Family History  Problem Relation Age of Onset  . Family history unknown: Yes    Social History Social History  Substance Use Topics  . Smoking status: Never Smoker  . Smokeless tobacco: Never Used  . Alcohol use No     Allergies   Macrobid [nitrofurantoin monohyd macro] and Penicillins   Review of Systems Review of Systems  Unable to perform ROS: Mental status change   Physical Exam Updated Vital Signs BP 137/67 (BP Location: Left Arm)   Pulse 94   Temp 99.9 F (37.7 C) (Oral)   Resp 18   SpO2 95%   Physical Exam  Constitutional:  Minimally interactive obese elderly female  HENT:  Head: Normocephalic and atraumatic.  Eyes: Conjunctivae are normal.  Cardiovascular:  Tachycardia  Pulmonary/Chest:  Diminished breath sounds bilaterally  Abdominal: She exhibits no distension. There is no guarding.  Musculoskeletal: She exhibits no deformity.  Neurological:  GCS 9-10  Psychiatric: She is noncommunicative.  Nursing note and vitals reviewed.    ED Treatments / Results  Labs (all labs ordered are listed, but only abnormal results are displayed) Labs Reviewed  COMPREHENSIVE METABOLIC PANEL - Abnormal; Notable for the following:       Result Value   Glucose, Bld 166 (*)    BUN 70 (*)    Creatinine, Ser 4.15 (*)    Calcium 8.3 (*)    Total Protein 8.2 (*)    Albumin 3.1 (*)    GFR calc non Af Amer 11 (*)    GFR calc Af Amer 12 (*)    All other components within normal limits  CBC - Abnormal; Notable for the following:    WBC 28.8 (*)    All other components  within normal limits  PROTIME-INR - Abnormal; Notable for the following:    Prothrombin Time 16.1 (*)    All other components within normal limits  URINALYSIS, ROUTINE W REFLEX MICROSCOPIC (NOT AT [facility]) - Abnormal; Notable for the following:    Color, Urine BROWN (*)    APPearance CLOUDY (*)    Hgb urine dipstick LARGE (*)    Bilirubin Urine MODERATE (*)    Protein, ur >300 (*)    Nitrite POSITIVE (*)    Leukocytes, UA LARGE (*)    All other components within normal limits  URINE  MICROSCOPIC-ADD ON - Abnormal; Notable for the following:    Squamous Epithelial / LPF 6-30 (*)    Bacteria, UA MANY (*)    All other components within normal limits  CBG MONITORING, ED - Abnormal; Notable for the following:    Glucose-Capillary 176 (*)    All other components within normal limits  URINE CULTURE  LACTIC ACID, PLASMA  LACTIC ACID, PLASMA    EKG  EKG Interpretation  Date/Time:  Tuesday October 17 2015 13:21:01 EDT Ventricular Rate:  86 PR Interval:    QRS Duration: 94 QT Interval:  399 QTC Calculation: 478 R Axis:   43 Text Interpretation:  Sinus or ectopic atrial rhythm Atrial premature complex Short PR interval Low voltage, precordial leads T wave abnormality Abnormal ekg Confirmed by Carmin Muskrat  MD [phone]) on 10/17/2015 2:23:36 PM       Radiology Ct Head Wo Contrast  Result Date: 10/17/2015 CLINICAL DATA:  Altered mental status for 4 days. Decreased appetite, fever EXAM: CT HEAD WITHOUT CONTRAST TECHNIQUE: Contiguous axial images were obtained from the base of the skull through the vertex without intravenous contrast. COMPARISON:  09/26/2015 FINDINGS: Brain: Diffuse cerebral atrophy. Atrophy is more pronounced in the frontal lobes bilaterally further old infarcts with encephalomalacia. No hydrocephalus or acute infarction. No hemorrhage. Vascular: No hyperdense vessel or unexpected calcification. Skull: No acute calvarial abnormality Sinuses/Orbits: Visualized paranasal  sinuses and mastoids clear. Orbital soft tissues unremarkable. Other: None IMPRESSION: Diffuse atrophy, most pronounced in the frontal lobes were there is encephalomalacia. No acute intracranial abnormality. Electronically Signed   By: Rolm Baptise M.D.   On: 10/17/2015 15:08   Dg Chest Port 1 View  Result Date: 10/17/2015 CLINICAL DATA:  Altered mental status EXAM: PORTABLE CHEST 1 VIEW COMPARISON:  09/26/2015 FINDINGS: The study is limited by patient's large body habitus and poor inspiration. Cardiomegaly again noted. Left basilar atelectasis. No gross infiltrate or pulmonary edema. IMPRESSION: Limited study as described above. Cardiomegaly. No gross infiltrate or pulmonary edema. Left basilar atelectasis. Electronically Signed   By: Lahoma Crocker M.D.   On: 10/17/2015 13:37    Procedures Procedures (including critical care time)  Medications Ordered in ED Medications  0.9 %  sodium chloride infusion ( Intravenous New Bag/Given 10/17/15 1346)  cefTRIAXone (ROCEPHIN) 1 g in dextrose 5 % 50 mL IVPB (not administered)    DIAGNOSTIC STUDIES:  Oxygen Saturation is 95% on RA, normal by my interpretation.    COORDINATION OF CARE:  12:27 PM Discussed treatment plan with pt at bedside and pt agreed to plan.  EMR notable for discharge 2 weeks ago from affiliated facility, admission for sepsis, altered mental status.  Initial Impression / Assessment and Plan / ED Course  I have reviewed the triage vital signs and the nursing notes.  Pertinent labs & imaging results that were available during my care of the patient were reviewed by me and considered in my medical decision making (see chart for details).  Clinical Course  3:32 PM Sister aware of all results. On exam the patient remains in similar condition. Results for evidence for urinary tract infection, as well as acute kidney failure, with creatinine of 4, 400% increased in 2 weeks.   I personally performed the services described in this  documentation, which was scribed in my presence. The recorded information has been reviewed and is accurate.   Final Clinical Impressions(s) / ED Diagnoses   Elderly female with cerebral palsy presents with altered mental status (GCS 9-10). Patient has history  of recurrent urinary tract infection. Today the evaluation does demonstrate this, but also demonstrating acute kidney failure, with creatinine greater than 4. Patient is encephalopathicWith minimal interactivity, minimal spontaneous motion.  I began after discussion with the patient's family members about recurrent UTI, and likelihood of worsening effects each time. Patient received fluid resuscitation, IV antibiotics, required admission for further evaluation and management.    Carmin Muskrat, MD 10/17/15 1533  CRITICAL CARE Performed by: Carmin Muskrat Total critical care time: 45 minutes Critical care time was exclusive of separately billable procedures and treating other patients. Critical care was necessary to treat or prevent imminent or life-threatening deterioration. Critical care was time spent personally by me on the following activities: development of treatment plan with patient and/or surrogate as well as nursing, discussions with consultants, evaluation of patient's response to treatment, examination of patient, obtaining history from patient or surrogate, ordering and performing treatments and interventions, ordering and review of laboratory studies, ordering and review of radiographic studies, pulse oximetry and re-evaluation of patient's condition.    Carmin Muskrat, MD 10/17/15 1621    Carmin Muskrat, MD 11/08/15 2023

## 2015-10-17 NOTE — ED Triage Notes (Signed)
Per sister.  Pt recently discharge from [facility] with an UTI and had stents placed.  Has not been acting right since Saturday.  Home health Thursday and checked her urine and told her she had another UTI.  Started a new antibiodic yesterday.

## 2015-10-18 ENCOUNTER — Inpatient Hospital Stay (HOSPITAL_COMMUNITY): Payer: Medicare Other

## 2015-10-18 DIAGNOSIS — I10 Essential (primary) hypertension: Secondary | ICD-10-CM

## 2015-10-18 DIAGNOSIS — G808 Other cerebral palsy: Secondary | ICD-10-CM

## 2015-10-18 DIAGNOSIS — F418 Other specified anxiety disorders: Secondary | ICD-10-CM

## 2015-10-18 DIAGNOSIS — N200 Calculus of kidney: Secondary | ICD-10-CM

## 2015-10-18 LAB — BASIC METABOLIC PANEL
Anion gap: 7 (ref 5–15)
BUN: 65 mg/dL — ABNORMAL HIGH (ref 6–20)
CHLORIDE: 112 mmol/L — AB (ref 101–111)
CO2: 22 mmol/L (ref 22–32)
Calcium: 8.2 mg/dL — ABNORMAL LOW (ref 8.9–10.3)
Creatinine, Ser: 3.43 mg/dL — ABNORMAL HIGH (ref 0.44–1.00)
GFR, EST AFRICAN AMERICAN: 15 mL/min — AB (ref >60)
GFR, EST NON AFRICAN AMERICAN: 13 mL/min — AB (ref >60)
Glucose, Bld: 107 mg/dL — ABNORMAL HIGH (ref 65–99)
POTASSIUM: 4.9 mmol/L (ref 3.5–5.1)
SODIUM: 141 mmol/L (ref 135–145)

## 2015-10-18 LAB — CBC
HEMATOCRIT: 33.1 % — AB (ref 36.0–46.0)
HEMOGLOBIN: 10.6 g/dL — AB (ref 12.0–15.0)
MCH: 28.7 pg (ref 26.0–34.0)
MCHC: 32 g/dL (ref 30.0–36.0)
MCV: 89.7 fL (ref 78.0–100.0)
Platelets: 221 10*3/uL (ref 150–400)
RBC: 3.69 MIL/uL — AB (ref 3.87–5.11)
RDW: 14.1 % (ref 11.5–15.5)
WBC: 18.5 10*3/uL — AB (ref 4.0–10.5)

## 2015-10-18 MED ORDER — GLUCERNA SHAKE PO LIQD
237.0000 mL | Freq: Three times a day (TID) | ORAL | Status: DC
  Administered 2015-10-19 – 2015-10-21 (×6): 237 mL via ORAL

## 2015-10-18 NOTE — Care Management Note (Signed)
Case Management Note  Patient Details  Name: Rachel Morgan MRN: [id number] Date of Birth: 1950/10/16  Subjective/Objective:                  Pt admitted with encephalopathy. She is from home, she lives with her sister. Sister is at bedside to provide info as pt is unable to do so. Per sister pt is ind with ADL's. She uses no assistive devices with mobility. She has a BSC she uses as needed. She is not active with [facility] services and has never had [facility] services. She has an aid that comes 5 days a week for 8hrs a day. She has PCP, transportation and no difficulty affording her medications. Anticipate pt will return home, may need HH depending on progression of encephalopathy.   Action/Plan: Will cont to follow.   Expected Discharge Date:       10/22/2015           Expected Discharge Plan:  Home/Self Care  In-House Referral:  NA  Discharge planning Services  CM Consult  Post Acute Care Choice:  NA Choice offered to:  NA  DME Arranged:    DME Agency:     HH Arranged:    HH Agency:     Status of Service:  In process, will continue to follow   Sherald Barge, RN 10/18/2015, 3:21 PM

## 2015-10-18 NOTE — Progress Notes (Signed)
Pharmacy Antibiotic Note  Rachel Morgan is a 65 y.o. female admitted on 10/17/2015 with UTI.  Pharmacy has been consulted for ROCEPHIN dosing.  Plan: Rocephin 1gm IV q24hrs Monitor labs, progress, c/s  Height: 5' (152.4 cm) Weight: 264 lb (119.7 kg) IBW/kg (Calculated) : 45.5  Temp (24hrs), Avg:99.2 F (37.3 C), Min:98 F (36.7 C), Max:100.9 F (38.3 C)   Recent Labs Lab 10/17/15 1227 10/17/15 1321 10/17/15 1637 10/18/15 0553  WBC 28.8*  --   --  18.5*  CREATININE 4.15*  --   --  3.43*  LATICACIDVEN  --  1.5 1.2  --     Estimated Creatinine Clearance: 19.9 mL/min (by C-G formula based on SCr of 3.43 mg/dL (H)).    Allergies  Allergen Reactions  . Macrobid [Nitrofurantoin Monohyd Macro] Hives and Swelling  . Penicillins Swelling and Rash    Has patient had a PCN reaction causing immediate rash, facial/tongue/throat swelling, SOB or lightheadedness with hypotension: Yes Has patient had a PCN reaction causing severe rash involving mucus membranes or skin necrosis: Yes Has patient had a PCN reaction that required hospitalization: no Has patient had a PCN reaction occurring within the last 10 years: no If all of the above answers are "NO", then may proceed with Cephalosporin use.    Antimicrobials this admission: Rocephin 10/10 >>   Dose adjustments this admission:  Microbiology results:  BCx:   UCx: pending  Sputum:   MRSA PCR:  Thank you for allowing pharmacy to be a part of this patient's care.  [name] A 10/18/2015 11:58 AM

## 2015-10-18 NOTE — Progress Notes (Signed)
Initial Nutrition Assessment  DOCUMENTATION CODES:  Morbid obesity  INTERVENTION:  Until patient's affinity for food returns, Glucerna Shake po TID at meals, each supplement provides 220 kcal and 10 grams of protein  NUTRITION DIAGNOSIS:  Inadequate oral intake related to lethargy/confusion, poor appetite, acute illness as evidenced by per patient/family report of pt eating almost nothing the past 5 days.   GOAL:  Patient will meet greater than or equal to 90% of their needs  MONITOR:  PO intake, Supplement acceptance, I & O's, Labs  REASON FOR ASSESSMENT:  Malnutrition Screening Tool    ASSESSMENT:  65 y/o female PMHx Cerebral Palsy, Intellectual disability, nephrolithiasis with recurrent UTI, DM, CKD, HTN, GERD, anxiety. Presents with fatigue, poor PO intake, fall and encephalopathy. Worked up for UTI and ARF on CKD.   Multiple family members are present. Pt is nonverbal at this time. Family reports that starting this past weekend, pt's appetite and functional ability quickly declined. They state normally patient is able to walk around, communicate with others and only requires assistance with feeding and getting dressed.    Family denies pt having any diarrhea, nausea, vomiting. She is constipated. She also has been noted to be "gagging" on her eggs this morning.   Sister notes that while patient is not eating much of her meals, she is drinking very well. She reports the pt quickly drank a glucerna for breakfast today and was drinking a large quantity of water at home these past few days. Sister of pt was agreeable to continues glucerna supplements until pt's po intake improves.   A different family member noted that pt likes fish sandwiches, but unfortunately these are not available right now.   Nfpe: Morbidly obese  Labs: WBC:18.5, ALBUMIN: 3.1,  Medications: ABx, colace, ppi, multiple antipsychotics    Recent Labs Lab 10/17/15 1227 10/18/15 0553  NA 139 141  K 5.1 4.9   CL 111 112*  CO2 22 22  BUN 70* 65*  CREATININE 4.15* 3.43*  CALCIUM 8.3* 8.2*  GLUCOSE 166* 107*   Diet Order:  Diet regular Room service appropriate? Yes; Fluid consistency: Thin  Skin:  Reviewed, no issues  Last BM:  10/7  Height:  Ht Readings from Last 1 Encounters:  10/17/15 5' (1.524 m)   Weight:  Wt Readings from Last 1 Encounters:  10/17/15 264 lb (119.7 kg)   Wt Readings from Last 10 Encounters:  10/17/15 264 lb (119.7 kg)  09/28/15 267 lb 10.2 oz (121.4 kg)  09/17/15 264 lb 8.8 oz (120 kg)  02/14/11 (!) 314 lb 13.1 oz (142.8 kg)   Ideal Body Weight:  45.45 kg  BMI:  Body mass index is 51.56 kg/m.  Estimated Nutritional Needs:  Kcal:  1400-1600 kcals Protein:  55-65 g Pro Fluid:  1.4-1.6 Liters fluid  EDUCATION NEEDS:  No education needs identified at this time  Burtis Junes RD, LDN, [place] [facility] Pager: [phone] 10/18/2015 3:54 PM

## 2015-10-18 NOTE — Progress Notes (Signed)
PROGRESS NOTE  Rachel Morgan  GQQ:[id number] DOB: November 04, 1950 DOA: 10/17/2015 PCP: Rachel Morgan Outpatient Specialists:  Subjective: Seen with her sister Rachel Morgan at bedside, appears to be doing better. Had low-grade fever of 100.9 this morning, urine culture is growing Escherichia coli.  Brief Narrative:  Rachel Morgan is a 65 y.o. female with medical history significant of  cerebral palsy/intellectual disability as well as hypothyroidism, nephrolithiasis with recurrent UTIs, DM, and CKD.  The patient is currently alert but unable to effectively communicate.  Her sister reports that she has been fatigued, not eating.  She also fell yesterday.  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 Jeffie Morgan on 10/17.   The patient has been unable to communicate, staring off into space.  Normally able to watch TV, play with nephew, carry on conversation.  She has been complaining of RLQ/flank pain.  Her sister thinks she may have dysuria, possibly mild hematuria, and there is a bad odor to the urine.  Fever to 101 Saturday.  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 She is back to her baseline.  UTI/acute right-sided pyelonephritis -Patient with reported  c/o urinary symptoms as well as right-sided flank pain -Suspect right-sided pyelo, although the UTI could be lower tract disease as well. -Urine growing Escherichia coli, is on Rocephin continued. -Renal ultrasound did not show convincing hydronephrosis. Continue antibiotics.  ARF on CKD -Baseline creatinine appears to be 1.2-1.3, admitted with creatinine of 4.15. -Hold nephrotoxic agents including Motrin and diuretics -Check renal ultrasound to rule out hydronephrosis. Has bilateral ureteral stents. -This is improving, creatinine is 3.4 today. No obstruction per ultrasound.  Nephrolithiasis -Has left proximal ureteral 14 mm stone with plan for left ureteroscopy by Dr. Jeffie Morgan at [facility] on 10/24/15. -Also has non-obstructing 6 mm right renal stone -JJ stents placed 9/19 due to urosepsis -If further evidence of urosepsis occurs during this admission, urology may need to intervene sooner than planned. -Currently, they are on board with admission to hospitalist service, IVF, antibiotics, and continued antibiotics through planned procedure.  Depression with anxiety -She takes a number of psychotropic medications -Will continue all home medications -No recent TSH in our system; will check and continue Synthroid at home dose for now  Goals of care -Discussion held with sister regarding her goals for the patient -Their parents died difficult deaths fairly recently (cancer, Alzheimer's) and the sister would not want to go through again -She has asked that the patient be DNR -We discussed palliative care and her long-term goals for the patient, and she is interested in a palliative care consult at this time.    DVT prophylaxis: Lovenox Code Status: DNR Family Communication:  Disposition Plan:  Diet: Diet regular Room service appropriate? Yes; Fluid consistency: Thin  Consultants:   None  Procedures:   None  Antimicrobials:   Rocephin  Objective: Vitals:   10/17/15 1635  10/17/15 1728 10/17/15 2142 10/18/15 0656  BP: 110/72 (!) 109/56 (!) 123/54 (!) 112/55  Pulse: 80 82 85 85  Resp: 20 20 20 20   Temp:  98 F (36.7 C) 98.8 F (37.1 C) (!) 100.9 F (38.3 C)  TempSrc:  Oral Oral Oral  SpO2: 98% 99% 98% 96%  Weight:  119.7 kg (264 lb)    Height:  5' (1.524 m)      Intake/Output Summary (Last 24 hours) at 10/18/15 1102 Last data filed at 10/18/15 0300  Gross per 24 hour  Intake           835.83 ml  Output                0 ml  Net           835.83 ml   Filed Weights   10/17/15 1728  Weight: 119.7 kg (264 lb)    Examination: General exam: Appears calm and comfortable  Respiratory system: Clear to auscultation. Respiratory effort normal. Cardiovascular system: S1 & S2 heard, RRR. No JVD, murmurs, rubs, gallops or clicks. No pedal edema. Gastrointestinal system: Abdomen is nondistended, soft and nontender. No organomegaly or masses felt. Normal bowel sounds heard. Central nervous system: Alert and oriented. No focal neurological deficits. Extremities: Symmetric 5 x 5 power. Skin: No rashes, lesions or ulcers Psychiatry: Judgement and insight appear normal. Mood & affect appropriate.   Data Reviewed: I have personally reviewed following labs and imaging studies  CBC:  Recent Labs Lab 10/17/15 1227 10/18/15 0553  WBC 28.8* 18.5*  HGB 12.3 10.6*  HCT 36.6 33.1*  MCV 89.3 89.7  PLT 231 786   Basic Metabolic Panel:  Recent Labs Lab 10/17/15 1227 10/18/15 0553  NA 139 141  K 5.1 4.9  CL 111 112*  CO2 22 22  GLUCOSE 166* 107*  BUN 70* 65*  CREATININE 4.15* 3.43*  CALCIUM 8.3* 8.2*   GFR: Estimated Creatinine Clearance: 19.9 mL/min (by C-G formula based on SCr of 3.43 mg/dL (H)). Liver Function Tests:  Recent Labs Lab 10/17/15 1227  AST 28  ALT 18  ALKPHOS 79  BILITOT 0.6  PROT 8.2*  ALBUMIN 3.1*   No results for input(s): LIPASE, AMYLASE in the last 168 hours. No results for input(s): AMMONIA in the last 168  hours. Coagulation Profile:  Recent Labs Lab 10/17/15 1321  INR 1.28   Cardiac Enzymes: No results for input(s): CKTOTAL, CKMB, CKMBINDEX, TROPONINI in the last 168 hours. BNP (last 3 results) No results for input(s): PROBNP in the last 8760 hours. HbA1C: No results for input(s): HGBA1C in the last 72 hours. CBG:  Recent Labs Lab 10/17/15 1216  GLUCAP 176*   Lipid Profile: No results for input(s): CHOL, HDL, LDLCALC, TRIG, CHOLHDL, LDLDIRECT in the last 72 hours. Thyroid Function Tests:  Recent Labs  10/17/15 1637  TSH 3.259   Anemia Panel: No results for input(s): VITAMINB12, FOLATE, FERRITIN, TIBC, IRON, RETICCTPCT in the last 72 hours. Urine analysis:    Component Value Date/Time   COLORURINE BROWN (A) 10/17/2015 1355   APPEARANCEUR CLOUDY (A) 10/17/2015 1355   LABSPEC 1.020 10/17/2015 1355   PHURINE 6.0 10/17/2015 1355   GLUCOSEU NEGATIVE 10/17/2015 1355   HGBUR LARGE (A) 10/17/2015 1355   BILIRUBINUR MODERATE (A) 10/17/2015 1355   KETONESUR NEGATIVE 10/17/2015 1355   PROTEINUR >300 (A) 10/17/2015 1355  UROBILINOGEN 1.0 02/11/2011 2035   NITRITE POSITIVE (A) 10/17/2015 1355   LEUKOCYTESUR LARGE (A) 10/17/2015 1355   Sepsis Labs: @LABRCNTIP (procalcitonin:4,lacticidven:4)  ) Recent Results (from the past 240 hour(s))  Urine culture     Status: Abnormal (Preliminary result)   Collection Time: 10/17/15  1:55 PM  Result Value Ref Range Status   Specimen Description URINE, CATHETERIZED  Final   Special Requests NONE  Final   Culture 10,000 COLONIES/mL ESCHERICHIA COLI (A)  Final   Report Status PENDING  Incomplete     Invalid input(s): PROCALCITONIN, LACTICACIDVEN   Radiology Studies: Ct Head Wo Contrast  Result Date: 10/17/2015 CLINICAL DATA:  Altered mental status for 4 days. Decreased appetite, fever EXAM: CT HEAD WITHOUT CONTRAST TECHNIQUE: Contiguous axial images were obtained from the base of the skull through the vertex without intravenous  contrast. COMPARISON:  09/26/2015 FINDINGS: Brain: Diffuse cerebral atrophy. Atrophy is more pronounced in the frontal lobes bilaterally further old infarcts with encephalomalacia. No hydrocephalus or acute infarction. No hemorrhage. Vascular: No hyperdense vessel or unexpected calcification. Skull: No acute calvarial abnormality Sinuses/Orbits: Visualized paranasal sinuses and mastoids clear. Orbital soft tissues unremarkable. Other: None IMPRESSION: Diffuse atrophy, most pronounced in the frontal lobes were there is encephalomalacia. No acute intracranial abnormality. Electronically Signed   By: Rolm Baptise M.D.   On: 10/17/2015 15:08   Dg Chest Port 1 View  Result Date: 10/17/2015 CLINICAL DATA:  Altered mental status EXAM: PORTABLE CHEST 1 VIEW COMPARISON:  09/26/2015 FINDINGS: The study is limited by patient's large body habitus and poor inspiration. Cardiomegaly again noted. Left basilar atelectasis. No gross infiltrate or pulmonary edema. IMPRESSION: Limited study as described above. Cardiomegaly. No gross infiltrate or pulmonary edema. Left basilar atelectasis. Electronically Signed   By: Lahoma Crocker M.D.   On: 10/17/2015 13:37        Scheduled Meds: . ALPRAZolam  1 mg Oral BID  . aspirin EC  81 mg Oral q morning - 10a  . busPIRone  10 mg Oral q morning - 10a  . cefTRIAXone (ROCEPHIN)  IV  1 g Intravenous Q24H  . chlorproMAZINE  25 mg Oral BID  . docusate sodium  100 mg Oral BID  . enoxaparin (LOVENOX) injection  30 mg Subcutaneous Q24H  . escitalopram  20 mg Oral QHS  . levothyroxine  50 mcg Oral QAC breakfast  . OLANZapine  20 mg Oral Daily  . oxybutynin  10 mg Oral q morning - 10a  . pantoprazole  40 mg Oral Daily  . zolpidem  5 mg Oral QHS   Continuous Infusions: . lactated ringers 125 mL/hr at 10/17/15 2214     LOS: 1 day    Time spent: 35 minutes    Harol Shabazz A, MD [facility] Pager [phone]  If 7PM-7AM, please contact  night-coverage [email or web address] Password [email or web address] 10/18/2015, 11:02 AM

## 2015-10-19 ENCOUNTER — Encounter (HOSPITAL_COMMUNITY): Payer: Self-pay | Admitting: Primary Care

## 2015-10-19 DIAGNOSIS — G9340 Encephalopathy, unspecified: Secondary | ICD-10-CM

## 2015-10-19 DIAGNOSIS — Z515 Encounter for palliative care: Secondary | ICD-10-CM

## 2015-10-19 DIAGNOSIS — N183 Chronic kidney disease, stage 3 (moderate): Secondary | ICD-10-CM

## 2015-10-19 DIAGNOSIS — Z7189 Other specified counseling: Secondary | ICD-10-CM

## 2015-10-19 DIAGNOSIS — N10 Acute tubulo-interstitial nephritis: Principal | ICD-10-CM

## 2015-10-19 LAB — BASIC METABOLIC PANEL
ANION GAP: 6 (ref 5–15)
BUN: 43 mg/dL — ABNORMAL HIGH (ref 6–20)
CHLORIDE: 107 mmol/L (ref 101–111)
CO2: 22 mmol/L (ref 22–32)
Calcium: 8.2 mg/dL — ABNORMAL LOW (ref 8.9–10.3)
Creatinine, Ser: 2.35 mg/dL — ABNORMAL HIGH (ref 0.44–1.00)
GFR calc non Af Amer: 21 mL/min — ABNORMAL LOW (ref >60)
GFR, EST AFRICAN AMERICAN: 24 mL/min — AB (ref >60)
Glucose, Bld: 148 mg/dL — ABNORMAL HIGH (ref 65–99)
Potassium: 5 mmol/L (ref 3.5–5.1)
Sodium: 135 mmol/L (ref 135–145)

## 2015-10-19 LAB — URINE CULTURE

## 2015-10-19 MED ORDER — SODIUM CHLORIDE 0.9 % IV SOLN
INTRAVENOUS | Status: DC
  Administered 2015-10-19 – 2015-10-21 (×4): via INTRAVENOUS

## 2015-10-19 MED ORDER — SODIUM CHLORIDE 0.9 % IV SOLN
500.0000 mg | Freq: Two times a day (BID) | INTRAVENOUS | Status: DC
  Administered 2015-10-19 – 2015-10-20 (×3): 500 mg via INTRAVENOUS
  Filled 2015-10-19 (×9): qty 500

## 2015-10-19 NOTE — Progress Notes (Signed)
PROGRESS NOTE  Rachel Morgan  MVH:[id number] DOB: 1950-03-17 DOA: 10/17/2015 PCP: [facility] Outpatient Specialists:  Subjective: Seen with her sister [name] at bedside, smiling but nonverbal.  Brief Narrative:  Rachel Morgan is a 65 y.o. female with medical history significant of  cerebral palsy/intellectual disability as well as hypothyroidism, nephrolithiasis with recurrent UTIs, DM, and CKD.  The patient is currently alert but unable to effectively communicate.  Her sister reports that she has been fatigued, not eating.  She also fell yesterday.  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 Jeffie Pollock on 10/17.   The patient has been unable to communicate, staring off into space.  Normally able to watch TV, play with nephew, carry on conversation.  She has been complaining of RLQ/flank pain.  Her sister thinks she may have dysuria, possibly mild hematuria, and there is a bad odor to the urine.  Fever to 101 Saturday.  Assessment & Plan:   Principal Problem:   Encephalopathy Active Problems:   Acute pyelonephritis   Acute renal failure (HCC)   Hypertension   Depression with anxiety   CKD (chronic kidney disease), stage III   Nephrolithiasis   Cerebral palsy (HCC)   Goals of care, counseling/discussion   ESBL Escherichia coli UTI/acute right-sided pyelonephritis -Patient with reported c/o urinary symptoms as well as right-sided flank pain -Suspect right-sided pyelo, although the UTI could be lower tract disease as well. -Renal ultrasound did not show convincing hydronephrosis.  -Initially was on Rocephin, urine grew Escherichia coli which identified as ESBL. Started on Primaxin.  ARF on CKD -Baseline creatinine appears to be 1.2-1.3, admitted with creatinine of 4.15. -Hold nephrotoxic agents including Motrin and  diuretics -Check renal ultrasound to rule out hydronephrosis. Has bilateral ureteral stents, no obstruction per ultrasound. -Creatinine is 2.35 today. 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  Nephrolithiasis -Has left proximal ureteral 14 mm stone with plan for left ureteroscopy by Dr. Jeffie Pollock at [facility] on 10/24/15. -Also has non-obstructing 6 mm right renal stone -JJ stents placed 9/19 due to urosepsis -If further evidence of urosepsis occurs during this admission, urology may need to intervene sooner than planned. -Currently, they are on board with admission to hospitalist service, IVF, antibiotics, and continued antibiotics through planned procedure.  Depression with anxiety -She takes a number of psychotropic medications -Will continue all home medications -No recent TSH in our system; will check and continue Synthroid at home dose for now  Goals of care -Discussion held with sister regarding her goals for the patient -Their parents died difficult deaths fairly recently (cancer, Alzheimer's) and the sister would not want to go through again -She has asked that the patient be DNR -Palliative medicine team consulted for goals of care.   DVT prophylaxis: Lovenox Code Status: DNR Family Communication:  Disposition Plan:  Diet: Diet regular Room service appropriate? Yes; Fluid consistency: Thin  Consultants:   None  Procedures:   None  Antimicrobials:   Rocephin  Objective: Vitals:   10/18/15 0656 10/18/15 1429 10/18/15 2140 10/19/15 0553  BP: (!) 112/55 Marland Kitchen)  120/58 (!) 132/56 118/60  Pulse: 85 74 72 77  Resp: 20 20 20 20   Temp: (!) 100.9 F (38.3 C) 99 F (37.2 C)  97.7 F (36.5 C) 98 F (36.7 C)  TempSrc: Oral Oral Oral Oral  SpO2: 96% 100% 98% 100%  Weight:    119.6 kg (263 lb 11.4 oz)  Height:       No intake or output data in the 24 hours ending 10/19/15 1104 Filed Weights   10/17/15 1728 10/19/15 0553  Weight: 119.7 kg (264 lb) 119.6 kg (263 lb 11.4 oz)    Examination: General exam: Appears calm and comfortable  Respiratory system: Clear to auscultation. Respiratory effort normal. Cardiovascular system: S1 & S2 heard, RRR. No JVD, murmurs, rubs, gallops or clicks. No pedal edema. Gastrointestinal system: Abdomen is nondistended, soft and nontender. No organomegaly or masses felt. Normal bowel sounds heard. Central nervous system: Alert and oriented. No focal neurological deficits. Extremities: Symmetric 5 x 5 power. Skin: No rashes, lesions or ulcers Psychiatry: Judgement and insight appear normal. Mood & affect appropriate.   Data Reviewed: I have personally reviewed following labs and imaging studies  CBC:  Recent Labs Lab 10/17/15 1227 10/18/15 0553  WBC 28.8* 18.5*  HGB 12.3 10.6*  HCT 36.6 33.1*  MCV 89.3 89.7  PLT 231 017   Basic Metabolic Panel:  Recent Labs Lab 10/17/15 1227 10/18/15 0553 10/19/15 0907  NA 139 141 135  K 5.1 4.9 5.0  CL 111 112* 107  CO2 22 22 22   GLUCOSE 166* 107* 148*  BUN 70* 65* 43*  CREATININE 4.15* 3.43* 2.35*  CALCIUM 8.3* 8.2* 8.2*   GFR: Estimated Creatinine Clearance: 29.1 mL/min (by C-G formula based on SCr of 2.35 mg/dL (H)). Liver Function Tests:  Recent Labs Lab 10/17/15 1227  AST 28  ALT 18  ALKPHOS 79  BILITOT 0.6  PROT 8.2*  ALBUMIN 3.1*   No results for input(s): LIPASE, AMYLASE in the last 168 hours. No results for input(s): AMMONIA in the last 168 hours. Coagulation Profile:  Recent Labs Lab 10/17/15 1321  INR 1.28   Cardiac Enzymes: No results for input(s): CKTOTAL, CKMB, CKMBINDEX, TROPONINI in the last 168 hours. BNP (last 3 results) No results  for input(s): PROBNP in the last 8760 hours. HbA1C: No results for input(s): HGBA1C in the last 72 hours. CBG:  Recent Labs Lab 10/17/15 1216  GLUCAP 176*   Lipid Profile: No results for input(s): CHOL, HDL, LDLCALC, TRIG, CHOLHDL, LDLDIRECT in the last 72 hours. Thyroid Function Tests:  Recent Labs  10/17/15 1637  TSH 3.259   Anemia Panel: No results for input(s): VITAMINB12, FOLATE, FERRITIN, TIBC, IRON, RETICCTPCT in the last 72 hours. Urine analysis:    Component Value Date/Time   COLORURINE BROWN (A) 10/17/2015 1355   APPEARANCEUR CLOUDY (A) 10/17/2015 1355   LABSPEC 1.020 10/17/2015 1355   PHURINE 6.0 10/17/2015 1355   GLUCOSEU NEGATIVE 10/17/2015 1355   HGBUR LARGE (A) 10/17/2015 1355   BILIRUBINUR MODERATE (A) 10/17/2015 [place] 10/17/2015 1355   PROTEINUR >300 (A) 10/17/2015 1355   UROBILINOGEN 1.0 02/11/2011 2035   NITRITE POSITIVE (A) 10/17/2015 1355   LEUKOCYTESUR LARGE (A) 10/17/2015 1355   Sepsis Labs: @LABRCNTIP (procalcitonin:4,lacticidven:4)  ) Recent Results (from the past 240 hour(s))  Urine culture     Status: Abnormal   Collection Time: 10/17/15  1:55 PM  Result Value Ref Range Status   Specimen Description URINE, CATHETERIZED  Final   Special Requests  NONE  Final   Culture (A)  Final    10,000 COLONIES/mL ESCHERICHIA COLI Confirmed Extended Spectrum Beta-Lactamase Producer (ESBL) Performed at [facility]    Report Status 10/19/2015 FINAL  Final   Organism ID, Bacteria ESCHERICHIA COLI (A)  Final      Susceptibility   Escherichia coli - MIC*    AMPICILLIN >=32 RESISTANT Resistant     CEFAZOLIN >=64 RESISTANT Resistant     CEFTRIAXONE >=64 RESISTANT Resistant     CIPROFLOXACIN >=4 RESISTANT Resistant     GENTAMICIN >=16 RESISTANT Resistant     IMIPENEM <=0.25 SENSITIVE Sensitive     NITROFURANTOIN <=16 SENSITIVE Sensitive     TRIMETH/SULFA <=20 SENSITIVE Sensitive     AMPICILLIN/SULBACTAM >=32 RESISTANT  Resistant     PIP/TAZO <=4 SENSITIVE Sensitive     Extended ESBL POSITIVE Resistant     * 10,000 COLONIES/mL ESCHERICHIA COLI     Invalid input(s): PROCALCITONIN, LACTICACIDVEN   Radiology Studies: Ct Head Wo Contrast  Result Date: 10/17/2015 CLINICAL DATA:  Altered mental status for 4 days. Decreased appetite, fever EXAM: CT HEAD WITHOUT CONTRAST TECHNIQUE: Contiguous axial images were obtained from the base of the skull through the vertex without intravenous contrast. COMPARISON:  09/26/2015 FINDINGS: Brain: Diffuse cerebral atrophy. Atrophy is more pronounced in the frontal lobes bilaterally further old infarcts with encephalomalacia. No hydrocephalus or acute infarction. No hemorrhage. Vascular: No hyperdense vessel or unexpected calcification. Skull: No acute calvarial abnormality Sinuses/Orbits: Visualized paranasal sinuses and mastoids clear. Orbital soft tissues unremarkable. Other: None IMPRESSION: Diffuse atrophy, most pronounced in the frontal lobes were there is encephalomalacia. No acute intracranial abnormality. Electronically Signed   By: Rolm Baptise M.D.   On: 10/17/2015 15:08   US Renal  Result Date: 10/18/2015 CLINICAL DATA:  Urinary tract infections with history of nephrolithiasis EXAM: RENAL / URINARY TRACT ULTRASOUND COMPLETE COMPARISON:  CT abdomen and pelvis September 26, 2015 FINDINGS: Right Kidney: Length: 11.0 cm. Echogenicity and renal cortical thickness are within normal limits. No mass, perinephric fluid, or hydronephrosis visualized. There is a calculus in the upper to midportion of the right kidney measuring 7 mm. No ureterectasis evident. Left Kidney: Length: 11.1 cm. Echogenicity and renal cortical thickness are within normal limits. No mass or perinephric fluid visualized. There is mild fullness of the left renal collecting system/pelvicaliectasis. No ureterectasis evident. There is a calculus in the lower pole the left kidney measuring 9 mm. Bladder: Appears  normal for degree of bladder distention. IMPRESSION: Bilateral intrarenal calculi. Mild pelvicaliectasis on the left. Question more distal obstruction on the left, although no ureterectasis is not evident by ultrasound on this study. Study otherwise unremarkable. Electronically Signed   By: Lowella Grip III M.D.   On: 10/18/2015 11:54   Dg Chest Port 1 View  Result Date: 10/17/2015 CLINICAL DATA:  Altered mental status EXAM: PORTABLE CHEST 1 VIEW COMPARISON:  09/26/2015 FINDINGS: The study is limited by patient's large body habitus and poor inspiration. Cardiomegaly again noted. Left basilar atelectasis. No gross infiltrate or pulmonary edema. IMPRESSION: Limited study as described above. Cardiomegaly. No gross infiltrate or pulmonary edema. Left basilar atelectasis. Electronically Signed   By: Lahoma Crocker M.D.   On: 10/17/2015 13:37        Scheduled Meds: . ALPRAZolam  1 mg Oral BID  . aspirin EC  81 mg Oral q morning - 10a  . busPIRone  10 mg Oral q morning - 10a  . chlorproMAZINE  25 mg Oral BID  .  docusate sodium  100 mg Oral BID  . enoxaparin (LOVENOX) injection  30 mg Subcutaneous Q24H  . escitalopram  20 mg Oral QHS  . feeding supplement (GLUCERNA SHAKE)  237 mL Oral TID WC  . imipenem-cilastatin  500 mg Intravenous Q12H  . levothyroxine  50 mcg Oral QAC breakfast  . OLANZapine  20 mg Oral Daily  . oxybutynin  10 mg Oral q morning - 10a  . pantoprazole  40 mg Oral Daily  . zolpidem  5 mg Oral QHS   Continuous Infusions: . lactated ringers 125 mL/hr at 10/19/15 0831     LOS: 2 days    Time spent: 35 minutes    Rechelle Niebla A, MD [facility] Pager [phone]  If 7PM-7AM, please contact night-coverage [email or web address] Password [email or web address] 10/19/2015, 11:04 AM

## 2015-10-19 NOTE — Consult Note (Signed)
Consultation Note Date: 10/19/2015   Patient Name: Rachel Morgan  DOB: 02-22-1950  MRN: [id number]  Age / Sex: 65 y.o., female  PCP: [facility] Referring Physician: Verlee Monte, MD  Reason for Consultation: Establishing goals of care, Hospice Evaluation and Psychosocial/spiritual support  HPI/Patient Profile: 65 y.o. female  with past medical history of cerebral palsy, MR, diabetes, gallstones, anxiety, [name], morbid obesity, hypertension, high cholesterol, hypothyroidism, history of kidney stones, pyelonephritis admitted on 10/17/2015 with UTI with encephalopathy.   Clinical Assessment and Goals of Care: Rachel Morgan makes eye contact and greets me when I say hello to her. She is, at times, able to answer simple questions with yes and no. No family at bedside at this time.  I return later, and sister Rachel Morgan is resting quietly at bedside. We talk about Rachel Morgan's chronic and current health problems. I share a diagram of the chronic illness pathway, what is normal and expected. Rachel Morgan states several times that she has to seen a decline in her sister after every hospitalization. Rachel Morgan states that she realizes her sister is becoming weaker and is probably near end of life. We talk about the expected changes; sleeping more, eating less, interacting less. Rachel Morgan states her sister is experiencing all of these (functional decline in particular).  We talk about functional status, Rachel Morgan states that her sister has an aide Monday through Friday 8 to 4, and on the weekends 9 to 4:30.   Rachel Morgan shares that they lost their mother about 4 years ago with cancer, and their father about 9 years ago with dementia. She states that both had the benefit of hospice in home.  Rachel Morgan states that she would like for her sister to have hospice of [place] in home. Rachel Morgan states that her  preferences for her sister to pass away at home. I share the Rachel Morgan may not qualify for hospice at this time, but it would benefit her to be on surveillance with them.    Rachel Morgan goes on to share that some of her siblings have blamed her for Rachel Morgan's decline. I share what is normal in and expected in chronic illness. And that Rachel Morgan is expected to continue with urinary tract infections, partly because of her body habitus. I also share that, at any time, she could become critically ill with another UTI, and we would be unable to prevent her passing. Rachel Morgan states she is aware, and excepting, that this may happen.  We talk about the concepts of do not re-hospitalize, do not treat the next infection. I share that she is welcome to bring her sister back to the hospital at any time, but there may come a time when, with the support of hospice, they let nature take its course. I encourage Rachel Morgan to share some of what we have spoken about with her siblings, the expected declines with repeat hospitalizations, the natural declines to come with chronic illness, and the benefits of hospice.  Healthcare power of attorney LEGAL GUARDIAN - sister Rachel Morgan. She  states that some of her siblings have expressed anger/concern that she is healthcare power of attorney/legal guardian, but none of them applied for guardianship. She shares that Rachel Morgan is the oldest, and that she is next to youngest.   SUMMARY OF RECOMMENDATIONS   continue to treat the treatable at this point, but no extraordinary measures such as CPR or intubation. Rachel Morgan is requesting hospice services with hospice of [place] in the home.  Rachel Morgan understands that her sister may not qualify at this point, but understands the importance of having her sister on surveillance, and hospice ready to step in when the time is right. When the time comes, she is ready to let nature take its course.  Code Status/Advance Care Planning:  DNR - allow  a natural death  Symptom Management:   per hospitalist  Palliative Prophylaxis:   Frequent Pain Assessment and Turn Reposition  Additional Recommendations (Limitations, Scope, Preferences):  Continue to treat the treatable at this point, but no extraordinary measures sister Rachel Morgan interested in hospice of [place].  Psycho-social/Spiritual:   Desire for further Chaplaincy support:no  Additional Recommendations: Caregiving  Support/Resources and Education on Hospice  Prognosis:   < 12 months, likely, less than 6 months would not be surprising based on functional decline, 3 hospitalizations in 6 months, chronic disease burden related to recurrent UTIs/kidney stones.   Discharge Planning: Rachel Morgan's open to home with home health if needed, she would like to be considered for services with hospice of [place] in the home. If necessary she is requesting hospice surveillance.      Primary Diagnoses: Present on Admission: . Encephalopathy . Acute pyelonephritis . Acute renal failure ([place]) . Cerebral palsy ([place]) . CKD (chronic kidney disease), stage III . Depression with anxiety . Hypertension   I have reviewed the medical record, interviewed the patient and family, and examined the patient. The following aspects are pertinent.  Past Medical History:  Diagnosis Date  . Anxiety   . Calculus of gallbladder   . Cerebral palsy ([place])   . Chronic kidney disease   . Diabetes mellitus   . Gall stones   . GERD (gastroesophageal reflux disease)   . High cholesterol   . Hypertension   . Hypothyroidism   . Kidney stones   . MR (mental retardation)   . Pyelonephritis   . Sepsis [facility])    Social History   Social History  . Marital status: Single    Spouse name: N/A  . Number of children: N/A  . Years of education: N/A   Social History Main Topics  . Smoking status: Never Smoker  . Smokeless tobacco: Never Used  . Alcohol use No  . Drug use: No    . Sexual activity: No   Other Topics Concern  . None   Social History Narrative  . None   Family History  Problem Relation Age of Onset  . Family history unknown: Yes   Scheduled Meds: . ALPRAZolam  1 mg Oral BID  . aspirin EC  81 mg Oral q morning - 10a  . busPIRone  10 mg Oral q morning - 10a  . chlorproMAZINE  25 mg Oral BID  . docusate sodium  100 mg Oral BID  . enoxaparin (LOVENOX) injection  30 mg Subcutaneous Q24H  . escitalopram  20 mg Oral QHS  . feeding supplement (GLUCERNA SHAKE)  237 mL Oral TID WC  . imipenem-cilastatin  500 mg Intravenous Q12H  . levothyroxine  50 mcg Oral QAC breakfast  .  OLANZapine  20 mg Oral Daily  . oxybutynin  10 mg Oral q morning - 10a  . pantoprazole  40 mg Oral Daily  . zolpidem  5 mg Oral QHS   Continuous Infusions: . sodium chloride 75 mL/hr at 10/19/15 1230   PRN Meds:.acetaminophen **OR** acetaminophen, ondansetron **OR** ondansetron (ZOFRAN) IV, traMADol Medications Prior to Admission:  Prior to Admission medications   Medication Sig Start Date End Date Taking? Authorizing Provider  ALPRAZolam Duanne Moron) 1 MG tablet Take 1 mg by mouth 2 (two) times daily.    Yes Historical Provider, MD  aspirin EC 81 MG tablet Take 81 mg by mouth every morning.    Yes Historical Provider, MD  busPIRone (BUSPAR) 10 MG tablet Take 10 mg by mouth every morning.    Yes Historical Provider, MD  chlorproMAZINE (THORAZINE) 25 MG tablet Take 25 mg by mouth at bedtime.    Yes Historical Provider, MD  escitalopram (LEXAPRO) 20 MG tablet Take 20 mg by mouth at bedtime.   Yes Historical Provider, MD  ibuprofen (ADVIL,MOTRIN) 200 MG tablet Take 400 mg by mouth every 6 (six) hours as needed for moderate pain.   Yes Historical Provider, MD  levothyroxine (SYNTHROID, LEVOTHROID) 50 MCG tablet Take 50 mcg by mouth daily before breakfast.   Yes Historical Provider, MD  OLANZapine (ZYPREXA) 20 MG tablet Take 20 mg by mouth daily.  07/24/15  Yes Historical Provider, MD   omeprazole (PRILOSEC) 20 MG capsule Take 20 mg by mouth every morning.    Yes Historical Provider, MD  oxybutynin (DITROPAN-XL) 10 MG 24 hr tablet Take 10 mg by mouth every morning.    Yes Historical Provider, MD  spironolactone (ALDACTONE) 50 MG tablet Take 50 mg by mouth 2 (two) times daily.   Yes Historical Provider, MD  sulfamethoxazole-trimethoprim (BACTRIM DS,SEPTRA DS) 800-160 MG tablet Take 1 tablet by mouth 2 (two) times daily. 10/16/15  Yes Historical Provider, MD  traMADol (ULTRAM) 50 MG tablet Take 50 mg by mouth every 6 (six) hours as needed for severe pain.  09/09/15  Yes Historical Provider, MD  zolpidem (AMBIEN) 10 MG tablet Take 10 mg by mouth at bedtime.   Yes Historical Provider, MD   Allergies  Allergen Reactions  . Macrobid [Nitrofurantoin Monohyd Macro] Hives and Swelling  . Penicillins Swelling and Rash    Has patient had a PCN reaction causing immediate rash, facial/tongue/throat swelling, SOB or lightheadedness with hypotension: Yes Has patient had a PCN reaction causing severe rash involving mucus membranes or skin necrosis: Yes Has patient had a PCN reaction that required hospitalization: no Has patient had a PCN reaction occurring within the last 10 years: no If all of the above answers are "NO", then may proceed with Cephalosporin use.    Review of Systems  Unable to perform ROS: Patient nonverbal    Physical Exam  Constitutional: No distress.  Frail appearing, morbidly obese  HENT:  Head: Normocephalic and atraumatic.  Cardiovascular: Normal rate and regular rhythm.   Pulmonary/Chest: Effort normal. No respiratory distress.  Abdominal: Soft.  Obese abdomen  Neurological: She is alert.  Nonverbal, unable to determine orientation. Makes eye contact  Skin: Skin is warm and dry.  Nursing note and vitals reviewed.   Vital Signs: BP 118/60 (BP Location: Right Arm)   Pulse 77   Temp 98 F (36.7 C) (Oral)   Resp 20   Ht 5' (1.524 m)   Wt 119.6 kg (263  lb 11.4 oz)   SpO2 100%  BMI 51.50 kg/m  Pain Assessment: No/denies pain   Pain Score: 0-No pain   SpO2: SpO2: 100 % O2 Device:SpO2: 100 % O2 Flow Rate: .O2 Flow Rate (L/min): 2 L/min  IO: Intake/output summary:  Intake/Output Summary (Last 24 hours) at 10/19/15 1644 Last data filed at 10/19/15 1500  Gross per 24 hour  Intake             4955 ml  Output                0 ml  Net             4955 ml    LBM: Last BM Date: 10/14/15 Baseline Weight: Weight: 119.7 kg (264 lb) Most recent weight: Weight: 119.6 kg (263 lb 11.4 oz)     Palliative Assessment/Data:   Flowsheet Rows   Flowsheet Row Most Recent Value  Intake Tab  Referral Department  Hospitalist  Unit at Time of Referral  Med/Surg Unit  Palliative Care Primary Diagnosis  Neurology  Date Notified  10/17/15  Palliative Care Type  New Palliative care  Reason for referral  Clarify Goals of Care  Date of Admission  10/17/15  Date first seen by Palliative Care  10/19/15  # of days Palliative referral response time  2 Day(s)  # of days IP prior to Palliative referral  0  Clinical Assessment  Palliative Performance Scale Score  40%  Pain Max last 24 hours  Not able to report  Pain Min Last 24 hours  Not able to report  Dyspnea Max Last 24 Hours  Not able to report  Dyspnea Min Last 24 hours  Not able to report  Psychosocial & Spiritual Assessment  Palliative Care Outcomes  Patient/Family meeting held?  Yes  Who was at the meeting?  With sister/legal guardian Rachel Morgan  Palliative Care Outcomes  Provided psychosocial or spiritual support, Provided advance care planning, Clarified goals of care, Counseled regarding hospice  Patient/Family wishes: Interventions discontinued/not started   Mechanical Ventilation  Palliative Care follow-up planned  -- [Follow-up while at [facility]]      Time In: 1600 Time Out: 1635 Time Total: 35 minutes Greater than 50%  of this time was spent counseling and coordinating care  related to the above assessment and plan.  Signed by: Drue Novel, NP   Please contact Palliative Medicine Team phone at [phone] for questions and concerns.  For individual provider: See Shea Evans

## 2015-10-19 NOTE — Progress Notes (Signed)
Pharmacy Antibiotic Note  Rachel Morgan is a 65 y.o. female admitted on 10/17/2015 with UTI.  Pharmacy has been consulted for ROCEPHIN dosing. UC shows ESBL E coli.  Will change to primaxin  Plan: Primaxin 500 mg IV q12 hours Monitor labs, progress, c/s  Height: 5' (152.4 cm) Weight: 263 lb 11.4 oz (119.6 kg) IBW/kg (Calculated) : 45.5  Temp (24hrs), Avg:98.2 F (36.8 C), Min:97.7 F (36.5 C), Max:99 F (37.2 C)   Recent Labs Lab 10/17/15 1227 10/17/15 1321 10/17/15 1637 10/18/15 0553  WBC 28.8*  --   --  18.5*  CREATININE 4.15*  --   --  3.43*  LATICACIDVEN  --  1.5 1.2  --     Estimated Creatinine Clearance: 19.9 mL/min (by C-G formula based on SCr of 3.43 mg/dL (H)).    Allergies  Allergen Reactions  . Macrobid [Nitrofurantoin Monohyd Macro] Hives and Swelling  . Penicillins Swelling and Rash    Has patient had a PCN reaction causing immediate rash, facial/tongue/throat swelling, SOB or lightheadedness with hypotension: Yes Has patient had a PCN reaction causing severe rash involving mucus membranes or skin necrosis: Yes Has patient had a PCN reaction that required hospitalization: no Has patient had a PCN reaction occurring within the last 10 years: no If all of the above answers are "NO", then may proceed with Cephalosporin use.    Antimicrobials this admission: Rocephin 10/10 >> 10/12 Primaxin  10/12>>  Dose adjustments this admission:  Microbiology results:  BCx:   UCx: ESBL E coli  Sputum:   MRSA PCR:  Thank you for allowing pharmacy to be a part of this patient's care.  Excell Seltzer P[place]et 10/19/2015 8:15 AM

## 2015-10-20 LAB — CBC
HCT: 32.3 % — ABNORMAL LOW (ref 36.0–46.0)
Hemoglobin: 10.2 g/dL — ABNORMAL LOW (ref 12.0–15.0)
MCH: 28.8 pg (ref 26.0–34.0)
MCHC: 31.6 g/dL (ref 30.0–36.0)
MCV: 91.2 fL (ref 78.0–100.0)
Platelets: 182 10*3/uL (ref 150–400)
RBC: 3.54 MIL/uL — ABNORMAL LOW (ref 3.87–5.11)
RDW: 14.2 % (ref 11.5–15.5)
WBC: 7.5 10*3/uL (ref 4.0–10.5)

## 2015-10-20 LAB — BASIC METABOLIC PANEL
Anion gap: 3 — ABNORMAL LOW (ref 5–15)
BUN: 34 mg/dL — AB (ref 6–20)
CALCIUM: 7.6 mg/dL — AB (ref 8.9–10.3)
CO2: 22 mmol/L (ref 22–32)
CREATININE: 1.87 mg/dL — AB (ref 0.44–1.00)
Chloride: 112 mmol/L — ABNORMAL HIGH (ref 101–111)
GFR calc non Af Amer: 28 mL/min — ABNORMAL LOW (ref >60)
GFR, EST AFRICAN AMERICAN: 32 mL/min — AB (ref >60)
Glucose, Bld: 114 mg/dL — ABNORMAL HIGH (ref 65–99)
Potassium: 5.1 mmol/L (ref 3.5–5.1)
SODIUM: 137 mmol/L (ref 135–145)

## 2015-10-20 LAB — MAGNESIUM: MAGNESIUM: 1.5 mg/dL — AB (ref 1.7–2.4)

## 2015-10-20 MED ORDER — MAGNESIUM HYDROXIDE 400 MG/5ML PO SUSP
30.0000 mL | Freq: Once | ORAL | Status: AC
  Administered 2015-10-20: 30 mL via ORAL

## 2015-10-20 MED ORDER — SODIUM CHLORIDE 0.9 % IV SOLN
500.0000 mg | Freq: Three times a day (TID) | INTRAVENOUS | Status: DC
  Administered 2015-10-20 – 2015-10-21 (×2): 500 mg via INTRAVENOUS
  Filled 2015-10-20 (×9): qty 500

## 2015-10-20 MED ORDER — ORAL CARE MOUTH RINSE
15.0000 mL | Freq: Two times a day (BID) | OROMUCOSAL | Status: DC
  Administered 2015-10-20 – 2015-10-21 (×3): 15 mL via OROMUCOSAL

## 2015-10-20 MED ORDER — POLYETHYLENE GLYCOL 3350 17 G PO PACK
17.0000 g | PACK | Freq: Every day | ORAL | Status: DC
  Administered 2015-10-20 – 2015-10-21 (×2): 17 g via ORAL
  Filled 2015-10-20: qty 1

## 2015-10-20 MED ORDER — ENOXAPARIN SODIUM 40 MG/0.4ML ~~LOC~~ SOLN
40.0000 mg | SUBCUTANEOUS | Status: DC
  Administered 2015-10-20: 40 mg via SUBCUTANEOUS
  Filled 2015-10-20: qty 0.4

## 2015-10-20 NOTE — Care Management (Signed)
Spoke with [facility], they will see patient when discharged. Patient will most likely qualify for hospice services but if not, the [place] RN will order home health services for patient. Will Call [place] back when patient is discharging.

## 2015-10-20 NOTE — Care Management (Signed)
Patient will discharge home Saturday with IV antibiotics. Patient is active with [facility], they do not feel they can offer IV antibiotics HH services. Spoke with [name] (sister) she is agreeable to having [facility] provide [place] services. HH PT and RN will be ordered. Romualdo Bolk of [facility] notified. Nursing staff will need to fax orders to [facility] on  Saturday when patient is discharged and [facility] will start IV antibiotics on Sunday. [facility] is aware that patient will have palliative supportive services with [facility] as patient did not qualify for full hospice services at this time.

## 2015-10-20 NOTE — Care Management Important Message (Signed)
Important Message  Patient Details  Name: Rachel Morgan MRN: [id number] Date of Birth: 1950-04-07   Medicare Important Message Given:  Yes    Jahlil Ziller, Chauncey [place], RN 10/20/2015, 5:26 PM

## 2015-10-20 NOTE — Progress Notes (Signed)
PROGRESS NOTE  Rachel Morgan  LNL:[id number] DOB: [birth year] DOA: 10/17/2015 PCP: [facility] Outpatient Specialists:  Subjective: Seen with her sister [name] at bedside, patient is smiling when I entered the room but nonverbal. Appears to be back to her baseline.  Brief Narrative:  Rachel Morgan is a 65 y.o. female with medical history significant of  cerebral palsy/intellectual disability as well as hypothyroidism, nephrolithiasis with recurrent UTIs, DM, and CKD.  The patient is currently alert but unable to effectively communicate.  Her sister reports that she has been fatigued, not eating.  She also fell yesterday.  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 Jeffie Pollock on 10/17.   The patient has been unable to communicate, staring off into space.  Normally able to watch TV, play with nephew, carry on conversation.  She has been complaining of RLQ/flank pain.  Her sister thinks she may have dysuria, possibly mild hematuria, and there is a bad odor to the urine.  Fever to 101 Saturday.  Assessment & Plan:   Principal Problem:   Encephalopathy Active Problems:   Acute pyelonephritis   Acute renal failure (HCC)   Hypertension   Depression with anxiety   CKD (chronic kidney disease), stage III   Nephrolithiasis   Cerebral palsy (HCC)   Goals of care, counseling/discussion   Palliative care encounter   DNR (do not resuscitate) discussion   ESBL Escherichia coli UTI/acute right-sided pyelonephritis -Patient with reported c/o urinary symptoms as well as right-sided flank pain -Suspect right-sided pyelo, although the UTI could be lower tract disease as well. -Renal ultrasound did not show convincing hydronephrosis.  -Initially was on Rocephin, urine grew Escherichia coli which identified as ESBL. Started on  Primaxin.  ARF on CKD -Baseline creatinine appears to be 1.2-1.3, admitted with creatinine of 4.15. -Hold nephrotoxic agents including Motrin and diuretics -Check renal ultrasound to rule out hydronephrosis. Has bilateral ureteral stents, no obstruction per ultrasound. -This is improving, close to his baseline creatinine today is 1.8.  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  Nephrolithiasis -Has left proximal ureteral 14 mm stone with plan for left ureteroscopy by Dr. Jeffie Pollock at [facility] on 10/24/15. -Also has non-obstructing 6 mm right renal stone -JJ stents placed 9/19 due to urosepsis -If further evidence of urosepsis occurs during this admission, urology may need to intervene sooner than planned. -Currently, they are on board with admission to hospitalist service, IVF, antibiotics, and continued antibiotics through planned procedure.  Depression with anxiety -She takes a number of psychotropic medications -Will continue all home medications -No recent TSH in our system; will check and continue Synthroid at home dose for now  Goals of care -Discussion held with sister regarding her goals for the patient -Their parents died difficult deaths fairly recently (cancer, Alzheimer's) and the sister would not want to go through again -She has asked that the patient be DNR -Palliative medicine team consulted for goals of care.  Constipation -No bowel movement in 3-4 days, will give MiraLAX and milk of magnesia, ambulate.  DVT prophylaxis: Lovenox Code Status: DNR Family Communication:  Disposition Plan:  Diet: Diet regular Room service appropriate? Yes; Fluid consistency: Thin  Consultants:    None  Procedures:   None  Antimicrobials:   Rocephin  Objective: Vitals:   10/19/15 1832 10/19/15 1928 10/19/15 2006 10/20/15 0635  BP: 122/62  (!) 105/45 (!) 120/48  Pulse: 77  73 60  Resp: 20  20 20   Temp: 98.8 F (37.1 C)  98.5 F (36.9 C) 97.5 F (36.4 C)  TempSrc: Oral  Oral Oral  SpO2: 100% 96% 95% 96%  Weight:      Height:        Intake/Output Summary (Last 24 hours) at 10/20/15 1043 Last data filed at 10/19/15 2211  Gross per 24 hour  Intake           2972.5 ml  Output                0 ml  Net           2972.5 ml   Filed Weights   10/17/15 1728 10/19/15 0553  Weight: 119.7 kg (264 lb) 119.6 kg (263 lb 11.4 oz)    Examination: General exam: Appears calm and comfortable  Respiratory system: Clear to auscultation. Respiratory effort normal. Cardiovascular system: S1 & S2 heard, RRR. No JVD, murmurs, rubs, gallops or clicks. No pedal edema. Gastrointestinal system: Abdomen is nondistended, soft and nontender. No organomegaly or masses felt. Normal bowel sounds heard. Central nervous system: Alert and oriented. No focal neurological deficits. Extremities: Symmetric 5 x 5 power. Skin: No rashes, lesions or ulcers Psychiatry: Judgement and insight appear normal. Mood & affect appropriate.   Data Reviewed: I have personally reviewed following labs and imaging studies  CBC:  Recent Labs Lab 10/17/15 1227 10/18/15 0553 10/20/15 0609  WBC 28.8* 18.5* 7.5  HGB 12.3 10.6* 10.2*  HCT 36.6 33.1* 32.3*  MCV 89.3 89.7 91.2  PLT 231 221 382   Basic Metabolic Panel:  Recent Labs Lab 10/17/15 1227 10/18/15 0553 10/19/15 0907 10/20/15 0609  NA 139 141 135 137  K 5.1 4.9 5.0 5.1  CL 111 112* 107 112*  CO2 22 22 22 22   GLUCOSE 166* 107* 148* 114*  BUN 70* 65* 43* 34*  CREATININE 4.15* 3.43* 2.35* 1.87*  CALCIUM 8.3* 8.2* 8.2* 7.6*  MG  --   --   --  1.5*   GFR: Estimated Creatinine Clearance: 36.5 mL/min (by C-G formula based on SCr of 1.87 mg/dL  (H)). Liver Function Tests:  Recent Labs Lab 10/17/15 1227  AST 28  ALT 18  ALKPHOS 79  BILITOT 0.6  PROT 8.2*  ALBUMIN 3.1*   No results for input(s): LIPASE, AMYLASE in the last 168 hours. No results for input(s): AMMONIA in the last 168 hours. Coagulation Profile:  Recent Labs Lab 10/17/15 1321  INR 1.28   Cardiac Enzymes: No results for input(s): CKTOTAL, CKMB, CKMBINDEX, TROPONINI in the last 168 hours. BNP (last 3 results) No results for input(s): PROBNP in the last 8760 hours. HbA1C: No results for input(s): HGBA1C in the last 72 hours. CBG:  Recent Labs Lab 10/17/15 1216  GLUCAP 176*   Lipid Profile: No results for input(s): CHOL, HDL, LDLCALC, TRIG, CHOLHDL, LDLDIRECT in the last 72 hours. Thyroid Function Tests:  Recent Labs  10/17/15 1637  TSH 3.259   Anemia Panel: No results for input(s): VITAMINB12, FOLATE, FERRITIN, TIBC, IRON, RETICCTPCT in the last 72 hours. Urine analysis:    Component Value Date/Time   COLORURINE BROWN (A) 10/17/2015  1355   APPEARANCEUR CLOUDY (A) 10/17/2015 1355   LABSPEC 1.020 10/17/2015 1355   PHURINE 6.0 10/17/2015 1355   GLUCOSEU NEGATIVE 10/17/2015 1355   HGBUR LARGE (A) 10/17/2015 1355   BILIRUBINUR MODERATE (A) 10/17/2015 [place] 10/17/2015 1355   PROTEINUR >300 (A) 10/17/2015 1355   UROBILINOGEN 1.0 02/11/2011 2035   NITRITE POSITIVE (A) 10/17/2015 1355   LEUKOCYTESUR LARGE (A) 10/17/2015 1355   Sepsis Labs: @LABRCNTIP (procalcitonin:4,lacticidven:4)  ) Recent Results (from the past 240 hour(s))  Urine culture     Status: Abnormal   Collection Time: 10/17/15  1:55 PM  Result Value Ref Range Status   Specimen Description URINE, CATHETERIZED  Final   Special Requests NONE  Final   Culture (A)  Final    10,000 COLONIES/mL ESCHERICHIA COLI Confirmed Extended Spectrum Beta-Lactamase Producer (ESBL) Performed at [facility]    Report Status 10/19/2015 FINAL  Final   Organism  ID, Bacteria ESCHERICHIA COLI (A)  Final      Susceptibility   Escherichia coli - MIC*    AMPICILLIN >=32 RESISTANT Resistant     CEFAZOLIN >=64 RESISTANT Resistant     CEFTRIAXONE >=64 RESISTANT Resistant     CIPROFLOXACIN >=4 RESISTANT Resistant     GENTAMICIN >=16 RESISTANT Resistant     IMIPENEM <=0.25 SENSITIVE Sensitive     NITROFURANTOIN <=16 SENSITIVE Sensitive     TRIMETH/SULFA <=20 SENSITIVE Sensitive     AMPICILLIN/SULBACTAM >=32 RESISTANT Resistant     PIP/TAZO <=4 SENSITIVE Sensitive     Extended ESBL POSITIVE Resistant     * 10,000 COLONIES/mL ESCHERICHIA COLI     Invalid input(s): PROCALCITONIN, LACTICACIDVEN   Radiology Studies: US Renal  Result Date: 10/18/2015 CLINICAL DATA:  Urinary tract infections with history of nephrolithiasis EXAM: RENAL / URINARY TRACT ULTRASOUND COMPLETE COMPARISON:  CT abdomen and pelvis September 26, 2015 FINDINGS: Right Kidney: Length: 11.0 cm. Echogenicity and renal cortical thickness are within normal limits. No mass, perinephric fluid, or hydronephrosis visualized. There is a calculus in the upper to midportion of the right kidney measuring 7 mm. No ureterectasis evident. Left Kidney: Length: 11.1 cm. Echogenicity and renal cortical thickness are within normal limits. No mass or perinephric fluid visualized. There is mild fullness of the left renal collecting system/pelvicaliectasis. No ureterectasis evident. There is a calculus in the lower pole the left kidney measuring 9 mm. Bladder: Appears normal for degree of bladder distention. IMPRESSION: Bilateral intrarenal calculi. Mild pelvicaliectasis on the left. Question more distal obstruction on the left, although no ureterectasis is not evident by ultrasound on this study. Study otherwise unremarkable. Electronically Signed   By: Lowella Grip III M.D.   On: 10/18/2015 11:54        Scheduled Meds: . ALPRAZolam  1 mg Oral BID  . aspirin EC  81 mg Oral q morning - 10a  . busPIRone   10 mg Oral q morning - 10a  . chlorproMAZINE  25 mg Oral BID  . docusate sodium  100 mg Oral BID  . enoxaparin (LOVENOX) injection  30 mg Subcutaneous Q24H  . escitalopram  20 mg Oral QHS  . feeding supplement (GLUCERNA SHAKE)  237 mL Oral TID WC  . imipenem-cilastatin  500 mg Intravenous Q12H  . levothyroxine  50 mcg Oral QAC breakfast  . mouth rinse  15 mL Mouth Rinse BID  . OLANZapine  20 mg Oral Daily  . oxybutynin  10 mg Oral q morning - 10a  . pantoprazole  40  mg Oral Daily  . zolpidem  5 mg Oral QHS   Continuous Infusions: . sodium chloride 75 mL/hr at 10/19/15 2211     LOS: 3 days    Time spent: 35 minutes    Yohanna Tow A, MD [facility] Pager [phone]  If 7PM-7AM, please contact night-coverage [email or web address] Password [email or web address] 10/20/2015, 10:43 AM

## 2015-10-20 NOTE — Evaluation (Signed)
Physical Therapy Evaluation Patient Details Name: ROMANA DEATON MRN: [id number] DOB: 29-Mar-1950 Today's Date: 10/20/2015   History of Present Illness  65 y.o. female with medical history significant of  cerebral palsy/intellectual disability as well as hypothyroidism, nephrolithiasis with recurrent UTIs, DM, and CKD.  The patient is currently alert but unable to effectively communicate.  Her sister reports that she has been fatigued, not eating.  She also fell yesterday.  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 Jeffie Pollock on 10/17.   Dx: ESBL Escherichia coli UTI/acute right-sided pyelonephritis, UTI, acute encephalopathy.  PMH: Cerebral palsy with intellectual disability, Anxiety, calculus of gallbladder, cerebral palsy, CKD, DM, gall stones, HTN, hypothyroidism, kidney stones, mental retardation, pyelonephritis, sepsis, cystoscopy with ureteral stent 09/26/2015.  Clinical Impression  Pt received in bed, sister present, and provided all PLOF information.  Pt lives with her sister, and has 24/7 supervision/assist from family and caregivers.  Pt is normally able to ambulate short household distances without any DME, but requires assistance for dressing and bathing.  Today, she required Max A+2 for supine<>sit, and Min/Mod A for SPT from the bed<>chair.  Sister states that she is making improvements, however she is not at her baseline yet.  At this point, she plans to have hospice come to the house to evaluate the patient.  If she does not qualify, then she would benefit from [place].  She would also benefit from a bariatric w/c for ease of mobility.     Follow Up Recommendations Home health PT    Equipment Recommendations  Wheelchair (measurements PT) (bariatric w/c - Height: 61ft, Weight: 119.62kg, BMI: 51.7.)    Recommendations for Other Services       Precautions  / Restrictions Precautions Precautions: Fall Precaution Comments: 5 falls in the past 6 months - usually happens after she has an infection.  Restrictions Weight Bearing Restrictions: No      Mobility  Bed Mobility Overal bed mobility: Needs Assistance Bed Mobility: Supine to Sit     Supine to sit: HOB elevated;+2 for physical assistance (increased time due to poor comprehension of commands.  Sister states they normally assist her 52.  Use of bed pad to assist pt to the EOB.  )        Transfers Overall transfer level: Needs assistance Equipment used: None Transfers: Sit to/from [facility] Sit to Stand: Min assist Stand pivot transfers: Mod assist       General transfer comment: Pt was able to take a few steps to transfer from the bed<>chair  Ambulation/Gait Ambulation/Gait assistance:  (Deferred at this time due to fatigue. )              Stairs            Wheelchair Mobility    Modified Rankin (Stroke Patients Only)       Balance Overall balance assessment: Needs assistance Sitting-balance support: Bilateral upper extremity supported Sitting balance-Leahy Scale: Fair Sitting balance - Comments: poor balance due to pt's short stature, as well as body habits.   Postural control: Posterior lean Standing balance support: Bilateral upper extremity supported Standing balance-Leahy Scale: Fair                               Pertinent Vitals/Pain Pain Assessment: Faces Faces  Pain Scale: Hurts little more Pain Location: R side.  Pain Intervention(s): Limited activity within patient's tolerance;Repositioned;Monitored during session    Home Living   Living Arrangements: Other relatives;Other (Comment) (sister [name]) Available Help at Discharge: Personal care attendant (aides M-F from 8-4 and Sat/Sun from 9-4:30.  ) Type of Home: House Home Access: Level entry     Home Layout: One level Home Equipment: Bedside  commode      Prior Function     Gait / Transfers Assistance Needed: Pt is normally independent with household ambulation.  Sister states that she goes from her bedroom to the living room, and that's it.   ADL's / Homemaking Assistance Needed: Pt requires assistance with sponge bathing, and assist with dressing.    Comments: All PLOF information is obtained from her sister [name], who she lives with.      Hand Dominance   Dominant Hand: Right    Extremity/Trunk Assessment   Upper Extremity Assessment: Generalized weakness           Lower Extremity Assessment: Generalized weakness         Communication   Communication: Other (comment) (CP)  Cognition Arousal/Alertness: Awake/alert Behavior During Therapy: WFL for tasks assessed/performed Overall Cognitive Status: Impaired/Different from baseline (Pt has mental retardation at baseline, sister states that she is not back to her baseline yet, but she has improved since admission. )                      General Comments      Exercises     Assessment/Plan    PT Assessment Patient needs continued PT services  PT Problem List Obesity;Decreased strength;Decreased activity tolerance;Decreased balance;Decreased mobility;Decreased knowledge of use of DME;Decreased safety awareness;Decreased knowledge of precautions;Cardiopulmonary status limiting activity          PT Treatment Interventions DME instruction;Stair training;Functional mobility training;Therapeutic activities;Therapeutic exercise;Balance training;Patient/family education    PT Goals (Current goals can be found in the Care Plan section)  Acute Rehab PT Goals Patient Stated Goal: To go home.  PT Goal Formulation: With patient/family Time For Goal Achievement: 10/27/15 Potential to Achieve Goals: Fair    Frequency Min 3X/week   Barriers to discharge        Co-evaluation               End of Session Equipment Utilized During Treatment:  Gait belt;Oxygen Activity Tolerance: Patient limited by fatigue Patient left: in chair;with call bell/phone within reach;with family/visitor present Nurse Communication: Mobility status (Transfers with assistance. )    Functional Assessment Tool Used: Walker "6-clicks"  Functional Limitation: Mobility: Walking and moving around Mobility: Walking and Moving Around Current Status [phone]): At least 60 percent but less than 80 percent impaired, limited or restricted Mobility: Walking and Moving Around Goal Status [phone]): At least 40 percent but less than 60 percent impaired, limited or restricted    Time: 1130-1154 PT Time Calculation (min) (ACUTE ONLY): 24 min   Charges:   PT Evaluation $PT Eval Moderate Complexity: 1 Procedure PT Treatments $Therapeutic Activity: 8-22 mins   PT G Codes:   PT G-Codes **NOT FOR INPATIENT CLASS** Functional Assessment Tool Used: [employer] "6-clicks"  Functional Limitation: Mobility: Walking and moving around Mobility: Walking and Moving Around Current Status [phone]): At least 60 percent but less than 80 percent impaired, limited or restricted Mobility: Walking and Moving Around Goal Status [phone]): At least 40 percent but less than 60 percent impaired, limited or restricted  Beth Jamison Yuhasz, PT, DPT X: [phone]

## 2015-10-20 NOTE — Care Management (Signed)
MD made aware to co-sign home health orders. Nursing made aware to fax signed home health order to [facility] (number in sticky notes) tomorrow when patient discharges. [facility] will come out Sunday to see patient.

## 2015-10-20 NOTE — Care Management (Signed)
Wing, Gfeller #[id number] (CSN: [id number]) (65 y.o. F) (Adm: 10/17/15)  [id number]  SS # [id number]  PCP  [place]   Demographics  Comment    Last edited by  on at   Address: Home Phone: Work Futures trader:  [employer] [place]  [place] [address]   [phone] -- --  SSN: Insurance: Marital Status: Religion:  [id number] UNITED HEALTHCARE MEDICARE Single (none)  Patient Ethnicity & Race   Ethnic Group Patient Race  Not Hispanic or Latino White or Caucasian  Documents Filed to Patient   Power of Attorney Living Will Clinical Unknown Study Attachment Consent Form ABN Waiver After Visit Summary Lab Result Scan Code Status MyChart Status Advance Care Planning  Not on File Not on File Not on File Not on File Not on File Not on File Filed Filed DNR [Updated on 10/17/15 1956] Pending Jump to the Activity  Admission Information   Attending Provider Admitting Provider Admission Type Admission Date/Time  Verlee Monte, MD Karmen Bongo, MD Emergency 10/17/15 1207  Discharge Date Hospital Service Auth/Cert Status Service Area   Internal Medicine Incomplete [place]  Unit Room/Bed Admission Status   AP-DEPT 300 A321/A321-01 Admission (Confirmed)   Hospital Account   Name Acct ID Class Status Primary Coverage  Rachel Morgan, Rachel Morgan [id number] Inpatient [place]      Guarantor Account (for Hospital Account [id number])   Name Relation to [place]? Acct Type  Rachel Morgan Self CHSA Yes Personal/Family  Address Phone    [address] [place], [place] [address] [phone])        Coverage Information (for Hospital Account [id number])   1. [facility] MEDICARE/UHC MEDICARE   F/O Payor/Plan Precert #  [facility] [place] Subscriber #  Rachel Morgan, Rachel Morgan [id number]  Address [place]  [address] [place] [address]  [phone]  2. MEDICAID [place]/MEDICAID [place] ACCESS   F/O Payor/Plan Precert #  MEDICAID [place]/MEDICAID [place] ACCESS   Subscriber Subscriber #  Rachel Morgan, Rachel Morgan [id number]  Address Phone  [address] [address] [place], [place] [address] [phone]

## 2015-10-21 LAB — BASIC METABOLIC PANEL
ANION GAP: 4 — AB (ref 5–15)
BUN: 27 mg/dL — ABNORMAL HIGH (ref 6–20)
CALCIUM: 8.3 mg/dL — AB (ref 8.9–10.3)
CO2: 24 mmol/L (ref 22–32)
Chloride: 110 mmol/L (ref 101–111)
Creatinine, Ser: 1.64 mg/dL — ABNORMAL HIGH (ref 0.44–1.00)
GFR, EST AFRICAN AMERICAN: 37 mL/min — AB (ref >60)
GFR, EST NON AFRICAN AMERICAN: 32 mL/min — AB (ref >60)
GLUCOSE: 102 mg/dL — AB (ref 65–99)
POTASSIUM: 5.5 mmol/L — AB (ref 3.5–5.1)
Sodium: 138 mmol/L (ref 135–145)

## 2015-10-21 MED ORDER — SODIUM POLYSTYRENE SULFONATE 15 GM/60ML PO SUSP
30.0000 g | Freq: Once | ORAL | Status: AC
  Administered 2015-10-21: 30 g via ORAL
  Filled 2015-10-21: qty 120

## 2015-10-21 MED ORDER — SODIUM CHLORIDE 0.9 % IV SOLN
1.0000 g | INTRAVENOUS | Status: DC
  Administered 2015-10-21: 1 g via INTRAVENOUS
  Filled 2015-10-21 (×3): qty 1

## 2015-10-21 MED ORDER — ERTAPENEM SODIUM 1 G IJ SOLR
1.0000 g | INTRAMUSCULAR | 12 refills | Status: DC
Start: 2015-10-21 — End: 2015-11-10

## 2015-10-21 NOTE — Progress Notes (Addendum)
CM faxed paperwork to [facility]/noted on order attention [facility] pharmacy. Received call from [facility] representative regarding IV/antibiotic.  Confirmed nurse from [facility] to do the visit and arranged for Sunday to visit with patient.

## 2015-10-21 NOTE — Discharge Summary (Signed)
Physician Discharge Summary  Rachel Morgan:[id number] DOB: September 21, 1950 DOA: 10/17/2015  PCP: [facility] date: 10/17/2015 Discharge date: 10/21/2015  Admitted From: Home Disposition: Home  Recommendations for Outpatient Follow-up:  1. Follow up with PCP in 1-2 weeks 2. Please obtain BMP/CBC in one week, Follow potassium. 3. Continue Invanz for 12 more days.  Home Health: [facility] RN/PT Equipment/Devices: PICC and Invanz administration.  Discharge Condition: Stable CODE STATUS: DNR/DNI Diet recommendation: Heart Healthy   Brief/Interim Summary: Rachel Morgan is a 65 y.o. female with medical history significant of  cerebral palsy/intellectual disability as well as hypothyroidism, nephrolithiasis with recurrent UTIs, DM, and CKD.  The patient is currently alert but unable to effectively communicate.  Her sister reports that she has been fatigued, not eating.  She also fell yesterday.  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 Jeffie Pollock on 10/17.   The patient has been unable to communicate, staring off into space.  Normally able to watch TV, play with nephew, carry on conversation.  She has been complaining of RLQ/flank pain.  Her sister thinks she may have dysuria, possibly mild hematuria, and there is a bad odor to the urine.  Fever to 101 Saturday.  Discharge Diagnoses:  Principal Problem:   Encephalopathy Active Problems:   Acute pyelonephritis   Acute renal failure (HCC)   Hypertension   Depression with anxiety   CKD (chronic kidney disease), stage III   Nephrolithiasis   Cerebral palsy (HCC)   Goals of care, counseling/discussion   Palliative care encounter   DNR (do not resuscitate) discussion   ESBL Escherichia coli UTI/and probable acute right-sided pyelonephritis -Patient with reported c/o urinary  symptoms as well as right-sided flank pain -Suspect right-sided pyelonephritis because of the right-sided flank pain. -Renal ultrasound did not show convincing hydronephrosis.  -Initially was on Rocephin, urine grew Escherichia coli which identified as ESBL. Started on Primaxin. -On discharge Invanz for 12 more days to complete 14 days of IV antibiotics. -Patient has PICC line, home health service to administer ertapenem.  ARF on CKD -Baseline creatinine appears to be 1.2-1.3, admitted with creatinine of 4.15. -Hold nephrotoxic agents including Motrin and diuretics -Check renal ultrasound to rule out hydronephrosis. Has bilateral ureteral stents, no obstruction per ultrasound. -This is improved with IV fluid hydration, creatinine is 1.6 at the time of discharge.  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  Nephrolithiasis -Has left proximal ureteral 14 mm stone with plan for left ureteroscopy by Dr. Jeffie Pollock at [facility] on 10/24/15. -Also has non-obstructing 6 mm right renal stone -JJ stents placed 9/19 due to urosepsis -This is discussed with Dr. Junious Silk on 10/20/15, he recommended antibiotics. -Initial plans is to do ureteroscopy on the 11/17, Dr. Junious Silk recommended Mrs. to be postponed for patient to get more antibiotics. This will be discussed with his colleagues and [facility] called the patient and her sister to let them know about the new timing for the ureteroscopy.  Depression with anxiety -She takes a number of psychotropic medications -Will continue all home medications -  TSH is normal at 3.259, continue Synthroid at home dose.  Goals of care -Discussion held with sister  regarding her goals for the patient -Their parents died difficult deaths fairly recently (cancer, Alzheimer's) and the sister would not want to go through again -She has asked that the patient be DNR -Palliative medicine team consulted for goals of care.  Constipation -No bowel movement in 3-4 days, given MiraLAX and milk of magnesia, had good bowel movement prior to discharge.  Hyperkalemia, mild -Potassium was in the higher side, the day of discharge was 5.5. -Patient has been on spironolactone and high-dose Bactrim, both discontinued, given calculated on day of discharge.  Discharge Instructions  Discharge Instructions    Diet - low sodium heart healthy    Complete by:  As directed    Increase activity slowly    Complete by:  As directed        Medication List    STOP taking these medications   spironolactone 50 MG tablet Commonly known as:  ALDACTONE   sulfamethoxazole-trimethoprim 800-160 MG tablet Commonly known as:  BACTRIM DS,SEPTRA DS     TAKE these medications   ALPRAZolam 1 MG tablet Commonly known as:  XANAX Take 1 mg by mouth 2 (two) times daily.   aspirin EC 81 MG tablet Take 81 mg by mouth every morning.   busPIRone 10 MG tablet Commonly known as:  BUSPAR Take 10 mg by mouth every morning.   chlorproMAZINE 25 MG tablet Commonly known as:  THORAZINE Take 25 mg by mouth at bedtime.   ertapenem 1 g in sodium chloride 0.9 % 50 mL Inject 1 g into the vein daily.   escitalopram 20 MG tablet Commonly known as:  LEXAPRO Take 20 mg by mouth at bedtime.   ibuprofen 200 MG tablet Commonly known as:  ADVIL,MOTRIN Take 400 mg by mouth every 6 (six) hours as needed for moderate pain.   levothyroxine 50 MCG tablet Commonly known as:  SYNTHROID, LEVOTHROID Take 50 mcg by mouth daily before breakfast.   OLANZapine 20 MG tablet Commonly known as:  ZYPREXA Take 20 mg by mouth daily.   omeprazole 20 MG capsule Commonly known as:  PRILOSEC Take 20 mg by  mouth every morning.   oxybutynin 10 MG 24 hr tablet Commonly known as:  DITROPAN-XL Take 10 mg by mouth every morning.   traMADol 50 MG tablet Commonly known as:  ULTRAM Take 50 mg by mouth every 6 (six) hours as needed for severe pain.   zolpidem 10 MG tablet Commonly known as:  AMBIEN Take 10 mg by mouth at bedtime.       Allergies  Allergen Reactions  . Macrobid [Nitrofurantoin Monohyd Macro] Hives and Swelling  . Penicillins Swelling and Rash    Has patient had a PCN reaction causing immediate rash, facial/tongue/throat swelling, SOB or lightheadedness with hypotension: Yes Has patient had a PCN reaction causing severe rash involving mucus membranes or skin necrosis: Yes Has patient had a PCN reaction that required hospitalization: no Has patient had a PCN reaction occurring within the last 10 years: no If all of the above answers are "NO", then may proceed with Cephalosporin use.     Consultations:  Discussed with urology over the phone   Procedures/Studies: Dg Chest 2 View  Result Date: 09/26/2015 CLINICAL DATA:  Shortness of breath and abdominal pain EXAM: CHEST  2 VIEW COMPARISON:  02/12/2011 FINDINGS: Chronic cardiomegaly. Stable mediastinal contours. Low volume chest. There is no edema, consolidation, effusion,  or pneumothorax. No acute osseous finding. IMPRESSION: No acute finding. Electronically Signed   By: Monte Fantasia M.D.   On: 09/26/2015 11:46   Ct Head Wo Contrast  Result Date: 10/17/2015 CLINICAL DATA:  Altered mental status for 4 days. Decreased appetite, fever EXAM: CT HEAD WITHOUT CONTRAST TECHNIQUE: Contiguous axial images were obtained from the base of the skull through the vertex without intravenous contrast. COMPARISON:  09/26/2015 FINDINGS: Brain: Diffuse cerebral atrophy. Atrophy is more pronounced in the frontal lobes bilaterally further old infarcts with encephalomalacia. No hydrocephalus or acute infarction. No hemorrhage. Vascular: No  hyperdense vessel or unexpected calcification. Skull: No acute calvarial abnormality Sinuses/Orbits: Visualized paranasal sinuses and mastoids clear. Orbital soft tissues unremarkable. Other: None IMPRESSION: Diffuse atrophy, most pronounced in the frontal lobes were there is encephalomalacia. No acute intracranial abnormality. Electronically Signed   By: Rolm Baptise M.D.   On: 10/17/2015 15:08   Ct Head Wo Contrast  Result Date: 09/26/2015 CLINICAL DATA:  FALL TODAY FELL FORWARD, FEVER, CONFUSION, HX DM EXAM: CT HEAD WITHOUT CONTRAST CT CERVICAL SPINE WITHOUT CONTRAST TECHNIQUE: Multidetector CT imaging of the head and cervical spine was performed following the standard protocol without intravenous contrast. Multiplanar CT image reconstructions of the cervical spine were also generated. COMPARISON:  None. FINDINGS: CT HEAD FINDINGS Brain: The ventricles are normal configuration. There is ventricular sulcal enlargement reflecting mild atrophy, somewhat greater than generally seen in a patient of this age. There is no hydrocephalus. There are no parenchymal masses or mass effect. 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 There is no evidence of a recent cortical infarct. Other areas of white matter hypoattenuation noted consistent with mild chronic microvascular ischemic change. There are no extra-axial masses or abnormal fluid collections. There is no intracranial hemorrhage. Vascular: No hyperdense vessel or unexpected calcification. Skull: Normal. Negative for fracture or focal lesion. Sinuses/Orbits: Visualized orbits are unremarkable. Visualized sinuses and mastoid air cells are clear. Other: None CT CERVICAL SPINE FINDINGS Alignment: Neck is held in flexion. Allowing for this, there is normal vertebral body alignment. No spondylolisthesis. Skull base and vertebrae: No fractures. There is an area sclerosis in the right clivus, which is  nonspecific. No osteolytic lesions. Soft tissues and spinal canal: No spinal canal mass or hematoma. No soft tissue neck masses or enlarged lymph nodes. Disc levels: Mild loss of disc height at C2-C3. Moderate to marked loss of disc height from C3-C4 through T1-T2. There are endplate sclerosis and osteophytes. Mild neural foraminal narrowing noted on the right at C3-C4 from uncovertebral spurring. No other significant stenosis. Upper chest: Clear visualized upper lungs. Other: None IMPRESSION: HEAD CT: No acute intracranial abnormalities. No skull fracture. Small focus of sclerosis in the right clivus, which is nonspecific. Although likely benign, neoplastic disease is not excluded. Recommend either follow-up MRI with and without contrast versus follow-up head CT in 3-4 months to document stability. CERVICAL CT: No fracture or acute finding. Significant degenerative changes as described. Electronically Signed   By: Lajean Manes M.D.   On: 09/26/2015 15:21   Ct Cervical Spine Wo Contrast  Result Date: 09/26/2015 CLINICAL DATA:  FALL TODAY FELL FORWARD, FEVER, CONFUSION, HX DM EXAM: CT HEAD WITHOUT CONTRAST CT CERVICAL SPINE WITHOUT CONTRAST TECHNIQUE: Multidetector CT imaging of the head and cervical spine was performed following the standard protocol without intravenous contrast. Multiplanar CT image reconstructions of the cervical spine were also generated. COMPARISON:  None. FINDINGS: CT HEAD  FINDINGS Brain: The ventricles are normal configuration. There is ventricular sulcal enlargement reflecting mild atrophy, somewhat greater than generally seen in a patient of this age. There is no hydrocephalus. There are no parenchymal masses or mass effect. 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 There is no evidence of a recent cortical infarct. Other areas of white matter hypoattenuation noted consistent with mild chronic microvascular ischemic  change. There are no extra-axial masses or abnormal fluid collections. There is no intracranial hemorrhage. Vascular: No hyperdense vessel or unexpected calcification. Skull: Normal. Negative for fracture or focal lesion. Sinuses/Orbits: Visualized orbits are unremarkable. Visualized sinuses and mastoid air cells are clear. Other: None CT CERVICAL SPINE FINDINGS Alignment: Neck is held in flexion. Allowing for this, there is normal vertebral body alignment. No spondylolisthesis. Skull base and vertebrae: No fractures. There is an area sclerosis in the right clivus, which is nonspecific. No osteolytic lesions. Soft tissues and spinal canal: No spinal canal mass or hematoma. No soft tissue neck masses or enlarged lymph nodes. Disc levels: Mild loss of disc height at C2-C3. Moderate to marked loss of disc height from C3-C4 through T1-T2. There are endplate sclerosis and osteophytes. Mild neural foraminal narrowing noted on the right at C3-C4 from uncovertebral spurring. No other significant stenosis. Upper chest: Clear visualized upper lungs. Other: None IMPRESSION: HEAD CT: No acute intracranial abnormalities. No skull fracture. Small focus of sclerosis in the right clivus, which is nonspecific. Although likely benign, neoplastic disease is not excluded. Recommend either follow-up MRI with and without contrast versus follow-up head CT in 3-4 months to document stability. CERVICAL CT: No fracture or acute finding. Significant degenerative changes as described. Electronically Signed   By: Lajean Manes M.D.   On: 09/26/2015 15:21   US Renal  Result Date: 10/18/2015 CLINICAL DATA:  Urinary tract infections with history of nephrolithiasis EXAM: RENAL / URINARY TRACT ULTRASOUND COMPLETE COMPARISON:  CT abdomen and pelvis September 26, 2015 FINDINGS: Right Kidney: Length: 11.0 cm. Echogenicity and renal cortical thickness are within normal limits. No mass, perinephric fluid, or hydronephrosis visualized. There is a  calculus in the upper to midportion of the right kidney measuring 7 mm. No ureterectasis evident. Left Kidney: Length: 11.1 cm. Echogenicity and renal cortical thickness are within normal limits. No mass or perinephric fluid visualized. There is mild fullness of the left renal collecting system/pelvicaliectasis. No ureterectasis evident. There is a calculus in the lower pole the left kidney measuring 9 mm. Bladder: Appears normal for degree of bladder distention. IMPRESSION: Bilateral intrarenal calculi. Mild pelvicaliectasis on the left. Question more distal obstruction on the left, although no ureterectasis is not evident by ultrasound on this study. Study otherwise unremarkable. Electronically Signed   By: Lowella Grip III M.D.   On: 10/18/2015 11:54   Dg Chest Port 1 View  Result Date: 10/17/2015 CLINICAL DATA:  Altered mental status EXAM: PORTABLE CHEST 1 VIEW COMPARISON:  09/26/2015 FINDINGS: The study is limited by patient's large body habitus and poor inspiration. Cardiomegaly again noted. Left basilar atelectasis. No gross infiltrate or pulmonary edema. IMPRESSION: Limited study as described above. Cardiomegaly. No gross infiltrate or pulmonary edema. Left basilar atelectasis. Electronically Signed   By: Lahoma Crocker M.D.   On: 10/17/2015 13:37   Ct Renal Stone Study  Result Date: 09/26/2015 CLINICAL DATA:  Fall today.  Fever and confusion. EXAM: CT ABDOMEN AND PELVIS WITHOUT CONTRAST TECHNIQUE: Multidetector CT imaging of the abdomen and  pelvis was performed following the standard protocol without IV contrast. COMPARISON:  09/14/2015 FINDINGS: Lower chest:  No contributory findings. Hepatobiliary: Partial nonvisualization of the right liver. No focal liver abnormality.Cholelithiasis. No evidence of acute cholecystitis. Pancreas: Generalized fatty atrophy.  No acute finding. Spleen: Unremarkable. Adrenals/Urinary Tract: Negative adrenals. Chronic left UPJ calculus and hydronephrosis with renal  cortical thinning. The UPJ stone measures up to 14 x 7 mm on coronal reformats. There is milk of calcium in the left-sided calices. Right nephrolithiasis with calculi measuring up to 8 mm. Asymmetric right perinephric stranding that is new from previous. The bladder is decompressed by Foley catheter, limiting assessment. Stomach/Bowel: No obstruction. No appendicitis. Mild colonic diverticulosis. Vascular/Lymphatic: No acute vascular abnormality. No mass or adenopathy. Reproductive:No acute finding Other: No ascites or pneumoperitoneum. Musculoskeletal: No acute abnormalities. Advanced facet arthropathy in the lower lumbar spine with grade 1 L4-5 anterolisthesis. Disc degeneration from L3-4 to L5-S1 and in the lower thoracic spine. IMPRESSION: 1. Right perinephric edema, possible pyelonephritis in this clinical setting. No right hydronephrosis. 2. Bilateral nephrolithiasis including chronic left UPJ calculus causing left hydronephrosis. Left cortical atrophy. 3. Cholelithiasis without signs of cholecystitis. 4. History of fall with no posttraumatic finding. Electronically Signed   By: Monte Fantasia M.D.   On: 09/26/2015 15:21    (Echo, Carotid, EGD, Colonoscopy, ERCP)    Subjective:   Discharge Exam: Vitals:   10/20/15 2132 10/21/15 0637  BP: 129/66 (!) 128/56  Pulse: 67 68  Resp: 18 18  Temp: 98.4 F (36.9 C) 98.6 F (37 C)   Vitals:   10/20/15 0635 10/20/15 1640 10/20/15 2132 10/21/15 0637  BP: (!) 120/48 (!) 103/53 129/66 (!) 128/56  Pulse: 60  67 68  Resp: 20 18 18 18   Temp: 97.5 F (36.4 C) 98 F (36.7 C) 98.4 F (36.9 C) 98.6 F (37 C)  TempSrc: Oral Oral Oral Oral  SpO2: 96% 100% 98% 96%  Weight:      Height:        General: Pt is alert, awake, not in acute distress Cardiovascular: RRR, S1/S2 +, no rubs, no gallops Respiratory: CTA bilaterally, no wheezing, no rhonchi Abdominal: Soft, NT, ND, bowel sounds + Extremities: no edema, no cyanosis    The results of  significant diagnostics from this hospitalization (including imaging, microbiology, ancillary and laboratory) are listed below for reference.     Microbiology: Recent Results (from the past 240 hour(s))  Urine culture     Status: Abnormal   Collection Time: 10/17/15  1:55 PM  Result Value Ref Range Status   Specimen Description URINE, CATHETERIZED  Final   Special Requests NONE  Final   Culture (A)  Final    10,000 COLONIES/mL ESCHERICHIA COLI Confirmed Extended Spectrum Beta-Lactamase Producer (ESBL) Performed at [facility]    Report Status 10/19/2015 FINAL  Final   Organism ID, Bacteria ESCHERICHIA COLI (A)  Final      Susceptibility   Escherichia coli - MIC*    AMPICILLIN >=32 RESISTANT Resistant     CEFAZOLIN >=64 RESISTANT Resistant     CEFTRIAXONE >=64 RESISTANT Resistant     CIPROFLOXACIN >=4 RESISTANT Resistant     GENTAMICIN >=16 RESISTANT Resistant     IMIPENEM <=0.25 SENSITIVE Sensitive     NITROFURANTOIN <=16 SENSITIVE Sensitive     TRIMETH/SULFA <=20 SENSITIVE Sensitive     AMPICILLIN/SULBACTAM >=32 RESISTANT Resistant     PIP/TAZO <=4 SENSITIVE Sensitive     Extended ESBL POSITIVE Resistant     *  10,000 COLONIES/mL ESCHERICHIA COLI     Labs: BNP (last 3 results) No results for input(s): BNP in the last 8760 hours. Basic Metabolic Panel:  Recent Labs Lab 10/17/15 1227 10/18/15 0553 10/19/15 0907 10/20/15 0609 10/21/15 0644  NA 139 141 135 137 138  K 5.1 4.9 5.0 5.1 5.5*  CL 111 112* 107 112* 110  CO2 22 22 22 22 24   GLUCOSE 166* 107* 148* 114* 102*  BUN 70* 65* 43* 34* 27*  CREATININE 4.15* 3.43* 2.35* 1.87* 1.64*  CALCIUM 8.3* 8.2* 8.2* 7.6* 8.3*  MG  --   --   --  1.5*  --    Liver Function Tests:  Recent Labs Lab 10/17/15 1227  AST 28  ALT 18  ALKPHOS 79  BILITOT 0.6  PROT 8.2*  ALBUMIN 3.1*   No results for input(s): LIPASE, AMYLASE in the last 168 hours. No results for input(s): AMMONIA in the last 168  hours. CBC:  Recent Labs Lab 10/17/15 1227 10/18/15 0553 10/20/15 0609  WBC 28.8* 18.5* 7.5  HGB 12.3 10.6* 10.2*  HCT 36.6 33.1* 32.3*  MCV 89.3 89.7 91.2  PLT 231 221 182   Cardiac Enzymes: No results for input(s): CKTOTAL, CKMB, CKMBINDEX, TROPONINI in the last 168 hours. BNP: Invalid input(s): POCBNP CBG:  Recent Labs Lab 10/17/15 1216  GLUCAP 176*   D-Dimer No results for input(s): DDIMER in the last 72 hours. Hgb A1c No results for input(s): HGBA1C in the last 72 hours. Lipid Profile No results for input(s): CHOL, HDL, LDLCALC, TRIG, CHOLHDL, LDLDIRECT in the last 72 hours. Thyroid function studies No results for input(s): TSH, T4TOTAL, T3FREE, THYROIDAB in the last 72 hours.  Invalid input(s): FREET3 Anemia work up No results for input(s): VITAMINB12, FOLATE, FERRITIN, TIBC, IRON, RETICCTPCT in the last 72 hours. Urinalysis    Component Value Date/Time   COLORURINE BROWN (A) 10/17/2015 1355   APPEARANCEUR CLOUDY (A) 10/17/2015 1355   LABSPEC 1.020 10/17/2015 1355   PHURINE 6.0 10/17/2015 1355   GLUCOSEU NEGATIVE 10/17/2015 1355   HGBUR LARGE (A) 10/17/2015 1355   BILIRUBINUR MODERATE (A) 10/17/2015 1355   KETONESUR NEGATIVE 10/17/2015 1355   PROTEINUR >300 (A) 10/17/2015 1355   UROBILINOGEN 1.0 02/11/2011 2035   NITRITE POSITIVE (A) 10/17/2015 1355   LEUKOCYTESUR LARGE (A) 10/17/2015 1355   Sepsis Labs Invalid input(s): PROCALCITONIN,  WBC,  LACTICIDVEN Microbiology Recent Results (from the past 240 hour(s))  Urine culture     Status: Abnormal   Collection Time: 10/17/15  1:55 PM  Result Value Ref Range Status   Specimen Description URINE, CATHETERIZED  Final   Special Requests NONE  Final   Culture (A)  Final    10,000 COLONIES/mL ESCHERICHIA COLI Confirmed Extended Spectrum Beta-Lactamase Producer (ESBL) Performed at [facility]    Report Status 10/19/2015 FINAL  Final   Organism ID, Bacteria ESCHERICHIA COLI (A)  Final       Susceptibility   Escherichia coli - MIC*    AMPICILLIN >=32 RESISTANT Resistant     CEFAZOLIN >=64 RESISTANT Resistant     CEFTRIAXONE >=64 RESISTANT Resistant     CIPROFLOXACIN >=4 RESISTANT Resistant     GENTAMICIN >=16 RESISTANT Resistant     IMIPENEM <=0.25 SENSITIVE Sensitive     NITROFURANTOIN <=16 SENSITIVE Sensitive     TRIMETH/SULFA <=20 SENSITIVE Sensitive     AMPICILLIN/SULBACTAM >=32 RESISTANT Resistant     PIP/TAZO <=4 SENSITIVE Sensitive     Extended ESBL POSITIVE Resistant     *  10,000 COLONIES/mL ESCHERICHIA COLI     Time coordinating discharge: Over 30 minutes  SIGNED:   Birdie Hopes, MD  [facility] 10/21/2015, 8:33 AM Pager   If 7PM-7AM, please contact night-coverage [email or web address] Password [email or web address]

## 2015-10-21 NOTE — Progress Notes (Signed)
Pt. Discharged home via EMS.

## 2015-10-26 ENCOUNTER — Encounter (HOSPITAL_COMMUNITY): Payer: Self-pay | Admitting: *Deleted

## 2015-10-26 NOTE — Progress Notes (Signed)
LVVM with pharmacy call center of same day status and meds havent been reviewed

## 2015-10-26 NOTE — Progress Notes (Signed)
Spoke with sister around 1100-stated labs will be drawn today. Asked her to obtain nurse phone number and name.  Just spoke with sister- nurse just arrived

## 2015-10-27 NOTE — Progress Notes (Signed)
Spoke with Home Health Nurse, Almyra Free earlier this am- states she drew cbc with diff, cmp yesterday and will have office fax me results.  I received cmp and faxed to dr Jeffie Pollock with confirmation.  As of this time, I still have not received cbc with diff report-  Spoke with Almyra Free again who stated she would check her email and havesen tsent to [place]. Spoke with [place] who verified that meds have been completed

## 2015-10-30 NOTE — H&P (Signed)
Consult Note Date of Service: 09/26/2015 4:10 PM [name], MD  Urology  Expand All Collapse All   [] Hide copied text [] Hover for attribution information Reason for Consult: Left Ureteral, Right Renal Stones, Urosepsis  Referring Physician: Ezequiel Essex MD  Rachel Morgan is an 65 y.o. female.   HPI:   1 - Left Ureteral, Right Renal Stones - left prox ureteral 101mm stoen by CT x several 2017, tentativley planning on left ureteroscopy by Dr. Jeffie Pollock at [facility] 10/24/15. Also non-obstrucitng approx 17mm Rt renal stone. Remains present by imaging today on 09/26/15.  2 - Urosepsis - fevers, tachycardia, mental status changes with bacteruria c/w urosepsis on presentation to ER 09/2015. No lactic acidosis. UCX, BCX pending. Received empirit aztreonam, vanc, levaquin in ER. Left sided obstructing stone as per above.  Today "Minela" is seen as urgent consult for above. Her sister "[name]" who is HCPOA is with her.       Past Medical History:  Diagnosis Date  . Anxiety   . Diabetes mellitus   . Gall stones   . High cholesterol   . Hypertension   . Kidney stones   . Kidney stones   . MR (mental retardation)   . Thyroid disease     History reviewed. No pertinent surgical history.  History reviewed. No pertinent family history.  Social History:  reports that she has never smoked. She has never used smokeless tobacco. She reports that she does not drink alcohol or use drugs.  Allergies:      Allergies  Allergen Reactions  . Macrobid [Nitrofurantoin [employer]  . Penicillins     Medications: I have reviewed the patient's current medications.  LabResultsLast48Hours        Results for orders placed or performed during the hospital encounter of 09/26/15 (from the past 48 hour(s))  Urinalysis, Routine w reflex microscopic (not at [facility])     Status: Abnormal   Collection Time: 09/26/15 11:25 AM  Result Value Ref Range   Color,  Urine AMBER (A) YELLOW    Comment: BIOCHEMICALS MAY BE AFFECTED BY COLOR   APPearance HAZY (A) CLEAR   Specific Gravity, Urine 1.020 1.005 - 1.030   pH 6.0 5.0 - 8.0   Glucose, UA NEGATIVE NEGATIVE mg/dL   Hgb urine dipstick LARGE (A) NEGATIVE   Bilirubin Urine SMALL (A) NEGATIVE   Ketones, ur NEGATIVE NEGATIVE mg/dL   Protein, ur 100 (A) NEGATIVE mg/dL   Nitrite POSITIVE (A) NEGATIVE   Leukocytes, UA MODERATE (A) NEGATIVE  Urine microscopic-add on     Status: Abnormal   Collection Time: 09/26/15 11:25 AM  Result Value Ref Range   Squamous Epithelial / LPF 0-5 (A) NONE SEEN   WBC, UA TOO NUMEROUS TO COUNT 0 - 5 WBC/hpf   RBC / HPF TOO NUMEROUS TO COUNT 0 - 5 RBC/hpf   Bacteria, UA MANY (A) NONE SEEN  I-Stat CG4 Lactic Acid, ED  (not at  [facility])     Status: None   Collection Time: 09/26/15  1:40 PM  Result Value Ref Range   Lactic Acid, Venous 1.69 0.5 - 1.9 mmol/L       ImagingResults(Last48hours)  Dg Chest 2 View  Result Date: 09/26/2015 CLINICAL DATA:  Shortness of breath and abdominal pain EXAM: CHEST  2 VIEW COMPARISON:  02/12/2011 FINDINGS: Chronic cardiomegaly. Stable mediastinal contours. Low volume chest. There is no edema, consolidation, effusion, or pneumothorax. No acute osseous finding. IMPRESSION: No acute finding. Electronically Signed  By: Monte Fantasia M.D.   On: 09/26/2015 11:46   Ct Head Wo Contrast  Result Date: 09/26/2015 CLINICAL DATA:  FALL TODAY FELL FORWARD, FEVER, CONFUSION, HX DM EXAM: CT HEAD WITHOUT CONTRAST CT CERVICAL SPINE WITHOUT CONTRAST TECHNIQUE: Multidetector CT imaging of the head and cervical spine was performed following the standard protocol without intravenous contrast. Multiplanar CT image reconstructions of the cervical spine were also generated. COMPARISON:  None. FINDINGS: CT HEAD FINDINGS Brain: The ventricles are normal configuration. There is ventricular sulcal enlargement reflecting mild atrophy, somewhat  greater than generally seen in a patient of this age. There is no hydrocephalus. There are no parenchymal masses or mass effect. 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 There is no evidence of a recent cortical infarct. Other areas of white matter hypoattenuation noted consistent with mild chronic microvascular ischemic change. There are no extra-axial masses or abnormal fluid collections. There is no intracranial hemorrhage. Vascular: No hyperdense vessel or unexpected calcification. Skull: Normal. Negative for fracture or focal lesion. Sinuses/Orbits: Visualized orbits are unremarkable. Visualized sinuses and mastoid air cells are clear. Other: None CT CERVICAL SPINE FINDINGS Alignment: Neck is held in flexion. Allowing for this, there is normal vertebral body alignment. No spondylolisthesis. Skull base and vertebrae: No fractures. There is an area sclerosis in the right clivus, which is nonspecific. No osteolytic lesions. Soft tissues and spinal canal: No spinal canal mass or hematoma. No soft tissue neck masses or enlarged lymph nodes. Disc levels: Mild loss of disc height at C2-C3. Moderate to marked loss of disc height from C3-C4 through T1-T2. There are endplate sclerosis and osteophytes. Mild neural foraminal narrowing noted on the right at C3-C4 from uncovertebral spurring. No other significant stenosis. Upper chest: Clear visualized upper lungs. Other: None IMPRESSION: HEAD CT: No acute intracranial abnormalities. No skull fracture. Small focus of sclerosis in the right clivus, which is nonspecific. Although likely benign, neoplastic disease is not excluded. Recommend either follow-up MRI with and without contrast versus follow-up head CT in 3-4 months to document stability. CERVICAL CT: No fracture or acute finding. Significant degenerative changes as described. Electronically Signed   By: Lajean Manes M.D.   On: 09/26/2015 15:21   Ct  Cervical Spine Wo Contrast  Result Date: 09/26/2015 CLINICAL DATA:  FALL TODAY FELL FORWARD, FEVER, CONFUSION, HX DM EXAM: CT HEAD WITHOUT CONTRAST CT CERVICAL SPINE WITHOUT CONTRAST TECHNIQUE: Multidetector CT imaging of the head and cervical spine was performed following the standard protocol without intravenous contrast. Multiplanar CT image reconstructions of the cervical spine were also generated. COMPARISON:  None. FINDINGS: CT HEAD FINDINGS Brain: The ventricles are normal configuration. There is ventricular sulcal enlargement reflecting mild atrophy, somewhat greater than generally seen in a patient of this age. There is no hydrocephalus. There are no parenchymal masses or mass effect. 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 There is no evidence of a recent cortical infarct. Other areas of white matter hypoattenuation noted consistent with mild chronic microvascular ischemic change. There are no extra-axial masses or abnormal fluid collections. There is no intracranial hemorrhage. Vascular: No hyperdense vessel or unexpected calcification. Skull: Normal. Negative for fracture or focal lesion. Sinuses/Orbits: Visualized orbits are unremarkable. Visualized sinuses and mastoid air cells are clear. Other: None CT CERVICAL SPINE FINDINGS Alignment: Neck is held in flexion. Allowing for this, there is normal vertebral body alignment. No spondylolisthesis.  Skull base and vertebrae: No fractures. There is an area sclerosis in the right clivus, which is nonspecific. No osteolytic lesions. Soft tissues and spinal canal: No spinal canal mass or hematoma. No soft tissue neck masses or enlarged lymph nodes. Disc levels: Mild loss of disc height at C2-C3. Moderate to marked loss of disc height from C3-C4 through T1-T2. There are endplate sclerosis and osteophytes. Mild neural foraminal narrowing noted on the right at C3-C4 from uncovertebral spurring.  No other significant stenosis. Upper chest: Clear visualized upper lungs. Other: None IMPRESSION: HEAD CT: No acute intracranial abnormalities. No skull fracture. Small focus of sclerosis in the right clivus, which is nonspecific. Although likely benign, neoplastic disease is not excluded. Recommend either follow-up MRI with and without contrast versus follow-up head CT in 3-4 months to document stability. CERVICAL CT: No fracture or acute finding. Significant degenerative changes as described. Electronically Signed   By: Lajean Manes M.D.   On: 09/26/2015 15:21   Ct Renal Stone Study  Result Date: 09/26/2015 CLINICAL DATA:  Fall today.  Fever and confusion. EXAM: CT ABDOMEN AND PELVIS WITHOUT CONTRAST TECHNIQUE: Multidetector CT imaging of the abdomen and pelvis was performed following the standard protocol without IV contrast. COMPARISON:  09/14/2015 FINDINGS: Lower chest:  No contributory findings. Hepatobiliary: Partial nonvisualization of the right liver. No focal liver abnormality.Cholelithiasis. No evidence of acute cholecystitis. Pancreas: Generalized fatty atrophy.  No acute finding. Spleen: Unremarkable. Adrenals/Urinary Tract: Negative adrenals. Chronic left UPJ calculus and hydronephrosis with renal cortical thinning. The UPJ stone measures up to 14 x 7 mm on coronal reformats. There is milk of calcium in the left-sided calices. Right nephrolithiasis with calculi measuring up to 8 mm. Asymmetric right perinephric stranding that is new from previous. The bladder is decompressed by Foley catheter, limiting assessment. Stomach/Bowel: No obstruction. No appendicitis. Mild colonic diverticulosis. Vascular/Lymphatic: No acute vascular abnormality. No mass or adenopathy. Reproductive:No acute finding Other: No ascites or pneumoperitoneum. Musculoskeletal: No acute abnormalities. Advanced facet arthropathy in the lower lumbar spine with grade 1 L4-5 anterolisthesis. Disc degeneration from L3-4 to L5-S1  and in the lower thoracic spine. IMPRESSION: 1. Right perinephric edema, possible pyelonephritis in this clinical setting. No right hydronephrosis. 2. Bilateral nephrolithiasis including chronic left UPJ calculus causing left hydronephrosis. Left cortical atrophy. 3. Cholelithiasis without signs of cholecystitis. 4. History of fall with no posttraumatic finding. Electronically Signed   By: Monte Fantasia M.D.   On: 09/26/2015 15:21     Review of Systems  Constitutional: Positive for chills, fever and malaise/fatigue.  HENT: Negative.   Eyes: Negative.   Respiratory: Negative.   Cardiovascular: Negative.   Gastrointestinal: Negative.   Genitourinary: Positive for dysuria and flank pain.  Skin: Negative.   Neurological: Negative.   Endo/Heme/Allergies: Negative.   Psychiatric/Behavioral: Negative.    Blood pressure 117/57, pulse 78, temperature 100.5 F (38.1 C), temperature source Rectal, resp. rate 23, height 5' (1.524 m), weight 119.7 kg (264 lb), SpO2 97 %. Physical Exam  Constitutional: She appears well-developed.  Stigmata of mild mental retardation, very pleasant and cooperative. Pt's sister present.   HENT:  Head: Normocephalic.  Eyes: Pupils are equal, round, and reactive to light.  Cardiovascular: Normal rate.   Respiratory: Effort normal.  GI:  Moderate truncal obesity.   Genitourinary:  Genitourinary Comments: No CVAT at present  Musculoskeletal: Normal range of motion.  Neurological: She is alert.  Skin: Skin is warm.    Assessment/Plan:  1 - Left Ureteral, Right Renal Stones - current  picture of urosepsis complicates. Rec urgent renal decompression with bilateral JJ stents. This will also allow for some passive ureteral dilation to make upcoming ureteroscopy next month safer.  Risks, benefits, alternatives (no treatment, nephrostomy), expected peri-op course discussed. Frankly discussed that this will not directly address stone, but goal is to allow complete  clearance of infection prior to stone surgery which can hopefully proceed as planned next month. Pt and POA voiced understanding and desire to proceed.   2 - Urosepsis - renal decompression for source control as per above. Agree with current ABX, expecially aztreonam, awaiting further CX data.   [name] 09/26/2015, 4:10 PM        Electronically signed by [name], MD at 09/26/2015 8:24 PM      ED to Hosp-Admission (Discharged) on 09/26/2015        Routing History        Detailed Report

## 2015-10-30 NOTE — Progress Notes (Signed)
Spoke with sister, mikena masoner and patient will be coming via ambulance to hospital which she has arranged.  verta riedlinger also stated [name], RN instructed her on what meds to patient to take am of surgery.

## 2015-10-30 NOTE — Progress Notes (Signed)
CBC/DIFF done 10/26/15 faxed to Dr Jeffie Pollock.

## 2015-10-31 ENCOUNTER — Ambulatory Visit (HOSPITAL_COMMUNITY): Payer: Medicare Other | Admitting: Registered Nurse

## 2015-10-31 ENCOUNTER — Encounter (HOSPITAL_COMMUNITY)
Admission: RE | Disposition: A | Discharge status: Home / Self Care | Payer: Self-pay | Source: Ambulatory Visit | Attending: Urology

## 2015-10-31 ENCOUNTER — Encounter (HOSPITAL_COMMUNITY): Payer: Self-pay | Admitting: *Deleted

## 2015-10-31 ENCOUNTER — Ambulatory Visit (HOSPITAL_COMMUNITY): Payer: Medicare Other

## 2015-10-31 ENCOUNTER — Ambulatory Visit (HOSPITAL_COMMUNITY)
Admission: RE | Admit: 2015-10-31 | Discharge: 2015-10-31 | Disposition: A | Discharge status: Home / Self Care | Payer: Medicare Other | Source: Ambulatory Visit | Attending: Urology | Admitting: Urology

## 2015-10-31 DIAGNOSIS — E119 Type 2 diabetes mellitus without complications: Secondary | ICD-10-CM | POA: Insufficient documentation

## 2015-10-31 DIAGNOSIS — Z7982 Long term (current) use of aspirin: Secondary | ICD-10-CM | POA: Diagnosis not present

## 2015-10-31 DIAGNOSIS — I119 Hypertensive heart disease without heart failure: Secondary | ICD-10-CM | POA: Insufficient documentation

## 2015-10-31 DIAGNOSIS — Z791 Long term (current) use of non-steroidal anti-inflammatories (NSAID): Secondary | ICD-10-CM | POA: Insufficient documentation

## 2015-10-31 DIAGNOSIS — E039 Hypothyroidism, unspecified: Secondary | ICD-10-CM | POA: Diagnosis not present

## 2015-10-31 DIAGNOSIS — N132 Hydronephrosis with renal and ureteral calculous obstruction: Secondary | ICD-10-CM | POA: Diagnosis not present

## 2015-10-31 DIAGNOSIS — Z881 Allergy status to other antibiotic agents status: Secondary | ICD-10-CM | POA: Diagnosis not present

## 2015-10-31 DIAGNOSIS — F419 Anxiety disorder, unspecified: Secondary | ICD-10-CM | POA: Insufficient documentation

## 2015-10-31 DIAGNOSIS — E7800 Pure hypercholesterolemia, unspecified: Secondary | ICD-10-CM | POA: Diagnosis not present

## 2015-10-31 DIAGNOSIS — Z880 Allergy status to penicillin: Secondary | ICD-10-CM | POA: Insufficient documentation

## 2015-10-31 DIAGNOSIS — Z79899 Other long term (current) drug therapy: Secondary | ICD-10-CM | POA: Diagnosis not present

## 2015-10-31 DIAGNOSIS — G9389 Other specified disorders of brain: Secondary | ICD-10-CM | POA: Insufficient documentation

## 2015-10-31 DIAGNOSIS — K219 Gastro-esophageal reflux disease without esophagitis: Secondary | ICD-10-CM | POA: Insufficient documentation

## 2015-10-31 DIAGNOSIS — Z87442 Personal history of urinary calculi: Secondary | ICD-10-CM | POA: Diagnosis not present

## 2015-10-31 HISTORY — DX: Gastro-esophageal reflux disease without esophagitis: K21.9

## 2015-10-31 HISTORY — DX: Hypothyroidism, unspecified: E03.9

## 2015-10-31 HISTORY — DX: Tubulo-interstitial nephritis, not specified as acute or chronic: N12

## 2015-10-31 HISTORY — DX: Calculus of gallbladder without cholecystitis without obstruction: K80.20

## 2015-10-31 HISTORY — PX: HOLMIUM LASER APPLICATION: SHX5852

## 2015-10-31 HISTORY — DX: Sepsis, unspecified organism: A41.9

## 2015-10-31 HISTORY — DX: Personal history of urinary calculi: Z87.442

## 2015-10-31 HISTORY — DX: Cerebral palsy, unspecified: G80.9

## 2015-10-31 HISTORY — PX: CYSTOSCOPY/RETROGRADE/URETEROSCOPY/STONE EXTRACTION WITH BASKET: SHX5317

## 2015-10-31 HISTORY — DX: Chronic kidney disease, unspecified: N18.9

## 2015-10-31 LAB — GLUCOSE, CAPILLARY: GLUCOSE-CAPILLARY: 109 mg/dL — AB (ref 65–99)

## 2015-10-31 SURGERY — CYSTOSCOPY, WITH CALCULUS REMOVAL USING BASKET
Anesthesia: Choice

## 2015-10-31 MED ORDER — LIDOCAINE 2% (20 MG/ML) 5 ML SYRINGE
INTRAMUSCULAR | Status: AC
  Filled 2015-10-31: qty 5

## 2015-10-31 MED ORDER — OXYCODONE HCL 5 MG/5ML PO SOLN: 5.0000 mg | Freq: Once | ORAL | Status: DC | PRN

## 2015-10-31 MED ORDER — PROPOFOL 10 MG/ML IV BOLUS
INTRAVENOUS | Status: DC | PRN
  Administered 2015-10-31: 20 mg via INTRAVENOUS
  Administered 2015-10-31: 130 mg via INTRAVENOUS
  Administered 2015-10-31: 20 mg via INTRAVENOUS

## 2015-10-31 MED ORDER — OXYCODONE HCL 5 MG PO TABS: 5.0000 mg | ORAL_TABLET | Freq: Once | ORAL | Status: DC | PRN

## 2015-10-31 MED ORDER — HEPARIN SOD (PORK) LOCK FLUSH 100 UNIT/ML IV SOLN
250.0000 [IU] | INTRAVENOUS | Status: AC | PRN
  Administered 2015-10-31: 250 [IU]

## 2015-10-31 MED ORDER — ONDANSETRON HCL 4 MG/2ML IJ SOLN
INTRAMUSCULAR | Status: DC | PRN
  Administered 2015-10-31: 4 mg via INTRAVENOUS

## 2015-10-31 MED ORDER — SODIUM CHLORIDE 0.9 % IR SOLN
Status: DC | PRN
  Administered 2015-10-31: 6000 mL

## 2015-10-31 MED ORDER — FLUCONAZOLE 150 MG PO TABS: 150.0000 mg | ORAL_TABLET | ORAL | 0 refills | Status: AC

## 2015-10-31 MED ORDER — LIDOCAINE 2% (20 MG/ML) 5 ML SYRINGE
INTRAMUSCULAR | Status: DC | PRN
  Administered 2015-10-31: 20 mg via INTRAVENOUS

## 2015-10-31 MED ORDER — ROCURONIUM BROMIDE 100 MG/10ML IV SOLN
INTRAVENOUS | Status: DC | PRN
  Administered 2015-10-31: 40 mg via INTRAVENOUS

## 2015-10-31 MED ORDER — LACTATED RINGERS IV SOLN
INTRAVENOUS | Status: DC
  Administered 2015-10-31: 10:00:00 via INTRAVENOUS
  Administered 2015-10-31: 1000 mL via INTRAVENOUS
  Administered 2015-10-31: 13:00:00 via INTRAVENOUS

## 2015-10-31 MED ORDER — FENTANYL CITRATE (PF) 100 MCG/2ML IJ SOLN: 25.0000 ug | INTRAMUSCULAR | Status: DC | PRN

## 2015-10-31 MED ORDER — FENTANYL CITRATE (PF) 100 MCG/2ML IJ SOLN
INTRAMUSCULAR | Status: DC | PRN
  Administered 2015-10-31 (×2): 25 ug via INTRAVENOUS

## 2015-10-31 MED ORDER — CIPROFLOXACIN IN D5W 400 MG/200ML IV SOLN
INTRAVENOUS | Status: AC
  Filled 2015-10-31: qty 200

## 2015-10-31 MED ORDER — PROPOFOL 10 MG/ML IV BOLUS
INTRAVENOUS | Status: AC
  Filled 2015-10-31: qty 20

## 2015-10-31 MED ORDER — SUGAMMADEX SODIUM 200 MG/2ML IV SOLN
INTRAVENOUS | Status: DC | PRN
  Administered 2015-10-31: 250 mg via INTRAVENOUS

## 2015-10-31 MED ORDER — FENTANYL CITRATE (PF) 100 MCG/2ML IJ SOLN
INTRAMUSCULAR | Status: AC
  Filled 2015-10-31: qty 2

## 2015-10-31 MED ORDER — SUCCINYLCHOLINE CHLORIDE 20 MG/ML IJ SOLN
INTRAMUSCULAR | Status: AC
  Filled 2015-10-31: qty 1

## 2015-10-31 MED ORDER — TRAMADOL HCL 50 MG PO TABS: 50.0000 mg | ORAL_TABLET | Freq: Four times a day (QID) | ORAL | 0 refills | Status: DC | PRN

## 2015-10-31 MED ORDER — SODIUM CHLORIDE 0.9 % IV SOLN
1.0000 g | INTRAVENOUS | Status: AC
  Administered 2015-10-31: 1 g via INTRAVENOUS
  Filled 2015-10-31: qty 1

## 2015-10-31 MED ORDER — CIPROFLOXACIN IN D5W 400 MG/200ML IV SOLN: 400.0000 mg | INTRAVENOUS | Status: DC

## 2015-10-31 SURGICAL SUPPLY — 23 items
BAG URO CATCHER STRL LF (MISCELLANEOUS) ×4 IMPLANT
BASKET LASER NITINOL 1.9FR (BASKET) IMPLANT
BASKET STONE NCOMPASS (UROLOGICAL SUPPLIES) IMPLANT
CATH URET 5FR 28IN OPEN ENDED (CATHETERS) ×4 IMPLANT
CATH URET DUAL LUMEN 6-10FR 50 (CATHETERS) ×4 IMPLANT
CLOTH BEACON ORANGE TIMEOUT ST (SAFETY) ×4 IMPLANT
EXTRACTOR STONE NITINOL NGAGE (UROLOGICAL SUPPLIES) ×4 IMPLANT
FIBER LASER FLEXIVA 1000 (UROLOGICAL SUPPLIES) IMPLANT
FIBER LASER FLEXIVA 365 (UROLOGICAL SUPPLIES) IMPLANT
FIBER LASER FLEXIVA 550 (UROLOGICAL SUPPLIES) IMPLANT
FIBER LASER TRAC TIP (UROLOGICAL SUPPLIES) ×4 IMPLANT
GLOVE SURG SS PI 8.0 STRL IVOR (GLOVE) ×4 IMPLANT
GOWN STRL REUS W/TWL XL LVL3 (GOWN DISPOSABLE) ×4 IMPLANT
GUIDEWIRE STR DUAL SENSOR (WIRE) ×4 IMPLANT
IV NS 1000ML (IV SOLUTION) ×2
IV NS 1000ML BAXH (IV SOLUTION) ×2 IMPLANT
IV NS IRRIG 3000ML ARTHROMATIC (IV SOLUTION) ×4 IMPLANT
MANIFOLD NEPTUNE II (INSTRUMENTS) ×4 IMPLANT
PACK CYSTO (CUSTOM PROCEDURE TRAY) ×4 IMPLANT
SHEATH ACCESS URETERAL 38CM (SHEATH) ×8 IMPLANT
STENT URET 6FRX24 CONTOUR (STENTS) ×4 IMPLANT
TUBING CONNECTING 10 (TUBING) ×3 IMPLANT
TUBING CONNECTING 10' (TUBING) ×1

## 2015-10-31 NOTE — Anesthesia Procedure Notes (Signed)
Procedure Name: Intubation Date/Time: 10/31/2015 11:04 AM Performed by: Cynda Familia Pre-anesthesia Checklist: Patient identified, Emergency Drugs available, Suction available and Patient being monitored Patient Re-evaluated:Patient Re-evaluated prior to inductionOxygen Delivery Method: Circle System Utilized Preoxygenation: Pre-oxygenation with 100% oxygen Intubation Type: IV induction Ventilation: Mask ventilation without difficulty Laryngoscope Size: Miller and 2 Grade View: Grade I Tube type: Oral Number of attempts: 1 Airway Equipment and Method: Stylet Placement Confirmation: ETT inserted through vocal cords under direct vision,  positive ETCO2 and breath sounds checked- equal and bilateral Secured at: 22 cm Tube secured with: Tape Dental Injury: Teeth and Oropharynx as per pre-operative assessment  Comments: Smooth IV induction Moser---  Intubation AM CRNA atraumatic--- no teeth preop-- bilat BS

## 2015-10-31 NOTE — Interval H&P Note (Signed)
History and Physical Interval Note:  She is on Invanz for her recent sepsis.     10/31/2015 10:21 AM  Rachel Morgan  has presented today for surgery, with the diagnosis of left proximal stone  The various methods of treatment have been discussed with the patient and family. After consideration of risks, benefits and other options for treatment, the patient has consented to  Procedure(s): CYSTOSCOPY/LEFT RETROGRADE/LEFT URETEROSCOPY/STONE EXTRACTION/LEFT STENT PLACEMENT (Left) HOLMIUM LASER APPLICATION (N/A) as a surgical intervention .  The patient's history has been reviewed, patient examined, no change in status, stable for surgery.  I have reviewed the patient's chart and labs.  Questions were answered to the patient's satisfaction.     Candice Lunney J

## 2015-10-31 NOTE — Anesthesia Preprocedure Evaluation (Signed)
Anesthesia Evaluation  Patient identified by MRN, date of birth, ID band Patient awake    Reviewed: Allergy & Precautions, NPO status , Patient's Chart, lab work & pertinent test results  History of Anesthesia Complications Negative for: history of anesthetic complications  Airway Mallampati: II  TM Distance: >3 FB Neck ROM: Full    Dental  (+) Edentulous Upper, Edentulous Lower   Pulmonary neg pulmonary ROS,    breath sounds clear to auscultation       Cardiovascular hypertension,  Rhythm:Regular     Neuro/Psych PSYCHIATRIC DISORDERS Anxiety Cerebral palsy     GI/Hepatic GERD  Medicated and Controlled,  Endo/Other  diabetes, Type 2Hypothyroidism   Renal/GU Renal disease     Musculoskeletal   Abdominal   Peds  Hematology   Anesthesia Other Findings   Reproductive/Obstetrics                             Anesthesia Physical Anesthesia Plan  ASA: III  Anesthesia Plan: General   Post-op Pain Management:    Induction: Intravenous  Airway Management Planned: Oral ETT  Additional Equipment: None  Intra-op Plan:   Post-operative Plan: Extubation in OR  Informed Consent: I have reviewed the patients History and Physical, chart, labs and discussed the procedure including the risks, benefits and alternatives for the proposed anesthesia with the patient or authorized representative who has indicated his/her understanding and acceptance.   Dental advisory given and Consent reviewed with POA  Plan Discussed with: CRNA and Surgeon  Anesthesia Plan Comments:         Anesthesia Quick Evaluation

## 2015-10-31 NOTE — Transfer of Care (Signed)
Immediate Anesthesia Transfer of Care Note  Patient: Rachel Morgan  Procedure(s) Performed: Procedure(s): CYSTOSCOPY/ BILATERAL URETEROSCOPY/BILATERAL STONE EXTRACTION/ LEFT STENT PLACEMENT (Left) HOLMIUM LASER APPLICATION (N/A)  Patient Location: PACU  Anesthesia Type:General  Level of Consciousness: awake  Airway & Oxygen Therapy: Patient Spontanous Breathing and Patient connected to face mask oxygen  Post-op Assessment: Report given to RN and Post -op Vital signs reviewed and stable  Post vital signs: Reviewed and stable  Last Vitals:  Vitals:   10/31/15 0802 10/31/15 1304  BP: 118/61 (!) 120/49  Pulse:  73  Resp: 18 19  Temp: 37.4 C 36.8 C    Last Pain:  Vitals:   10/31/15 1012  TempSrc:   PainSc: 0-No pain      Patients Stated Pain Goal: 4 (20/94/70 9628)  Complications: No apparent anesthesia complications

## 2015-10-31 NOTE — Progress Notes (Signed)
Report received from Holley Raring, RN. PTAR called per Davy Pique. Patient ready for discharge.

## 2015-10-31 NOTE — Discharge Instructions (Signed)

## 2015-10-31 NOTE — Brief Op Note (Signed)
10/31/2015  12:34 PM  PATIENT:  Rachel Morgan  65 y.o. female  PRE-OPERATIVE DIAGNOSIS:  left proximal stone and bilateral renal stones  POST-OPERATIVE DIAGNOSIS:  left proximal stone and bilateral renal stones.  PROCEDURE:  Procedure(s): CYSTOSCOPY/ BILATERAL RETROGRADE/ BILATERAL URETEROSCOPY/BILATERAL STONE EXTRACTION/ LEFT STENT PLACEMENT (Left) HOLMIUM LASER APPLICATION (N/A)  SURGEON:  Surgeon(s) and Role:    * Irine Seal, MD - Primary  PHYSICIAN ASSISTANT:   ASSISTANTS: none   ANESTHESIA:   general  EBL:  No intake/output data recorded.  BLOOD ADMINISTERED:none  DRAINS: 6 x 24 left JJ stent   LOCAL MEDICATIONS USED:  NONE  SPECIMEN:  Source of Specimen:  stone fragments   DISPOSITION OF SPECIMEN:  to office lab  COUNTS:  YES  TOURNIQUET:  * No tourniquets in log *  DICTATION: .Other Dictation: Dictation Number [id number]  PLAN OF CARE: Discharge to home after PACU  PATIENT DISPOSITION:  PACU - hemodynamically stable.   Delay start of Pharmacological VTE agent (>24hrs) due to surgical blood loss or risk of bleeding: not applicable

## 2015-11-01 LAB — POCT I-STAT 4, (NA,K, GLUC, HGB,HCT)
GLUCOSE: 106 mg/dL — AB (ref 65–99)
HCT: 36 % (ref 36.0–46.0)
HEMOGLOBIN: 12.2 g/dL (ref 12.0–15.0)
Potassium: 5.1 mmol/L (ref 3.5–5.1)
SODIUM: 146 mmol/L — AB (ref 135–145)

## 2015-11-01 NOTE — Anesthesia Postprocedure Evaluation (Signed)
Anesthesia Post Note  Patient: Rachel Morgan  Procedure(s) Performed: Procedure(s) (LRB): CYSTOSCOPY/ BILATERAL URETEROSCOPY/BILATERAL STONE EXTRACTION/ LEFT STENT PLACEMENT (Left) HOLMIUM LASER APPLICATION (N/A)  Patient location during evaluation: PACU Anesthesia Type: General Level of consciousness: awake Pain management: pain level controlled Vital Signs Assessment: post-procedure vital signs reviewed and stable Respiratory status: spontaneous breathing Cardiovascular status: stable Postop Assessment: no signs of nausea or vomiting Anesthetic complications: no    Last Vitals:  Vitals:   10/31/15 1347 10/31/15 1520  BP: (!) 119/35 101/63  Pulse: 66 68  Resp: 16 20  Temp: 36.5 C 36.7 C    Last Pain:  Vitals:   11/01/15 0910  TempSrc:   PainSc: 0-No pain                 Fatima Fedie

## 2015-11-03 NOTE — Op Note (Signed)
Rachel Morgan, COATNEY              ACCOUNT NO.:  [id number]  MEDICAL RECORD NO.:  [id number]  LOCATION:                                 FACILITY:  PHYSICIAN:  Marshall Cork. Jeffie Pollock, M.D.    DATE OF BIRTH:  07-21-1950  DATE OF PROCEDURE: DATE OF DISCHARGE:                              OPERATIVE REPORT   PROCEDURES: 1. Cystoscopy with removal of left double-J stent. 2. Left ureteroscopic stone extraction with holmium lasertripsy and     insertion of double-J stent. 3. Removal of right double-J stent. 4. Right ureteroscopy with stone manipulation.  PREOPERATIVE DIAGNOSIS:  Left proximal ureteral and bilateral renal stones with recent sepsis.  POSTOPERATIVE DIAGNOSIS:  Left proximal ureteral and bilateral renal stones with recent sepsis.  SURGEON:  Marshall Cork. Jeffie Pollock, M.D.  ANESTHESIA:  General.  SPECIMEN:  Stone fragments.  DRAIN:  Six-French x 24-cm left double-J stent.  BLOOD LOSS:  Minimal.  COMPLICATIONS:  None.  INDICATIONS:  Ms. Shively is a 65 year old white female, who previously undergone bilateral stenting for left ureteral stone and bilateral renal stones with obstruction and sepsis.  She returns today for definitive management.  She remains on Invanz for antibiotic coverage.  FINDINGS AND PROCEDURE:  She was taken to the operating room where a general anesthetic was induced.  She was placed in lithotomy position. Her perineum and genitalia were prepped with Betadine solution, she was draped in usual sterile fashion.  Cystoscopy was performed using the 23-French scope and 30-degree lens. Examination revealed a stent at the left ureteral orifice and a stent at the right ureteral orifice, there was some ureteral orifice edema.  The left stent was grasped and pulled to the urethral meatus.  A guidewire was passed to the kidney.  A 38-cm digital access sheath was then inserted to just below the kidney.  The inner core and wire were removed, and the dual-lumen digital  flexible that was then passed to the proximal ureter where her stone was noted to be impacted.  The stone was engaged with a 200-micron laser fiber, which was initially set on 1 watt and 20 hertz.  The stone was broken into manageable fragments, which were then removed with an Engage basket.  Inspection of the internal collecting system revealed additional stone material that had previously been in the lower pole, but now moved into a mid-pole calyx.  The stone was also engaged with the laser and broken into manageable fragments.  The largest fragment was removed, it was left for 1-2 mm fragments.  Additionally, stones were identified in a lower pole calyx.  They were more round in shape, several removed intact, but others were fragmented into manageable fragments, which were then retrieved leaving only tiny fragments and sand behind.  Once all the stones had been fragmented, the significant fragments were removed.  Final inspection revealed no residual retained large stones. The kidney was irrigated out with the ureteroscope with the access sheath advanced into the renal pelvis with return of some additional sand and grip.  At this point, a guidewire was passed through the access sheath.  The access sheath was removed and a cystoscope was reinserted over the wire. A 6-French  24-cm double-J stent was then inserted to the kidney under fluoroscopic guidance.  The wire was removed leaving good coil in the kidney and good coil in the bladder.  The right ureteral catheter was then removed to the ureteral orifice and the guidewire was passed to the kidney.  The digital access sheath would only go about as far as the midureter, and then slipped out prior to ureteroscopy; however, I was able to advance the flexible ureteroscope alongside the wire to the kidney. Some small round stones were noted in the mid-to-proximal ureter.  These flushed out with endoscopy and did not require grasping.   Inspection of the kidney revealed multiple small round stones that were too small to grasp and were not in a good position to engage with the laser.  It was felt that they were all small enough that they should be able to pass without difficulty.  The entire collecting system was inspected and no significant large stones were noted.  At this point, it was not felt that replacement stenting was indicated, so the ureteroscope was backed out under direct vision.  The bladder was then drained.  The patient was taken down from lithotomy position.  Her anesthetic was reversed.  She was moved to the recovery room in stable condition.  There were no complications.     Marshall Cork. Jeffie Pollock, M.D.   ______________________________ Marshall Cork. Jeffie Pollock, M.D.    JJW/MEDQ  D:  11/01/2015  T:  11/02/2015  Job:  [id number]  cc:   [facility]

## 2015-11-06 ENCOUNTER — Telehealth: Payer: Self-pay | Admitting: Radiology

## 2015-11-06 ENCOUNTER — Other Ambulatory Visit: Payer: Self-pay | Admitting: Radiology

## 2015-11-06 NOTE — Telephone Encounter (Signed)
Notified pt's sister & POA, [name], of stent removal scheduled at [facility] on 11/10/15 with Dr Alyson Ingles & pre-op phone interview on 11/08/15. Advised per Dr Alyson Ingles pt may continue ASA 81mg  prior to procedure. [name] voices understanding.

## 2015-11-07 ENCOUNTER — Encounter (HOSPITAL_COMMUNITY): Payer: Self-pay

## 2015-11-07 DIAGNOSIS — Z466 Encounter for fitting and adjustment of urinary device: Secondary | ICD-10-CM | POA: Diagnosis present

## 2015-11-07 DIAGNOSIS — E119 Type 2 diabetes mellitus without complications: Secondary | ICD-10-CM | POA: Diagnosis not present

## 2015-11-07 DIAGNOSIS — N132 Hydronephrosis with renal and ureteral calculous obstruction: Secondary | ICD-10-CM | POA: Diagnosis not present

## 2015-11-07 DIAGNOSIS — Z880 Allergy status to penicillin: Secondary | ICD-10-CM | POA: Diagnosis not present

## 2015-11-07 DIAGNOSIS — G9389 Other specified disorders of brain: Secondary | ICD-10-CM | POA: Diagnosis not present

## 2015-11-07 DIAGNOSIS — K219 Gastro-esophageal reflux disease without esophagitis: Secondary | ICD-10-CM | POA: Diagnosis not present

## 2015-11-07 DIAGNOSIS — F419 Anxiety disorder, unspecified: Secondary | ICD-10-CM | POA: Diagnosis not present

## 2015-11-07 DIAGNOSIS — E079 Disorder of thyroid, unspecified: Secondary | ICD-10-CM | POA: Diagnosis not present

## 2015-11-07 DIAGNOSIS — E7800 Pure hypercholesterolemia, unspecified: Secondary | ICD-10-CM | POA: Diagnosis not present

## 2015-11-07 DIAGNOSIS — Z881 Allergy status to other antibiotic agents status: Secondary | ICD-10-CM | POA: Diagnosis not present

## 2015-11-07 DIAGNOSIS — Z87442 Personal history of urinary calculi: Secondary | ICD-10-CM | POA: Diagnosis not present

## 2015-11-07 DIAGNOSIS — I119 Hypertensive heart disease without heart failure: Secondary | ICD-10-CM | POA: Diagnosis not present

## 2015-11-08 ENCOUNTER — Encounter (HOSPITAL_COMMUNITY)
Admission: RE | Admit: 2015-11-08 | Discharge: 2015-11-08 | Disposition: A | Discharge status: Home / Self Care | Payer: Medicare Other | Source: Ambulatory Visit | Attending: Urology | Admitting: Urology

## 2015-11-08 ENCOUNTER — Other Ambulatory Visit (HOSPITAL_COMMUNITY): Payer: Medicare Other

## 2015-11-10 ENCOUNTER — Ambulatory Visit (HOSPITAL_COMMUNITY): Payer: Medicare Other | Admitting: Anesthesiology

## 2015-11-10 ENCOUNTER — Encounter (HOSPITAL_COMMUNITY)
Admission: RE | Disposition: A | Discharge status: Home / Self Care | Payer: Self-pay | Source: Ambulatory Visit | Attending: Urology

## 2015-11-10 ENCOUNTER — Encounter (HOSPITAL_COMMUNITY): Payer: Self-pay | Admitting: *Deleted

## 2015-11-10 ENCOUNTER — Ambulatory Visit (HOSPITAL_COMMUNITY)
Admission: RE | Admit: 2015-11-10 | Discharge: 2015-11-10 | Disposition: A | Discharge status: Home / Self Care | Payer: Medicare Other | Source: Ambulatory Visit | Attending: Urology | Admitting: Urology

## 2015-11-10 DIAGNOSIS — I119 Hypertensive heart disease without heart failure: Secondary | ICD-10-CM | POA: Insufficient documentation

## 2015-11-10 DIAGNOSIS — Z881 Allergy status to other antibiotic agents status: Secondary | ICD-10-CM | POA: Insufficient documentation

## 2015-11-10 DIAGNOSIS — T83192A Other mechanical complication of urinary stent, initial encounter: Secondary | ICD-10-CM | POA: Diagnosis not present

## 2015-11-10 DIAGNOSIS — Z87442 Personal history of urinary calculi: Secondary | ICD-10-CM | POA: Diagnosis not present

## 2015-11-10 DIAGNOSIS — G9389 Other specified disorders of brain: Secondary | ICD-10-CM | POA: Insufficient documentation

## 2015-11-10 DIAGNOSIS — Z466 Encounter for fitting and adjustment of urinary device: Secondary | ICD-10-CM | POA: Diagnosis not present

## 2015-11-10 DIAGNOSIS — F419 Anxiety disorder, unspecified: Secondary | ICD-10-CM | POA: Diagnosis not present

## 2015-11-10 DIAGNOSIS — N132 Hydronephrosis with renal and ureteral calculous obstruction: Secondary | ICD-10-CM | POA: Insufficient documentation

## 2015-11-10 DIAGNOSIS — E119 Type 2 diabetes mellitus without complications: Secondary | ICD-10-CM | POA: Insufficient documentation

## 2015-11-10 DIAGNOSIS — E079 Disorder of thyroid, unspecified: Secondary | ICD-10-CM | POA: Insufficient documentation

## 2015-11-10 DIAGNOSIS — E7800 Pure hypercholesterolemia, unspecified: Secondary | ICD-10-CM | POA: Insufficient documentation

## 2015-11-10 DIAGNOSIS — Z880 Allergy status to penicillin: Secondary | ICD-10-CM | POA: Insufficient documentation

## 2015-11-10 DIAGNOSIS — K219 Gastro-esophageal reflux disease without esophagitis: Secondary | ICD-10-CM | POA: Insufficient documentation

## 2015-11-10 HISTORY — PX: CYSTOSCOPY W/ URETERAL STENT REMOVAL: SHX1430

## 2015-11-10 LAB — CBC
HCT: 34 % — ABNORMAL LOW (ref 36.0–46.0)
HEMOGLOBIN: 10.8 g/dL — AB (ref 12.0–15.0)
MCH: 28.9 pg (ref 26.0–34.0)
MCHC: 31.8 g/dL (ref 30.0–36.0)
MCV: 90.9 fL (ref 78.0–100.0)
PLATELETS: 264 10*3/uL (ref 150–400)
RBC: 3.74 MIL/uL — ABNORMAL LOW (ref 3.87–5.11)
RDW: 14.8 % (ref 11.5–15.5)
WBC: 9.1 10*3/uL (ref 4.0–10.5)

## 2015-11-10 LAB — BASIC METABOLIC PANEL
Anion gap: 6 (ref 5–15)
BUN: 34 mg/dL — AB (ref 6–20)
CALCIUM: 8.7 mg/dL — AB (ref 8.9–10.3)
CHLORIDE: 109 mmol/L (ref 101–111)
CO2: 24 mmol/L (ref 22–32)
CREATININE: 1.76 mg/dL — AB (ref 0.44–1.00)
GFR, EST AFRICAN AMERICAN: 34 mL/min — AB (ref >60)
GFR, EST NON AFRICAN AMERICAN: 30 mL/min — AB (ref >60)
Glucose, Bld: 113 mg/dL — ABNORMAL HIGH (ref 65–99)
Potassium: 4.2 mmol/L (ref 3.5–5.1)
SODIUM: 139 mmol/L (ref 135–145)

## 2015-11-10 LAB — GLUCOSE, CAPILLARY: GLUCOSE-CAPILLARY: 83 mg/dL (ref 65–99)

## 2015-11-10 SURGERY — REMOVAL, STENT, URETER, CYSTOSCOPIC
Anesthesia: General | Site: Ureter | Laterality: Left

## 2015-11-10 MED ORDER — MIDAZOLAM HCL 2 MG/2ML IJ SOLN
1.0000 mg | INTRAMUSCULAR | Status: DC | PRN
  Administered 2015-11-10: 2 mg via INTRAVENOUS

## 2015-11-10 MED ORDER — ONDANSETRON HCL 4 MG/2ML IJ SOLN
4.0000 mg | Freq: Once | INTRAMUSCULAR | Status: AC
  Administered 2015-11-10: 4 mg via INTRAVENOUS

## 2015-11-10 MED ORDER — FENTANYL CITRATE (PF) 100 MCG/2ML IJ SOLN
INTRAMUSCULAR | Status: AC
  Filled 2015-11-10: qty 2

## 2015-11-10 MED ORDER — ONDANSETRON HCL 4 MG/2ML IJ SOLN
INTRAMUSCULAR | Status: AC
  Filled 2015-11-10: qty 2

## 2015-11-10 MED ORDER — LIDOCAINE HCL (PF) 1 % IJ SOLN
INTRAMUSCULAR | Status: AC
  Filled 2015-11-10: qty 15

## 2015-11-10 MED ORDER — SUCCINYLCHOLINE CHLORIDE 20 MG/ML IJ SOLN
INTRAMUSCULAR | Status: DC | PRN
  Administered 2015-11-10: 120 mg via INTRAVENOUS

## 2015-11-10 MED ORDER — PROPOFOL 10 MG/ML IV BOLUS
INTRAVENOUS | Status: AC
  Filled 2015-11-10: qty 20

## 2015-11-10 MED ORDER — LACTATED RINGERS IV SOLN
INTRAVENOUS | Status: DC
  Administered 2015-11-10: 11:00:00 via INTRAVENOUS

## 2015-11-10 MED ORDER — STERILE WATER FOR IRRIGATION IR SOLN
Status: DC | PRN
  Administered 2015-11-10: 1000 mL

## 2015-11-10 MED ORDER — MIDAZOLAM HCL 2 MG/2ML IJ SOLN
INTRAMUSCULAR | Status: AC
  Filled 2015-11-10: qty 2

## 2015-11-10 MED ORDER — ARTIFICIAL TEARS OP OINT
TOPICAL_OINTMENT | OPHTHALMIC | Status: AC
  Filled 2015-11-10: qty 7

## 2015-11-10 MED ORDER — SUCCINYLCHOLINE CHLORIDE 20 MG/ML IJ SOLN
INTRAMUSCULAR | Status: AC
  Filled 2015-11-10: qty 1

## 2015-11-10 MED ORDER — PROPOFOL 10 MG/ML IV BOLUS
INTRAVENOUS | Status: DC | PRN
  Administered 2015-11-10: 120 mg via INTRAVENOUS

## 2015-11-10 MED ORDER — FENTANYL CITRATE (PF) 100 MCG/2ML IJ SOLN
INTRAMUSCULAR | Status: DC | PRN
  Administered 2015-11-10 (×2): 25 ug via INTRAVENOUS
  Administered 2015-11-10: 50 ug via INTRAVENOUS

## 2015-11-10 MED ORDER — SODIUM CHLORIDE 0.9 % IV SOLN
1.0000 g | Freq: Once | INTRAVENOUS | Status: AC
  Administered 2015-11-10: 1 g via INTRAVENOUS
  Filled 2015-11-10: qty 1

## 2015-11-10 SURGICAL SUPPLY — 12 items
BAG DRAIN URO TABLE W/ADPT NS (DRAPE) ×3 IMPLANT
BAG HAMPER (MISCELLANEOUS) ×3 IMPLANT
GLOVE BIO SURGEON STRL SZ8 (GLOVE) ×3 IMPLANT
GLOVE BIOGEL PI IND STRL 7.0 (GLOVE) ×2 IMPLANT
GLOVE BIOGEL PI INDICATOR 7.0 (GLOVE) ×4
GOWN STRL REUS W/TWL LRG LVL3 (GOWN DISPOSABLE) ×3 IMPLANT
GOWN STRL REUS W/TWL XL LVL3 (GOWN DISPOSABLE) ×3 IMPLANT
IV NS IRRIG 3000ML ARTHROMATIC (IV SOLUTION) ×3 IMPLANT
KIT ROOM TURNOVER AP CYSTO (KITS) ×3 IMPLANT
PACK CYSTO (CUSTOM PROCEDURE TRAY) ×3 IMPLANT
PAD ARMBOARD 7.5X6 YLW CONV (MISCELLANEOUS) ×3 IMPLANT
WATER STERILE IRR 500ML POUR (IV SOLUTION) ×3 IMPLANT

## 2015-11-10 NOTE — H&P (View-Only) (Signed)
Consult Note Date of Service: 09/26/2015 4:10 PM Rachel Frock, MD  Urology  Expand All Collapse All   [] Hide copied text [] Hover for attribution information Reason for Consult: Left Ureteral, Right Renal Stones, Urosepsis  Referring Physician: Ezequiel Essex MD  Rachel Morgan is an 65 y.o. female.   HPI:   1 - Left Ureteral, Right Renal Stones - left prox ureteral 55mm stoen by CT x several 2017, tentativley planning on left ureteroscopy by Dr. Jeffie Morgan at [facility] 10/24/15. Also non-obstrucitng approx 46mm Rt renal stone. Remains present by imaging today on 09/26/15.  2 - Urosepsis - fevers, tachycardia, mental status changes with bacteruria c/w urosepsis on presentation to ER 09/2015. No lactic acidosis. UCX, BCX pending. Received empirit aztreonam, vanc, levaquin in ER. Left sided obstructing stone as per above.  Today "Rachel Morgan" is seen as urgent consult for above. Her sister "Rachel Morgan" who is HCPOA is with her.       Past Medical History:  Diagnosis Date  . Anxiety   . Diabetes mellitus   . Gall stones   . High cholesterol   . Hypertension   . Kidney stones   . Kidney stones   . MR (mental retardation)   . Thyroid disease     History reviewed. No pertinent surgical history.  History reviewed. No pertinent family history.  Social History:  reports that she has never smoked. She has never used smokeless tobacco. She reports that she does not drink alcohol or use drugs.  Allergies:      Allergies  Allergen Reactions  . Macrobid [Nitrofurantoin [employer]  . Penicillins     Medications: I have reviewed the patient's current medications.  LabResultsLast48Hours        Results for orders placed or performed during the hospital encounter of 09/26/15 (from the past 48 hour(s))  Urinalysis, Routine w reflex microscopic (not at [facility])     Status: Abnormal   Collection Time: 09/26/15 11:25 AM  Result Value Ref Range   Color,  Urine AMBER (A) YELLOW    Comment: BIOCHEMICALS MAY BE AFFECTED BY COLOR   APPearance HAZY (A) CLEAR   Specific Gravity, Urine 1.020 1.005 - 1.030   pH 6.0 5.0 - 8.0   Glucose, UA NEGATIVE NEGATIVE mg/dL   Hgb urine dipstick LARGE (A) NEGATIVE   Bilirubin Urine SMALL (A) NEGATIVE   Ketones, ur NEGATIVE NEGATIVE mg/dL   Protein, ur 100 (A) NEGATIVE mg/dL   Nitrite POSITIVE (A) NEGATIVE   Leukocytes, UA MODERATE (A) NEGATIVE  Urine microscopic-add on     Status: Abnormal   Collection Time: 09/26/15 11:25 AM  Result Value Ref Range   Squamous Epithelial / LPF 0-5 (A) NONE SEEN   WBC, UA TOO NUMEROUS TO COUNT 0 - 5 WBC/hpf   RBC / HPF TOO NUMEROUS TO COUNT 0 - 5 RBC/hpf   Bacteria, UA MANY (A) NONE SEEN  I-Stat CG4 Lactic Acid, ED  (not at  [facility])     Status: None   Collection Time: 09/26/15  1:40 PM  Result Value Ref Range   Lactic Acid, Venous 1.69 0.5 - 1.9 mmol/L       ImagingResults(Last48hours)  Dg Chest 2 View  Result Date: 09/26/2015 CLINICAL DATA:  Shortness of breath and abdominal pain EXAM: CHEST  2 VIEW COMPARISON:  02/12/2011 FINDINGS: Chronic cardiomegaly. Stable mediastinal contours. Low volume chest. There is no edema, consolidation, effusion, or pneumothorax. No acute osseous finding. IMPRESSION: No acute finding. Electronically Signed  By: Monte Fantasia M.D.   On: 09/26/2015 11:46   Ct Head Wo Contrast  Result Date: 09/26/2015 CLINICAL DATA:  FALL TODAY FELL FORWARD, FEVER, CONFUSION, HX DM EXAM: CT HEAD WITHOUT CONTRAST CT CERVICAL SPINE WITHOUT CONTRAST TECHNIQUE: Multidetector CT imaging of the head and cervical spine was performed following the standard protocol without intravenous contrast. Multiplanar CT image reconstructions of the cervical spine were also generated. COMPARISON:  None. FINDINGS: CT HEAD FINDINGS Brain: The ventricles are normal configuration. There is ventricular sulcal enlargement reflecting mild atrophy, somewhat  greater than generally seen in a patient of this age. There is no hydrocephalus. There are no parenchymal masses or mass effect. 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 There is no evidence of a recent cortical infarct. Other areas of white matter hypoattenuation noted consistent with mild chronic microvascular ischemic change. There are no extra-axial masses or abnormal fluid collections. There is no intracranial hemorrhage. Vascular: No hyperdense vessel or unexpected calcification. Skull: Normal. Negative for fracture or focal lesion. Sinuses/Orbits: Visualized orbits are unremarkable. Visualized sinuses and mastoid air cells are clear. Other: None CT CERVICAL SPINE FINDINGS Alignment: Neck is held in flexion. Allowing for this, there is normal vertebral body alignment. No spondylolisthesis. Skull base and vertebrae: No fractures. There is an area sclerosis in the right clivus, which is nonspecific. No osteolytic lesions. Soft tissues and spinal canal: No spinal canal mass or hematoma. No soft tissue neck masses or enlarged lymph nodes. Disc levels: Mild loss of disc height at C2-C3. Moderate to marked loss of disc height from C3-C4 through T1-T2. There are endplate sclerosis and osteophytes. Mild neural foraminal narrowing noted on the right at C3-C4 from uncovertebral spurring. No other significant stenosis. Upper chest: Clear visualized upper lungs. Other: None IMPRESSION: HEAD CT: No acute intracranial abnormalities. No skull fracture. Small focus of sclerosis in the right clivus, which is nonspecific. Although likely benign, neoplastic disease is not excluded. Recommend either follow-up MRI with and without contrast versus follow-up head CT in 3-4 months to document stability. CERVICAL CT: No fracture or acute finding. Significant degenerative changes as described. Electronically Signed   By: Lajean Manes M.D.   On: 09/26/2015 15:21   Ct  Cervical Spine Wo Contrast  Result Date: 09/26/2015 CLINICAL DATA:  FALL TODAY FELL FORWARD, FEVER, CONFUSION, HX DM EXAM: CT HEAD WITHOUT CONTRAST CT CERVICAL SPINE WITHOUT CONTRAST TECHNIQUE: Multidetector CT imaging of the head and cervical spine was performed following the standard protocol without intravenous contrast. Multiplanar CT image reconstructions of the cervical spine were also generated. COMPARISON:  None. FINDINGS: CT HEAD FINDINGS Brain: The ventricles are normal configuration. There is ventricular sulcal enlargement reflecting mild atrophy, somewhat greater than generally seen in a patient of this age. There is no hydrocephalus. There are no parenchymal masses or mass effect. 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 There is no evidence of a recent cortical infarct. Other areas of white matter hypoattenuation noted consistent with mild chronic microvascular ischemic change. There are no extra-axial masses or abnormal fluid collections. There is no intracranial hemorrhage. Vascular: No hyperdense vessel or unexpected calcification. Skull: Normal. Negative for fracture or focal lesion. Sinuses/Orbits: Visualized orbits are unremarkable. Visualized sinuses and mastoid air cells are clear. Other: None CT CERVICAL SPINE FINDINGS Alignment: Neck is held in flexion. Allowing for this, there is normal vertebral body alignment. No spondylolisthesis.  Skull base and vertebrae: No fractures. There is an area sclerosis in the right clivus, which is nonspecific. No osteolytic lesions. Soft tissues and spinal canal: No spinal canal mass or hematoma. No soft tissue neck masses or enlarged lymph nodes. Disc levels: Mild loss of disc height at C2-C3. Moderate to marked loss of disc height from C3-C4 through T1-T2. There are endplate sclerosis and osteophytes. Mild neural foraminal narrowing noted on the right at C3-C4 from uncovertebral spurring.  No other significant stenosis. Upper chest: Clear visualized upper lungs. Other: None IMPRESSION: HEAD CT: No acute intracranial abnormalities. No skull fracture. Small focus of sclerosis in the right clivus, which is nonspecific. Although likely benign, neoplastic disease is not excluded. Recommend either follow-up MRI with and without contrast versus follow-up head CT in 3-4 months to document stability. CERVICAL CT: No fracture or acute finding. Significant degenerative changes as described. Electronically Signed   By: Lajean Manes M.D.   On: 09/26/2015 15:21   Ct Renal Stone Study  Result Date: 09/26/2015 CLINICAL DATA:  Fall today.  Fever and confusion. EXAM: CT ABDOMEN AND PELVIS WITHOUT CONTRAST TECHNIQUE: Multidetector CT imaging of the abdomen and pelvis was performed following the standard protocol without IV contrast. COMPARISON:  09/14/2015 FINDINGS: Lower chest:  No contributory findings. Hepatobiliary: Partial nonvisualization of the right liver. No focal liver abnormality.Cholelithiasis. No evidence of acute cholecystitis. Pancreas: Generalized fatty atrophy.  No acute finding. Spleen: Unremarkable. Adrenals/Urinary Tract: Negative adrenals. Chronic left UPJ calculus and hydronephrosis with renal cortical thinning. The UPJ stone measures up to 14 x 7 mm on coronal reformats. There is milk of calcium in the left-sided calices. Right nephrolithiasis with calculi measuring up to 8 mm. Asymmetric right perinephric stranding that is new from previous. The bladder is decompressed by Foley catheter, limiting assessment. Stomach/Bowel: No obstruction. No appendicitis. Mild colonic diverticulosis. Vascular/Lymphatic: No acute vascular abnormality. No mass or adenopathy. Reproductive:No acute finding Other: No ascites or pneumoperitoneum. Musculoskeletal: No acute abnormalities. Advanced facet arthropathy in the lower lumbar spine with grade 1 L4-5 anterolisthesis. Disc degeneration from L3-4 to L5-S1  and in the lower thoracic spine. IMPRESSION: 1. Right perinephric edema, possible pyelonephritis in this clinical setting. No right hydronephrosis. 2. Bilateral nephrolithiasis including chronic left UPJ calculus causing left hydronephrosis. Left cortical atrophy. 3. Cholelithiasis without signs of cholecystitis. 4. History of fall with no posttraumatic finding. Electronically Signed   By: Monte Fantasia M.D.   On: 09/26/2015 15:21     Review of Systems  Constitutional: Positive for chills, fever and malaise/fatigue.  HENT: Negative.   Eyes: Negative.   Respiratory: Negative.   Cardiovascular: Negative.   Gastrointestinal: Negative.   Genitourinary: Positive for dysuria and flank pain.  Skin: Negative.   Neurological: Negative.   Endo/Heme/Allergies: Negative.   Psychiatric/Behavioral: Negative.    Blood pressure 117/57, pulse 78, temperature 100.5 F (38.1 C), temperature source Rectal, resp. rate 23, height 5' (1.524 m), weight 119.7 kg (264 lb), SpO2 97 %. Physical Exam  Constitutional: She appears well-developed.  Stigmata of mild mental retardation, very pleasant and cooperative. Pt's sister present.   HENT:  Head: Normocephalic.  Eyes: Pupils are equal, round, and reactive to light.  Cardiovascular: Normal rate.   Respiratory: Effort normal.  GI:  Moderate truncal obesity.   Genitourinary:  Genitourinary Comments: No CVAT at present  Musculoskeletal: Normal range of motion.  Neurological: She is alert.  Skin: Skin is warm.    Assessment/Plan:  1 - Left Ureteral, Right Renal Stones - current  picture of urosepsis complicates. Rec urgent renal decompression with bilateral JJ stents. This will also allow for some passive ureteral dilation to make upcoming ureteroscopy next month safer.  Risks, benefits, alternatives (no treatment, nephrostomy), expected peri-op course discussed. Frankly discussed that this will not directly address stone, but goal is to allow complete  clearance of infection prior to stone surgery which can hopefully proceed as planned next month. Pt and POA voiced understanding and desire to proceed.   2 - Urosepsis - renal decompression for source control as per above. Agree with current ABX, expecially aztreonam, awaiting further CX data.   Rachel Morgan 09/26/2015, 4:10 PM        Electronically signed by Rachel Frock, MD at 09/26/2015 8:24 PM      ED to Hosp-Admission (Discharged) on 09/26/2015        Routing History        Detailed Report

## 2015-11-10 NOTE — Anesthesia Procedure Notes (Signed)
Procedure Name: Intubation Date/Time: 11/10/2015 1:05 PM Performed by: Andree Elk, Montario Zilka A Pre-anesthesia Checklist: Patient identified, Patient being monitored, Timeout performed, Emergency Drugs available and Suction available Patient Re-evaluated:Patient Re-evaluated prior to inductionOxygen Delivery Method: Circle System Utilized Preoxygenation: Pre-oxygenation with 100% oxygen Intubation Type: IV induction Ventilation: Mask ventilation without difficulty Laryngoscope Size: Miller and 3 Grade View: Grade I Tube type: Oral Tube size: 7.0 mm Number of attempts: 1 Airway Equipment and Method: Stylet Placement Confirmation: ETT inserted through vocal cords under direct vision,  positive ETCO2 and breath sounds checked- equal and bilateral Secured at: 21 cm Tube secured with: Tape Dental Injury: Teeth and Oropharynx as per pre-operative assessment

## 2015-11-10 NOTE — Op Note (Signed)
.  Preoperative diagnosis: retained left ureteral stent  Postoperative diagnosis: Same  Procedure: 1 cystoscopy 2. Left ureteral stent removal  Attending: [name]  Anesthesia: General  Estimated blood loss: None  Drains: none  Specimens: none  Antibiotics: ertapenum  Findings: successful removal of left ureteral stent intact. No masses/lesions in the bladder. Ureteral orifices in normal anatomic location.  Indications: Patient is a 65 year old female with a history of a retained left ureteral stent.  After discussing treatment options, they decided proceed with left stent removal.  Procedure her in detail: The patient was brought to the operating room and a brief timeout was done to ensure correct patient, correct procedure, correct site.  General anesthesia was administered patient was placed in dorsal lithotomy position.  Their genitalia was then prepped and draped in usual sterile fashion.  A rigid 42 French cystoscope was passed in the urethra and the bladder.  Bladder was inspected free masses or lesions.  the ureteral orifices were in the normal orthotopic locations.  Using a graser the left stent was removed. the bladder was then drained and this concluded the procedure which was well tolerated by patient.  Complications: None  Condition: Stable, extubated, transferred to PACU  Plan: Patient is to be discharged home and followup in 4 weeks with Dr. Jeffie Pollock

## 2015-11-10 NOTE — Progress Notes (Signed)
Pharmacy Antibiotic Note  Rachel Morgan is Morgan 65 y.o. female admitted on 11/10/2015.  Pharmacy has been consulted for INVAZ dosing for prophylaxis.  PCN allergy noted.  Low risk with carbapenem (ertapenem)  Plan: Invanz 1gm IV today x 1 F/U plan after procedure     Temp (24hrs), Avg:97.8 F (36.6 C), Min:97.8 F (36.6 C), Max:97.8 F (36.6 C)  No results for input(s): WBC, CREATININE, LATICACIDVEN, VANCOTROUGH, VANCOPEAK, VANCORANDOM, GENTTROUGH, GENTPEAK, GENTRANDOM, TOBRATROUGH, TOBRAPEAK, TOBRARND, AMIKACINPEAK, AMIKACINTROU, AMIKACIN in the last 168 hours.  Estimated Creatinine Clearance: 40.3 mL/min (by C-G formula based on SCr of 1.64 mg/dL (H)).    Allergies  Allergen Reactions  . Macrobid [Nitrofurantoin Monohyd Macro] Hives and Swelling  . Penicillins Swelling and Rash    Has patient had Morgan PCN reaction causing immediate rash, facial/tongue/throat swelling, SOB or lightheadedness with hypotension: Yes Has patient had Morgan PCN reaction causing severe rash involving mucus membranes or skin necrosis: Yes Has patient had Morgan PCN reaction that required hospitalization: no Has patient had Morgan PCN reaction occurring within the last 10 years: no If all of the above answers are "NO", then may proceed with Cephalosporin use.    Antimicrobials this admission: Invanz 11/3 x 1   Rachel Morgan 11/10/2015 10:17 AM

## 2015-11-10 NOTE — Transfer of Care (Signed)
Immediate Anesthesia Transfer of Care Note  Patient: Rachel Morgan  Procedure(s) Performed: Procedure(s): CYSTOSCOPY WITH STENT REMOVAL (Left)  Patient Location: PACU  Anesthesia Type:General  Level of Consciousness: awake, oriented and patient cooperative  Airway & Oxygen Therapy: Patient Spontanous Breathing and Patient connected to face mask oxygen  Post-op Assessment: Report given to RN and Post -op Vital signs reviewed and stable  Post vital signs: Reviewed and stable  Last Vitals:  Vitals:   11/10/15 1245 11/10/15 1250  BP: 134/61 120/70  Pulse:    Resp: (!) 25 16  Temp:      Last Pain:  Vitals:   11/10/15 0954  TempSrc: Oral         Complications: No apparent anesthesia complications

## 2015-11-10 NOTE — Anesthesia Postprocedure Evaluation (Signed)
Anesthesia Post Note  Patient: Rachel Morgan  Procedure(s) Performed: Procedure(s) (LRB): CYSTOSCOPY WITH STENT REMOVAL (Left)  Patient location during evaluation: PACU Anesthesia Type: MAC Level of consciousness: awake and alert and oriented (At baseline) Pain management: pain level controlled Vital Signs Assessment: post-procedure vital signs reviewed and stable Respiratory status: spontaneous breathing Cardiovascular status: stable Postop Assessment: no signs of nausea or vomiting Anesthetic complications: no    Last Vitals:  Vitals:   11/10/15 1250 11/10/15 1345  BP: 120/70 (!) 139/126  Pulse:  79  Resp: 16 20  Temp:      Last Pain:  Vitals:   11/10/15 1345  TempSrc:   PainSc: 0-No pain                 ADAMS, AMY A

## 2015-11-10 NOTE — Discharge Instructions (Signed)
Cystoscopy, Care After Refer to this sheet in the next few weeks. These instructions provide you with information on caring for yourself after your procedure. Your caregiver may also give you more specific instructions. Your treatment has been planned according to current medical practices, but problems sometimes occur. Call your caregiver if you have any problems or questions after your procedure. HOME CARE INSTRUCTIONS  Things you can do to ease any discomfort after your procedure include:  Drinking enough water and fluids to keep your urine clear or pale yellow.  Taking a warm bath to relieve any burning feelings. SEEK IMMEDIATE MEDICAL CARE IF:   You have an increase in blood in your urine.  You notice blood clots in your urine.  You have difficulty passing urine.  You have the chills.  You have abdominal pain.  You have a fever or persistent symptoms for more than 2-3 days.  You have a fever and your symptoms suddenly get worse. MAKE SURE YOU:   Understand these instructions.  Will watch your condition.  Will get help right away if you are not doing well or get worse.   This information is not intended to replace advice given to you by your health care provider. Make sure you discuss any questions you have with your health care provider.   Document Released: 07/13/2004 Document Revised: 01/14/2014 Document Reviewed: 06/17/2011 Elsevier Interactive Patient Education 2016 [place] Anesthesia, Adult, Care After Refer to this sheet in the next few weeks. These instructions provide you with information on caring for yourself after your procedure. Your health care provider may also give you more specific instructions. Your treatment has been planned according to current medical practices, but problems sometimes occur. Call your health care provider if you have any problems or questions after your procedure. WHAT TO EXPECT AFTER THE PROCEDURE After the procedure, it  is typical to experience:  Sleepiness.  Nausea and vomiting. HOME CARE INSTRUCTIONS  For the first 24 hours after general anesthesia:  Have a responsible person with you.  Do not drive a car. If you are alone, do not take public transportation.  Do not drink alcohol.  Do not take medicine that has not been prescribed by your health care provider.  Do not sign important papers or make important decisions.  You may resume a normal diet and activities as directed by your health care provider.  Change bandages (dressings) as directed.  If you have questions or problems that seem related to general anesthesia, call the hospital and ask for the anesthetist or anesthesiologist on call. SEEK MEDICAL CARE IF:  You have nausea and vomiting that continue the day after anesthesia.  You develop a rash. SEEK IMMEDIATE MEDICAL CARE IF:   You have difficulty breathing.  You have chest pain.  You have any allergic problems.   This information is not intended to replace advice given to you by your health care provider. Make sure you discuss any questions you have with your health care provider.   Document Released: 04/01/2000 Document Revised: 01/14/2014 Document Reviewed: 04/24/2011 Elsevier Interactive Patient Education [employer].

## 2015-11-10 NOTE — Interval H&P Note (Signed)
History and Physical Interval Note:  11/10/2015 12:46 PM  Rachel Morgan  has presented today for surgery, with the diagnosis of left retained stent  The various methods of treatment have been discussed with the patient and family. After consideration of risks, benefits and other options for treatment, the patient has consented to  Procedure(s): CYSTOSCOPY WITH STENT REMOVAL (Left) as a surgical intervention .  The patient's history has been reviewed, patient examined, no change in status, stable for surgery.  I have reviewed the patient's chart and labs.  Questions were answered to the patient's satisfaction.     [name]

## 2015-11-10 NOTE — Anesthesia Preprocedure Evaluation (Signed)
Anesthesia Evaluation  Patient identified by MRN, date of birth, ID band Patient awake    Reviewed: Allergy & Precautions, NPO status , Patient's Chart, lab work & pertinent test results  History of Anesthesia Complications Negative for: history of anesthetic complications  Airway Mallampati: II  TM Distance: >3 FB Neck ROM: Full    Dental  (+) Edentulous Upper, Edentulous Lower   Pulmonary neg pulmonary ROS,    breath sounds clear to auscultation       Cardiovascular hypertension,  Rhythm:Regular     Neuro/Psych PSYCHIATRIC DISORDERS Anxiety Cerebral palsy     GI/Hepatic GERD  Medicated and Controlled,  Endo/Other  diabetes, Type 2Hypothyroidism   Renal/GU Renal disease     Musculoskeletal   Abdominal   Peds  Hematology   Anesthesia Other Findings   Reproductive/Obstetrics                             Anesthesia Physical Anesthesia Plan  ASA: III  Anesthesia Plan: General   Post-op Pain Management:    Induction: Intravenous, Rapid sequence and Cricoid pressure planned  Airway Management Planned: Oral ETT  Additional Equipment: None  Intra-op Plan:   Post-operative Plan: Extubation in OR  Informed Consent: I have reviewed the patients History and Physical, chart, labs and discussed the procedure including the risks, benefits and alternatives for the proposed anesthesia with the patient or authorized representative who has indicated his/her understanding and acceptance.   Dental advisory given and Consent reviewed with POA  Plan Discussed with: CRNA and Surgeon  Anesthesia Plan Comments:         Anesthesia Quick Evaluation

## 2015-11-13 ENCOUNTER — Encounter (HOSPITAL_COMMUNITY): Payer: Self-pay | Admitting: Urology

## 2015-11-17 ENCOUNTER — Other Ambulatory Visit: Payer: Self-pay | Admitting: Urology

## 2015-11-17 DIAGNOSIS — N200 Calculus of kidney: Secondary | ICD-10-CM

## 2015-11-24 ENCOUNTER — Ambulatory Visit: Payer: Medicare Other | Admitting: Urology

## 2015-12-11 ENCOUNTER — Ambulatory Visit (HOSPITAL_COMMUNITY)
Admission: RE | Admit: 2015-12-11 | Discharge: 2015-12-11 | Disposition: A | Discharge status: Home / Self Care | Payer: Medicare Other | Source: Ambulatory Visit | Attending: Urology | Admitting: Urology

## 2015-12-11 DIAGNOSIS — K8020 Calculus of gallbladder without cholecystitis without obstruction: Secondary | ICD-10-CM | POA: Diagnosis not present

## 2015-12-11 DIAGNOSIS — I700 Atherosclerosis of aorta: Secondary | ICD-10-CM | POA: Diagnosis not present

## 2015-12-11 DIAGNOSIS — N200 Calculus of kidney: Secondary | ICD-10-CM | POA: Insufficient documentation

## 2015-12-11 DIAGNOSIS — M47896 Other spondylosis, lumbar region: Secondary | ICD-10-CM | POA: Insufficient documentation

## 2016-01-17 ENCOUNTER — Emergency Department (HOSPITAL_COMMUNITY): Payer: Medicare Other

## 2016-01-17 ENCOUNTER — Encounter (HOSPITAL_COMMUNITY): Payer: Self-pay | Admitting: *Deleted

## 2016-01-17 ENCOUNTER — Emergency Department (HOSPITAL_COMMUNITY)
Admission: EM | Admit: 2016-01-17 | Discharge: 2016-01-17 | Disposition: A | Discharge status: Home / Self Care | Payer: Medicare Other | Source: Home / Self Care | Attending: Emergency Medicine | Admitting: Emergency Medicine

## 2016-01-17 DIAGNOSIS — Z79899 Other long term (current) drug therapy: Secondary | ICD-10-CM | POA: Insufficient documentation

## 2016-01-17 DIAGNOSIS — E039 Hypothyroidism, unspecified: Secondary | ICD-10-CM

## 2016-01-17 DIAGNOSIS — E1122 Type 2 diabetes mellitus with diabetic chronic kidney disease: Secondary | ICD-10-CM

## 2016-01-17 DIAGNOSIS — N183 Chronic kidney disease, stage 3 (moderate): Secondary | ICD-10-CM | POA: Insufficient documentation

## 2016-01-17 DIAGNOSIS — I129 Hypertensive chronic kidney disease with stage 1 through stage 4 chronic kidney disease, or unspecified chronic kidney disease: Secondary | ICD-10-CM

## 2016-01-17 DIAGNOSIS — N170 Acute kidney failure with tubular necrosis: Secondary | ICD-10-CM | POA: Diagnosis not present

## 2016-01-17 DIAGNOSIS — N179 Acute kidney failure, unspecified: Secondary | ICD-10-CM

## 2016-01-17 DIAGNOSIS — R109 Unspecified abdominal pain: Secondary | ICD-10-CM | POA: Diagnosis not present

## 2016-01-17 DIAGNOSIS — N390 Urinary tract infection, site not specified: Secondary | ICD-10-CM | POA: Insufficient documentation

## 2016-01-17 LAB — CBC WITH DIFFERENTIAL/PLATELET
Basophils Absolute: 0 10*3/uL (ref 0.0–0.1)
Basophils Relative: 0 %
EOS ABS: 0 10*3/uL (ref 0.0–0.7)
Eosinophils Relative: 0 %
HEMATOCRIT: 35.5 % — AB (ref 36.0–46.0)
HEMOGLOBIN: 11.1 g/dL — AB (ref 12.0–15.0)
LYMPHS ABS: 0.4 10*3/uL — AB (ref 0.7–4.0)
LYMPHS PCT: 3 %
MCH: 28.6 pg (ref 26.0–34.0)
MCHC: 31.3 g/dL (ref 30.0–36.0)
MCV: 91.5 fL (ref 78.0–100.0)
Monocytes Absolute: 0.7 10*3/uL (ref 0.1–1.0)
Monocytes Relative: 5 %
NEUTROS ABS: 12.8 10*3/uL — AB (ref 1.7–7.7)
NEUTROS PCT: 92 %
Platelets: 273 10*3/uL (ref 150–400)
RBC: 3.88 MIL/uL (ref 3.87–5.11)
RDW: 15.3 % (ref 11.5–15.5)
WBC: 14 10*3/uL — ABNORMAL HIGH (ref 4.0–10.5)

## 2016-01-17 LAB — COMPREHENSIVE METABOLIC PANEL
ALK PHOS: 91 U/L (ref 38–126)
ALT: 15 U/L (ref 14–54)
AST: 20 U/L (ref 15–41)
Albumin: 3.6 g/dL (ref 3.5–5.0)
Anion gap: 9 (ref 5–15)
BILIRUBIN TOTAL: 0.4 mg/dL (ref 0.3–1.2)
BUN: 45 mg/dL — ABNORMAL HIGH (ref 6–20)
CALCIUM: 9.3 mg/dL (ref 8.9–10.3)
CO2: 23 mmol/L (ref 22–32)
CREATININE: 2.6 mg/dL — AB (ref 0.44–1.00)
Chloride: 109 mmol/L (ref 101–111)
GFR calc non Af Amer: 18 mL/min — ABNORMAL LOW (ref >60)
GFR, EST AFRICAN AMERICAN: 21 mL/min — AB (ref >60)
GLUCOSE: 138 mg/dL — AB (ref 65–99)
Potassium: 5.6 mmol/L — ABNORMAL HIGH (ref 3.5–5.1)
SODIUM: 141 mmol/L (ref 135–145)
Total Protein: 8.4 g/dL — ABNORMAL HIGH (ref 6.5–8.1)

## 2016-01-17 LAB — URINALYSIS, ROUTINE W REFLEX MICROSCOPIC
BILIRUBIN URINE: NEGATIVE
Glucose, UA: NEGATIVE mg/dL
Ketones, ur: NEGATIVE mg/dL
NITRITE: POSITIVE — AB
PH: 6 (ref 5.0–8.0)
Protein, ur: >300 mg/dL — AB
SPECIFIC GRAVITY, URINE: 1.025 (ref 1.005–1.030)

## 2016-01-17 LAB — URINALYSIS, MICROSCOPIC (REFLEX)

## 2016-01-17 MED ORDER — ONDANSETRON HCL 4 MG/2ML IJ SOLN
4.0000 mg | Freq: Once | INTRAMUSCULAR | Status: AC
  Administered 2016-01-17: 4 mg via INTRAVENOUS
  Filled 2016-01-17: qty 2

## 2016-01-17 MED ORDER — ONDANSETRON 4 MG PO TBDP: 4.0000 mg | ORAL_TABLET | Freq: Three times a day (TID) | ORAL | 0 refills | Status: DC | PRN

## 2016-01-17 MED ORDER — DEXTROSE 5 % IV SOLN
1.0000 g | Freq: Once | INTRAVENOUS | Status: AC
  Administered 2016-01-17: 1 g via INTRAVENOUS
  Filled 2016-01-17: qty 10

## 2016-01-17 MED ORDER — CEPHALEXIN 500 MG PO CAPS: 500.0000 mg | ORAL_CAPSULE | Freq: Four times a day (QID) | ORAL | 0 refills | Status: DC

## 2016-01-17 MED ORDER — SODIUM CHLORIDE 0.9 % IV BOLUS (SEPSIS)
1000.0000 mL | Freq: Once | INTRAVENOUS | Status: AC
  Administered 2016-01-17: 1000 mL via INTRAVENOUS

## 2016-01-17 NOTE — ED Notes (Signed)
Patient transported to X-ray 

## 2016-01-17 NOTE — Discharge Instructions (Signed)

## 2016-01-17 NOTE — ED Provider Notes (Signed)
[facility] DEPT Provider Note   CSN: [id number] Arrival date & time: 01/17/16  1137   By signing my name below, I, Collene Leyden, attest that this documentation has been prepared under the direction and in the presence of Noemi Chapel, MD. Electronically Signed: Collene Leyden, Scribe. 01/17/16. 12:41 PM.   History   Chief Complaint Chief Complaint  Patient presents with  . Urinary Tract Infection    HPI Comments: Rachel Morgan is a 66 y.o. female hx of pyelonephritis, cerebral palsy, DM, MR, and hypothyroidism, who presents to the Emergency Department complaining of intermittent vomiting that began this morning. 3 episodes. Per sister the patient has a urinary tract infection. She has associated vomiting (everything thrown up), urine odor, painful urination, constipation,  and leg swelling. Sister states she took the patients urine sample to the family doctor because of an odor, she was diagnosed with a urinary tract infection. She was prescribed bactrim. Patient does not go outside due to a phobia of leaving the house. She denies any shortness of breath, abdominal pain, or diarrhea. The history is provided by the patient and a relative. No language interpreter was used.    Past Medical History:  Diagnosis Date  . Anxiety   . Calculus of gallbladder   . Cerebral palsy ([place])   . Chronic kidney disease   . Diabetes mellitus   . Gall stones   . GERD (gastroesophageal reflux disease)   . High cholesterol   . History of kidney stones   . Hypertension   . Hypothyroidism   . Kidney stones   . MR (mental retardation)   . Pyelonephritis   . Sepsis [facility])     Patient Active Problem List   Diagnosis Date Noted  . Palliative care encounter   . DNR (do not resuscitate) discussion   . Encephalopathy 10/17/2015  . Goals of care, counseling/discussion 10/17/2015  . Nephrolithiasis 09/27/2015  . Cerebral palsy ([place]) 09/27/2015  . Sepsis ([place]) 09/26/2015  . AKI (acute kidney  injury) ([place]) 09/26/2015  . Fever   . Urinary tract infectious disease   . Dilated cbd, acquired 09/14/2015  . Hypothyroidism 09/14/2015  . Hypertension 09/14/2015  . Depression with anxiety 09/14/2015  . UPJ obstruction, acquired 09/14/2015  . CKD (chronic kidney disease), stage III 09/14/2015  . GERD (gastroesophageal reflux disease) 09/14/2015  . Calculus of gallbladder without cholecystitis without obstruction   . Abdominal pain, right upper quadrant 02/11/2011  . Acute pyelonephritis 02/11/2011  . Agoraphobia 02/11/2011  . Acute renal failure ([place]) 02/11/2011  . Hyponatremia 02/11/2011  . Dehydration 02/11/2011    Past Surgical History:  Procedure Laterality Date  . CYSTOSCOPY W/ URETERAL STENT PLACEMENT Bilateral 09/26/2015   Procedure: CYSTOSCOPY WITH Bilateral  RETROGRADE PYELOGRAM/URETERAL STENT PLACEMENT;  Surgeon: [name], MD;  Location: [facility];  Service: Urology;  Laterality: Bilateral;  . CYSTOSCOPY W/ URETERAL STENT REMOVAL Left 11/10/2015   Procedure: CYSTOSCOPY WITH STENT REMOVAL;  Surgeon: Cleon Gustin, MD;  Location: [facility];  Service: Urology;  Laterality: Left;  . CYSTOSCOPY/RETROGRADE/URETEROSCOPY/STONE EXTRACTION WITH BASKET Left 10/31/2015   Procedure: CYSTOSCOPY/ BILATERAL URETEROSCOPY/BILATERAL STONE EXTRACTION/ LEFT STENT PLACEMENT;  Surgeon: Irine Seal, MD;  Location: [facility];  Service: Urology;  Laterality: Left;  . HOLMIUM LASER APPLICATION N/A 81/85/6314   Procedure: HOLMIUM LASER APPLICATION;  Surgeon: Irine Seal, MD;  Location: [facility];  Service: Urology;  Laterality: N/A;    OB History    No data available       Home Medications  Prior to Admission medications   Medication Sig Start Date End Date Taking? Authorizing Provider  ALPRAZolam Duanne Moron) 1 MG tablet Take 1 mg by mouth 2 (two) times daily.    Yes Historical Provider, MD  aspirin EC 81 MG tablet Take 81 mg by mouth every morning.    Yes Historical Provider, MD  busPIRone  (BUSPAR) 10 MG tablet Take 10 mg by mouth every morning.    Yes Historical Provider, MD  chlorproMAZINE (THORAZINE) 25 MG tablet Take 25 mg by mouth at bedtime.    Yes Historical Provider, MD  escitalopram (LEXAPRO) 20 MG tablet Take 20 mg by mouth at bedtime.   Yes Historical Provider, MD  ibuprofen (ADVIL,MOTRIN) 200 MG tablet Take 400 mg by mouth every 6 (six) hours as needed for moderate pain.   Yes Historical Provider, MD  levothyroxine (SYNTHROID, LEVOTHROID) 50 MCG tablet Take 50 mcg by mouth daily before breakfast.   Yes Historical Provider, MD  OLANZapine (ZYPREXA) 20 MG tablet Take 20 mg by mouth daily.  07/24/15  Yes Historical Provider, MD  omeprazole (PRILOSEC) 20 MG capsule Take 20 mg by mouth every morning.    Yes Historical Provider, MD  oxybutynin (DITROPAN-XL) 10 MG 24 hr tablet Take 10 mg by mouth every morning.    Yes Historical Provider, MD  spironolactone (ALDACTONE) 50 MG tablet Take 50 mg by mouth daily. 10/26/15  Yes Historical Provider, MD  sulfamethoxazole-trimethoprim (BACTRIM DS,SEPTRA DS) 800-160 MG tablet Take 1 tablet by mouth 2 (two) times daily.  01/16/16  Yes Historical Provider, MD  traMADol (ULTRAM) 50 MG tablet Take 1 tablet (50 mg total) by mouth every 6 (six) hours as needed for severe pain. 10/31/15  Yes Irine Seal, MD  zolpidem (AMBIEN) 10 MG tablet Take 10 mg by mouth at bedtime.   Yes Historical Provider, MD  cephALEXin (KEFLEX) 500 MG capsule Take 1 capsule (500 mg total) by mouth 4 (four) times daily. 01/17/16   Noemi Chapel, MD  ondansetron (ZOFRAN ODT) 4 MG disintegrating tablet Take 1 tablet (4 mg total) by mouth every 8 (eight) hours as needed for nausea. 01/17/16   Noemi Chapel, MD    Family History Family History  Problem Relation Age of Onset  . Family history unknown: Yes    Social History Social History  Substance Use Topics  . Smoking status: Never Smoker  . Smokeless tobacco: Never Used  . Alcohol use No     Allergies   Macrobid  [nitrofurantoin monohyd macro] and Penicillins   Review of Systems Review of Systems  Constitutional: Negative for fever.  Respiratory: Negative for shortness of breath.   Cardiovascular: Positive for leg swelling.  Gastrointestinal: Positive for constipation. Negative for abdominal pain and diarrhea.  Genitourinary: Positive for difficulty urinating and dysuria.  All other systems reviewed and are negative.    Physical Exam Updated Vital Signs BP 127/56   Pulse 70   Temp 98.3 F (36.8 C) (Oral)   Resp 20   Ht 5' (1.524 m)   Wt 274 lb (124.3 kg)   SpO2 99%   BMI 53.51 kg/m   Physical Exam  Constitutional: She is oriented to person, place, and time. She appears well-developed and well-nourished. No distress.  HENT:  Head: Normocephalic and atraumatic.  Mucous membranes dry.  dislodgment gaze.   Eyes: EOM are normal.  Neck: Normal range of motion.  Cardiovascular: Normal rate, regular rhythm and normal heart sounds.   No murmur heard. Pulmonary/Chest: Effort normal and breath sounds  normal. No respiratory distress. She has no wheezes. She has no rales.  Abdominal: Soft. She exhibits no distension and no mass. There is no tenderness. There is no guarding.  No HSM.   Musculoskeletal: Normal range of motion.  Neurological: She is alert and oriented to person, place, and time.  Skin: Skin is warm and dry.  Ecchymosis at the base of the right 4th toe.   Psychiatric: She has a normal mood and affect. Judgment normal.  Nursing note and vitals reviewed.    ED Treatments / Results  DIAGNOSTIC STUDIES: Oxygen Saturation is 96% on RA, normal by my interpretation.    COORDINATION OF CARE: 12:25 PM Discussed treatment plan with pt at bedside and pt agreed to plan.  Labs (all labs ordered are listed, but only abnormal results are displayed) Labs Reviewed  URINALYSIS, ROUTINE W REFLEX MICROSCOPIC - Abnormal; Notable for the following:       Result Value   APPearance TURBID  (*)    Hgb urine dipstick LARGE (*)    Protein, ur >300 (*)    Nitrite POSITIVE (*)    Leukocytes, UA MODERATE (*)    All other components within normal limits  CBC WITH DIFFERENTIAL/PLATELET - Abnormal; Notable for the following:    WBC 14.0 (*)    Hemoglobin 11.1 (*)    HCT 35.5 (*)    Neutro Abs 12.8 (*)    Lymphs Abs 0.4 (*)    All other components within normal limits  COMPREHENSIVE METABOLIC PANEL - Abnormal; Notable for the following:    Potassium 5.6 (*)    Glucose, Bld 138 (*)    BUN 45 (*)    Creatinine, Ser 2.60 (*)    Total Protein 8.4 (*)    GFR calc non Af Amer 18 (*)    GFR calc Af Amer 21 (*)    All other components within normal limits  URINALYSIS, MICROSCOPIC (REFLEX) - Abnormal; Notable for the following:    Bacteria, UA MANY (*)    Squamous Epithelial / LPF 0-5 (*)    All other components within normal limits  URINE CULTURE    Radiology Dg Foot Complete Right  Result Date: 01/17/2016 CLINICAL DATA:  Trauma, bruising of the fourth toe EXAM: RIGHT FOOT COMPLETE - 3+ VIEW COMPARISON:  None. FINDINGS: There is no evidence of fracture or dislocation. There is no evidence of arthropathy or other focal bone abnormality. Soft tissues are unremarkable. IMPRESSION: No acute osseous injury of the right foot. Electronically Signed   By: Kathreen Devoid   On: 01/17/2016 13:46    Procedures Procedures (including critical care time)  Medications Ordered in ED Medications  sodium chloride 0.9 % bolus 1,000 mL (0 mLs Intravenous Stopped 01/17/16 1454)  ondansetron (ZOFRAN) injection 4 mg (4 mg Intravenous Given 01/17/16 1244)  cefTRIAXone (ROCEPHIN) 1 g in dextrose 5 % 50 mL IVPB (1 g Intravenous New Bag/Given 01/17/16 1528)  ondansetron (ZOFRAN) injection 4 mg (4 mg Intravenous Given 01/17/16 1527)     Initial Impression / Assessment and Plan / ED Course  I have reviewed the triage vital signs and the nursing notes.  Pertinent labs & imaging results that were available  during my care of the patient were reviewed by me and considered in my medical decision making (see chart for details).  Clinical Course      culture has been ordered, family member has been informed of the abnormal kidney function. The patient otherwise appears well, she is tolerating  fluids, has received IV antibiotics, she will be prescribed cephalexin and encouraged to follow up outpatient for repeat kidney check. Patient is in agreement, family is in agreement. Patient well-appearing on discharge  Final Clinical Impressions(s) / ED Diagnoses   Final diagnoses:  Urinary tract infection without hematuria, site unspecified  AKI (acute kidney injury) ([place])    New Prescriptions New Prescriptions   CEPHALEXIN (KEFLEX) 500 MG CAPSULE    Take 1 capsule (500 mg total) by mouth 4 (four) times daily.   ONDANSETRON (ZOFRAN ODT) 4 MG DISINTEGRATING TABLET    Take 1 tablet (4 mg total) by mouth every 8 (eight) hours as needed for nausea.   I personally performed the services described in this documentation, which was scribed in my presence. The recorded information has been reviewed and is accurate.        Noemi Chapel, MD 01/17/16 [phone]

## 2016-01-17 NOTE — ED Triage Notes (Signed)
Pt with emesis, legs swelling, dx with UTI and kidney stones.  Antibiotic called in and given first dose and pt vomited back up.

## 2016-01-19 ENCOUNTER — Encounter (HOSPITAL_COMMUNITY): Payer: Self-pay | Admitting: Emergency Medicine

## 2016-01-19 ENCOUNTER — Inpatient Hospital Stay (HOSPITAL_COMMUNITY)
Admission: EM | Admit: 2016-01-19 | Discharge: 2016-01-26 | DRG: 683 | Disposition: A | Discharge status: Home Health | Payer: Medicare Other | Attending: Internal Medicine | Admitting: Internal Medicine

## 2016-01-19 ENCOUNTER — Emergency Department (HOSPITAL_COMMUNITY): Payer: Medicare Other

## 2016-01-19 DIAGNOSIS — N139 Obstructive and reflux uropathy, unspecified: Secondary | ICD-10-CM | POA: Diagnosis present

## 2016-01-19 DIAGNOSIS — E039 Hypothyroidism, unspecified: Secondary | ICD-10-CM | POA: Diagnosis present

## 2016-01-19 DIAGNOSIS — Z1612 Extended spectrum beta lactamase (ESBL) resistance: Secondary | ICD-10-CM | POA: Diagnosis present

## 2016-01-19 DIAGNOSIS — Z87442 Personal history of urinary calculi: Secondary | ICD-10-CM | POA: Diagnosis not present

## 2016-01-19 DIAGNOSIS — N170 Acute kidney failure with tubular necrosis: Principal | ICD-10-CM | POA: Diagnosis present

## 2016-01-19 DIAGNOSIS — F79 Unspecified intellectual disabilities: Secondary | ICD-10-CM | POA: Diagnosis present

## 2016-01-19 DIAGNOSIS — G809 Cerebral palsy, unspecified: Secondary | ICD-10-CM | POA: Diagnosis not present

## 2016-01-19 DIAGNOSIS — Z66 Do not resuscitate: Secondary | ICD-10-CM | POA: Diagnosis present

## 2016-01-19 DIAGNOSIS — Z23 Encounter for immunization: Secondary | ICD-10-CM | POA: Diagnosis present

## 2016-01-19 DIAGNOSIS — E875 Hyperkalemia: Secondary | ICD-10-CM | POA: Diagnosis present

## 2016-01-19 DIAGNOSIS — G808 Other cerebral palsy: Secondary | ICD-10-CM | POA: Diagnosis not present

## 2016-01-19 DIAGNOSIS — E860 Dehydration: Secondary | ICD-10-CM | POA: Diagnosis present

## 2016-01-19 DIAGNOSIS — K8020 Calculus of gallbladder without cholecystitis without obstruction: Secondary | ICD-10-CM | POA: Diagnosis present

## 2016-01-19 DIAGNOSIS — N179 Acute kidney failure, unspecified: Secondary | ICD-10-CM | POA: Diagnosis not present

## 2016-01-19 DIAGNOSIS — N132 Hydronephrosis with renal and ureteral calculous obstruction: Secondary | ICD-10-CM | POA: Diagnosis present

## 2016-01-19 DIAGNOSIS — Z87440 Personal history of urinary (tract) infections: Secondary | ICD-10-CM

## 2016-01-19 DIAGNOSIS — N184 Chronic kidney disease, stage 4 (severe): Secondary | ICD-10-CM | POA: Diagnosis present

## 2016-01-19 DIAGNOSIS — B9620 Unspecified Escherichia coli [E. coli] as the cause of diseases classified elsewhere: Secondary | ICD-10-CM | POA: Diagnosis present

## 2016-01-19 DIAGNOSIS — K219 Gastro-esophageal reflux disease without esophagitis: Secondary | ICD-10-CM | POA: Diagnosis present

## 2016-01-19 DIAGNOSIS — F419 Anxiety disorder, unspecified: Secondary | ICD-10-CM | POA: Diagnosis present

## 2016-01-19 DIAGNOSIS — D631 Anemia in chronic kidney disease: Secondary | ICD-10-CM | POA: Diagnosis present

## 2016-01-19 DIAGNOSIS — N390 Urinary tract infection, site not specified: Secondary | ICD-10-CM | POA: Diagnosis present

## 2016-01-19 DIAGNOSIS — Z79899 Other long term (current) drug therapy: Secondary | ICD-10-CM

## 2016-01-19 DIAGNOSIS — Z881 Allergy status to other antibiotic agents status: Secondary | ICD-10-CM

## 2016-01-19 DIAGNOSIS — I129 Hypertensive chronic kidney disease with stage 1 through stage 4 chronic kidney disease, or unspecified chronic kidney disease: Secondary | ICD-10-CM | POA: Diagnosis present

## 2016-01-19 DIAGNOSIS — E785 Hyperlipidemia, unspecified: Secondary | ICD-10-CM | POA: Diagnosis present

## 2016-01-19 DIAGNOSIS — Z888 Allergy status to other drugs, medicaments and biological substances status: Secondary | ICD-10-CM

## 2016-01-19 DIAGNOSIS — N183 Chronic kidney disease, stage 3 unspecified: Secondary | ICD-10-CM | POA: Diagnosis present

## 2016-01-19 DIAGNOSIS — E1122 Type 2 diabetes mellitus with diabetic chronic kidney disease: Secondary | ICD-10-CM | POA: Diagnosis present

## 2016-01-19 DIAGNOSIS — Z7982 Long term (current) use of aspirin: Secondary | ICD-10-CM

## 2016-01-19 DIAGNOSIS — R109 Unspecified abdominal pain: Secondary | ICD-10-CM | POA: Diagnosis present

## 2016-01-19 DIAGNOSIS — N1330 Unspecified hydronephrosis: Secondary | ICD-10-CM

## 2016-01-19 DIAGNOSIS — M898X9 Other specified disorders of bone, unspecified site: Secondary | ICD-10-CM | POA: Diagnosis present

## 2016-01-19 DIAGNOSIS — E872 Acidosis: Secondary | ICD-10-CM | POA: Diagnosis present

## 2016-01-19 DIAGNOSIS — Z7189 Other specified counseling: Secondary | ICD-10-CM | POA: Diagnosis not present

## 2016-01-19 DIAGNOSIS — I10 Essential (primary) hypertension: Secondary | ICD-10-CM | POA: Diagnosis present

## 2016-01-19 DIAGNOSIS — E034 Atrophy of thyroid (acquired): Secondary | ICD-10-CM | POA: Diagnosis not present

## 2016-01-19 DIAGNOSIS — Z880 Allergy status to penicillin: Secondary | ICD-10-CM

## 2016-01-19 DIAGNOSIS — Z452 Encounter for adjustment and management of vascular access device: Secondary | ICD-10-CM

## 2016-01-19 LAB — COMPREHENSIVE METABOLIC PANEL
ALBUMIN: 3.3 g/dL — AB (ref 3.5–5.0)
ALT: 16 U/L (ref 14–54)
AST: 24 U/L (ref 15–41)
Alkaline Phosphatase: 78 U/L (ref 38–126)
Anion gap: 10 (ref 5–15)
BILIRUBIN TOTAL: 0.3 mg/dL (ref 0.3–1.2)
BUN: 62 mg/dL — AB (ref 6–20)
CALCIUM: 9 mg/dL (ref 8.9–10.3)
CO2: 17 mmol/L — ABNORMAL LOW (ref 22–32)
CREATININE: 4.49 mg/dL — AB (ref 0.44–1.00)
Chloride: 110 mmol/L (ref 101–111)
GFR calc Af Amer: 11 mL/min — ABNORMAL LOW (ref >60)
GFR calc non Af Amer: 10 mL/min — ABNORMAL LOW (ref >60)
GLUCOSE: 123 mg/dL — AB (ref 65–99)
Potassium: 5.5 mmol/L — ABNORMAL HIGH (ref 3.5–5.1)
Sodium: 137 mmol/L (ref 135–145)
TOTAL PROTEIN: 7.9 g/dL (ref 6.5–8.1)

## 2016-01-19 LAB — URINALYSIS, ROUTINE W REFLEX MICROSCOPIC
BILIRUBIN URINE: NEGATIVE
Glucose, UA: NEGATIVE mg/dL
Ketones, ur: NEGATIVE mg/dL
NITRITE: NEGATIVE
PH: 5 (ref 5.0–8.0)
Protein, ur: 30 mg/dL — AB
Specific Gravity, Urine: 1.015 (ref 1.005–1.030)

## 2016-01-19 LAB — CBC
HEMATOCRIT: 34.7 % — AB (ref 36.0–46.0)
Hemoglobin: 11.1 g/dL — ABNORMAL LOW (ref 12.0–15.0)
MCH: 29.2 pg (ref 26.0–34.0)
MCHC: 32 g/dL (ref 30.0–36.0)
MCV: 91.3 fL (ref 78.0–100.0)
Platelets: 210 10*3/uL (ref 150–400)
RBC: 3.8 MIL/uL — ABNORMAL LOW (ref 3.87–5.11)
RDW: 15.6 % — AB (ref 11.5–15.5)
WBC: 15.4 10*3/uL — ABNORMAL HIGH (ref 4.0–10.5)

## 2016-01-19 LAB — LACTIC ACID, PLASMA
LACTIC ACID, VENOUS: 2 mmol/L — AB (ref 0.5–1.9)
Lactic Acid, Venous: 0.8 mmol/L (ref 0.5–1.9)

## 2016-01-19 LAB — LIPASE, BLOOD: Lipase: 11 U/L (ref 11–51)

## 2016-01-19 LAB — TSH: TSH: 5.895 u[IU]/mL — ABNORMAL HIGH (ref 0.350–4.500)

## 2016-01-19 MED ORDER — SODIUM CHLORIDE 0.9 % IV BOLUS (SEPSIS)
500.0000 mL | Freq: Once | INTRAVENOUS | Status: AC
  Administered 2016-01-19: 500 mL via INTRAVENOUS

## 2016-01-19 MED ORDER — PNEUMOCOCCAL VAC POLYVALENT 25 MCG/0.5ML IJ INJ
0.5000 mL | INJECTION | INTRAMUSCULAR | Status: AC
  Administered 2016-01-20: 0.5 mL via INTRAMUSCULAR
  Filled 2016-01-19: qty 0.5

## 2016-01-19 MED ORDER — HEPARIN SODIUM (PORCINE) 5000 UNIT/ML IJ SOLN
5000.0000 [IU] | Freq: Three times a day (TID) | INTRAMUSCULAR | Status: DC
  Administered 2016-01-19 – 2016-01-26 (×21): 5000 [IU] via SUBCUTANEOUS
  Filled 2016-01-19 (×19): qty 1

## 2016-01-19 MED ORDER — PANTOPRAZOLE SODIUM 40 MG PO TBEC
40.0000 mg | DELAYED_RELEASE_TABLET | Freq: Every day | ORAL | Status: DC
  Administered 2016-01-20 – 2016-01-26 (×7): 40 mg via ORAL
  Filled 2016-01-19 (×7): qty 1

## 2016-01-19 MED ORDER — MEROPENEM 1 G IV SOLR
INTRAVENOUS | Status: AC
  Filled 2016-01-19: qty 1

## 2016-01-19 MED ORDER — MEROPENEM 1 G IV SOLR
1.0000 g | Freq: Two times a day (BID) | INTRAVENOUS | Status: DC
  Administered 2016-01-19 – 2016-01-21 (×4): 1 g via INTRAVENOUS
  Filled 2016-01-19 (×5): qty 1

## 2016-01-19 MED ORDER — ALPRAZOLAM 1 MG PO TABS
1.0000 mg | ORAL_TABLET | Freq: Two times a day (BID) | ORAL | Status: DC
  Administered 2016-01-19 – 2016-01-26 (×14): 1 mg via ORAL
  Filled 2016-01-19 (×14): qty 1

## 2016-01-19 MED ORDER — SODIUM CHLORIDE 0.9 % IV SOLN
INTRAVENOUS | Status: AC
  Filled 2016-01-19: qty 1

## 2016-01-19 MED ORDER — ESCITALOPRAM OXALATE 20 MG PO TABS
20.0000 mg | ORAL_TABLET | Freq: Every day | ORAL | Status: DC
  Administered 2016-01-19 – 2016-01-25 (×7): 20 mg via ORAL
  Filled 2016-01-19 (×2): qty 1
  Filled 2016-01-19: qty 2
  Filled 2016-01-19: qty 1
  Filled 2016-01-19: qty 2
  Filled 2016-01-19 (×2): qty 1

## 2016-01-19 MED ORDER — SODIUM CHLORIDE 0.9 % IV SOLN
INTRAVENOUS | Status: DC
  Administered 2016-01-19: 15:00:00 via INTRAVENOUS

## 2016-01-19 MED ORDER — OXYBUTYNIN CHLORIDE ER 5 MG PO TB24
10.0000 mg | ORAL_TABLET | Freq: Every morning | ORAL | Status: DC
  Administered 2016-01-20 – 2016-01-26 (×7): 10 mg via ORAL
  Filled 2016-01-19 (×7): qty 2

## 2016-01-19 MED ORDER — OLANZAPINE 10 MG PO TABS
20.0000 mg | ORAL_TABLET | Freq: Every day | ORAL | Status: DC
  Administered 2016-01-20 – 2016-01-26 (×7): 20 mg via ORAL
  Filled 2016-01-19 (×3): qty 2
  Filled 2016-01-19 (×2): qty 4
  Filled 2016-01-19 (×2): qty 2

## 2016-01-19 MED ORDER — LEVOTHYROXINE SODIUM 50 MCG PO TABS
50.0000 ug | ORAL_TABLET | Freq: Every day | ORAL | Status: DC
  Administered 2016-01-20 – 2016-01-26 (×7): 50 ug via ORAL
  Filled 2016-01-19 (×7): qty 1

## 2016-01-19 MED ORDER — BUSPIRONE HCL 5 MG PO TABS
10.0000 mg | ORAL_TABLET | Freq: Every morning | ORAL | Status: DC
  Administered 2016-01-20 – 2016-01-26 (×7): 10 mg via ORAL
  Filled 2016-01-19 (×7): qty 2

## 2016-01-19 MED ORDER — ORAL CARE MOUTH RINSE
15.0000 mL | Freq: Two times a day (BID) | OROMUCOSAL | Status: DC
  Administered 2016-01-19 – 2016-01-26 (×11): 15 mL via OROMUCOSAL

## 2016-01-19 MED ORDER — ASPIRIN EC 81 MG PO TBEC
81.0000 mg | DELAYED_RELEASE_TABLET | Freq: Every morning | ORAL | Status: DC
  Administered 2016-01-20 – 2016-01-26 (×7): 81 mg via ORAL
  Filled 2016-01-19 (×7): qty 1

## 2016-01-19 MED ORDER — SODIUM CHLORIDE 0.9 % IV SOLN
INTRAVENOUS | Status: DC
  Administered 2016-01-19 – 2016-01-20 (×3): via INTRAVENOUS

## 2016-01-19 NOTE — ED Notes (Signed)
CRITICAL VALUE ALERT  Critical value received:  Lactic Acid 2.0  Date of notification:  01/19/16  Time of notification:  2011  Critical value read back:Yes.    Nurse who received alert:  Lucy Antigua, RN  Responding MD:  Dr. Marin Comment  Time MD responded:  2010

## 2016-01-19 NOTE — Progress Notes (Signed)
Pharmacy Antibiotic Note  Rachel Morgan is a 66 y.o. female admitted on 01/19/2016 with UTI.  Pharmacy has been consulted for Merrem dosing.  Plan: Merrem 1gm IV q12h F/U cxs and clinical progress  Height: 5' (152.4 cm) Weight: 275 lb (124.7 kg) IBW/kg (Calculated) : 45.5  Temp (24hrs), Avg:97.9 F (36.6 C), Min:97.9 F (36.6 C), Max:97.9 F (36.6 C)   Recent Labs Lab 01/17/16 1237 01/19/16 1312 01/19/16 1646  WBC 14.0* 15.4*  --   CREATININE 2.60* 4.49*  --   LATICACIDVEN  --   --  0.8    Estimated Creatinine Clearance: 15.6 mL/min (by C-G formula based on SCr of 4.49 mg/dL (H)).    Allergies  Allergen Reactions  . Macrobid [Nitrofurantoin Monohyd Macro] Hives and Swelling  . Penicillins Swelling and Rash    Has patient had a PCN reaction causing immediate rash, facial/tongue/throat swelling, SOB or lightheadedness with hypotension: Yes Has patient had a PCN reaction causing severe rash involving mucus membranes or skin necrosis: Yes Has patient had a PCN reaction that required hospitalization: no Has patient had a PCN reaction occurring within the last 10 years: no If all of the above answers are "NO", then may proceed with Cephalosporin use. Patient tolerated rocephin and ertapenem in the past    Antimicrobials this admission: merrem 1/12 >>   Dose adjustments this admission: n/a  Microbiology results: 1/12 UCx: pending   Thank you for allowing pharmacy to be a part of this patient's care. Isac Sarna, BS [place], [place] Clinical Pharmacist Pager [phone] 01/19/2016 7:52 PM

## 2016-01-19 NOTE — ED Triage Notes (Signed)
Here for questionable abnormal labs.

## 2016-01-19 NOTE — H&P (Signed)
History and Physical    Rachel Morgan YJE:[id number] DOB: April 08, 1950 DOA: 01/19/2016  PCP: [facility]  Patient coming from: Home.    Chief Complaint:  Dark urine.    HPI: Rachel Morgan is an 66 y.o. female with hx of Cerebral Palsy, anxiety, DM, HTN, kidney stone, recurrent UTI, HLD, lives at home with her sister, brought to the ER as HHA thought her urine looks dark, and she has not been taking adequate oral intake.  Evaluation in the ER showed elevated Cr to 4.4 with her prior Cr of 3.6 on Jan 10.  Her K was slightly elevated at 5.5 mm/L.   Her UA showed UTI, and culture from 1/10 grew E Coli, with sensitivity pending.  Her prior urine culture showed ESBL E Coli.  She was started on Merrem IV, and hospitalist was asked to admit her for further Tx.  She has very poor IV access, and access was attempted but not successful.   ED Course:  See above.  Rewiew of Systems: Unable.   Past Medical History:  Diagnosis Date  . Anxiety   . Calculus of gallbladder   . Cerebral palsy ([place])   . Chronic kidney disease   . Diabetes mellitus   . Gall stones   . GERD (gastroesophageal reflux disease)   . High cholesterol   . History of kidney stones   . Hypertension   . Hypothyroidism   . Kidney stones   . MR (mental retardation)   . Pyelonephritis   . Sepsis [facility])     Past Surgical History:  Procedure Laterality Date  . CYSTOSCOPY W/ URETERAL STENT PLACEMENT Bilateral 09/26/2015   Procedure: CYSTOSCOPY WITH Bilateral  RETROGRADE PYELOGRAM/URETERAL STENT PLACEMENT;  Surgeon: [name], MD;  [facility]: [facility];  Service: Urology;  Laterality: Bilateral;  . CYSTOSCOPY W/ URETERAL STENT REMOVAL Left 11/10/2015   Procedure: CYSTOSCOPY WITH STENT REMOVAL;  Surgeon: Cleon Gustin, MD;  Location: [facility];  Service: Urology;  Laterality: Left;  . CYSTOSCOPY/RETROGRADE/URETEROSCOPY/STONE EXTRACTION WITH BASKET Left 10/31/2015   Procedure: CYSTOSCOPY/ BILATERAL  URETEROSCOPY/BILATERAL STONE EXTRACTION/ LEFT STENT PLACEMENT;  Surgeon: Irine Seal, MD;  [facility];  Service: Urology;  Laterality: Left;  . HOLMIUM LASER APPLICATION N/A 63/78/5885   Procedure: HOLMIUM LASER APPLICATION;  Surgeon: Irine Seal, MD;  Location[facility];  Service: Urology;  Laterality: N/A;     reports that she has never smoked. She has never used smokeless tobacco. She reports that she does not drink alcohol or use drugs.  Allergies  Allergen Reactions  . Macrobid [Nitrofurantoin Monohyd Macro] Hives and Swelling  . Penicillins Swelling and Rash    Has patient had a PCN reaction causing immediate rash, facial/tongue/throat swelling, SOB or lightheadedness with hypotension: Yes Has patient had a PCN reaction causing severe rash involving mucus membranes or skin necrosis: Yes Has patient had a PCN reaction that required hospitalization: no Has patient had a PCN reaction occurring within the last 10 years: no If all of the above answers are "NO", then may proceed with Cephalosporin use. Patient tolerated rocephin and ertapenem in the past    Family History  Problem Relation Age of Onset  . Family history unknown: Yes     Prior to Admission medications   Medication Sig Start Date End Date Taking? Authorizing Provider  ALPRAZolam Duanne Moron) 1 MG tablet Take 1 mg by mouth 2 (two) times daily.    Yes Historical Provider, MD  aspirin EC 81 MG  tablet Take 81 mg by mouth every morning.    Yes Historical Provider, MD  busPIRone (BUSPAR) 10 MG tablet Take 10 mg by mouth every morning.    Yes Historical Provider, MD  cephALEXin (KEFLEX) 500 MG capsule Take 1 capsule (500 mg total) by mouth 4 (four) times daily. 01/17/16  Yes Noemi Chapel, MD  chlorproMAZINE (THORAZINE) 25 MG tablet Take 25 mg by mouth at bedtime.    Yes Historical Provider, MD  escitalopram (LEXAPRO) 20 MG tablet Take 20 mg by mouth at bedtime.   Yes Historical Provider, MD  ibuprofen (ADVIL,MOTRIN) 200 MG tablet  Take 400 mg by mouth every 6 (six) hours as needed for moderate pain.   Yes Historical Provider, MD  levothyroxine (SYNTHROID, LEVOTHROID) 50 MCG tablet Take 50 mcg by mouth daily before breakfast.   Yes Historical Provider, MD  OLANZapine (ZYPREXA) 20 MG tablet Take 20 mg by mouth daily.  07/24/15  Yes Historical Provider, MD  omeprazole (PRILOSEC) 20 MG capsule Take 20 mg by mouth every morning.    Yes Historical Provider, MD  ondansetron (ZOFRAN ODT) 4 MG disintegrating tablet Take 1 tablet (4 mg total) by mouth every 8 (eight) hours as needed for nausea. 01/17/16  Yes Noemi Chapel, MD  oxybutynin (DITROPAN-XL) 10 MG 24 hr tablet Take 10 mg by mouth every morning.    Yes Historical Provider, MD  spironolactone (ALDACTONE) 50 MG tablet Take 50 mg by mouth daily. 10/26/15  Yes Historical Provider, MD  zolpidem (AMBIEN) 10 MG tablet Take 10 mg by mouth at bedtime.   Yes Historical Provider, MD  traMADol (ULTRAM) 50 MG tablet Take 1 tablet (50 mg total) by mouth every 6 (six) hours as needed for severe pain. Patient not taking: Reported on 01/19/2016 10/31/15   Irine Seal, MD    Physical Exam: Vitals:   01/19/16 1430 01/19/16 1500 01/19/16 1530 01/19/16 1600  BP: 100/68 116/69 (!) 94/51 (!) 103/42  Pulse: 60 (!) 59 64 64  Resp:      Temp:      TempSrc:      SpO2: 99% 95% 96% 98%  Weight:      Height:          Constitutional: NAD, calm, comfortable Vitals:   01/19/16 1430 01/19/16 1500 01/19/16 1530 01/19/16 1600  BP: 100/68 116/69 (!) 94/51 (!) 103/42  Pulse: 60 (!) 59 64 64  Resp:      Temp:      TempSrc:      SpO2: 99% 95% 96% 98%  Weight:      Height:       Eyes: PERRL, lids and conjunctivae normal.  Disconjugate eye movements.  ENMT: Mucous membranes are moist. Posterior pharynx clear of any exudate or lesions.Normal dentition.  Neck: normal, supple, no masses, no thyromegaly Respiratory: clear to auscultation bilaterally, no wheezing, no crackles. Normal respiratory effort.  No accessory muscle use.  Cardiovascular: Regular rate and rhythm, no murmurs / rubs / gallops. No extremity edema. 2+ pedal pulses. No carotid bruits.  Abdomen: no tenderness, no masses palpated. No hepatosplenomegaly. Bowel sounds positive.  Musculoskeletal: no clubbing / cyanosis. No joint deformity upper and lower extremities. Good ROM, no contractures. Normal muscle tone.  Skin: no rashes, lesions, ulcers. No induration Neurologic: CN 2-12 grossly intact. Sensation intact, DTR normal. Strength 5/5 in all 4.  Psychiatric: Alert and non verbal.     Labs on Admission: I have personally reviewed following labs and imaging studies  CBC:  Recent  Labs Lab 01/17/16 1237 01/19/16 1312  WBC 14.0* 15.4*  NEUTROABS 12.8*  --   HGB 11.1* 11.1*  HCT 35.5* 34.7*  MCV 91.5 91.3  PLT 273 329   Basic Metabolic Panel:  Recent Labs Lab 01/17/16 1237 01/19/16 1312  NA 141 137  K 5.6* 5.5*  CL 109 110  CO2 23 17*  GLUCOSE 138* 123*  BUN 45* 62*  CREATININE 2.60* 4.49*  CALCIUM 9.3 9.0   GFR: Estimated Creatinine Clearance: 15.6 mL/min (by C-G formula based on SCr of 4.49 mg/dL (H)). Liver Function Tests:  Recent Labs Lab 01/17/16 1237 01/19/16 1312  AST 20 24  ALT 15 16  ALKPHOS 91 78  BILITOT 0.4 0.3  PROT 8.4* 7.9  ALBUMIN 3.6 3.3*    Recent Labs Lab 01/19/16 1312  LIPASE 11   Urine analysis:    Component Value Date/Time   COLORURINE YELLOW 01/19/2016 1600   APPEARANCEUR HAZY (A) 01/19/2016 1600   LABSPEC 1.015 01/19/2016 1600   PHURINE 5.0 01/19/2016 1600   GLUCOSEU NEGATIVE 01/19/2016 1600   HGBUR MODERATE (A) 01/19/2016 1600   BILIRUBINUR NEGATIVE 01/19/2016 1600   KETONESUR NEGATIVE 01/19/2016 1600   PROTEINUR 30 (A) 01/19/2016 1600   UROBILINOGEN 1.0 02/11/2011 2035   NITRITE NEGATIVE 01/19/2016 1600   LEUKOCYTESUR LARGE (A) 01/19/2016 1600    Recent Results (from the past 240 hour(s))  Urine culture     Status: Abnormal (Preliminary result)    Collection Time: 01/17/16 12:29 PM  Result Value Ref Range Status   Specimen Description URINE, CATHETERIZED  Final   Special Requests NONE  Final   Culture (A)  Final    >=100,000 COLONIES/mL ESCHERICHIA COLI SUSCEPTIBILITIES TO FOLLOW Performed at [facility]    Report Status PENDING  Incomplete     Radiological Exams on Admission: Dg Chest Port 1 View  Result Date: 01/19/2016 CLINICAL DATA:  Hypertension.  Diabetes mellitus. EXAM: PORTABLE CHEST 1 VIEW COMPARISON:  10/17/2015 FINDINGS: The heart is moderately enlarged. Vascular congestion. No evidence of interstitial edema. Lungs are under aerated. No obvious consolidation. IMPRESSION: Cardiomegaly and vascular congestion are compatible with mild volume overload. Electronically Signed   By: Marybelle Killings M.D.   On: 01/19/2016 17:12   Assessment/Plan Principal Problem:   AKI (acute kidney injury) ([place]) Active Problems:   Dehydration   CKD (chronic kidney disease), stage III   Urinary tract infectious disease   Cerebral palsy ([facility])   DNR (do not resuscitate) discussion    PLAN:   AKI:  Probably pre renal, but given her hx of nephrolithiasis, will obtain renal US to r/out concomitant hydronephrosis.  Give IVF.  Follow Cr carefully.  Poor IV access:  Will order PICC Line.  She would benefit getting a port-a-cath in the future.  Sister can arrange outpatient placement.   UTI: Given hx of ESBL EColi, will continue with Merrem.   DVT prophylaxis:  SubQ heparin. Code Status: DNR.  Family Communication: sister and niece. Disposition Plan: To home when appropriate.  Consults called: None.  Admission status: OBS.    Nillie Bartolotta MD FACP. [facility]  If 7PM-7AM, please contact night-coverage [email or web address] Password [facility]  01/19/2016, 6:27 PM

## 2016-01-19 NOTE — ED Provider Notes (Signed)
[facility] DEPT Provider Note   CSN: [id number] Arrival date & time: 01/19/16  1154     History   Chief Complaint Chief Complaint  Patient presents with  . Dehydration  . Abnormal Lab  . Abdominal Pain    HPI DESTA BUJAK is a 66 y.o. female.  HPI  Pt was seen at 1505. Per pt and her family, c/o gradual onset and persistence of constant "dehydration" since yesterday. Pt's home health RN "didn't like her color" so she received an order from PMD to draw labs. RN was unable to obtain labs, assumed pt was "dehydrated again" and sent her to the ED for further evaluation/admission. Pt has had decreased PO intake for the past 2 days, as well as decreased urination. Pt's urine has "been cloudy" per family. Denies fevers, no N/V/D, no abd pain, no CP/SOB, no cough.  Past Medical History:  Diagnosis Date  . Anxiety   . Calculus of gallbladder   . Cerebral palsy ([place])   . Chronic kidney disease   . Diabetes mellitus   . Gall stones   . GERD (gastroesophageal reflux disease)   . High cholesterol   . History of kidney stones   . Hypertension   . Hypothyroidism   . Kidney stones   . MR (mental retardation)   . Pyelonephritis   . Sepsis [facility])     Patient Active Problem List   Diagnosis Date Noted  . Palliative care encounter   . DNR (do not resuscitate) discussion   . Encephalopathy 10/17/2015  . Goals of care, counseling/discussion 10/17/2015  . Nephrolithiasis 09/27/2015  . Cerebral palsy ([place]) 09/27/2015  . Sepsis ([place]) 09/26/2015  . AKI (acute kidney injury) ([place]) 09/26/2015  . Fever   . Urinary tract infectious disease   . Dilated cbd, acquired 09/14/2015  . Hypothyroidism 09/14/2015  . Hypertension 09/14/2015  . Depression with anxiety 09/14/2015  . UPJ obstruction, acquired 09/14/2015  . CKD (chronic kidney disease), stage III 09/14/2015  . GERD (gastroesophageal reflux disease) 09/14/2015  . Calculus of gallbladder without cholecystitis without  obstruction   . Abdominal pain, right upper quadrant 02/11/2011  . Acute pyelonephritis 02/11/2011  . Agoraphobia 02/11/2011  . Acute renal failure ([place]) 02/11/2011  . Hyponatremia 02/11/2011  . Dehydration 02/11/2011    Past Surgical History:  Procedure Laterality Date  . CYSTOSCOPY W/ URETERAL STENT PLACEMENT Bilateral 09/26/2015   Procedure: CYSTOSCOPY WITH Bilateral  RETROGRADE PYELOGRAM/URETERAL STENT PLACEMENT;  Surgeon: [name], MD;  Location: [facility];  Service: Urology;  Laterality: Bilateral;  . CYSTOSCOPY W/ URETERAL STENT REMOVAL Left 11/10/2015   Procedure: CYSTOSCOPY WITH STENT REMOVAL;  Surgeon: Cleon Gustin, MD;  Location: [facility];  Service: Urology;  Laterality: Left;  . CYSTOSCOPY/RETROGRADE/URETEROSCOPY/STONE EXTRACTION WITH BASKET Left 10/31/2015   Procedure: CYSTOSCOPY/ BILATERAL URETEROSCOPY/BILATERAL STONE EXTRACTION/ LEFT STENT PLACEMENT;  Surgeon: Irine Seal, MD;  Location: [facility];  Service: Urology;  Laterality: Left;  . HOLMIUM LASER APPLICATION N/A 36/14/4315   Procedure: HOLMIUM LASER APPLICATION;  Surgeon: Irine Seal, MD;  Location: [facility];  Service: Urology;  Laterality: N/A;    OB History    No data available       Home Medications    Prior to Admission medications   Medication Sig Start Date End Date Taking? Authorizing Provider  ALPRAZolam Duanne Moron) 1 MG tablet Take 1 mg by mouth 2 (two) times daily.    Yes Historical Provider, MD  aspirin EC 81 MG tablet Take 81 mg by mouth every  morning.    Yes Historical Provider, MD  busPIRone (BUSPAR) 10 MG tablet Take 10 mg by mouth every morning.    Yes Historical Provider, MD  cephALEXin (KEFLEX) 500 MG capsule Take 1 capsule (500 mg total) by mouth 4 (four) times daily. 01/17/16  Yes Noemi Chapel, MD  chlorproMAZINE (THORAZINE) 25 MG tablet Take 25 mg by mouth at bedtime.    Yes Historical Provider, MD  escitalopram (LEXAPRO) 20 MG tablet Take 20 mg by mouth at bedtime.   Yes Historical Provider, MD    ibuprofen (ADVIL,MOTRIN) 200 MG tablet Take 400 mg by mouth every 6 (six) hours as needed for moderate pain.   Yes Historical Provider, MD  levothyroxine (SYNTHROID, LEVOTHROID) 50 MCG tablet Take 50 mcg by mouth daily before breakfast.   Yes Historical Provider, MD  OLANZapine (ZYPREXA) 20 MG tablet Take 20 mg by mouth daily.  07/24/15  Yes Historical Provider, MD  omeprazole (PRILOSEC) 20 MG capsule Take 20 mg by mouth every morning.    Yes Historical Provider, MD  ondansetron (ZOFRAN ODT) 4 MG disintegrating tablet Take 1 tablet (4 mg total) by mouth every 8 (eight) hours as needed for nausea. 01/17/16  Yes Noemi Chapel, MD  oxybutynin (DITROPAN-XL) 10 MG 24 hr tablet Take 10 mg by mouth every morning.    Yes Historical Provider, MD  spironolactone (ALDACTONE) 50 MG tablet Take 50 mg by mouth daily. 10/26/15  Yes Historical Provider, MD  zolpidem (AMBIEN) 10 MG tablet Take 10 mg by mouth at bedtime.   Yes Historical Provider, MD  traMADol (ULTRAM) 50 MG tablet Take 1 tablet (50 mg total) by mouth every 6 (six) hours as needed for severe pain. Patient not taking: Reported on 01/19/2016 10/31/15   Irine Seal, MD    Family History Family History  Problem Relation Age of Onset  . Family history unknown: Yes    Social History Social History  Substance Use Topics  . Smoking status: Never Smoker  . Smokeless tobacco: Never Used  . Alcohol use No     Allergies   Macrobid [nitrofurantoin monohyd macro] and Penicillins   Review of Systems Review of Systems ROS: Statement: All systems negative except as marked or noted in the HPI; Constitutional: Negative for fever and chills. ; ; Eyes: Negative for eye pain, redness and discharge. ; ; ENMT: Negative for ear pain, hoarseness, nasal congestion, sinus pressure and sore throat. ; ; Cardiovascular: Negative for chest pain, palpitations, diaphoresis, dyspnea and peripheral edema. ; ; Respiratory: Negative for cough, wheezing and stridor. ; ;  Gastrointestinal: +decreased PO intake. Negative for nausea, vomiting, diarrhea, abdominal pain, blood in stool, hematemesis, jaundice and rectal bleeding. . ; ; Genitourinary: +decreased urination. Negative for dysuria, flank pain and hematuria. ; ; Musculoskeletal: Negative for back pain and neck pain. Negative for swelling and trauma.; ; Skin: Negative for pruritus, rash, abrasions, blisters, bruising and skin lesion.; ; Neuro: Negative for headache, lightheadedness and neck stiffness. Negative for weakness, altered level of consciousness, altered mental status, extremity weakness, paresthesias, involuntary movement, seizure and syncope.      Physical Exam Updated Vital Signs BP (!) 103/42   Pulse 64   Temp 97.9 F (36.6 C) (Oral)   Resp 18   Ht 5' (1.524 m)   Wt 275 lb (124.7 kg)   SpO2 98%   BMI 53.71 kg/m    Patient Vitals for the past 24 hrs:  BP Temp Temp src Pulse Resp SpO2 Height Weight  01/19/16 1600 (!)  103/42 - - 64 - 98 % - -  01/19/16 1530 (!) 94/51 - - 64 - 96 % - -  01/19/16 1500 116/69 - - (!) 59 - 95 % - -  01/19/16 1430 100/68 - - 60 - 99 % - -  01/19/16 1208 (!) 130/49 97.9 F (36.6 C) Oral 66 18 99 % - -  01/19/16 1207 - - - - - - 5' (1.524 m) 275 lb (124.7 kg)     Physical Exam 1510: Physical examination:  Nursing notes reviewed; Vital signs and O2 SAT reviewed;  Constitutional: Well developed, Well nourished, In no acute distress; Head:  Normocephalic, atraumatic; Eyes: EOMI, PERRL, No scleral icterus; ENMT: Mouth and pharynx normal, Mucous membranes dry; Neck: Supple, Full range of motion, No lymphadenopathy; Cardiovascular: Regular rate and rhythm, No gallop; Respiratory: Breath sounds clear & equal bilaterally, No wheezes. Normal respiratory effort/excursion; Chest: Nontender, Movement normal; Abdomen: Soft, Nontender, Nondistended, Normal bowel sounds; Genitourinary: No CVA tenderness; Extremities: Pulses normal, No tenderness, No calf edema or asymmetry.;  Neuro: Awake, alert. Acting per baseline.  No facial droop. Speech clear. Moves extremities per baseline..; Skin: Color normal, Warm, Dry.    ED Treatments / Results  Labs (all labs ordered are listed, but only abnormal results are displayed)   EKG  EKG Interpretation None       Radiology   Procedures Procedures (including critical care time)  Medications Ordered in ED Medications  0.9 %  sodium chloride infusion ( Intravenous New Bag/Given 01/19/16 1524)  sodium chloride 0.9 % bolus 500 mL (not administered)  sodium chloride 0.9 % bolus 500 mL (0 mLs Intravenous Stopped 01/19/16 1602)     Initial Impression / Assessment and Plan / ED Course  I have reviewed the triage vital signs and the nursing notes.  Pertinent labs & imaging results that were available during my care of the patient were reviewed by me and considered in my medical decision making (see chart for details).  MDM Reviewed: nursing note, previous chart and vitals Reviewed previous: labs Interpretation: labs and x-ray   Results for orders placed or performed during the hospital encounter of 01/19/16  Lipase, blood  Result Value Ref Range   Lipase 11 11 - 51 U/L  Comprehensive metabolic panel  Result Value Ref Range   Sodium 137 135 - 145 mmol/L   Potassium 5.5 (H) 3.5 - 5.1 mmol/L   Chloride 110 101 - 111 mmol/L   CO2 17 (L) 22 - 32 mmol/L   Glucose, Bld 123 (H) 65 - 99 mg/dL   BUN 62 (H) 6 - 20 mg/dL   Creatinine, Ser 4.49 (H) 0.44 - 1.00 mg/dL   Calcium 9.0 8.9 - 10.3 mg/dL   Total Protein 7.9 6.5 - 8.1 g/dL   Albumin 3.3 (L) 3.5 - 5.0 g/dL   AST 24 15 - 41 U/L   ALT 16 14 - 54 U/L   Alkaline Phosphatase 78 38 - 126 U/L   Total Bilirubin 0.3 0.3 - 1.2 mg/dL   GFR calc non Af Amer 10 (L) >60 mL/min   GFR calc Af Amer 11 (L) >60 mL/min   Anion gap 10 5 - 15  CBC  Result Value Ref Range   WBC 15.4 (H) 4.0 - 10.5 K/uL   RBC 3.80 (L) 3.87 - 5.11 MIL/uL   Hemoglobin 11.1 (L) 12.0 - 15.0 g/dL     HCT 34.7 (L) 36.0 - 46.0 %   MCV 91.3 78.0 - 100.0 fL  MCH 29.2 26.0 - 34.0 pg   MCHC 32.0 30.0 - 36.0 g/dL   RDW 15.6 (H) 11.5 - 15.5 %   Platelets 210 150 - 400 K/uL  Urinalysis, Routine w reflex microscopic  Result Value Ref Range   Color, Urine YELLOW YELLOW   APPearance HAZY (A) CLEAR   Specific Gravity, Urine 1.015 1.005 - 1.030   pH 5.0 5.0 - 8.0   Glucose, UA NEGATIVE NEGATIVE mg/dL   Hgb urine dipstick MODERATE (A) NEGATIVE   Bilirubin Urine NEGATIVE NEGATIVE   Ketones, ur NEGATIVE NEGATIVE mg/dL   Protein, ur 30 (A) NEGATIVE mg/dL   Nitrite NEGATIVE NEGATIVE   Leukocytes, UA LARGE (A) NEGATIVE   RBC / HPF 0-5 0 - 5 RBC/hpf   WBC, UA TOO NUMEROUS TO COUNT 0 - 5 WBC/hpf   Bacteria, UA FEW (A) NONE SEEN   WBC Clumps PRESENT   Lactic acid, plasma  Result Value Ref Range   Lactic Acid, Venous 0.8 0.5 - 1.9 mmol/L   Dg Chest Port 1 View Result Date: 01/19/2016 CLINICAL DATA:  Hypertension.  Diabetes mellitus. EXAM: PORTABLE CHEST 1 VIEW COMPARISON:  10/17/2015 FINDINGS: The heart is moderately enlarged. Vascular congestion. No evidence of interstitial edema. Lungs are under aerated. No obvious consolidation. IMPRESSION: Cardiomegaly and vascular congestion are compatible with mild volume overload. Electronically Signed   By: Marybelle Killings M.D.   On: 01/19/2016 17:12    1800:  +UTI, UC pending; abx d/w PharmD given drug allergies and new ARF, will dose meropenem. IVF given for new ARF. Dx and testing d/w pt and family.  Questions answered.  Verb understanding, agreeable to admit.  T/C to [facility]. Marin Comment, case discussed, including:  HPI, pertinent PM/SHx, VS/PE, dx testing, ED course and treatment:  Agreeable to admit, requests he will come to the ED for evaluation.   Final Clinical Impressions(s) / ED Diagnoses   Final diagnoses:  None    New Prescriptions New Prescriptions   No medications on file     Francine Graven, DO 01/23/16 1709

## 2016-01-20 ENCOUNTER — Inpatient Hospital Stay (HOSPITAL_COMMUNITY): Payer: Medicare Other

## 2016-01-20 ENCOUNTER — Encounter (HOSPITAL_COMMUNITY): Payer: Self-pay | Admitting: General Practice

## 2016-01-20 DIAGNOSIS — E034 Atrophy of thyroid (acquired): Secondary | ICD-10-CM

## 2016-01-20 LAB — BASIC METABOLIC PANEL
Anion gap: 8 (ref 5–15)
BUN: 58 mg/dL — AB (ref 6–20)
CALCIUM: 8.1 mg/dL — AB (ref 8.9–10.3)
CHLORIDE: 111 mmol/L (ref 101–111)
CO2: 17 mmol/L — ABNORMAL LOW (ref 22–32)
CREATININE: 4.55 mg/dL — AB (ref 0.44–1.00)
GFR calc non Af Amer: 9 mL/min — ABNORMAL LOW (ref >60)
GFR, EST AFRICAN AMERICAN: 11 mL/min — AB (ref >60)
Glucose, Bld: 94 mg/dL (ref 65–99)
Potassium: 5.6 mmol/L — ABNORMAL HIGH (ref 3.5–5.1)
Sodium: 136 mmol/L (ref 135–145)

## 2016-01-20 LAB — URINE CULTURE

## 2016-01-20 MED ORDER — SODIUM CHLORIDE 0.9 % IV SOLN
INTRAVENOUS | Status: AC
  Filled 2016-01-20: qty 1

## 2016-01-20 MED ORDER — MAGNESIUM HYDROXIDE 400 MG/5ML PO SUSP
30.0000 mL | Freq: Once | ORAL | Status: AC
  Administered 2016-01-20: 30 mL via ORAL
  Filled 2016-01-20: qty 30

## 2016-01-20 MED ORDER — SODIUM CHLORIDE 0.45 % IV SOLN
INTRAVENOUS | Status: DC
  Administered 2016-01-20 – 2016-01-25 (×9): via INTRAVENOUS
  Filled 2016-01-20 (×20): qty 1000

## 2016-01-20 MED ORDER — SODIUM BICARBONATE 8.4 % IV SOLN
INTRAVENOUS | Status: AC
  Filled 2016-01-20: qty 50

## 2016-01-20 NOTE — Progress Notes (Addendum)
PROGRESS NOTE    Rachel Morgan  IFO:[id number] DOB: 30-Aug-1950 DOA: 01/19/2016 PCP: [facility]    Brief Narrative:  66 year old female with a history of cerebral palsy, recurrent UTIs, CKD stage III presented with decreased urine output/dark urine. She was found to have a urinary tract infection with Escherichia coli and worsening renal failure with a creatinine of 4.5. She was admitted for further treatments.   Assessment & Plan:   Principal Problem:   AKI (acute kidney injury) ([place]) Active Problems:   Dehydration   Hypothyroidism   Hypertension   CKD (chronic kidney disease), stage III   Urinary tract infectious disease   Cerebral palsy ([place])   1. AKI on CKD 3. Patient's baseline creatinine is 1.3-1.7. She is presented with a creatinine of 4.5. Etiologies include dehydration, NSAID use, recent Bactrim. Nephrology is following. She's been started on IV fluids. Will follow renal function. She does have a history of kidney stones. Renal ultrasound shows mild hydronephrosis on right side. Case discussed with Dr. Noah Delaine on call for urology who recommended hydration at this time. Repeat ultrasound in 48 hours to see if hydronephrosis is any worse. If hydronephrosis is worse, she may need CT scan. Venous access was limited. PICC line ordered by admitting physician. Discussed with Dr. Theador Hawthorne who agreed with placement of PICC line.  2. Urinary tract infection. Recent culture drawn on 1/10 positive for Escherichia coli, ESBL. Currently on Meropenem.  3. Hyperkalemia. Likely related to renal failure/bactrim and spironolactone. On IV fluids. Recheck in AM  4. Hypothyroidism. Continue synthroid.  5. Cerebral palsy.     DVT prophylaxis: heparin Code Status: full code Family Communication: discussed with sisters at bedside Disposition Plan: discharge home once improved.   Consultants:   Nephrology  Procedures:   PICC line 1/13>>  Antimicrobials:    Meropenem 1/12>>   Subjective: Patient is not able to effectively verbally communicate  Objective: Vitals:   01/19/16 1600 01/19/16 2059 01/20/16 0438 01/20/16 1447  BP: (!) 103/42 (!) 115/35 (!) 101/41 122/62  Pulse: 64 63 71 65  Resp:   18 18  Temp:  98.5 F (36.9 C) 97.6 F (36.4 C) 98.2 F (36.8 C)  TempSrc:  Oral Oral   SpO2: 98% 94% 96% 93%  Weight:  117.1 kg (258 lb 1.6 oz)    Height:  5' (1.524 m)      Intake/Output Summary (Last 24 hours) at 01/20/16 1527 Last data filed at 01/20/16 1300  Gross per 24 hour  Intake          1681.67 ml  Output                0 ml  Net          1681.67 ml   Filed Weights   01/19/16 1207 01/19/16 2059  Weight: 124.7 kg (275 lb) 117.1 kg (258 lb 1.6 oz)    Examination:  General exam: Appears calm and comfortable  Respiratory system: Clear to auscultation. Respiratory effort normal. Cardiovascular system: S1 & S2 heard, RRR. No JVD, murmurs, rubs, gallops or clicks. No pedal edema. Gastrointestinal system: Abdomen is nondistended, soft and nontender. No organomegaly or masses felt. Normal bowel sounds heard. Central nervous system: Alert and oriented. No focal neurological deficits. Extremities: Symmetric 5 x 5 power. Skin: No rashes, lesions or ulcers Psychiatry: unable to communicate due to MR     Data Reviewed: I have personally reviewed following labs and imaging studies  CBC:  Recent Labs Lab 01/17/16 1237 01/19/16 1312  WBC 14.0* 15.4*  NEUTROABS 12.8*  --   HGB 11.1* 11.1*  HCT 35.5* 34.7*  MCV 91.5 91.3  PLT 273 025   Basic Metabolic Panel:  Recent Labs Lab 01/17/16 1237 01/19/16 1312 01/20/16 0659  NA 141 137 136  K 5.6* 5.5* 5.6*  CL 109 110 111  CO2 23 17* 17*  GLUCOSE 138* 123* 94  BUN 45* 62* 58*  CREATININE 2.60* 4.49* 4.55*  CALCIUM 9.3 9.0 8.1*   GFR: Estimated Creatinine Clearance: 14.8 mL/min (by C-G formula based on SCr of 4.55 mg/dL (H)). Liver Function Tests:  Recent  Labs Lab 01/17/16 1237 01/19/16 1312  AST 20 24  ALT 15 16  ALKPHOS 91 78  BILITOT 0.4 0.3  PROT 8.4* 7.9  ALBUMIN 3.6 3.3*    Recent Labs Lab 01/19/16 1312  LIPASE 11   No results for input(s): AMMONIA in the last 168 hours. Coagulation Profile: No results for input(s): INR, PROTIME in the last 168 hours. Cardiac Enzymes: No results for input(s): CKTOTAL, CKMB, CKMBINDEX, TROPONINI in the last 168 hours. BNP (last 3 results) No results for input(s): PROBNP in the last 8760 hours. HbA1C: No results for input(s): HGBA1C in the last 72 hours. CBG: No results for input(s): GLUCAP in the last 168 hours. Lipid Profile: No results for input(s): CHOL, HDL, LDLCALC, TRIG, CHOLHDL, LDLDIRECT in the last 72 hours. Thyroid Function Tests:  Recent Labs  01/19/16 1323  TSH 5.895*   Anemia Panel: No results for input(s): VITAMINB12, FOLATE, FERRITIN, TIBC, IRON, RETICCTPCT in the last 72 hours. Sepsis Labs:  Recent Labs Lab 01/19/16 1646 01/19/16 1854  LATICACIDVEN 0.8 2.0*    Recent Results (from the past 240 hour(s))  Urine culture     Status: Abnormal   Collection Time: 01/17/16 12:29 PM  Result Value Ref Range Status   Specimen Description URINE, CATHETERIZED  Final   Special Requests NONE  Final   Culture (A)  Final    >=100,000 COLONIES/mL ESCHERICHIA COLI Confirmed Extended Spectrum Beta-Lactamase Producer (ESBL) Performed at [facility]    Report Status 01/20/2016 FINAL  Final   Organism ID, Bacteria ESCHERICHIA COLI (A)  Final      Susceptibility   Escherichia coli - MIC*    AMPICILLIN >=32 RESISTANT Resistant     CEFAZOLIN >=64 RESISTANT Resistant     CEFTRIAXONE >=64 RESISTANT Resistant     CIPROFLOXACIN >=4 RESISTANT Resistant     GENTAMICIN >=16 RESISTANT Resistant     IMIPENEM <=0.25 SENSITIVE Sensitive     NITROFURANTOIN <=16 SENSITIVE Sensitive     TRIMETH/SULFA <=20 SENSITIVE Sensitive     AMPICILLIN/SULBACTAM >=32 RESISTANT Resistant      PIP/TAZO 8 SENSITIVE Sensitive     Extended ESBL POSITIVE Resistant     * >=100,000 COLONIES/mL ESCHERICHIA COLI         Radiology Studies: US Renal  Result Date: 01/20/2016 CLINICAL DATA:  Acute kidney injury. History of a left ureteral stent. EXAM: RENAL / URINARY TRACT ULTRASOUND COMPLETE COMPARISON:  CT, 12/11/2015 FINDINGS: Right Kidney: Limited visualization due to bowel gas and shadowing gallstones. Kidney does not appear enlarged. There is evidence of mild right hydronephrosis. Left Kidney: Length: 10.9 cm. Mild renal cortical thinning. Normal parenchymal echogenicity. 7 mm stone in the lower pole. No hydronephrosis. Bladder: Appears normal for degree of bladder distention. IMPRESSION: 1. Incompletely imaged right kidney. Evidence of mild right hydronephrosis. 2. Left intrarenal stone.  No left  hydronephrosis. 3. Multiple gallstones. Electronically Signed   By: Lajean Manes M.D.   On: 01/20/2016 15:02   Dg Chest Port 1 View  Result Date: 01/20/2016 CLINICAL DATA:  PICC line placement EXAM: PORTABLE CHEST 1 VIEW COMPARISON:  January 19, 2016 FINDINGS: The right PICC line terminates below the brachiocephalic confluence in the SVC. Mild edema. Small amount of fluid in the right fissure. Stable cardiomegaly. No other change. IMPRESSION: The PICC line is in good position. There is mild pulmonary edema and probable fluid in the right fissure. Electronically Signed   By: Dorise Bullion III M.D   On: 01/20/2016 12:05   Dg Chest Port 1 View  Result Date: 01/19/2016 CLINICAL DATA:  Hypertension.  Diabetes mellitus. EXAM: PORTABLE CHEST 1 VIEW COMPARISON:  10/17/2015 FINDINGS: The heart is moderately enlarged. Vascular congestion. No evidence of interstitial edema. Lungs are under aerated. No obvious consolidation. IMPRESSION: Cardiomegaly and vascular congestion are compatible with mild volume overload. Electronically Signed   By: Marybelle Killings M.D.   On: 01/19/2016 17:12         Scheduled Meds: . ALPRAZolam  1 mg Oral BID  . aspirin EC  81 mg Oral q morning - 10a  . busPIRone  10 mg Oral q morning - 10a  . escitalopram  20 mg Oral QHS  . heparin  5,000 Units Subcutaneous Q8H  . levothyroxine  50 mcg Oral QAC breakfast  . mouth rinse  15 mL Mouth Rinse BID  . meropenem (MERREM) IV  1 g Intravenous Q12H  . OLANZapine  20 mg Oral Daily  . oxybutynin  10 mg Oral q morning - 10a  . pantoprazole  40 mg Oral Daily   Continuous Infusions: . sodium chloride 0.45 % 1,000 mL with sodium bicarbonate 50 mEq infusion       LOS: 1 day    Time spent: 69mins    Berthold Glace, MD [facility] Pager [phone]  If 7PM-7AM, please contact night-coverage [email or web address] Password [facility] 01/20/2016, 3:27 PM

## 2016-01-20 NOTE — Consult Note (Addendum)
Rachel Morgan MRN: [id number] DOB/AGE: Dec 16, 1950 66 y.o. Primary Care Physician:[facility] date: 01/19/2016 Chief Complaint:  Chief Complaint  Patient presents with  . Dehydration  . Abnormal Lab  . Abdominal Pain   Pt is unable to give hx .History is from medical records.  HPI:  Pt is an 66 y.o. female with hx of Cerebral Palsy,  recurrent UTI  brought to the ER as her care giver thought her urine looks dark, and she has not been taking adequate oral intake.    HPI dates back to past few days when pt care giver presented to the Emergency Department complaining of intermittent vomiting that began this morning, at that time pt had three episodes. Patients  urine sample was taken to the family doctor because of an odor and she was diagnosed with a urinary tract infection. Pt was prescribed bactrim Pt was sent back to home. Pt came in yesterday as care giver thought that pt was still dehydradet. Upon evaluation in the ER it showed  Creatinie to 4.4 with her prior Cr of 3.6 on Jan 10.  Pt also had her potasium was .elevated at 5.5 mm/L.    Pt UA showed UTI, and culture from 1/10 grew E Coli, with sensitivity pending. . Pt is unable to give hx .    Past Medical History:  Diagnosis Date  . Anxiety   . Calculus of gallbladder   . Cerebral palsy ([place])   . Chronic kidney disease   . Diabetes mellitus   . Gall stones   . GERD (gastroesophageal reflux disease)   . High cholesterol   . History of kidney stones   . Hypertension   . Hypothyroidism   . Kidney stones   . MR (mental retardation)   . Pyelonephritis   . Sepsis ([place])         Family History  Problem Relation Age of Onset  . Family history unknown: Yes    Social History:  reports that she has never smoked. She has never used smokeless tobacco. She reports that she does not drink alcohol or use drugs.   Allergies:  Allergies  Allergen Reactions  . Macrobid [Nitrofurantoin Monohyd  Macro] Hives and Swelling  . Penicillins Swelling and Rash    Has patient had a PCN reaction causing immediate rash, facial/tongue/throat swelling, SOB or lightheadedness with hypotension: Yes Has patient had a PCN reaction causing severe rash involving mucus membranes or skin necrosis: Yes Has patient had a PCN reaction that required hospitalization: no Has patient had a PCN reaction occurring within the last 10 years: no If all of the above answers are "NO", then may proceed with Cephalosporin use. Patient tolerated rocephin and ertapenem in the past    Medications Prior to Admission  Medication Sig Dispense Refill  . ALPRAZolam (XANAX) 1 MG tablet Take 1 mg by mouth 2 (two) times daily.     Marland Kitchen aspirin EC 81 MG tablet Take 81 mg by mouth every morning.     . busPIRone (BUSPAR) 10 MG tablet Take 10 mg by mouth every morning.     . cephALEXin (KEFLEX) 500 MG capsule Take 1 capsule (500 mg total) by mouth 4 (four) times daily. 28 capsule 0  . chlorproMAZINE (THORAZINE) 25 MG tablet Take 25 mg by mouth at bedtime.     Marland Kitchen escitalopram (LEXAPRO) 20 MG tablet Take 20 mg by mouth at bedtime.    Marland Kitchen ibuprofen (ADVIL,MOTRIN) 200 MG tablet Take  400 mg by mouth every 6 (six) hours as needed for moderate pain.    Marland Kitchen levothyroxine (SYNTHROID, LEVOTHROID) 50 MCG tablet Take 50 mcg by mouth daily before breakfast.    . OLANZapine (ZYPREXA) 20 MG tablet Take 20 mg by mouth daily.     Marland Kitchen omeprazole (PRILOSEC) 20 MG capsule Take 20 mg by mouth every morning.     . ondansetron (ZOFRAN ODT) 4 MG disintegrating tablet Take 1 tablet (4 mg total) by mouth every 8 (eight) hours as needed for nausea. 10 tablet 0  . oxybutynin (DITROPAN-XL) 10 MG 24 hr tablet Take 10 mg by mouth every morning.     Marland Kitchen spironolactone (ALDACTONE) 50 MG tablet Take 50 mg by mouth daily.    Marland Kitchen zolpidem (AMBIEN) 10 MG tablet Take 10 mg by mouth at bedtime.    . traMADol (ULTRAM) 50 MG tablet Take 1 tablet (50 mg total) by mouth every 6 (six)  hours as needed for severe pain. (Patient not taking: Reported on 01/19/2016) 20 tablet 0       WER:XVQMG from the symptoms mentioned above,there are no other symptoms referable to all systems reviewed.  . ALPRAZolam  1 mg Oral BID  . aspirin EC  81 mg Oral q morning - 10a  . busPIRone  10 mg Oral q morning - 10a  . escitalopram  20 mg Oral QHS  . heparin  5,000 Units Subcutaneous Q8H  . levothyroxine  50 mcg Oral QAC breakfast  . mouth rinse  15 mL Mouth Rinse BID  . meropenem (MERREM) IV  1 g Intravenous Q12H  . OLANZapine  20 mg Oral Daily  . oxybutynin  10 mg Oral q morning - 10a  . pantoprazole  40 mg Oral Daily  . pneumococcal 23 valent vaccine  0.5 mL Intramuscular Tomorrow-1000     Physical Exam: Vital signs in last 24 hours: Temp:  [97.6 F (36.4 C)-98.5 F (36.9 C)] 97.6 F (36.4 C) (01/13 0438) Pulse Rate:  [59-71] 71 (01/13 0438) Resp:  [18] 18 (01/13 0438) BP: (94-130)/(35-69) 101/41 (01/13 0438) SpO2:  [94 %-99 %] 96 % (01/13 0438) Weight:  [258 lb 1.6 oz (117.1 kg)-275 lb (124.7 kg)] 258 lb 1.6 oz (117.1 kg) (01/12 2059) Weight change:  Last BM Date: 01/17/16  Intake/Output from previous day: 01/12 0701 - 01/13 0700 In: 1201.7 [P.O.:480; I.V.:621.7; IV Piggyback:100] Out: -  Total I/O In: 240 [P.O.:240] Out: -    Physical Exam: General- pt is awake,does not follow commands Resp- No acute REsp distress, decreased BS at bases. CVS- S1S2 regular ij rate and rhythm GIT- BS+, soft, NT, ND, obese EXT- NO LE Edema, Cyanosis CNS- CN 2-12 grossly intact. Moving all 4 extremities Psych- normal mood and affect    Lab Results: CBC  Recent Labs  01/17/16 1237 01/19/16 1312  WBC 14.0* 15.4*  HGB 11.1* 11.1*  HCT 35.5* 34.7*  PLT 273 210    BMET  Recent Labs  01/19/16 1312 01/20/16 0659  NA 137 136  K 5.5* 5.6*  CL 110 111  CO2 17* 17*  GLUCOSE 123* 94  BUN 62* 58*  CREATININE 4.49* 4.55*  CALCIUM 9.0 8.1*   Creat 2018  4.5       2.6 ( baseline) 2017  1.7--4.1 2013  1.6--3.1   Anion Gap 136-128=8 Delta AG 0 Delta Bicarb  24-17=7   MICRO Recent Results (from the past 240 hour(s))  Urine culture     Status: Abnormal  Collection Time: 01/17/16 12:29 PM  Result Value Ref Range Status   Specimen Description URINE, CATHETERIZED  Final   Special Requests NONE  Final   Culture (A)  Final    >=100,000 COLONIES/mL ESCHERICHIA COLI Confirmed Extended Spectrum Beta-Lactamase Producer (ESBL) Performed at [facility]    Report Status 01/20/2016 FINAL  Final   Organism ID, Bacteria ESCHERICHIA COLI (A)  Final      Susceptibility   Escherichia coli - MIC*    AMPICILLIN >=32 RESISTANT Resistant     CEFAZOLIN >=64 RESISTANT Resistant     CEFTRIAXONE >=64 RESISTANT Resistant     CIPROFLOXACIN >=4 RESISTANT Resistant     GENTAMICIN >=16 RESISTANT Resistant     IMIPENEM <=0.25 SENSITIVE Sensitive     NITROFURANTOIN <=16 SENSITIVE Sensitive     TRIMETH/SULFA <=20 SENSITIVE Sensitive     AMPICILLIN/SULBACTAM >=32 RESISTANT Resistant     PIP/TAZO 8 SENSITIVE Sensitive     Extended ESBL POSITIVE Resistant     * >=100,000 COLONIES/mL ESCHERICHIA COLI      Lab Results  Component Value Date   CALCIUM 8.1 (L) 01/20/2016      Impression: 1)Renal  AKI secondary to Prerenal/ATN/ Post renal               AKI sec to Hypovolemia/Hypotension/Bactrim/ NSAIDS               AKI on CKD               CKD stage 3/4 .               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               Nephrolithiasis Hx Present.  2)HTN  BP was low , now better  3)Anemia HGb at goal (9--11)   4)CKD Mineral-Bone Disorder PTH not avail . Secondary Hyperparathyroidism w/u pending. Phosphorus - will  check.   5)ID-admitted with UTI On IV abx PMD following  6)Electrolytes  Hyperkalemic    AKI, CKD, NSAIDS and spironolactone as outpt  NOrmonatremic   7)Acid base Co2 not at goal NON AG acidosis     Plan:  will suggest to get ABG if bicarb not better in am.  Will suggest to add Bicarb in IVF Will get FENA Will ask for 2d Echo     Bonita S 01/20/2016, 11:18 AM

## 2016-01-21 ENCOUNTER — Encounter (HOSPITAL_COMMUNITY)
Admission: EM | Disposition: A | Discharge status: Home Health | Payer: Self-pay | Source: Home / Self Care | Attending: Internal Medicine

## 2016-01-21 ENCOUNTER — Encounter (HOSPITAL_COMMUNITY): Payer: Self-pay | Admitting: Certified Registered Nurse Anesthetist

## 2016-01-21 ENCOUNTER — Inpatient Hospital Stay (HOSPITAL_COMMUNITY): Payer: Medicare Other

## 2016-01-21 DIAGNOSIS — E875 Hyperkalemia: Secondary | ICD-10-CM | POA: Diagnosis present

## 2016-01-21 DIAGNOSIS — N132 Hydronephrosis with renal and ureteral calculous obstruction: Secondary | ICD-10-CM | POA: Diagnosis present

## 2016-01-21 HISTORY — PX: CYSTOSCOPY WITH STENT PLACEMENT: SHX5790

## 2016-01-21 LAB — BASIC METABOLIC PANEL
Anion gap: 7 (ref 5–15)
Anion gap: 8 (ref 5–15)
BUN: 53 mg/dL — AB (ref 6–20)
BUN: 53 mg/dL — ABNORMAL HIGH (ref 6–20)
CALCIUM: 7.8 mg/dL — AB (ref 8.9–10.3)
CALCIUM: 8.2 mg/dL — AB (ref 8.9–10.3)
CHLORIDE: 105 mmol/L (ref 101–111)
CO2: 19 mmol/L — ABNORMAL LOW (ref 22–32)
CO2: 21 mmol/L — AB (ref 22–32)
CREATININE: 4.53 mg/dL — AB (ref 0.44–1.00)
Chloride: 107 mmol/L (ref 101–111)
Creatinine, Ser: 4.5 mg/dL — ABNORMAL HIGH (ref 0.44–1.00)
GFR calc Af Amer: 11 mL/min — ABNORMAL LOW (ref >60)
GFR calc non Af Amer: 10 mL/min — ABNORMAL LOW (ref >60)
GFR, EST AFRICAN AMERICAN: 11 mL/min — AB (ref >60)
GFR, EST NON AFRICAN AMERICAN: 9 mL/min — AB (ref >60)
Glucose, Bld: 108 mg/dL — ABNORMAL HIGH (ref 65–99)
Glucose, Bld: 107 mg/dL — ABNORMAL HIGH (ref 65–99)
POTASSIUM: 6.1 mmol/L — AB (ref 3.5–5.1)
Potassium: 5.9 mmol/L — ABNORMAL HIGH (ref 3.5–5.1)
SODIUM: 133 mmol/L — AB (ref 135–145)
SODIUM: 134 mmol/L — AB (ref 135–145)

## 2016-01-21 LAB — CBC
HCT: 29.1 % — ABNORMAL LOW (ref 36.0–46.0)
Hemoglobin: 9.6 g/dL — ABNORMAL LOW (ref 12.0–15.0)
MCH: 29.5 pg (ref 26.0–34.0)
MCHC: 33 g/dL (ref 30.0–36.0)
MCV: 89.5 fL (ref 78.0–100.0)
PLATELETS: 219 10*3/uL (ref 150–400)
RBC: 3.25 MIL/uL — ABNORMAL LOW (ref 3.87–5.11)
RDW: 15.1 % (ref 11.5–15.5)
WBC: 6.6 10*3/uL (ref 4.0–10.5)

## 2016-01-21 LAB — URINE CULTURE: CULTURE: NO GROWTH

## 2016-01-21 LAB — GLUCOSE, CAPILLARY: GLUCOSE-CAPILLARY: 104 mg/dL — AB (ref 65–99)

## 2016-01-21 SURGERY — CYSTOSCOPY, WITH STENT INSERTION
Anesthesia: General | Laterality: Right

## 2016-01-21 MED ORDER — ZOLPIDEM TARTRATE 5 MG PO TABS
5.0000 mg | ORAL_TABLET | Freq: Every evening | ORAL | Status: DC | PRN
  Administered 2016-01-21 – 2016-01-25 (×5): 5 mg via ORAL
  Filled 2016-01-21 (×5): qty 1

## 2016-01-21 MED ORDER — FUROSEMIDE 10 MG/ML IJ SOLN
40.0000 mg | Freq: Two times a day (BID) | INTRAMUSCULAR | Status: DC
Start: 2016-01-21 — End: 2016-01-21

## 2016-01-21 MED ORDER — SODIUM POLYSTYRENE SULFONATE 15 GM/60ML PO SUSP
30.0000 g | Freq: Once | ORAL | Status: AC
  Administered 2016-01-21: 30 g via ORAL
  Filled 2016-01-21: qty 120

## 2016-01-21 MED ORDER — SODIUM CHLORIDE 0.9 % IV SOLN
500.0000 mg | Freq: Two times a day (BID) | INTRAVENOUS | Status: DC
  Administered 2016-01-21 – 2016-01-24 (×6): 500 mg via INTRAVENOUS
  Filled 2016-01-21 (×7): qty 0.5

## 2016-01-21 MED ORDER — SODIUM POLYSTYRENE SULFONATE 15 GM/60ML PO SUSP
45.0000 g | Freq: Once | ORAL | Status: AC
  Administered 2016-01-21: 45 g via RECTAL
  Filled 2016-01-21: qty 180

## 2016-01-21 MED ORDER — MILK AND MOLASSES ENEMA
1.0000 | Freq: Once | RECTAL | Status: AC
  Administered 2016-01-21: 250 mL via RECTAL

## 2016-01-21 MED ORDER — ACETAMINOPHEN 325 MG PO TABS
650.0000 mg | ORAL_TABLET | Freq: Four times a day (QID) | ORAL | Status: DC | PRN
  Administered 2016-01-21 – 2016-01-22 (×2): 650 mg via ORAL
  Filled 2016-01-21 (×2): qty 2

## 2016-01-21 SURGICAL SUPPLY — 14 items
BAG URO CATCHER STRL LF (MISCELLANEOUS) ×3 IMPLANT
CATH FOLEY 2WAY SLVR  5CC 16FR (CATHETERS) ×2
CATH FOLEY 2WAY SLVR 5CC 16FR (CATHETERS) ×1 IMPLANT
CATH INTERMIT  6FR 70CM (CATHETERS) IMPLANT
CLOTH BEACON ORANGE TIMEOUT ST (SAFETY) ×3 IMPLANT
GLOVE BIO SURGEON STRL SZ7.5 (GLOVE) ×3 IMPLANT
GOWN STRL REUS W/TWL LRG LVL3 (GOWN DISPOSABLE) ×6 IMPLANT
GUIDEWIRE ANG ZIPWIRE 038X150 (WIRE) IMPLANT
GUIDEWIRE STR DUAL SENSOR (WIRE) IMPLANT
MANIFOLD NEPTUNE II (INSTRUMENTS) ×3 IMPLANT
PACK CYSTO (CUSTOM PROCEDURE TRAY) ×3 IMPLANT
STENT URET 6FRX24 CONTOUR (STENTS) ×3 IMPLANT
TUBING CONNECTING 10 (TUBING) ×2 IMPLANT
TUBING CONNECTING 10' (TUBING) ×1

## 2016-01-21 NOTE — Progress Notes (Addendum)
Rachel Morgan  MRN: [id number]  DOB/AGE: 64/31/1952 64 y.o.  Primary Care Physician:[facility] date: 01/19/2016  Chief Complaint:  Chief Complaint  Patient presents with  . Dehydration  . Abnormal Lab  . Abdominal Pain    S-Pt presented on  01/19/2016 with  Chief Complaint  Patient presents with  . Dehydration  . Abnormal Lab  . Abdominal Pain  .    Pt unable to offer any complaints.  meds . ALPRAZolam  1 mg Oral BID  . aspirin EC  81 mg Oral q morning - 10a  . busPIRone  10 mg Oral q morning - 10a  . escitalopram  20 mg Oral QHS  . furosemide  40 mg Intravenous BID  . heparin  5,000 Units Subcutaneous Q8H  . levothyroxine  50 mcg Oral QAC breakfast  . mouth rinse  15 mL Mouth Rinse BID  . meropenem (MERREM) IV  500 mg Intravenous Q12H  . OLANZapine  20 mg Oral Daily  . oxybutynin  10 mg Oral q morning - 10a  . pantoprazole  40 mg Oral Daily      Physical Exam: Vital signs in last 24 hours: Temp:  [98.6 F (37 C)-101.8 F (38.8 C)] 100 F (37.8 C) (01/14 0642) Pulse Rate:  [68-69] 69 (01/14 1410) Resp:  [18] 18 (01/14 1410) BP: (108-122)/(40-50) 108/40 (01/14 1410) SpO2:  [98 %-100 %] 98 % (01/14 1410) Weight change:  Last BM Date: 01/17/16  Intake/Output from previous day: 01/13 0701 - 01/14 0700 In: 1005 [P.O.:480; I.V.:525] Out: -  No intake/output data recorded.   Physical Exam: General- pt is awake,does not follow commands Resp- No acute REsp distress, decreased BS at bases. CVS- S1S2 regular in rate and rhythm GIT- BS+, soft, NT, ND, obese EXT- NO LE Edema, Cyanosis  Lab Results: CBC  Recent Labs  01/19/16 1312 01/21/16 0650  WBC 15.4* 6.6  HGB 11.1* 9.6*  HCT 34.7* 29.1*  PLT 210 219    BMET  Recent Labs  01/20/16 0659 01/21/16 0650  NA 136 133*  K 5.6* 6.1*  CL 111 107  CO2 17* 19*  GLUCOSE 94 108*  BUN 58* 53*  CREATININE 4.55* 4.53*  CALCIUM 8.1* 7.8*    Creat 2018  4.5   2.6 ( baseline) 2017  1.7--4.1 2013  1.6--3.1     MICRO Recent Results (from the past 240 hour(s))  Urine culture     Status: Abnormal   Collection Time: 01/17/16 12:29 PM  Result Value Ref Range Status   Specimen Description URINE, CATHETERIZED  Final   Special Requests NONE  Final   Culture (A)  Final    >=100,000 COLONIES/mL ESCHERICHIA COLI Confirmed Extended Spectrum Beta-Lactamase Producer (ESBL) Performed at [facility]    Report Status 01/20/2016 FINAL  Final   Organism ID, Bacteria ESCHERICHIA COLI (A)  Final      Susceptibility   Escherichia coli - MIC*    AMPICILLIN >=32 RESISTANT Resistant     CEFAZOLIN >=64 RESISTANT Resistant     CEFTRIAXONE >=64 RESISTANT Resistant     CIPROFLOXACIN >=4 RESISTANT Resistant     GENTAMICIN >=16 RESISTANT Resistant     IMIPENEM <=0.25 SENSITIVE Sensitive     NITROFURANTOIN <=16 SENSITIVE Sensitive     TRIMETH/SULFA <=20 SENSITIVE Sensitive     AMPICILLIN/SULBACTAM >=32 RESISTANT Resistant     PIP/TAZO 8 SENSITIVE Sensitive     Extended ESBL POSITIVE Resistant     * >=  100,000 COLONIES/mL ESCHERICHIA COLI  Urine culture     Status: None   Collection Time: 01/19/16  3:12 PM  Result Value Ref Range Status   Specimen Description URINE, CLEAN CATCH  Final   Special Requests NONE  Final   Culture NO GROWTH Performed at [facility]   Final   Report Status 01/21/2016 FINAL  Final  Culture, blood (routine x 2)     Status: None (Preliminary result)   Collection Time: 01/21/16  7:00 AM  Result Value Ref Range Status   Specimen Description BLOOD LEFT ANTECUBITAL  Final   Special Requests BOTTLES DRAWN AEROBIC AND ANAEROBIC 6CC  Final   Culture PENDING  Incomplete   Report Status PENDING  Incomplete  Culture, blood (routine x 2)     Status: None (Preliminary result)   Collection Time: 01/21/16  8:40 AM  Result Value Ref Range Status   Specimen Description BLOOD PICC LINE  Final   Special Requests BOTTLES DRAWN  AEROBIC AND ANAEROBIC 6CC  Final   Culture PENDING  Incomplete   Report Status PENDING  Incomplete      Lab Results  Component Value Date   CALCIUM 7.8 (L) 01/21/2016   Albumin 3.3 Corrected calcium 7.8+ 0.5=8.3            Impression: 1)Renal  AKI secondary to Prerenal/ATN/ Post renal               AKI sec to Hypovolemia/Hypotension/Bactrim/ NSAIDS               AKI on CKD               CKD stage 3/4 .               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                Pt renal u/s shows Hydronephrosis and CT scan confirmed this.   2)HTN  BP was low , now better  3)Anemia HGb at goal (9--11)   4)CKD Mineral-Bone Disorder PTH not avail . Secondary Hyperparathyroidism w/u pending. Phosphorus - will check. Calci um near to goal  5)ID-admitted with UTI On IV abx PMD following  6)Electrolytes  Hyperkalemic    AKI, CKD, NSAIDS and spironolactone as outpt    Received kayexalate  NOrmonatremic   7)Acid base Co2 not at goal NON AG acidosis   Plan:  Will suggest to ask for urology help.  Will add lasix sec to Hyperkalemia but with hydronephosis agree with kayexalate May need kayexalate PR.  Pt has stool burden -may need enema      BHUTANI,MANPREET S 01/21/2016, 4:39 PM

## 2016-01-21 NOTE — Anesthesia Preprocedure Evaluation (Addendum)
Anesthesia Evaluation  Patient identified by MRN, date of birth, ID band Patient awake    Reviewed: Allergy & Precautions, NPO status , Patient's Chart, lab work & pertinent test results  Airway Mallampati: II  TM Distance: >3 FB     Dental   Pulmonary neg pulmonary ROS,  History noted. CG   breath sounds clear to auscultation       Cardiovascular hypertension,  Rhythm:Regular Rate:Normal     Neuro/Psych History noted. CG    GI/Hepatic Neg liver ROS, GERD  ,  Endo/Other  diabetesHypothyroidism   Renal/GU Renal disease     Musculoskeletal   Abdominal   Peds  Hematology   Anesthesia Other Findings   Reproductive/Obstetrics                           Anesthesia Physical Anesthesia Plan  ASA: III  Anesthesia Plan: General   Post-op Pain Management:    Induction: Intravenous  Airway Management Planned: Oral ETT  Additional Equipment:   Intra-op Plan:   Post-operative Plan:   Informed Consent: I have reviewed the patients History and Physical, chart, labs and discussed the procedure including the risks, benefits and alternatives for the proposed anesthesia with the patient or authorized representative who has indicated his/her understanding and acceptance.   Dental advisory given  Plan Discussed with: CRNA and Anesthesiologist  Anesthesia Plan Comments:        Anesthesia Quick Evaluation

## 2016-01-21 NOTE — Progress Notes (Signed)
Pharmacy Antibiotic Note  Rachel Morgan is a 66 y.o. female admitted on 01/19/2016 with UTI.  Pharmacy has been consulted for Merrem dosing.  Plan: Decrease Merrem 500mg  IV q12h F/U cxs and clinical progress  Height: 5' (152.4 cm) Weight: 258 lb 1.6 oz (117.1 kg) IBW/kg (Calculated) : 45.5  Temp (24hrs), Avg:99.8 F (37.7 C), Min:98.2 F (36.8 C), Max:101.8 F (38.8 C)   Recent Labs Lab 01/17/16 1237 01/19/16 1312 01/19/16 1646 01/19/16 1854 01/20/16 0659 01/21/16 0650  WBC 14.0* 15.4*  --   --   --  6.6  CREATININE 2.60* 4.49*  --   --  4.55* 4.53*  LATICACIDVEN  --   --  0.8 2.0*  --   --     Estimated Creatinine Clearance: 14.9 mL/min (by C-G formula based on SCr of 4.53 mg/dL (H)).    Allergies  Allergen Reactions  . Macrobid [Nitrofurantoin Monohyd Macro] Hives and Swelling  . Penicillins Swelling and Rash    Has patient had a PCN reaction causing immediate rash, facial/tongue/throat swelling, SOB or lightheadedness with hypotension: Yes Has patient had a PCN reaction causing severe rash involving mucus membranes or skin necrosis: Yes Has patient had a PCN reaction that required hospitalization: no Has patient had a PCN reaction occurring within the last 10 years: no If all of the above answers are "NO", then may proceed with Cephalosporin use. Patient tolerated rocephin and ertapenem in the past    Antimicrobials this admission: merrem 1/12 >>   Dose adjustments this admission: 1/14 Merrem adjusted to 500mg  IV q12h   Microbiology results: 1/12 UCx: no growth 1/14 BCx:  Pending  Thank you for allowing pharmacy to be a part of this patient's care. Isac Sarna, BS Pharm D, [place] Clinical Pharmacist Pager [phone] 01/21/2016 11:59 AM

## 2016-01-21 NOTE — Progress Notes (Signed)
Late entry:  Patient had orders for two enemas.  Dr. Roderic Palau gave order to administer the milk and molasses enema prior to patient transfer.  Patient has had results from enema. [facility] notified of patient to transfer to [facility] as soon as possible.  Oncoming night nurse notified.

## 2016-01-21 NOTE — Progress Notes (Signed)
PROGRESS NOTE    Rachel Morgan  QQI:[id number] DOB: 1950/06/24 DOA: 01/19/2016 PCP: [facility]    Brief Narrative:  66 year old female with a history of cerebral palsy, recurrent UTIs, CKD stage III, history of kidney stone s/p left urteral stent by Dr. Jeffie Pollock in 10/2015, presented with decreased urine output/dark urine. She was found to have a urinary tract infection with Escherichia coli and worsening renal failure with a creatinine of 4.5. She was started on IV fluids and IV antibiotics. Nephrology following patient and changed fluid to bicarbonate infusion. She began developing fevers and CT abd revealed 42mm stone moderate right hydronephrosis. Case discussed with urology who felt that she would need a ureteral stent and requested patient to be transferred to [facility].   Assessment & Plan:   Principal Problem:   AKI (acute kidney injury) ([place]) Active Problems:   Dehydration   Hypothyroidism   Hypertension   CKD (chronic kidney disease), stage III   Urinary tract infectious disease   Cerebral palsy ([place])   1. AKI on CKD 3. Patient's baseline creatinine is 1.3-1.7. She is presented with a creatinine of 4.5. Etiologies include dehydration/NSAID use/recent Bactrim as well as post obstructive uropathy with hydronephrosis. Nephrology is following. Patient is currently on IV fluids with bicarbonate. Creatinine has failed to show significant improvement. Overnight she has developed fevers. Abd CT shows right sided renal stone with hydronephrosis. Case discussed with Dr. Alyson Ingles with urology who felt that she would likely need a ureteral stent and requested transfer to [facility]. .  2. Urinary tract infection. Recent culture drawn on 1/10 positive for Escherichia coli, ESBL. Currently on Meropenem.  3. Hyperkalemia. Likely related to renal failure/bactrim and spironolactone. On IV fluids with bicarbonate. Check EKG. Monitor on telemetry. She has received kayexalate today.  Since she has significant stool in rectum and has not had a bowel movement, will give enema. Continue to follow  4. Hypothyroidism. Continue synthroid.  5. Cerebral palsy.  6. Anemia, suspect this is related to chronic renal disease. No signs of bleeding. Continue to follow.  7. Metabolic acidosis. Lactic acid normal. Suspect this is related to renal disease. On bicarb infusion.     DVT prophylaxis: heparin Code Status: full code Family Communication: discussed with sisters at bedside Disposition Plan: Transfer to [facility] for further management   Consultants:   Nephrology  urology  Procedures:   PICC line 1/13>>  Antimicrobials:   Meropenem 1/12>>   Subjective: Patient is not able to effectively verbally communicate  Objective: Vitals:   01/20/16 2200 01/21/16 0454 01/21/16 0642 01/21/16 1410  BP:  (!) 122/50  (!) 108/40  Pulse:  68  69  Resp:  18  18  Temp: 98.6 F (37 C) (!) 101.8 F (38.8 C) 100 F (37.8 C)   TempSrc: Oral Oral Oral   SpO2:  100%  98%  Weight:      Height:        Intake/Output Summary (Last 24 hours) at 01/21/16 1716 Last data filed at 01/21/16 0300  Gross per 24 hour  Intake              525 ml  Output                0 ml  Net              525 ml   Filed Weights   01/19/16 1207 01/19/16 2059  Weight: 124.7 kg (275 lb) 117.1 kg (258 lb  1.6 oz)    Examination:  General exam: Appears calm and comfortable  Respiratory system: Clear to auscultation. Respiratory effort normal. Cardiovascular system: S1 & S2 heard, RRR. No JVD, murmurs, rubs, gallops or clicks. No pedal edema. Gastrointestinal system: Abdomen is nondistended, soft and nontender. No organomegaly or masses felt. Normal bowel sounds heard. Central nervous system: Alert. No focal neurological deficits. Extremities: Symmetric 5 x 5 power. Skin: No rashes, lesions or ulcers Psychiatry: unable to communicate due to MR     Data Reviewed: I have personally reviewed  following labs and imaging studies  CBC:  Recent Labs Lab 01/17/16 1237 01/19/16 1312 01/21/16 0650  WBC 14.0* 15.4* 6.6  NEUTROABS 12.8*  --   --   HGB 11.1* 11.1* 9.6*  HCT 35.5* 34.7* 29.1*  MCV 91.5 91.3 89.5  PLT 273 210 696   Basic Metabolic Panel:  Recent Labs Lab 01/17/16 1237 01/19/16 1312 01/20/16 0659 01/21/16 0650  NA 141 137 136 133*  K 5.6* 5.5* 5.6* 6.1*  CL 109 110 111 107  CO2 23 17* 17* 19*  GLUCOSE 138* 123* 94 108*  BUN 45* 62* 58* 53*  CREATININE 2.60* 4.49* 4.55* 4.53*  CALCIUM 9.3 9.0 8.1* 7.8*   GFR: Estimated Creatinine Clearance: 14.9 mL/min (by C-G formula based on SCr of 4.53 mg/dL (H)). Liver Function Tests:  Recent Labs Lab 01/17/16 1237 01/19/16 1312  AST 20 24  ALT 15 16  ALKPHOS 91 78  BILITOT 0.4 0.3  PROT 8.4* 7.9  ALBUMIN 3.6 3.3*    Recent Labs Lab 01/19/16 1312  LIPASE 11   No results for input(s): AMMONIA in the last 168 hours. Coagulation Profile: No results for input(s): INR, PROTIME in the last 168 hours. Cardiac Enzymes: No results for input(s): CKTOTAL, CKMB, CKMBINDEX, TROPONINI in the last 168 hours. BNP (last 3 results) No results for input(s): PROBNP in the last 8760 hours. HbA1C: No results for input(s): HGBA1C in the last 72 hours. CBG: No results for input(s): GLUCAP in the last 168 hours. Lipid Profile: No results for input(s): CHOL, HDL, LDLCALC, TRIG, CHOLHDL, LDLDIRECT in the last 72 hours. Thyroid Function Tests:  Recent Labs  01/19/16 1323  TSH 5.895*   Anemia Panel: No results for input(s): VITAMINB12, FOLATE, FERRITIN, TIBC, IRON, RETICCTPCT in the last 72 hours. Sepsis Labs:  Recent Labs Lab 01/19/16 1646 01/19/16 1854  LATICACIDVEN 0.8 2.0*    Recent Results (from the past 240 hour(s))  Urine culture     Status: Abnormal   Collection Time: 01/17/16 12:29 PM  Result Value Ref Range Status   Specimen Description URINE, CATHETERIZED  Final   Special Requests NONE  Final     Culture (A)  Final    >=100,000 COLONIES/mL ESCHERICHIA COLI Confirmed Extended Spectrum Beta-Lactamase Producer (ESBL) Performed at [facility]    Report Status 01/20/2016 FINAL  Final   Organism ID, Bacteria ESCHERICHIA COLI (A)  Final      Susceptibility   Escherichia coli - MIC*    AMPICILLIN >=32 RESISTANT Resistant     CEFAZOLIN >=64 RESISTANT Resistant     CEFTRIAXONE >=64 RESISTANT Resistant     CIPROFLOXACIN >=4 RESISTANT Resistant     GENTAMICIN >=16 RESISTANT Resistant     IMIPENEM <=0.25 SENSITIVE Sensitive     NITROFURANTOIN <=16 SENSITIVE Sensitive     TRIMETH/SULFA <=20 SENSITIVE Sensitive     AMPICILLIN/SULBACTAM >=32 RESISTANT Resistant     PIP/TAZO 8 SENSITIVE Sensitive     Extended  ESBL POSITIVE Resistant     * >=100,000 COLONIES/mL ESCHERICHIA COLI  Urine culture     Status: None   Collection Time: 01/19/16  3:12 PM  Result Value Ref Range Status   Specimen Description URINE, CLEAN CATCH  Final   Special Requests NONE  Final   Culture NO GROWTH Performed at [facility]   Final   Report Status 01/21/2016 FINAL  Final  Culture, blood (routine x 2)     Status: None (Preliminary result)   Collection Time: 01/21/16  7:00 AM  Result Value Ref Range Status   Specimen Description BLOOD LEFT ANTECUBITAL  Final   Special Requests BOTTLES DRAWN AEROBIC AND ANAEROBIC 6CC  Final   Culture PENDING  Incomplete   Report Status PENDING  Incomplete  Culture, blood (routine x 2)     Status: None (Preliminary result)   Collection Time: 01/21/16  8:40 AM  Result Value Ref Range Status   Specimen Description BLOOD PICC LINE  Final   Special Requests BOTTLES DRAWN AEROBIC AND ANAEROBIC [facility]  Final   Culture PENDING  Incomplete   Report Status PENDING  Incomplete         Radiology Studies: US Renal  Result Date: 01/20/2016 CLINICAL DATA:  Acute kidney injury. History of a left ureteral stent. EXAM: RENAL / URINARY TRACT ULTRASOUND COMPLETE  COMPARISON:  CT, 12/11/2015 FINDINGS: Right Kidney: Limited visualization due to bowel gas and shadowing gallstones. Kidney does not appear enlarged. There is evidence of mild right hydronephrosis. Left Kidney: Length: 10.9 cm. Mild renal cortical thinning. Normal parenchymal echogenicity. 7 mm stone in the lower pole. No hydronephrosis. Bladder: Appears normal for degree of bladder distention. IMPRESSION: 1. Incompletely imaged right kidney. Evidence of mild right hydronephrosis. 2. Left intrarenal stone.  No left hydronephrosis. 3. Multiple gallstones. Electronically Signed   By: Lajean Manes M.D.   On: 01/20/2016 15:02   Dg Chest Port 1 View  Result Date: 01/20/2016 CLINICAL DATA:  PICC line placement EXAM: PORTABLE CHEST 1 VIEW COMPARISON:  January 19, 2016 FINDINGS: The right PICC line terminates below the brachiocephalic confluence in the SVC. Mild edema. Small amount of fluid in the right fissure. Stable cardiomegaly. No other change. IMPRESSION: The PICC line is in good position. There is mild pulmonary edema and probable fluid in the right fissure. Electronically Signed   By: Dorise Bullion III M.D   On: 01/20/2016 12:05   Ct Renal Stone Study  Result Date: 01/21/2016 CLINICAL DATA:  Hydronephrosis, Frequent UTI'S. HX OF cerebral palsy, CKD III, GERD,HTN, kidney stones, gall stones EXAM: CT ABDOMEN AND PELVIS WITHOUT CONTRAST TECHNIQUE: Multidetector CT imaging of the abdomen and pelvis was performed following the standard protocol without IV contrast. COMPARISON:  Ultrasound, 01/20/2016.  CT, 12/11/2015. FINDINGS: Lower chest: No acute abnormality. Hepatobiliary: Liver is unremarkable. There multiple gallstones. No gallbladder wall thickening or inflammation. No bile duct dilation. Pancreas: Partial fatty replacement of pancreas. No pancreatic mass or evidence of inflammation. Spleen: Normal in size without focal abnormality. Adrenals/Urinary Tract: No adrenal masses. Moderate right  hydronephrosis and hydroureter. This is due to a 7 mm distal ureteral stone. No other right ureteral stones. No left ureteral stone or hydronephrosis. Small nonobstructing stone in the lower pole of the right kidney. There are multiple nonobstructing stones in the left kidney. There is left renal cortical thinning. The bladder is unremarkable. Stomach/Bowel: Stomach and small bowel unremarkable. Mild to moderate increased stool is noted throughout the colon and in the rectum. No  colonic wall thickening or inflammation. No evidence of bowel obstruction. Normal appendix visualized. Vascular/Lymphatic: Minimal aortic atherosclerotic calcifications. No adenopathy. Reproductive: Uterus and bilateral adnexa are unremarkable. Other: No abdominal wall hernia or abnormality. No abdominopelvic ascites. Musculoskeletal: No fracture or acute finding. No osteoblastic or osteolytic lesions. IMPRESSION: 1. 7 mm stone in the distal right ureter causes moderate right hydronephrosis. 2. No other acute abnormality. 3. Bilateral nonobstructing intrarenal stones. Chronic left renal cortical thinning/scarring. 4. Gallstones.  No acute cholecystitis. 5. Mild to moderate increased stool noted throughout the colon and in the rectum. Electronically Signed   By: Lajean Manes M.D.   On: 01/21/2016 16:21        Scheduled Meds: . ALPRAZolam  1 mg Oral BID  . aspirin EC  81 mg Oral q morning - 10a  . busPIRone  10 mg Oral q morning - 10a  . escitalopram  20 mg Oral QHS  . heparin  5,000 Units Subcutaneous Q8H  . levothyroxine  50 mcg Oral QAC breakfast  . mouth rinse  15 mL Mouth Rinse BID  . meropenem (MERREM) IV  500 mg Intravenous Q12H  . milk and molasses  1 enema Rectal Once  . OLANZapine  20 mg Oral Daily  . oxybutynin  10 mg Oral q morning - 10a  . pantoprazole  40 mg Oral Daily  . sodium polystyrene  45 g Rectal Once   Continuous Infusions: . sodium chloride 0.45 % 1,000 mL with sodium bicarbonate 50 mEq infusion  100 mL/hr at 01/21/16 1411     LOS: 2 days    Time spent: 22mins    Kathie Dike, MD [facility] Pager [phone]  If 7PM-7AM, please contact night-coverage [email or web address] Password [facility] 01/21/2016, 5:16 PM

## 2016-01-21 NOTE — Progress Notes (Signed)
[facility] to arrive at approximately 1900 for transport to [facility].  Dr. Roderic Palau notified.  Dr. Harle Battiest will be receiving.

## 2016-01-21 NOTE — Consult Note (Signed)
Rachel Morgan 1950-01-10 [id number]  Referring provider: Dr. Pearletha Forge  Chief Complaint  Patient presents with  . Dehydration  . Abnormal Lab  . Abdominal Pain    HPI: The patient is a 66 year old female with a past medical history of nephrolithiasis who presented to the hospital with right flank pain and acute on chronic renal failure and was diagnosed with a urinary tract infection. Urology was consulted today after finding a 7 mm right distal ureteral stone obstructing a functionally solitary right kidney.  Her creatinine currently is approximate 4.5 with a baseline proximal 1.5. She is on meropenem for ESBL Escherichia coli urinary tract infection. Of note, the patient does have serial pollicis difficult to obtain a history from her. History was obtained from her sister who was bedside. The patient does however complain of right flank pain. At the time of admission her white blood cell count was approximately 15, but it has now normalized. She spiked a fever to 101.8 earlier today.  She has had ureteroscopy for her nephrolithiasis in the past. Most recently, it was last fall of 2017 where she was seen by Dr. Alyson Ingles and Dr. Jeffie Pollock.  PMH: Past Medical History:  Diagnosis Date  . Anxiety   . Calculus of gallbladder   . Cerebral palsy ([place])   . Chronic kidney disease   . Diabetes mellitus   . Gall stones   . GERD (gastroesophageal reflux disease)   . High cholesterol   . History of kidney stones   . Hypertension   . Hypothyroidism   . Kidney stones   . MR (mental retardation)   . Pyelonephritis   . Sepsis [facility])     Surgical History: Past Surgical History:  Procedure Laterality Date  . CYSTOSCOPY W/ URETERAL STENT PLACEMENT Bilateral 09/26/2015   Procedure: CYSTOSCOPY WITH Bilateral  RETROGRADE PYELOGRAM/URETERAL STENT PLACEMENT;  Surgeon: [name], MD;  Location: [facility];  Service: Urology;  Laterality: Bilateral;  . CYSTOSCOPY W/ URETERAL STENT REMOVAL Left  11/10/2015   Procedure: CYSTOSCOPY WITH STENT REMOVAL;  Surgeon: Cleon Gustin, MD;  Location: [facility];  Service: Urology;  Laterality: Left;  . CYSTOSCOPY/RETROGRADE/URETEROSCOPY/STONE EXTRACTION WITH BASKET Left 10/31/2015   Procedure: CYSTOSCOPY/ BILATERAL URETEROSCOPY/BILATERAL STONE EXTRACTION/ LEFT STENT PLACEMENT;  Surgeon: Irine Seal, MD;  Location: [facility];  Service: Urology;  Laterality: Left;  . HOLMIUM LASER APPLICATION N/A 00/17/4944   Procedure: HOLMIUM LASER APPLICATION;  Surgeon: Irine Seal, MD;  Location: [facility];  Service: Urology;  Laterality: N/A;    Allergies:  Allergies  Allergen Reactions  . Macrobid [Nitrofurantoin Monohyd Macro] Hives and Swelling  . Penicillins Swelling and Rash    Has patient had a PCN reaction causing immediate rash, facial/tongue/throat swelling, SOB or lightheadedness with hypotension: Yes Has patient had a PCN reaction causing severe rash involving mucus membranes or skin necrosis: Yes Has patient had a PCN reaction that required hospitalization: no Has patient had a PCN reaction occurring within the last 10 years: no If all of the above answers are "NO", then may proceed with Cephalosporin use. Patient tolerated rocephin and ertapenem in the past    Family History: Family History  Problem Relation Age of Onset  . Family history unknown: Yes    Social History:  reports that she has never smoked. She has never used smokeless tobacco. She reports that she does not drink alcohol or use drugs.  ROS: 12 point ROS negative except for above  Physical Exam: BP 131/63 (BP Location: Left Wrist)   Pulse 62   Temp 98.1 F (36.7 C) (Oral)   Resp 18   Ht 5' (1.524 m)   Wt 258 lb 1.6 oz (117.1 kg)   SpO2 99%   BMI 50.41 kg/m   Constitutional:  Alert and oriented, No acute distress. HEENT: [place] AT, moist mucus membranes.  Trachea midline, no masses. Cardiovascular: No clubbing,  cyanosis, or edema. Respiratory: Normal respiratory effort, no increased work of breathing. GI: Abdomen is soft, nontender, nondistended, no abdominal masses GU: +Right CVA tenderness.  Skin: No rashes, bruises or suspicious lesions. Lymph: No cervical or inguinal adenopathy. Neurologic: Grossly intact, no focal deficits, moving all 4 extremities. Psychiatric: Normal mood and affect.  Laboratory Data: Lab Results  Component Value Date   WBC 6.6 01/21/2016   HGB 9.6 (L) 01/21/2016   HCT 29.1 (L) 01/21/2016   MCV 89.5 01/21/2016   PLT 219 01/21/2016    Lab Results  Component Value Date   CREATININE 4.50 (H) 01/21/2016    No results found for: PSA  No results found for: TESTOSTERONE  No results found for: HGBA1C  Urinalysis    Component Value Date/Time   COLORURINE YELLOW 01/19/2016 1600   APPEARANCEUR HAZY (A) 01/19/2016 1600   LABSPEC 1.015 01/19/2016 1600   PHURINE 5.0 01/19/2016 1600   GLUCOSEU NEGATIVE 01/19/2016 1600   HGBUR MODERATE (A) 01/19/2016 1600   BILIRUBINUR NEGATIVE 01/19/2016 1600   KETONESUR NEGATIVE 01/19/2016 1600   PROTEINUR 30 (A) 01/19/2016 1600   UROBILINOGEN 1.0 02/11/2011 2035   NITRITE NEGATIVE 01/19/2016 1600   LEUKOCYTESUR LARGE (A) 01/19/2016 1600    Pertinent Imaging: CLINICAL DATA:  Hydronephrosis, Frequent UTI'S. HX OF cerebral palsy, CKD III, GERD,HTN, kidney stones, gall stones  EXAM: CT ABDOMEN AND PELVIS WITHOUT CONTRAST  TECHNIQUE: Multidetector CT imaging of the abdomen and pelvis was performed following the standard protocol without IV contrast.  COMPARISON:  Ultrasound, 01/20/2016.  CT, 12/11/2015.  FINDINGS: Lower chest: No acute abnormality.  Hepatobiliary: Liver is unremarkable. There multiple gallstones. No gallbladder wall thickening or inflammation. No bile duct dilation.  Pancreas: Partial fatty replacement of pancreas. No pancreatic mass or evidence of inflammation.  Spleen: Normal in size  without focal abnormality.  Adrenals/Urinary Tract: No adrenal masses.  Moderate right hydronephrosis and hydroureter. This is due to a 7 mm distal ureteral stone. No other right ureteral stones. No left ureteral stone or hydronephrosis. Small nonobstructing stone in the lower pole of the right kidney. There are multiple nonobstructing stones in the left kidney. There is left renal cortical thinning. The bladder is unremarkable.  Stomach/Bowel: Stomach and small bowel unremarkable. Mild to moderate increased stool is noted throughout the colon and in the rectum. No colonic wall thickening or inflammation. No evidence of bowel obstruction. Normal appendix visualized.  Vascular/Lymphatic: Minimal aortic atherosclerotic calcifications. No adenopathy.  Reproductive: Uterus and bilateral adnexa are unremarkable.  Other: No abdominal wall hernia or abnormality. No abdominopelvic ascites.  Musculoskeletal: No fracture or acute finding. No osteoblastic or osteolytic lesions.  IMPRESSION: 1. 7 mm stone in the distal right ureter causes moderate right hydronephrosis. 2. No other acute abnormality. 3. Bilateral nonobstructing intrarenal stones. Chronic left renal cortical thinning/scarring. 4. Gallstones.  No acute cholecystitis. 5. Mild to moderate increased stool noted throughout the colon and in the rectum.  Assessment & Plan:    1. Right ureteral stone 2. Nonobstructing bilateral stones 3. UTI 4. Sepsis I discussed with the patient's sister the  need for emergent intervention due to her serious acute on chronic renal failure as due to a ureteral stone obstructing her essentially solitary right kidney. She also has signs of impending sepsis given her concurrent urinary tract infection. We discussed the need for emergent cystoscopy with right ureteral stent placement. Discussed risks, benefits, indications of this procedure. All questions were answered. The patient's sister  is agreeable to proceeding. We will continue her meropenem for her urinary tract infection at this time.   Nickie Retort, MD

## 2016-01-22 ENCOUNTER — Encounter (HOSPITAL_COMMUNITY): Payer: Self-pay | Admitting: Urology

## 2016-01-22 ENCOUNTER — Inpatient Hospital Stay (HOSPITAL_COMMUNITY): Payer: Medicare Other | Admitting: Certified Registered Nurse Anesthetist

## 2016-01-22 DIAGNOSIS — Z452 Encounter for adjustment and management of vascular access device: Secondary | ICD-10-CM

## 2016-01-22 DIAGNOSIS — N390 Urinary tract infection, site not specified: Secondary | ICD-10-CM

## 2016-01-22 LAB — RETICULOCYTES
RBC.: 3.08 MIL/uL — ABNORMAL LOW (ref 3.87–5.11)
Retic Count, Absolute: 40 10*3/uL (ref 19.0–186.0)
Retic Ct Pct: 1.3 % (ref 0.4–3.1)

## 2016-01-22 LAB — IRON AND TIBC
Iron: 12 ug/dL — ABNORMAL LOW (ref 28–170)
Saturation Ratios: 8 % — ABNORMAL LOW (ref 10.4–31.8)
TIBC: 150 ug/dL — AB (ref 250–450)
UIBC: 138 ug/dL

## 2016-01-22 LAB — CBC WITH DIFFERENTIAL/PLATELET
Basophils Absolute: 0 10*3/uL (ref 0.0–0.1)
Basophils Relative: 0 %
Eosinophils Absolute: 0.3 10*3/uL (ref 0.0–0.7)
Eosinophils Relative: 4 %
HCT: 27.1 % — ABNORMAL LOW (ref 36.0–46.0)
Hemoglobin: 8.8 g/dL — ABNORMAL LOW (ref 12.0–15.0)
Lymphocytes Relative: 16 %
Lymphs Abs: 1.1 10*3/uL (ref 0.7–4.0)
MCH: 28.6 pg (ref 26.0–34.0)
MCHC: 32.5 g/dL (ref 30.0–36.0)
MCV: 88 fL (ref 78.0–100.0)
Monocytes Absolute: 1.2 10*3/uL — ABNORMAL HIGH (ref 0.1–1.0)
Monocytes Relative: 18 %
Neutro Abs: 4.2 10*3/uL (ref 1.7–7.7)
Neutrophils Relative %: 62 %
Platelets: 182 10*3/uL (ref 150–400)
RBC: 3.08 MIL/uL — ABNORMAL LOW (ref 3.87–5.11)
RDW: 15.2 % (ref 11.5–15.5)
WBC: 6.8 10*3/uL (ref 4.0–10.5)

## 2016-01-22 LAB — RENAL FUNCTION PANEL
ALBUMIN: 2.2 g/dL — AB (ref 3.5–5.0)
Anion gap: 6 (ref 5–15)
BUN: 53 mg/dL — AB (ref 6–20)
CO2: 22 mmol/L (ref 22–32)
Calcium: 7.9 mg/dL — ABNORMAL LOW (ref 8.9–10.3)
Chloride: 110 mmol/L (ref 101–111)
Creatinine, Ser: 3.98 mg/dL — ABNORMAL HIGH (ref 0.44–1.00)
GFR calc Af Amer: 13 mL/min — ABNORMAL LOW (ref >60)
GFR calc non Af Amer: 11 mL/min — ABNORMAL LOW (ref >60)
GLUCOSE: 84 mg/dL (ref 65–99)
PHOSPHORUS: 4.4 mg/dL (ref 2.5–4.6)
POTASSIUM: 5.9 mmol/L — AB (ref 3.5–5.1)
SODIUM: 138 mmol/L (ref 135–145)

## 2016-01-22 LAB — FERRITIN: Ferritin: 1131 ng/mL — ABNORMAL HIGH (ref 11–307)

## 2016-01-22 LAB — LACTIC ACID, PLASMA: Lactic Acid, Venous: 0.6 mmol/L (ref 0.5–1.9)

## 2016-01-22 LAB — FOLATE: Folate: 19.3 ng/mL (ref >5.9)

## 2016-01-22 LAB — VITAMIN B12: Vitamin B-12: 270 pg/mL (ref 180–914)

## 2016-01-22 MED ORDER — PROPOFOL 10 MG/ML IV BOLUS
INTRAVENOUS | Status: AC
  Filled 2016-01-22: qty 20

## 2016-01-22 MED ORDER — FENTANYL CITRATE (PF) 100 MCG/2ML IJ SOLN: 25.0000 ug | INTRAMUSCULAR | Status: DC | PRN

## 2016-01-22 MED ORDER — FENTANYL CITRATE (PF) 100 MCG/2ML IJ SOLN
INTRAMUSCULAR | Status: DC | PRN
  Administered 2016-01-22: 50 ug via INTRAVENOUS

## 2016-01-22 MED ORDER — LACTATED RINGERS IV SOLN
INTRAVENOUS | Status: DC | PRN
  Administered 2016-01-22: 01:00:00 via INTRAVENOUS

## 2016-01-22 MED ORDER — SODIUM CHLORIDE 0.9% FLUSH
10.0000 mL | INTRAVENOUS | Status: DC | PRN
  Administered 2016-01-22 – 2016-01-24 (×2): 10 mL
  Filled 2016-01-22: qty 40

## 2016-01-22 MED ORDER — DOCUSATE SODIUM 283 MG RE ENEM
1.0000 | ENEMA | RECTAL | Status: DC | PRN
Start: 2016-01-22 — End: 2016-01-26
  Filled 2016-01-22: qty 1

## 2016-01-22 MED ORDER — NEPRO/CARBSTEADY PO LIQD
237.0000 mL | Freq: Three times a day (TID) | ORAL | Status: DC | PRN
  Filled 2016-01-22: qty 237

## 2016-01-22 MED ORDER — ONDANSETRON HCL 4 MG PO TABS: 4.0000 mg | ORAL_TABLET | Freq: Four times a day (QID) | ORAL | Status: DC | PRN

## 2016-01-22 MED ORDER — ONDANSETRON HCL 4 MG/2ML IJ SOLN: 4.0000 mg | Freq: Four times a day (QID) | INTRAMUSCULAR | Status: DC | PRN

## 2016-01-22 MED ORDER — STERILE WATER FOR IRRIGATION IR SOLN
Status: DC | PRN
  Administered 2016-01-22: 3000 mL

## 2016-01-22 MED ORDER — SORBITOL 70 % SOLN: 30.0000 mL | Status: DC | PRN

## 2016-01-22 MED ORDER — MIDAZOLAM HCL 2 MG/2ML IJ SOLN
INTRAMUSCULAR | Status: AC
  Filled 2016-01-22: qty 2

## 2016-01-22 MED ORDER — SENNOSIDES-DOCUSATE SODIUM 8.6-50 MG PO TABS
1.0000 | ORAL_TABLET | Freq: Two times a day (BID) | ORAL | Status: DC
  Administered 2016-01-22 – 2016-01-26 (×8): 1 via ORAL
  Filled 2016-01-22 (×8): qty 1

## 2016-01-22 MED ORDER — LIDOCAINE HCL (CARDIAC) 20 MG/ML IV SOLN
INTRAVENOUS | Status: DC | PRN
  Administered 2016-01-22: 80 mg via INTRAVENOUS

## 2016-01-22 MED ORDER — PROPOFOL 10 MG/ML IV BOLUS
INTRAVENOUS | Status: DC | PRN
  Administered 2016-01-22: 150 mg via INTRAVENOUS

## 2016-01-22 MED ORDER — SODIUM CHLORIDE 0.9 % IV SOLN
INTRAVENOUS | Status: DC | PRN
  Administered 2016-01-22: 01:00:00 via INTRAVENOUS

## 2016-01-22 MED ORDER — SODIUM POLYSTYRENE SULFONATE 15 GM/60ML PO SUSP
45.0000 g | Freq: Once | ORAL | Status: AC
  Administered 2016-01-22: 45 g via ORAL
  Filled 2016-01-22: qty 180

## 2016-01-22 MED ORDER — ONDANSETRON HCL 4 MG/2ML IJ SOLN
INTRAMUSCULAR | Status: AC
  Filled 2016-01-22: qty 2

## 2016-01-22 MED ORDER — FENTANYL CITRATE (PF) 100 MCG/2ML IJ SOLN
INTRAMUSCULAR | Status: AC
  Filled 2016-01-22: qty 2

## 2016-01-22 MED ORDER — CAMPHOR-MENTHOL 0.5-0.5 % EX LOTN
1.0000 "application " | TOPICAL_LOTION | Freq: Three times a day (TID) | CUTANEOUS | Status: DC | PRN
  Filled 2016-01-22: qty 222

## 2016-01-22 MED ORDER — HYDROXYZINE HCL 25 MG PO TABS: 25.0000 mg | ORAL_TABLET | Freq: Three times a day (TID) | ORAL | Status: DC | PRN

## 2016-01-22 MED ORDER — CALCIUM CARBONATE ANTACID 1250 MG/5ML PO SUSP
500.0000 mg | Freq: Four times a day (QID) | ORAL | Status: DC | PRN
  Filled 2016-01-22: qty 5

## 2016-01-22 MED ORDER — SODIUM CHLORIDE 0.9 % IV SOLN
INTRAVENOUS | Status: DC | PRN
  Administered 2016-01-22: 01:00:00
  Administered 2016-01-22: 7 mL

## 2016-01-22 NOTE — Progress Notes (Signed)
PROGRESS NOTE    Rachel Morgan  PPJ:[id number] DOB: 10/22/1950 DOA: 01/19/2016 PCP: [facility]    Brief Narrative:  66 year old female with a history of cerebral palsy, recurrent UTIs, CKD stage III, history of kidney stone s/p left urteral stent by Dr. Jeffie Pollock in 10/2015, presented with decreased urine output/dark urine. She was found to have a urinary tract infection with Escherichia coli and worsening renal failure with a creatinine of 4.5. She was started on IV fluids and IV antibiotics. Nephrology following patient and changed fluid to bicarbonate infusion. She began developing fevers and CT abd revealed 40mm stone moderate right hydronephrosis. Case discussed with urology who felt that she would need a ureteral stent and requested patient to be transferred to [facility].   Assessment & Plan:   Principal Problem:   AKI (acute kidney injury) ([place]) Active Problems:   Dehydration   Hypothyroidism   Hypertension   CKD (chronic kidney disease), stage III   Urinary tract infectious disease   Cerebral palsy (HCC)   Hydronephrosis with renal and ureteral calculus obstruction   Hyperkalemia   1. AKI on CKD 3. Patient's baseline creatinine is 1.3-1.7. She is presented with a creatinine of 4.5. Etiologies include dehydration/NSAID use/recent Bactrim as well as post obstructive uropathy with hydronephrosis. Nephrology was following at [facility]. Patient currently on IV fluids with bicarbonate. Creatinine  failed to show significant improvement, patient developed fevers. Abd CT shows right sided renal stone with hydronephrosis. Case discussed with Dr. Alyson Ingles with urology who felt that she would likely need a ureteral stent and requested transfer to [facility]. Patient was transferred to [facility] and underwent cystoscopy with right double J stent placement 01/22/2016. Renal function trending down. Follow.  2. ESBL E Coli Urinary tract infection. Recent culture drawn on 1/10  positive for Escherichia coli, ESBL. Currently on Meropenem.  3. Hyperkalemia. Likely related to renal failure/bactrim and spironolactone. On IV fluids with bicarbonate. EKG with no peaked T waves. Monitor on telemetry. She has received kayexalate yesterday. Will give dose of kayexalate today. Since she had significant stool in rectum and has not had a bowel movement,patient was given an enema with good results.   4. Hypothyroidism. Continue synthroid.  5. Cerebral palsy.  6. Anemia, suspect this is related to chronic renal disease and iron deficiency anemia. No signs of bleeding. Continue to follow.  7. Metabolic acidosis. Lactic acid normal. Improving with bicarbonate drip. Suspect this is related to renal disease.      DVT prophylaxis: heparin Code Status: full code Family Communication: discussed with sisters at bedside Disposition Plan: Home once renal function back to baseline, resolution of hyperkalemia and per urology.   Consultants:   Nephrology: Dr. Theador Hawthorne 01/20/2016  Urology/; Dr Pilar Jarvis 01/21/2016  Procedures:   PICC line 1/13>> Cystoscopy, right retrograde ureteropyelogram with fluoroscopic interpretation, right double-J stent placement-6 French by 24 centimeter without tether--Dr. Diona Fanti 01/22/2015  Antimicrobials:   Meropenem 1/12>>   Subjective: Patient is not able to effectively verbally communicate. Patient sleeping comfortably.  Objective: Vitals:   01/22/16 0200 01/22/16 0230 01/22/16 0249 01/22/16 0530  BP: (!) 103/45  (!) 103/48 (!) 113/43  Pulse:   64 (!) 54  Resp:   (!) 28 (!) 24  Temp:  98 F (36.7 C)  98.4 F (36.9 C)  TempSrc:    Oral  SpO2:  96% 96% 98%  Weight:      Height:        Intake/Output Summary (Last 24  hours) at 01/22/16 1051 Last data filed at 01/22/16 1039  Gross per 24 hour  Intake             2960 ml  Output              300 ml  Net             2660 ml   Filed Weights   01/19/16 1207 01/19/16 2059  Weight: 124.7  kg (275 lb) 117.1 kg (258 lb 1.6 oz)    Examination:  General exam: Sleeping. Respiratory system: Clear to auscultation anterior lung fields. Respiratory effort normal. Cardiovascular system: S1 & S2 heard, RRR. No JVD, murmurs, rubs, gallops or clicks. No pedal edema. Gastrointestinal system: Abdomen is nondistended, soft and nontender. No organomegaly or masses felt. Normal bowel sounds heard. Central nervous system: Alert. No focal neurological deficits. Extremities: Symmetric 5 x 5 power. Skin: No rashes, lesions or ulcers Psychiatry: unable to communicate due to MR     Data Reviewed: I have personally reviewed following labs and imaging studies  CBC:  Recent Labs Lab 01/17/16 1237 01/19/16 1312 01/21/16 0650  WBC 14.0* 15.4* 6.6  NEUTROABS 12.8*  --   --   HGB 11.1* 11.1* 9.6*  HCT 35.5* 34.7* 29.1*  MCV 91.5 91.3 89.5  PLT 273 210 503   Basic Metabolic Panel:  Recent Labs Lab 01/17/16 1237 01/19/16 1312 01/20/16 0659 01/21/16 0650 01/21/16 1928  NA 141 137 136 133* 134*  K 5.6* 5.5* 5.6* 6.1* 5.9*  CL 109 110 111 107 105  CO2 23 17* 17* 19* 21*  GLUCOSE 138* 123* 94 108* 107*  BUN 45* 62* 58* 53* 53*  CREATININE 2.60* 4.49* 4.55* 4.53* 4.50*  CALCIUM 9.3 9.0 8.1* 7.8* 8.2*   GFR: Estimated Creatinine Clearance: 15 mL/min (by C-G formula based on SCr of 4.5 mg/dL (H)). Liver Function Tests:  Recent Labs Lab 01/17/16 1237 01/19/16 1312  AST 20 24  ALT 15 16  ALKPHOS 91 78  BILITOT 0.4 0.3  PROT 8.4* 7.9  ALBUMIN 3.6 3.3*    Recent Labs Lab 01/19/16 1312  LIPASE 11   No results for input(s): AMMONIA in the last 168 hours. Coagulation Profile: No results for input(s): INR, PROTIME in the last 168 hours. Cardiac Enzymes: No results for input(s): CKTOTAL, CKMB, CKMBINDEX, TROPONINI in the last 168 hours. BNP (last 3 results) No results for input(s): PROBNP in the last 8760 hours. HbA1C: No results for input(s): HGBA1C in the last 72  hours. CBG:  Recent Labs Lab 01/21/16 1950  GLUCAP 104*   Lipid Profile: No results for input(s): CHOL, HDL, LDLCALC, TRIG, CHOLHDL, LDLDIRECT in the last 72 hours. Thyroid Function Tests:  Recent Labs  01/19/16 1323  TSH 5.895*   Anemia Panel: No results for input(s): VITAMINB12, FOLATE, FERRITIN, TIBC, IRON, RETICCTPCT in the last 72 hours. Sepsis Labs:  Recent Labs Lab 01/19/16 1646 01/19/16 1854  LATICACIDVEN 0.8 2.0*    Recent Results (from the past 240 hour(s))  Urine culture     Status: Abnormal   Collection Time: 01/17/16 12:29 PM  Result Value Ref Range Status   Specimen Description URINE, CATHETERIZED  Final   Special Requests NONE  Final   Culture (A)  Final    >=100,000 COLONIES/mL ESCHERICHIA COLI Confirmed Extended Spectrum Beta-Lactamase Producer (ESBL) Performed at [facility]    Report Status 01/20/2016 FINAL  Final   Organism ID, Bacteria ESCHERICHIA COLI (A)  Final  Susceptibility   Escherichia coli - MIC*    AMPICILLIN >=32 RESISTANT Resistant     CEFAZOLIN >=64 RESISTANT Resistant     CEFTRIAXONE >=64 RESISTANT Resistant     CIPROFLOXACIN >=4 RESISTANT Resistant     GENTAMICIN >=16 RESISTANT Resistant     IMIPENEM <=0.25 SENSITIVE Sensitive     NITROFURANTOIN <=16 SENSITIVE Sensitive     TRIMETH/SULFA <=20 SENSITIVE Sensitive     AMPICILLIN/SULBACTAM >=32 RESISTANT Resistant     PIP/TAZO 8 SENSITIVE Sensitive     Extended ESBL POSITIVE Resistant     * >=100,000 COLONIES/mL ESCHERICHIA COLI  Urine culture     Status: None   Collection Time: 01/19/16  3:12 PM  Result Value Ref Range Status   Specimen Description URINE, CLEAN CATCH  Final   Special Requests NONE  Final   Culture NO GROWTH Performed at [facility]   Final   Report Status 01/21/2016 FINAL  Final  Culture, blood (routine x 2)     Status: None (Preliminary result)   Collection Time: 01/21/16  7:00 AM  Result Value Ref Range Status   Specimen  Description BLOOD LEFT ANTECUBITAL  Final   Special Requests BOTTLES DRAWN AEROBIC AND ANAEROBIC 6CC  Final   Culture NO GROWTH 1 DAY  Final   Report Status PENDING  Incomplete  Culture, blood (routine x 2)     Status: None (Preliminary result)   Collection Time: 01/21/16  8:40 AM  Result Value Ref Range Status   Specimen Description BLOOD PICC LINE  Final   Special Requests BOTTLES DRAWN AEROBIC AND ANAEROBIC 6CC  Final   Culture NO GROWTH < 24 HOURS  Final   Report Status PENDING  Incomplete         Radiology Studies: US Renal  Result Date: 01/20/2016 CLINICAL DATA:  Acute kidney injury. History of a left ureteral stent. EXAM: RENAL / URINARY TRACT ULTRASOUND COMPLETE COMPARISON:  CT, 12/11/2015 FINDINGS: Right Kidney: Limited visualization due to bowel gas and shadowing gallstones. Kidney does not appear enlarged. There is evidence of mild right hydronephrosis. Left Kidney: Length: 10.9 cm. Mild renal cortical thinning. Normal parenchymal echogenicity. 7 mm stone in the lower pole. No hydronephrosis. Bladder: Appears normal for degree of bladder distention. IMPRESSION: 1. Incompletely imaged right kidney. Evidence of mild right hydronephrosis. 2. Left intrarenal stone.  No left hydronephrosis. 3. Multiple gallstones. Electronically Signed   By: Lajean Manes M.D.   On: 01/20/2016 15:02   Dg Chest Port 1 View  Result Date: 01/20/2016 CLINICAL DATA:  PICC line placement EXAM: PORTABLE CHEST 1 VIEW COMPARISON:  January 19, 2016 FINDINGS: The right PICC line terminates below the brachiocephalic confluence in the SVC. Mild edema. Small amount of fluid in the right fissure. Stable cardiomegaly. No other change. IMPRESSION: The PICC line is in good position. There is mild pulmonary edema and probable fluid in the right fissure. Electronically Signed   By: Dorise Bullion III M.D   On: 01/20/2016 12:05   Ct Renal Stone Study  Result Date: 01/21/2016 CLINICAL DATA:  Hydronephrosis, Frequent  UTI'S. HX OF cerebral palsy, CKD III, GERD,HTN, kidney stones, gall stones EXAM: CT ABDOMEN AND PELVIS WITHOUT CONTRAST TECHNIQUE: Multidetector CT imaging of the abdomen and pelvis was performed following the standard protocol without IV contrast. COMPARISON:  Ultrasound, 01/20/2016.  CT, 12/11/2015. FINDINGS: Lower chest: No acute abnormality. Hepatobiliary: Liver is unremarkable. There multiple gallstones. No gallbladder wall thickening or inflammation. No bile duct dilation. Pancreas: Partial fatty replacement  of pancreas. No pancreatic mass or evidence of inflammation. Spleen: Normal in size without focal abnormality. Adrenals/Urinary Tract: No adrenal masses. Moderate right hydronephrosis and hydroureter. This is due to a 7 mm distal ureteral stone. No other right ureteral stones. No left ureteral stone or hydronephrosis. Small nonobstructing stone in the lower pole of the right kidney. There are multiple nonobstructing stones in the left kidney. There is left renal cortical thinning. The bladder is unremarkable. Stomach/Bowel: Stomach and small bowel unremarkable. Mild to moderate increased stool is noted throughout the colon and in the rectum. No colonic wall thickening or inflammation. No evidence of bowel obstruction. Normal appendix visualized. Vascular/Lymphatic: Minimal aortic atherosclerotic calcifications. No adenopathy. Reproductive: Uterus and bilateral adnexa are unremarkable. Other: No abdominal wall hernia or abnormality. No abdominopelvic ascites. Musculoskeletal: No fracture or acute finding. No osteoblastic or osteolytic lesions. IMPRESSION: 1. 7 mm stone in the distal right ureter causes moderate right hydronephrosis. 2. No other acute abnormality. 3. Bilateral nonobstructing intrarenal stones. Chronic left renal cortical thinning/scarring. 4. Gallstones.  No acute cholecystitis. 5. Mild to moderate increased stool noted throughout the colon and in the rectum. Electronically Signed   By:  Lajean Manes M.D.   On: 01/21/2016 16:21        Scheduled Meds: . ALPRAZolam  1 mg Oral BID  . aspirin EC  81 mg Oral q morning - 10a  . busPIRone  10 mg Oral q morning - 10a  . escitalopram  20 mg Oral QHS  . heparin  5,000 Units Subcutaneous Q8H  . levothyroxine  50 mcg Oral QAC breakfast  . mouth rinse  15 mL Mouth Rinse BID  . meropenem (MERREM) IV  500 mg Intravenous Q12H  . OLANZapine  20 mg Oral Daily  . oxybutynin  10 mg Oral q morning - 10a  . pantoprazole  40 mg Oral Daily  . senna-docusate  1 tablet Oral BID   Continuous Infusions: . sodium chloride 0.45 % 1,000 mL with sodium bicarbonate 50 mEq infusion 100 mL/hr at 01/22/16 0655     LOS: 3 days    Time spent: 58mins    Irine Seal, MD [facility] Pager [phone]  If 7PM-7AM, please contact night-coverage [email or web address] Password [facility] 01/22/2016, 10:51 AM

## 2016-01-22 NOTE — Anesthesia Procedure Notes (Signed)
Procedure Name: Intubation Date/Time: 01/22/2016 1:14 AM Performed by: Belinda Block Pre-anesthesia Checklist: Patient identified, Emergency Drugs available, Suction available, Patient being monitored and Timeout performed Patient Re-evaluated:Patient Re-evaluated prior to inductionOxygen Delivery Method: Circle system utilized Preoxygenation: Pre-oxygenation with 100% oxygen Intubation Type: IV induction and Rapid sequence Laryngoscope Size: Mac and 3 Grade View: Grade I Tube type: Oral Tube size: 7.0 mm Number of attempts: 1 Airway Equipment and Method: Stylet Placement Confirmation: ETT inserted through vocal cords under direct vision,  breath sounds checked- equal and bilateral and positive ETCO2 Secured at: 21 cm Tube secured with: Tape Dental Injury: Teeth and Oropharynx as per pre-operative assessment

## 2016-01-22 NOTE — Transfer of Care (Signed)
Immediate Anesthesia Transfer of Care Note  Patient: Rachel Morgan  Procedure(s) Performed: Procedure(s): CYSTOSCOPY WITH STENT PLACEMENT (Right)  Patient Location: PACU  Anesthesia Type:General  Level of Consciousness: awake, oriented, pateint uncooperative, lethargic and responds to stimulation  Airway & Oxygen Therapy: Patient Spontanous Breathing and Patient connected to face mask oxygen  Post-op Assessment: Report given to RN, Post -op Vital signs reviewed and stable and Patient moving all extremities  Post vital signs: Reviewed and stable  Last Vitals:  Vitals:   01/21/16 2213 01/22/16 0150  BP: 131/63 (!) (P) 121/46  Pulse: 62 (P) 70  Resp: 18 (!) (P) 28  Temp: 36.7 C (P) 36.6 C    Last Pain:  Vitals:   01/21/16 2338  TempSrc:   PainSc: 2       Patients Stated Pain Goal: 0 (97/35/32 9924)  Complications: No apparent anesthesia complications

## 2016-01-22 NOTE — Anesthesia Postprocedure Evaluation (Signed)
Anesthesia Post Note  Patient: Rachel Morgan  Procedure(s) Performed: Procedure(s) (LRB): CYSTOSCOPY WITH STENT PLACEMENT (Right)  Patient location during evaluation: PACU Anesthesia Type: General Level of consciousness: awake Pain management: pain level controlled Vital Signs Assessment: post-procedure vital signs reviewed and stable Respiratory status: spontaneous breathing Cardiovascular status: stable Anesthetic complications: no       Last Vitals:  Vitals:   01/22/16 0249 01/22/16 0530  BP: (!) 103/48 (!) 113/43  Pulse: 64 (!) 54  Resp: (!) 28 (!) 24  Temp:  36.9 C    Last Pain:  Vitals:   01/22/16 0530  TempSrc: Oral  PainSc:                  [name]

## 2016-01-22 NOTE — Op Note (Signed)
Preoperative diagnosis: Right distal ureteral stone, 7 millimeters with associated UTI  Postoperative diagnosis: Same  Principal procedure: Cystoscopy, right retrograde ureteropyelogram with fluoroscopic interpretation, right double-J stent placement-6 French by 24 centimeter without tether  Surgeon: Huzaifa Viney  Anesthesia: Gen.  Complications: None  Drains: 14 French Foley catheter, 24 centimeter by 6 French contour stent  Indications: 66 year old female with recurrent urolithiasis, history of multiple medical problems including cerebral palsy.  She recently presented with acute kidney failure, and today, on evaluation, was found have a 7 millimeter right ureteral stone with hydronephrosis.  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  She was transferred here from [facility] for further management.  Description of procedure: The patient was properly identified and marked in the holding area.  She has been on antibiotics.  She was taken to the operating room where general anesthetic was administered.  She is placed in the dorsolithotomy position.  Genitalia and perineum were prepped and draped.  Proper timeout was performed.  A 21 French panendoscope was advanced into her bladder.  Bladder was inspected.  No urothelial lesions were seen.  The right ureteral orifice was cannulated with a 6 [place] open-ended catheter.  Retrograde ureteropyelogram was performed with  Omnipaque.  This revealed a normal distal ureter up to the previously mentioned 7 millimeters stone which revealed a filling defect and proximal hydroureteronephrosis. .  Following adequate localization of the stone, I navigated a sensor-tip guidewire through the open-ended catheter, passed the stone.  This was curled in the upper pole calyceal system.  The open-ended catheter was removed.  I then passed a 6 [place] by 24 centimeter contour double-J stent over top of the guidewire.  Once adequate positioning was felt, the guidewire was  removed and the stent was deployed in the ureter, with excellent proximal and distal curls.  At this point, the bladder was drained.  A 16 French Foley catheter was then placed and hooked to a bedside bag.  The patient was then awakened and taken to the PACU in stable condition.  She tolerated the procedure well.

## 2016-01-23 LAB — RENAL FUNCTION PANEL
Albumin: 2 g/dL — ABNORMAL LOW (ref 3.5–5.0)
Anion gap: 8 (ref 5–15)
BUN: 43 mg/dL — ABNORMAL HIGH (ref 6–20)
CHLORIDE: 107 mmol/L (ref 101–111)
CO2: 25 mmol/L (ref 22–32)
CREATININE: 2.99 mg/dL — AB (ref 0.44–1.00)
Calcium: 7.2 mg/dL — ABNORMAL LOW (ref 8.9–10.3)
GFR, EST AFRICAN AMERICAN: 18 mL/min — AB (ref >60)
GFR, EST NON AFRICAN AMERICAN: 16 mL/min — AB (ref >60)
Glucose, Bld: 72 mg/dL (ref 65–99)
Phosphorus: 5 mg/dL — ABNORMAL HIGH (ref 2.5–4.6)
Potassium: 4 mmol/L (ref 3.5–5.1)
Sodium: 140 mmol/L (ref 135–145)

## 2016-01-23 LAB — CBC
HCT: 25.1 % — ABNORMAL LOW (ref 36.0–46.0)
Hemoglobin: 8.1 g/dL — ABNORMAL LOW (ref 12.0–15.0)
MCH: 28.5 pg (ref 26.0–34.0)
MCHC: 32.3 g/dL (ref 30.0–36.0)
MCV: 88.4 fL (ref 78.0–100.0)
PLATELETS: 175 10*3/uL (ref 150–400)
RBC: 2.84 MIL/uL — AB (ref 3.87–5.11)
RDW: 15.2 % (ref 11.5–15.5)
WBC: 6.7 10*3/uL (ref 4.0–10.5)

## 2016-01-23 MED ORDER — CYANOCOBALAMIN 1000 MCG/ML IJ SOLN
1000.0000 ug | Freq: Every day | INTRAMUSCULAR | Status: DC
  Administered 2016-01-23 – 2016-01-26 (×4): 1000 ug via INTRAMUSCULAR
  Filled 2016-01-23 (×4): qty 1

## 2016-01-23 MED ORDER — POLYSACCHARIDE IRON COMPLEX 150 MG PO CAPS
150.0000 mg | ORAL_CAPSULE | Freq: Every day | ORAL | Status: DC
  Administered 2016-01-24 – 2016-01-26 (×3): 150 mg via ORAL
  Filled 2016-01-23 (×3): qty 1

## 2016-01-23 MED ORDER — SODIUM CHLORIDE 0.9 % IV SOLN
510.0000 mg | Freq: Once | INTRAVENOUS | Status: AC
  Administered 2016-01-23: 510 mg via INTRAVENOUS
  Filled 2016-01-23: qty 17

## 2016-01-23 NOTE — Progress Notes (Signed)
PROGRESS NOTE    Rachel Morgan  FTD:[id number] DOB: [birth year] DOA: 01/19/2016 PCP: [facility]    Brief Narrative:  67 year old female with a history of cerebral palsy, recurrent UTIs, CKD stage III, history of kidney stone s/p left urteral stent by Dr. Jeffie Pollock in 10/2015, presented with decreased urine output/dark urine. She was found to have a urinary tract infection with Escherichia coli and worsening renal failure with a creatinine of 4.5. She was started on IV fluids and IV antibiotics. Nephrology following patient and changed fluid to bicarbonate infusion. She began developing fevers and CT abd revealed 3mm stone moderate right hydronephrosis. Case discussed with urology who felt that she would need a ureteral stent and requested patient to be transferred to [facility].   Assessment & Plan:   Principal Problem:   AKI (acute kidney injury) ([place]) Active Problems:   Dehydration   Hypothyroidism   Hypertension   CKD (chronic kidney disease), stage III   Urinary tract infectious disease   Cerebral palsy (HCC)   Hydronephrosis with renal and ureteral calculus obstruction   Hyperkalemia   PICC (peripherally inserted central catheter) in place   Urinary tract infection without hematuria   1. AKI on CKD 3. Patient's baseline creatinine is 1.3-1.7. She presented with a creatinine of 4.5. Etiologies included dehydration/NSAID use/recent Bactrim as well as post obstructive uropathy with hydronephrosis. Nephrology was following at [facility]. Patient currently on IV fluids with bicarbonate. Creatinine  failed to show significant improvement, patient developed fevers. Abd CT shows right sided renal stone with hydronephrosis. Case discussed with Dr. Alyson Ingles with urology who felt that she would likely need a ureteral stent and requested transfer to [facility]. Patient was transferred to [facility] and underwent cystoscopy with right double J stent placement 01/22/2016. Renal  function trending down patient with good urinary output . Follow.  2. ESBL E Coli Urinary tract infection. Recent culture drawn on 1/10 positive for Escherichia coli, ESBL. Continue Meropenem.  3. Hyperkalemia. Likely related to renal failure/bactrim and spironolactone. On IV fluids with bicarbonate. EKG with no peaked T waves. Monitor on telemetry. She has received kayexalate with resolution of hyperkalemia. Diet also changed to renal diet.  Since she had significant stool in rectum and had not had a bowel movement, patient was given an enema with good results.   4. Hypothyroidism. Continue synthroid.  5. Cerebral palsy.  6. Anemia, suspect this is related to chronic renal disease and iron deficiency anemia. No signs of bleeding. Will give a dose of IV Feraheme and place on oral iron supplementation. Continue to follow.  7. Metabolic acidosis. Lactic acid normal. Improving with bicarbonate drip. Suspect this is related to renal disease.      DVT prophylaxis: heparin Code Status: full code Family Communication: discussed with sister at bedside Disposition Plan: Home once renal function back to baseline, resolution of hyperkalemia and per urology.   Consultants:   Nephrology: Dr. Theador Hawthorne 01/20/2016  Urology/; Dr Pilar Jarvis 01/21/2016  Procedures:   PICC line 1/13>> Cystoscopy, right retrograde ureteropyelogram with fluoroscopic interpretation, right double-J stent placement-6 French by 24 centimeter without tether--Dr. Diona Fanti 01/22/2015  Antimicrobials:   Meropenem 1/12>>   Subjective: Patient alert, denies SOB, no CP. Per sister patient improving with good UOP.  Objective: Vitals:   01/22/16 0530 01/22/16 1357 01/22/16 2136 01/23/16 0529  BP: (!) 113/43 (!) 112/50 (!) 110/49 (!) 110/51  Pulse: (!) 54 (!) 55 66 62  Resp: (!) 24 (!) 22 (!) 22 20  Temp: 98.4 F (36.9 C) 99.6 F (37.6 C) 97.8 F (36.6 C) 97.4 F (36.3 C)  TempSrc: Oral Oral Oral Oral  SpO2: 98% 96% 97%  99%  Weight:      Height:        Intake/Output Summary (Last 24 hours) at 01/23/16 1112 Last data filed at 01/23/16 0900  Gross per 24 hour  Intake             2320 ml  Output             6476 ml  Net            -4156 ml   Filed Weights   01/19/16 1207 01/19/16 2059  Weight: 124.7 kg (275 lb) 117.1 kg (258 lb 1.6 oz)    Examination:  General exam: NAD Respiratory system: Clear to auscultation anterior lung fields. Respiratory effort normal. Cardiovascular system: S1 & S2 heard, RRR. No JVD, murmurs, rubs, gallops or clicks. No pedal edema. Gastrointestinal system: Abdomen is nondistended, soft and nontender. No organomegaly or masses felt. Normal bowel sounds heard. Central nervous system: Alert. No focal neurological deficits. Extremities: Symmetric 5 x 5 power. Skin: No rashes, lesions or ulcers Psychiatry: unable to communicate due to MR     Data Reviewed: I have personally reviewed following labs and imaging studies  CBC:  Recent Labs Lab 01/17/16 1237 01/19/16 1312 01/21/16 0650 01/22/16 1031 01/23/16 0449  WBC 14.0* 15.4* 6.6 6.8 6.7  NEUTROABS 12.8*  --   --  4.2  --   HGB 11.1* 11.1* 9.6* 8.8* 8.1*  HCT 35.5* 34.7* 29.1* 27.1* 25.1*  MCV 91.5 91.3 89.5 88.0 88.4  PLT 273 210 219 182 268   Basic Metabolic Panel:  Recent Labs Lab 01/20/16 0659 01/21/16 0650 01/21/16 1928 01/22/16 1031 01/23/16 0456  NA 136 133* 134* 138 140  K 5.6* 6.1* 5.9* 5.9* 4.0  CL 111 107 105 110 107  CO2 17* 19* 21* 22 25  GLUCOSE 94 108* 107* 84 72  BUN 58* 53* 53* 53* 43*  CREATININE 4.55* 4.53* 4.50* 3.98* 2.99*  CALCIUM 8.1* 7.8* 8.2* 7.9* 7.2*  PHOS  --   --   --  4.4 5.0*   GFR: Estimated Creatinine Clearance: 22.5 mL/min (by C-G formula based on SCr of 2.99 mg/dL (H)). Liver Function Tests:  Recent Labs Lab 01/17/16 1237 01/19/16 1312 01/22/16 1031 01/23/16 0456  AST 20 24  --   --   ALT 15 16  --   --   ALKPHOS 91 78  --   --   BILITOT 0.4 0.3  --    --   PROT 8.4* 7.9  --   --   ALBUMIN 3.6 3.3* 2.2* 2.0*    Recent Labs Lab 01/19/16 1312  LIPASE 11   No results for input(s): AMMONIA in the last 168 hours. Coagulation Profile: No results for input(s): INR, PROTIME in the last 168 hours. Cardiac Enzymes: No results for input(s): CKTOTAL, CKMB, CKMBINDEX, TROPONINI in the last 168 hours. BNP (last 3 results) No results for input(s): PROBNP in the last 8760 hours. HbA1C: No results for input(s): HGBA1C in the last 72 hours. CBG:  Recent Labs Lab 01/21/16 1950  GLUCAP 104*   Lipid Profile: No results for input(s): CHOL, HDL, LDLCALC, TRIG, CHOLHDL, LDLDIRECT in the last 72 hours. Thyroid Function Tests: No results for input(s): TSH, T4TOTAL, FREET4, T3FREE, THYROIDAB in the last 72 hours. Anemia Panel:  Recent Labs  01/22/16 1031  VITAMINB12 270  FOLATE 19.3  FERRITIN 1,131*  TIBC 150*  IRON 12*  RETICCTPCT 1.3   Sepsis Labs:  Recent Labs Lab 01/19/16 1646 01/19/16 1854 01/22/16 1031  LATICACIDVEN 0.8 2.0* 0.6    Recent Results (from the past 240 hour(s))  Urine culture     Status: Abnormal   Collection Time: 01/17/16 12:29 PM  Result Value Ref Range Status   Specimen Description URINE, CATHETERIZED  Final   Special Requests NONE  Final   Culture (A)  Final    >=100,000 COLONIES/mL ESCHERICHIA COLI Confirmed Extended Spectrum Beta-Lactamase Producer (ESBL) Performed at [facility]    Report Status 01/20/2016 FINAL  Final   Organism ID, Bacteria ESCHERICHIA COLI (A)  Final      Susceptibility   Escherichia coli - MIC*    AMPICILLIN >=32 RESISTANT Resistant     CEFAZOLIN >=64 RESISTANT Resistant     CEFTRIAXONE >=64 RESISTANT Resistant     CIPROFLOXACIN >=4 RESISTANT Resistant     GENTAMICIN >=16 RESISTANT Resistant     IMIPENEM <=0.25 SENSITIVE Sensitive     NITROFURANTOIN <=16 SENSITIVE Sensitive     TRIMETH/SULFA <=20 SENSITIVE Sensitive     AMPICILLIN/SULBACTAM >=32 RESISTANT  Resistant     PIP/TAZO 8 SENSITIVE Sensitive     Extended ESBL POSITIVE Resistant     * >=100,000 COLONIES/mL ESCHERICHIA COLI  Urine culture     Status: None   Collection Time: 01/19/16  3:12 PM  Result Value Ref Range Status   Specimen Description URINE, CLEAN CATCH  Final   Special Requests NONE  Final   Culture NO GROWTH Performed at [facility]   Final   Report Status 01/21/2016 FINAL  Final  Culture, blood (routine x 2)     Status: None (Preliminary result)   Collection Time: 01/21/16  7:00 AM  Result Value Ref Range Status   Specimen Description BLOOD LEFT ANTECUBITAL  Final   Special Requests BOTTLES DRAWN AEROBIC AND ANAEROBIC 6CC  Final   Culture NO GROWTH 2 DAYS  Final   Report Status PENDING  Incomplete  Culture, blood (routine x 2)     Status: None (Preliminary result)   Collection Time: 01/21/16  8:40 AM  Result Value Ref Range Status   Specimen Description BLOOD PICC LINE  Final   Special Requests BOTTLES DRAWN AEROBIC AND ANAEROBIC 6CC  Final   Culture NO GROWTH 2 DAYS  Final   Report Status PENDING  Incomplete         Radiology Studies: Ct Renal Stone Study  Result Date: 01/21/2016 CLINICAL DATA:  Hydronephrosis, Frequent UTI'S. HX OF cerebral palsy, CKD III, GERD,HTN, kidney stones, gall stones EXAM: CT ABDOMEN AND PELVIS WITHOUT CONTRAST TECHNIQUE: Multidetector CT imaging of the abdomen and pelvis was performed following the standard protocol without IV contrast. COMPARISON:  Ultrasound, 01/20/2016.  CT, 12/11/2015. FINDINGS: Lower chest: No acute abnormality. Hepatobiliary: Liver is unremarkable. There multiple gallstones. No gallbladder wall thickening or inflammation. No bile duct dilation. Pancreas: Partial fatty replacement of pancreas. No pancreatic mass or evidence of inflammation. Spleen: Normal in size without focal abnormality. Adrenals/Urinary Tract: No adrenal masses. Moderate right hydronephrosis and hydroureter. This is due to a 7 mm  distal ureteral stone. No other right ureteral stones. No left ureteral stone or hydronephrosis. Small nonobstructing stone in the lower pole of the right kidney. There are multiple nonobstructing stones in the left kidney. There is left renal cortical thinning. The bladder is unremarkable. Stomach/Bowel: Stomach and  small bowel unremarkable. Mild to moderate increased stool is noted throughout the colon and in the rectum. No colonic wall thickening or inflammation. No evidence of bowel obstruction. Normal appendix visualized. Vascular/Lymphatic: Minimal aortic atherosclerotic calcifications. No adenopathy. Reproductive: Uterus and bilateral adnexa are unremarkable. Other: No abdominal wall hernia or abnormality. No abdominopelvic ascites. Musculoskeletal: No fracture or acute finding. No osteoblastic or osteolytic lesions. IMPRESSION: 1. 7 mm stone in the distal right ureter causes moderate right hydronephrosis. 2. No other acute abnormality. 3. Bilateral nonobstructing intrarenal stones. Chronic left renal cortical thinning/scarring. 4. Gallstones.  No acute cholecystitis. 5. Mild to moderate increased stool noted throughout the colon and in the rectum. Electronically Signed   By: Lajean Manes M.D.   On: 01/21/2016 16:21        Scheduled Meds: . ALPRAZolam  1 mg Oral BID  . aspirin EC  81 mg Oral q morning - 10a  . busPIRone  10 mg Oral q morning - 10a  . cyanocobalamin  1,000 mcg Intramuscular Daily  . escitalopram  20 mg Oral QHS  . heparin  5,000 Units Subcutaneous Q8H  . levothyroxine  50 mcg Oral QAC breakfast  . mouth rinse  15 mL Mouth Rinse BID  . meropenem (MERREM) IV  500 mg Intravenous Q12H  . OLANZapine  20 mg Oral Daily  . oxybutynin  10 mg Oral q morning - 10a  . pantoprazole  40 mg Oral Daily  . senna-docusate  1 tablet Oral BID   Continuous Infusions: . sodium chloride 0.45 % 1,000 mL with sodium bicarbonate 50 mEq infusion 100 mL/hr at 01/23/16 0900     LOS: 4 days     Time spent: 36 mins    Leno Mathes, MD [facility] Pager [phone]  If 7PM-7AM, please contact night-coverage [email or web address] Password [email or web address] 01/23/2016, 11:12 AM

## 2016-01-23 NOTE — Care Management Important Message (Addendum)
Important Message  Patient Details IM Letter given to Cookie/Case Manager to present to Patient. Name: Rachel Morgan MRN: [id number] Date of Birth: 03-30-1950   Medicare Important Message Given:  Yes    Kerin Salen 01/23/2016, 11:25 AMImportant Message  Patient Details  Name: Rachel Morgan MRN: [id number] Date of Birth: 24-Jul-1950   Medicare Important Message Given:  Yes    Kerin Salen 01/23/2016, 11:24 AM

## 2016-01-23 NOTE — Progress Notes (Signed)
No events since surgery Cr and K improving Good uop Doing better per sister  Vitals:   01/22/16 2136 01/23/16 0529 01/23/16 0900 01/23/16 1305  BP: (!) 110/49 (!) 110/51 115/78 (!) 128/55  Pulse: 66 62 71 64  Resp: (!) 22 20  20   Temp: 97.8 F (36.6 C) 97.4 F (36.3 C) 97.6 F (36.4 C) 97.5 F (36.4 C)  TempSrc: Oral Oral Oral Oral  SpO2: 97% 99% 98% 94%  Weight:   258 lb 1.6 oz (117.1 kg)   Height:   5' (1.524 m)    I/O last 3 completed shifts: In: 9449 [P.O.:120; I.V.:4210; Other:200; IV Piggyback:200] Out: 6759 [Urine:4375] Total I/O In: 540 [P.O.:240; I.V.:300] Out: 2901 [Urine:2900; Stool:1]   NAD Soft NT ND Foley clear yellow  CBC    Component Value Date/Time   WBC 6.7 01/23/2016 0449   RBC 2.84 (L) 01/23/2016 0449   HGB 8.1 (L) 01/23/2016 0449   HCT 25.1 (L) 01/23/2016 0449   PLT 175 01/23/2016 0449   MCV 88.4 01/23/2016 0449   MCH 28.5 01/23/2016 0449   MCHC 32.3 01/23/2016 0449   RDW 15.2 01/23/2016 0449   LYMPHSABS 1.1 01/22/2016 1031   MONOABS 1.2 (H) 01/22/2016 1031   EOSABS 0.3 01/22/2016 1031   BASOSABS 0.0 01/22/2016 1031   BMP Latest Ref Rng & Units 01/23/2016 01/22/2016 01/21/2016  Glucose 65 - 99 mg/dL 72 84 107(H)  BUN 6 - 20 mg/dL 43(H) 53(H) 53(H)  Creatinine 0.44 - 1.00 mg/dL 2.99(H) 3.98(H) 4.50(H)  Sodium 135 - 145 mmol/L 140 138 134(L)  Potassium 3.5 - 5.1 mmol/L 4.0 5.9(H) 5.9(H)  Chloride 101 - 111 mmol/L 107 110 105  CO2 22 - 32 mmol/L 25 22 21(L)  Calcium 8.9 - 10.3 mg/dL 7.2(L) 7.9(L) 8.2(L)   POD 1 cysto, right stent for sepsis, UTI, and AKI. Doing better -d/c foley -continue full course of abx -no further urologic intervention during this admission -follow up with [facility] to discuss definitive stone managment

## 2016-01-24 DIAGNOSIS — N179 Acute kidney failure, unspecified: Secondary | ICD-10-CM

## 2016-01-24 DIAGNOSIS — N132 Hydronephrosis with renal and ureteral calculous obstruction: Secondary | ICD-10-CM

## 2016-01-24 LAB — RENAL FUNCTION PANEL
Albumin: 2.1 g/dL — ABNORMAL LOW (ref 3.5–5.0)
Anion gap: 5 (ref 5–15)
BUN: 31 mg/dL — AB (ref 6–20)
CALCIUM: 7.6 mg/dL — AB (ref 8.9–10.3)
CHLORIDE: 107 mmol/L (ref 101–111)
CO2: 29 mmol/L (ref 22–32)
CREATININE: 2.2 mg/dL — AB (ref 0.44–1.00)
GFR calc Af Amer: 26 mL/min — ABNORMAL LOW (ref >60)
GFR, EST NON AFRICAN AMERICAN: 23 mL/min — AB (ref >60)
Glucose, Bld: 85 mg/dL (ref 65–99)
Phosphorus: 4 mg/dL (ref 2.5–4.6)
Potassium: 3.8 mmol/L (ref 3.5–5.1)
SODIUM: 141 mmol/L (ref 135–145)

## 2016-01-24 LAB — CBC
HCT: 26.1 % — ABNORMAL LOW (ref 36.0–46.0)
Hemoglobin: 8.6 g/dL — ABNORMAL LOW (ref 12.0–15.0)
MCH: 29.3 pg (ref 26.0–34.0)
MCHC: 33 g/dL (ref 30.0–36.0)
MCV: 88.8 fL (ref 78.0–100.0)
Platelets: 195 10*3/uL (ref 150–400)
RBC: 2.94 MIL/uL — AB (ref 3.87–5.11)
RDW: 15.2 % (ref 11.5–15.5)
WBC: 5.4 10*3/uL (ref 4.0–10.5)

## 2016-01-24 MED ORDER — SODIUM CHLORIDE 0.9 % IV SOLN
1.0000 g | Freq: Two times a day (BID) | INTRAVENOUS | Status: AC
  Administered 2016-01-24 – 2016-01-26 (×5): 1 g via INTRAVENOUS
  Filled 2016-01-24 (×5): qty 1

## 2016-01-24 NOTE — Progress Notes (Signed)
Pharmacy Antibiotic Note  Rachel Morgan is a 66 y.o. female admitted on 01/19/2016 with Ecoli ESBL UTI.   Pharmacy has been consulted for Merrem dosing. Patient is currently on day#5 Merrem.  01/24/2016:  Afebrile  Renal fxn improving, Scr 2.2 (est CrCl ~61ml/min)  Plan: Increase Merrem 1000mg  IV q12h Monitor renal function and cx data  When ready for discharge, consider Fosfomycin 3gm PO q72h to complete 10-14 day course  Height: 5' (152.4 cm) Weight: 258 lb 1.6 oz (117.1 kg) IBW/kg (Calculated) : 45.5  Temp (24hrs), Avg:97.7 F (36.5 C), Min:97.5 F (36.4 C), Max:98.1 F (36.7 C)   Recent Labs Lab 01/19/16 1312 01/19/16 1646 01/19/16 1854  01/21/16 0650 01/21/16 1928 01/22/16 1031 01/23/16 0449 01/23/16 0456 01/24/16 0500  WBC 15.4*  --   --   --  6.6  --  6.8 6.7  --  5.4  CREATININE 4.49*  --   --   < > 4.53* 4.50* 3.98*  --  2.99* 2.20*  LATICACIDVEN  --  0.8 2.0*  --   --   --  0.6  --   --   --   < > = values in this interval not displayed.  Estimated Creatinine Clearance: 30.6 mL/min (by C-G formula based on SCr of 2.2 mg/dL (H)).    Allergies  Allergen Reactions  . Macrobid [Nitrofurantoin Monohyd Macro] Hives and Swelling  . Penicillins Swelling and Rash    Has patient had a PCN reaction causing immediate rash, facial/tongue/throat swelling, SOB or lightheadedness with hypotension: Yes Has patient had a PCN reaction causing severe rash involving mucus membranes or skin necrosis: Yes Has patient had a PCN reaction that required hospitalization: no Has patient had a PCN reaction occurring within the last 10 years: no If all of the above answers are "NO", then may proceed with Cephalosporin use. Patient tolerated rocephin and ertapenem in the past    Antimicrobials this admission: Merrem 1/12 >>   Dose adjustments this admission: 1/14: Dec to 500mg  IV q12h 1/17: Inc Merrem to 1gm Iv q12 for improving renal fxn   Microbiology results: 1/10 ucx:  >100K ESBL Ecoli (S=imi, nitro, zosyn, bactrim)  1/12 UCx: neg FINAL 1/14 BCx: x2 NGTD   Thank you for allowing pharmacy to be a part of this patient's care.  Netta Cedars, PharmD, BCPS Pager: [phone] 01/24/2016 10:45 AM

## 2016-01-24 NOTE — Progress Notes (Signed)
PROGRESS NOTE    Rachel Morgan  JEH:[id number] DOB: April 20, 1950 DOA: 01/19/2016 PCP: [facility]    Brief Narrative:  66 year old female with a history of cerebral palsy, recurrent UTIs, CKD stage III, history of kidney stone s/p left urteral stent by Dr. Jeffie Pollock in 10/2015, presented with decreased urine output/dark urine. She was found to have a urinary tract infection with Escherichia coli and worsening renal failure with a creatinine of 4.5. She was started on IV fluids and IV antibiotics. Nephrology following patient and changed fluid to bicarbonate infusion. She began developing fevers and CT abd revealed 68mm stone moderate right hydronephrosis. Case discussed with urology who felt that she would need a ureteral stent and requested patient to be transferred to [facility].  Assessment & Plan:   Principal Problem:   AKI (acute kidney injury) ([place]) Active Problems:   Dehydration   Hypothyroidism   Hypertension   CKD (chronic kidney disease), stage III   Urinary tract infectious disease   Cerebral palsy (HCC)   Hydronephrosis with renal and ureteral calculus obstruction   Hyperkalemia   PICC (peripherally inserted central catheter) in place   Urinary tract infection without hematuria   1. AKI on CKD 3.  -Patient's baseline creatinine is 1.3-1.7. She presented with a creatinine of 4.5.  -Etiologies included dehydration/NSAID use/recent Bactrim as well as post obstructive uropathy with hydronephrosis. -Nephrology was following at [facility].  -Patient currently on IV fluids with bicarbonate. -Abd CT shows right sided renal stone with hydronephrosis.  -Ptient was transferred to [facility] and underwent cystoscopy with right double J stent placement 01/22/2016. -Renal function trending down patient with good urinary output Cr is now down to 2.20.  2. ESBL E Coli Urinary tract infection.  -Recent culture drawn on 1/10 positive for Escherichia coli, ESBL.    -Continue Meropenem.  3. Hyperkalemia. -Resolved.  4. Hypothyroidism.  -Continue synthroid.  5. Cerebral palsy. -Stable and unchanged.  6. Anemia -Suspect this is related to chronic renal disease and iron deficiency anemia. No signs of bleeding.  -Will give a dose of IV Feraheme and place on oral iron supplementation.  -Continue to follow.  7. Metabolic acidosis. Lactic acid normal. Improving with bicarbonate drip. Bicarbonate is 29 so will discontine Bicarb gtt.      DVT prophylaxis: heparin Code Status: full code Family Communication: discussed with sister at bedside Disposition Plan: Home in AM  Consultants:   Nephrology: Dr. Theador Hawthorne 01/20/2016  Urology/; Dr Pilar Jarvis 01/21/2016  Procedures:   PICC line 1/13>> Cystoscopy, right retrograde ureteropyelogram with fluoroscopic interpretation, right double-J stent placement-6 French by 24 centimeter without tether--Dr. Diona Fanti 01/22/2015  Antimicrobials:   Meropenem 1/12>>   Subjective: Patient alert, denies SOB, no CP. Per sister patient improving with good UOP.  Objective: Vitals:   01/23/16 1305 01/23/16 2125 01/24/16 0613 01/24/16 1400  BP: (!) 128/55 (!) 121/52 (!) 108/49 109/64  Pulse: 64 (!) 56 (!) 55 (!) 56  Resp: 20 20 20 18   Temp: 97.5 F (36.4 C) 98.1 F (36.7 C) 97.6 F (36.4 C) 98.4 F (36.9 C)  TempSrc: Oral Oral Oral Oral  SpO2: 94% 94% 96% 99%  Weight:      Height:        Intake/Output Summary (Last 24 hours) at 01/24/16 1622 Last data filed at 01/24/16 0940  Gross per 24 hour  Intake             2060 ml  Output  0 ml  Net             2060 ml   Filed Weights   01/19/16 1207 01/19/16 2059 01/23/16 0900  Weight: 124.7 kg (275 lb) 117.1 kg (258 lb 1.6 oz) 117.1 kg (258 lb 1.6 oz)    Examination:  General exam: NAD Respiratory system: Clear to auscultation anterior lung fields. Respiratory effort normal. Cardiovascular system: S1 & S2 heard, RRR. No JVD, murmurs,  rubs, gallops or clicks. No pedal edema. Gastrointestinal system: Abdomen is nondistended, soft and nontender. No organomegaly or masses felt. Normal bowel sounds heard. Central nervous system: Alert. No focal neurological deficits. Extremities: Symmetric 5 x 5 power. Skin: No rashes, lesions or ulcers Psychiatry: unable to communicate due to MR     Data Reviewed: I have personally reviewed following labs and imaging studies  CBC:  Recent Labs Lab 01/19/16 1312 01/21/16 0650 01/22/16 1031 01/23/16 0449 01/24/16 0500  WBC 15.4* 6.6 6.8 6.7 5.4  NEUTROABS  --   --  4.2  --   --   HGB 11.1* 9.6* 8.8* 8.1* 8.6*  HCT 34.7* 29.1* 27.1* 25.1* 26.1*  MCV 91.3 89.5 88.0 88.4 88.8  PLT 210 219 182 175 557   Basic Metabolic Panel:  Recent Labs Lab 01/21/16 0650 01/21/16 1928 01/22/16 1031 01/23/16 0456 01/24/16 0500  NA 133* 134* 138 140 141  K 6.1* 5.9* 5.9* 4.0 3.8  CL 107 105 110 107 107  CO2 19* 21* 22 25 29   GLUCOSE 108* 107* 84 72 85  BUN 53* 53* 53* 43* 31*  CREATININE 4.53* 4.50* 3.98* 2.99* 2.20*  CALCIUM 7.8* 8.2* 7.9* 7.2* 7.6*  PHOS  --   --  4.4 5.0* 4.0   GFR: Estimated Creatinine Clearance: 30.6 mL/min (by C-G formula based on SCr of 2.2 mg/dL (H)). Liver Function Tests:  Recent Labs Lab 01/19/16 1312 01/22/16 1031 01/23/16 0456 01/24/16 0500  AST 24  --   --   --   ALT 16  --   --   --   ALKPHOS 78  --   --   --   BILITOT 0.3  --   --   --   PROT 7.9  --   --   --   ALBUMIN 3.3* 2.2* 2.0* 2.1*    Recent Labs Lab 01/19/16 1312  LIPASE 11   No results for input(s): AMMONIA in the last 168 hours. Coagulation Profile: No results for input(s): INR, PROTIME in the last 168 hours. Cardiac Enzymes: No results for input(s): CKTOTAL, CKMB, CKMBINDEX, TROPONINI in the last 168 hours. BNP (last 3 results) No results for input(s): PROBNP in the last 8760 hours. HbA1C: No results for input(s): HGBA1C in the last 72 hours. CBG:  Recent Labs Lab  01/21/16 1950  GLUCAP 104*   Lipid Profile: No results for input(s): CHOL, HDL, LDLCALC, TRIG, CHOLHDL, LDLDIRECT in the last 72 hours. Thyroid Function Tests: No results for input(s): TSH, T4TOTAL, FREET4, T3FREE, THYROIDAB in the last 72 hours. Anemia Panel:  Recent Labs  01/22/16 1031  VITAMINB12 270  FOLATE 19.3  FERRITIN 1,131*  TIBC 150*  IRON 12*  RETICCTPCT 1.3   Sepsis Labs:  Recent Labs Lab 01/19/16 1646 01/19/16 1854 01/22/16 1031  LATICACIDVEN 0.8 2.0* 0.6    Recent Results (from the past 240 hour(s))  Urine culture     Status: Abnormal   Collection Time: 01/17/16 12:29 PM  Result Value Ref Range Status   Specimen Description URINE,  CATHETERIZED  Final   Special Requests NONE  Final   Culture (A)  Final    >=100,000 COLONIES/mL ESCHERICHIA COLI Confirmed Extended Spectrum Beta-Lactamase Producer (ESBL) Performed at [facility]    Report Status 01/20/2016 FINAL  Final   Organism ID, Bacteria ESCHERICHIA COLI (A)  Final      Susceptibility   Escherichia coli - MIC*    AMPICILLIN >=32 RESISTANT Resistant     CEFAZOLIN >=64 RESISTANT Resistant     CEFTRIAXONE >=64 RESISTANT Resistant     CIPROFLOXACIN >=4 RESISTANT Resistant     GENTAMICIN >=16 RESISTANT Resistant     IMIPENEM <=0.25 SENSITIVE Sensitive     NITROFURANTOIN <=16 SENSITIVE Sensitive     TRIMETH/SULFA <=20 SENSITIVE Sensitive     AMPICILLIN/SULBACTAM >=32 RESISTANT Resistant     PIP/TAZO 8 SENSITIVE Sensitive     Extended ESBL POSITIVE Resistant     * >=100,000 COLONIES/mL ESCHERICHIA COLI  Urine culture     Status: None   Collection Time: 01/19/16  3:12 PM  Result Value Ref Range Status   Specimen Description URINE, CLEAN CATCH  Final   Special Requests NONE  Final   Culture NO GROWTH Performed at [facility]   Final   Report Status 01/21/2016 FINAL  Final  Culture, blood (routine x 2)     Status: None (Preliminary result)   Collection Time: 01/21/16  7:00 AM    Result Value Ref Range Status   Specimen Description BLOOD LEFT ANTECUBITAL  Final   Special Requests BOTTLES DRAWN AEROBIC AND ANAEROBIC 6CC  Final   Culture NO GROWTH 3 DAYS  Final   Report Status PENDING  Incomplete  Culture, blood (routine x 2)     Status: None (Preliminary result)   Collection Time: 01/21/16  8:40 AM  Result Value Ref Range Status   Specimen Description BLOOD PICC LINE  Final   Special Requests BOTTLES DRAWN AEROBIC AND ANAEROBIC 6CC  Final   Culture NO GROWTH 3 DAYS  Final   Report Status PENDING  Incomplete         Radiology Studies: No results found.      Scheduled Meds: . ALPRAZolam  1 mg Oral BID  . aspirin EC  81 mg Oral q morning - 10a  . busPIRone  10 mg Oral q morning - 10a  . cyanocobalamin  1,000 mcg Intramuscular Daily  . escitalopram  20 mg Oral QHS  . heparin  5,000 Units Subcutaneous Q8H  . iron polysaccharides  150 mg Oral Daily  . levothyroxine  50 mcg Oral QAC breakfast  . mouth rinse  15 mL Mouth Rinse BID  . meropenem (MERREM) IV  1 g Intravenous Q12H  . OLANZapine  20 mg Oral Daily  . oxybutynin  10 mg Oral q morning - 10a  . pantoprazole  40 mg Oral Daily  . senna-docusate  1 tablet Oral BID   Continuous Infusions: . sodium chloride 0.45 % 1,000 mL with sodium bicarbonate 50 mEq infusion 100 mL/hr at 01/24/16 0536     LOS: 5 days       Kerney Elbe, MD [facility] Pager [phone] If 7PM-7AM, please contact night-coverage [email or web address] Password [email or web address] 01/24/2016, 4:22 PM

## 2016-01-25 DIAGNOSIS — E875 Hyperkalemia: Secondary | ICD-10-CM

## 2016-01-25 DIAGNOSIS — N183 Chronic kidney disease, stage 3 (moderate): Secondary | ICD-10-CM

## 2016-01-25 DIAGNOSIS — E039 Hypothyroidism, unspecified: Secondary | ICD-10-CM

## 2016-01-25 DIAGNOSIS — E860 Dehydration: Secondary | ICD-10-CM

## 2016-01-25 DIAGNOSIS — N390 Urinary tract infection, site not specified: Secondary | ICD-10-CM

## 2016-01-25 DIAGNOSIS — I10 Essential (primary) hypertension: Secondary | ICD-10-CM

## 2016-01-25 LAB — CBC WITH DIFFERENTIAL/PLATELET
BASOS ABS: 0 10*3/uL (ref 0.0–0.1)
BASOS PCT: 1 %
EOS ABS: 0 10*3/uL (ref 0.0–0.7)
Eosinophils Relative: 0 %
HCT: 29.5 % — ABNORMAL LOW (ref 36.0–46.0)
HEMOGLOBIN: 9.4 g/dL — AB (ref 12.0–15.0)
LYMPHS ABS: 1.6 10*3/uL (ref 0.7–4.0)
Lymphocytes Relative: 30 %
MCH: 28.6 pg (ref 26.0–34.0)
MCHC: 31.9 g/dL (ref 30.0–36.0)
MCV: 89.7 fL (ref 78.0–100.0)
Monocytes Absolute: 0.6 10*3/uL (ref 0.1–1.0)
Monocytes Relative: 11 %
NEUTROS PCT: 58 %
Neutro Abs: 3.1 10*3/uL (ref 1.7–7.7)
PLATELETS: 195 10*3/uL (ref 150–400)
RBC: 3.29 MIL/uL — ABNORMAL LOW (ref 3.87–5.11)
RDW: 14.9 % (ref 11.5–15.5)
WBC: 5.3 10*3/uL (ref 4.0–10.5)

## 2016-01-25 LAB — RENAL FUNCTION PANEL
ALBUMIN: 2.2 g/dL — AB (ref 3.5–5.0)
Anion gap: 7 (ref 5–15)
BUN: 24 mg/dL — ABNORMAL HIGH (ref 6–20)
CALCIUM: 7.7 mg/dL — AB (ref 8.9–10.3)
CHLORIDE: 106 mmol/L (ref 101–111)
CO2: 29 mmol/L (ref 22–32)
CREATININE: 1.7 mg/dL — AB (ref 0.44–1.00)
GFR, EST AFRICAN AMERICAN: 36 mL/min — AB (ref >60)
GFR, EST NON AFRICAN AMERICAN: 31 mL/min — AB (ref >60)
Glucose, Bld: 82 mg/dL (ref 65–99)
Phosphorus: 3.3 mg/dL (ref 2.5–4.6)
Potassium: 3.7 mmol/L (ref 3.5–5.1)
Sodium: 142 mmol/L (ref 135–145)

## 2016-01-25 NOTE — Progress Notes (Signed)
PROGRESS NOTE    Rachel Morgan  PJA:[id number] DOB: June 26, 1950 DOA: 01/19/2016 PCP: [facility]    Brief Narrative:  66 year old female with a history of cerebral palsy, recurrent UTIs, CKD stage III, history of kidney stone s/p left urteral stent by Dr. Jeffie Pollock in 10/2015, presented with decreased urine output/dark urine. She was found to have a urinary tract infection with Escherichia coli and worsening renal failure with a creatinine of 4.5. She was started on IV fluids and IV antibiotics. Nephrology following patient and changed fluid to bicarbonate infusion. She began developing fevers and CT abd revealed 40m stone moderate right hydronephrosis. Case discussed with urology who felt that she would need a ureteral stent and [facility] Patient underwent Double J Stent by Urology on 01/22/2016 and has improved steadily. Creatinine is trending down and patient is finishing course of IV Meropenem for ESBL E Coli.   Assessment & Plan:   Principal Problem:   AKI (acute kidney injury) ([place] Active Problems:   Dehydration   Hypothyroidism   Hypertension   CKD (chronic kidney disease), stage III   Urinary tract infectious disease   Cerebral palsy (HCC)   Hydronephrosis with renal and ureteral calculus obstruction   Hyperkalemia   PICC (peripherally inserted central catheter) in place   Urinary tract infection without hematuria  1. AKI from postobstructive uropathy with hydronephrosis s/p Double J Stent POD3 on CKD 3, improving -Patient's baseline creatinine is 1.3-1.7. She presented with a creatinine of 4.5.  -Etiologies included dehydration/NSAID use/recent Bactrim as well as post obstructive uropathy with hydronephrosis from [place]  -Nephrology was following at [facility] -D/C'd on IV fluids with bicarbonate at 100 mL/hr + 50 mEQ of Bicarb because Bicarb was now 2971-Abd CT shows right sided renal stone with  hydronephrosis.  -[facility] double J stent placement 01/22/2016. -Renal function trending down patient with good urinary output Cr is now down to 1.70. (EGFR is improved to 36) -Foley Catheter D/C'd; C/w Oxybutynin 10 mg po Daily  -Follow up with [facility] to discuss definitive stone management as an outpatient  2. ESBL E Coli Urinary tract infection.  -Recent culture drawn on 1/10 positive for Escherichia coli, ESBL.  -Continue IV Meropenem (Day 6 of 7) -Will complete therapy tomorrow  3. Hyperkalemia. -Resolved. Cr was 3.7  4. Hypothyroidism.  -Continue Levothyroxine 50 mcg po Daily  5. 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 -Stable and unchanged. -C/w Escitalopram 20 mg po qHS, Olanzapine 20 mg po Daily, Buspar 10 mg po Daily, and with Alprazolam 1 mg po BID   6. Anemia -Hb/Hct went from 8.6/26.1 -> 9.4/29.5 -Suspect this is related to chronic renal disease and iron deficiency anemia. No signs of bleeding.  -Given IV Feraheme and placed on oral iron supplementation with Niferex 150 mg po Daily.  -Continue to follow.  7. Metabolic acidosis.  -Lactic acid normal. Improving with bicarbonate drip.  -Bicarbonate is 29 so will discontine Bicarb infusion.   8. GERD -Contine Pantoprazole  DVT prophylaxis: Heparin Code Status: full code Family Communication: discussed with sister at bedside Disposition Plan: Home with [facility] AM as patient will complete IV Abx Therapy tomorrow  Consultants:   Nephrology: Dr. BhTheador Hawthorne/13/2018  Urology/; Dr BuPilar Jarvis/14/2018  Procedures:   PICC line 1/13>> Cystoscopy, right retrograde ureteropyelogram with fluoroscopic interpretation, right double-J stent placement-6 French by 24 centimeter without tether--Dr. DaDiona Fanti/15/2017  Antimicrobials:   Meropenem 1/12>>  Subjective: Patient alert, has no issues. Per sister patient improving with good UOP and no complaints.    Objective: Vitals:   01/24/16 0613 01/24/16 1400 01/24/16 2145 01/25/16 0551  BP: (!) 108/49 109/64 (!) 127/58 (!) 122/50  Pulse: (!) 55 (!) 56 (!) 52 (!) 56  Resp: _0 Temp: 97.6 F (36.4 C) 98.4 F (36.9 C) 97.8 F (36.6 C) 98.6 F (37 C)  TempSrc: Oral Oral Oral Oral  SpO2: 96% 99% 98% 94%  Weight:      Height:        Intake/Output Summary (Last 24 hours) at 01/25/16 1328 Last data filed at 01/25/16 0600  Gross per 24 hour  Intake             2840 ml  Output                0 ml  Net             2840 ml   Filed Weights   01/19/16 1207 01/19/16 2059 01/23/16 0900  Weight: 124.7 kg (275 lb) 117.1 kg (258 lb 1.6 oz) 117.1 kg (258 lb 1.6 oz)    Examination:  General exam: NAD sitting comfortably at bedside; Unable to provide  Respiratory system: Clear to auscultation anterior lung fields. Respiratory effort normal. Patient not tachypenic or using any accessory muscles to breathe.  Cardiovascular system: S1 & S2 heard, RRR. No JVD, murmurs, rubs, gallops or clicks. No pedal edema. Gastrointestinal system: Abdomen is nondistended, soft and nontender. No organomegaly or masses felt. Normal bowel sounds heard x4. Central nervous system: Alert. No focal neurological deficits appreciated. Extremities: Symmetric 5 x 5 power. Skin: No rashes, lesions or ulcers on limited skin evaluation Psychiatry: unable assess due to MR   Data Reviewed: I have personally reviewed following labs and imaging studies  CBC:  Recent Labs Lab 01/21/16 0650 01/22/16 1031 01/23/16 0449 01/24/16 0500 01/25/16 0924  WBC 6.6 6.8 6.7 5.4 5.3  NEUTROABS  --  4.2  --   --  3.1  HGB 9.6* 8.8* 8.1* 8.6* 9.4*  HCT 29.1* 27.1* 25.1* 26.1* 29.5*  MCV 89.5 88.0 88.4 88.8 89.7  PLT 219 182 175 195 374   Basic Metabolic Panel:  Recent Labs Lab 01/21/16 1928 01/22/16 1031 01/23/16 0456 01/24/16 0500 01/25/16 0436  NA 134* 138 140 141 142  K 5.9* 5.9* 4.0 3.8 3.7  CL 105 110 107 107  106  CO2 21* _1 GLUCOSE 107* 84 72 85 82  BUN 53* 53* 43* 31* 24*  CREATININE 4.50* 3.98* 2.99* 2.20* 1.70*  CALCIUM 8.2* 7.9* 7.2* 7.6* 7.7*  PHOS  --  4.4 5.0* 4.0 3.3   GFR: Estimated Creatinine Clearance: 39.6 mL/min (by C-G formula based on SCr of 1.7 mg/dL (H)). Liver Function Tests:  Recent Labs Lab 01/19/16 1312 01/22/16 1031 01/23/16 0456 01/24/16 0500 01/25/16 0436  AST 24  --   --   --   --   ALT 16  --   --   --   --   ALKPHOS 78  --   --   --   --   BILITOT 0.3  --   --   --   --   PROT 7.9  --   --   --   --   ALBUMIN 3.3* 2.2* 2.0* 2.1* 2.2*    Recent Labs Lab 01/19/16 1312  LIPASE  11   No results for input(s): AMMONIA in the last 168 hours. Coagulation Profile: No results for input(s): INR, PROTIME in the last 168 hours. Cardiac Enzymes: No results for input(s): CKTOTAL, CKMB, CKMBINDEX, TROPONINI in the last 168 hours. BNP (last 3 results) No results for input(s): PROBNP in the last 8760 hours. HbA1C: No results for input(s): HGBA1C in the last 72 hours. CBG:  Recent Labs Lab 01/21/16 1950  GLUCAP 104*   Lipid Profile: No results for input(s): CHOL, HDL, LDLCALC, TRIG, CHOLHDL, LDLDIRECT in the last 72 hours. Thyroid Function Tests: No results for input(s): TSH, T4TOTAL, FREET4, T3FREE, THYROIDAB in the last 72 hours. Anemia Panel: No results for input(s): VITAMINB12, FOLATE, FERRITIN, TIBC, IRON, RETICCTPCT in the last 72 hours. Sepsis Labs:  Recent Labs Lab 01/19/16 1646 01/19/16 1854 01/22/16 1031  LATICACIDVEN 0.8 2.0* 0.6    Recent Results (from the past 240 hour(s))  Urine culture     Status: Abnormal   Collection Time: 01/17/16 12:29 PM  Result Value Ref Range Status   Specimen Description URINE, CATHETERIZED  Final   Special Requests NONE  Final   Culture (A)  Final    >=100,000 COLONIES/mL ESCHERICHIA COLI Confirmed Extended Spectrum Beta-Lactamase Producer (ESBL) Performed at [facility]    Report  Status 01/20/2016 FINAL  Final   Organism ID, Bacteria ESCHERICHIA COLI (A)  Final      Susceptibility   Escherichia coli - MIC*    AMPICILLIN >=32 RESISTANT Resistant     CEFAZOLIN >=64 RESISTANT Resistant     CEFTRIAXONE >=64 RESISTANT Resistant     CIPROFLOXACIN >=4 RESISTANT Resistant     GENTAMICIN >=16 RESISTANT Resistant     IMIPENEM <=0.25 SENSITIVE Sensitive     NITROFURANTOIN <=16 SENSITIVE Sensitive     TRIMETH/SULFA <=20 SENSITIVE Sensitive     AMPICILLIN/SULBACTAM >=32 RESISTANT Resistant     PIP/TAZO 8 SENSITIVE Sensitive     Extended ESBL POSITIVE Resistant     * >=100,000 COLONIES/mL ESCHERICHIA COLI  Urine culture     Status: None   Collection Time: 01/19/16  3:12 PM  Result Value Ref Range Status   Specimen Description URINE, CLEAN CATCH  Final   Special Requests NONE  Final   Culture NO GROWTH Performed at [facility]   Final   Report Status 01/21/2016 FINAL  Final  Culture, blood (routine x 2)     Status: None (Preliminary result)   Collection Time: 01/21/16  7:00 AM  Result Value Ref Range Status   Specimen Description BLOOD LEFT ANTECUBITAL  Final   Special Requests BOTTLES DRAWN AEROBIC AND ANAEROBIC 6CC  Final   Culture NO GROWTH 4 DAYS  Final   Report Status PENDING  Incomplete  Culture, blood (routine x 2)     Status: None (Preliminary result)   Collection Time: 01/21/16  8:40 AM  Result Value Ref Range Status   Specimen Description BLOOD PICC LINE  Final   Special Requests BOTTLES DRAWN AEROBIC AND ANAEROBIC 6CC  Final   Culture NO GROWTH 4 DAYS  Final   Report Status PENDING  Incomplete     Radiology Studies: No results found.  Scheduled Meds: . ALPRAZolam  1 mg Oral BID  . aspirin EC  81 mg Oral q morning - 10a  . busPIRone  10 mg Oral q morning - 10a  . cyanocobalamin  1,000 mcg Intramuscular Daily  . escitalopram  20 mg Oral QHS  . heparin  5,000 Units Subcutaneous  Q8H  . iron polysaccharides  150 mg Oral Daily  .  levothyroxine  50 mcg Oral QAC breakfast  . mouth rinse  15 mL Mouth Rinse BID  . meropenem (MERREM) IV  1 g Intravenous Q12H  . OLANZapine  20 mg Oral Daily  . oxybutynin  10 mg Oral q morning - 10a  . pantoprazole  40 mg Oral Daily  . senna-docusate  1 tablet Oral BID   Continuous Infusions: . sodium chloride 0.45 % 1,000 mL with sodium bicarbonate 50 mEq infusion 100 mL/hr at 01/25/16 0733     LOS: 6 days    Kerney Elbe, DO [facility] Pager [phone]  If 7PM-7AM, please contact night-coverage [email or web address] Password [email or web address] 01/25/2016, 1:28 PM

## 2016-01-26 DIAGNOSIS — N3000 Acute cystitis without hematuria: Secondary | ICD-10-CM

## 2016-01-26 DIAGNOSIS — G808 Other cerebral palsy: Secondary | ICD-10-CM

## 2016-01-26 LAB — RENAL FUNCTION PANEL
ALBUMIN: 2.2 g/dL — AB (ref 3.5–5.0)
Anion gap: 6 (ref 5–15)
BUN: 18 mg/dL (ref 6–20)
CO2: 28 mmol/L (ref 22–32)
CREATININE: 1.38 mg/dL — AB (ref 0.44–1.00)
Calcium: 7.8 mg/dL — ABNORMAL LOW (ref 8.9–10.3)
Chloride: 108 mmol/L (ref 101–111)
GFR calc Af Amer: 46 mL/min — ABNORMAL LOW (ref >60)
GFR, EST NON AFRICAN AMERICAN: 40 mL/min — AB (ref >60)
Glucose, Bld: 84 mg/dL (ref 65–99)
Phosphorus: 2.7 mg/dL (ref 2.5–4.6)
Potassium: 3.5 mmol/L (ref 3.5–5.1)
SODIUM: 142 mmol/L (ref 135–145)

## 2016-01-26 LAB — BASIC METABOLIC PANEL
Anion gap: 6 (ref 5–15)
BUN: 19 mg/dL (ref 6–20)
CHLORIDE: 108 mmol/L (ref 101–111)
CO2: 29 mmol/L (ref 22–32)
CREATININE: 1.46 mg/dL — AB (ref 0.44–1.00)
Calcium: 8 mg/dL — ABNORMAL LOW (ref 8.9–10.3)
GFR calc Af Amer: 43 mL/min — ABNORMAL LOW (ref >60)
GFR, EST NON AFRICAN AMERICAN: 37 mL/min — AB (ref >60)
GLUCOSE: 85 mg/dL (ref 65–99)
Potassium: 3.6 mmol/L (ref 3.5–5.1)
SODIUM: 143 mmol/L (ref 135–145)

## 2016-01-26 LAB — CULTURE, BLOOD (ROUTINE X 2)
CULTURE: NO GROWTH
Culture: NO GROWTH

## 2016-01-26 MED ORDER — DOCUSATE SODIUM 283 MG RE ENEM: 1.0000 | ENEMA | RECTAL | 0 refills | Status: DC | PRN

## 2016-01-26 MED ORDER — SULFAMETHOXAZOLE-TRIMETHOPRIM 800-160 MG PO TABS: 1.0000 | ORAL_TABLET | Freq: Two times a day (BID) | ORAL | 0 refills | Status: DC

## 2016-01-26 MED ORDER — HYDROXYZINE HCL 25 MG PO TABS: 25.0000 mg | ORAL_TABLET | Freq: Three times a day (TID) | ORAL | 0 refills | Status: DC | PRN

## 2016-01-26 MED ORDER — SENNOSIDES-DOCUSATE SODIUM 8.6-50 MG PO TABS: 1.0000 | ORAL_TABLET | Freq: Two times a day (BID) | ORAL | 0 refills | Status: AC

## 2016-01-26 MED ORDER — POLYSACCHARIDE IRON COMPLEX 150 MG PO CAPS: 150.0000 mg | ORAL_CAPSULE | Freq: Every day | ORAL | 0 refills | Status: DC

## 2016-01-26 MED ORDER — SULFAMETHOXAZOLE-TRIMETHOPRIM 800-160 MG PO TABS: 1.0000 | ORAL_TABLET | Freq: Two times a day (BID) | ORAL | Status: DC

## 2016-01-26 MED ORDER — NEPRO/CARBSTEADY PO LIQD: 237.0000 mL | Freq: Three times a day (TID) | ORAL | 0 refills | Status: DC | PRN

## 2016-01-26 MED ORDER — CAMPHOR-MENTHOL 0.5-0.5 % EX LOTN: 1.0000 "application " | TOPICAL_LOTION | Freq: Three times a day (TID) | CUTANEOUS | 0 refills | Status: DC | PRN

## 2016-01-26 NOTE — Discharge Summary (Signed)
Physician Discharge Summary  Rachel Morgan QIW:[id number] DOB: 05/22/1950 DOA: 01/19/2016  PCP: [facility]  Admit date: 01/19/2016 Discharge date: 01/26/2016  Admitted From: Home Disposition:  Home with Home Health  Recommendations for Outpatient Follow-up:  1. Follow up with PCP in 1-2 weeks 2. Follow up with [facility] in [place] as an outpatient for definitive stone management 3. Please obtain BMP/CBC in one week  Home Health: YES Equipment/Devices: None  Discharge Condition: Stable CODE STATUS: FULL CODE Diet recommendation: Heart Healthy   Brief/Interim Summary: 66 year old female with a history of cerebral palsy, recurrent UTIs, CKD stage III, history of kidney stone s/p left urteral stent by Dr. Jeffie Pollock in 10/2015, presented with decreased urine output/dark urine to [facility]. She was found to have a urinary tract infection with Escherichia coli and worsening renal failure with a creatinine of 4.5. She was started on IV fluids and IV antibiotics. Nephrology followed patient and changed fluid to bicarbonate infusion. She began developing fevers and CT abd revealed 1m stone moderate right hydronephrosis. Case discussed with urology who felt that she would need a ureteral stent and [facility] Patient underwent Double J Stent by Urology on 01/22/2016 and has improved steadily. Creatinine is trending down and patient is finishing course of IV Meropenem for ESBL E Coli. She was transitioned to po Abx with Bactrim and was deemed medically stable to be D/C'd Home and follow up with PCP and [facility] [place]   Discharge Diagnoses:  Principal Problem:   AKI (acute kidney injury) ([place] Active Problems:   Dehydration   Hypothyroidism   Hypertension   CKD (chronic kidney disease), stage III   Urinary tract infectious disease   Cerebral palsy (HCC)   Hydronephrosis with renal and  ureteral calculus obstruction   Hyperkalemia   PICC (peripherally inserted central catheter) in place   Urinary tract infection without hematuria   1. AKI from postobstructive uropathy with hydronephrosis s/p Double J Stent POD3 on CKD 3, improving -Patient's baseline creatinine is 1.3-1.7. She presented with a creatinine of 4.5.  -Etiologies included dehydration/NSAID use/recent Bactrim as well as post obstructive uropathy with hydronephrosis from [place] -[facility] -D/C'd on IV fluids with bicarbonate at 100 mL/hr + 50 mEQ of Bicarb because Bicarb was now 2959-Abd CT shows right sided renal stone with hydronephrosis.  -[facility] double J stent placement 01/22/2016. -Renal function trending down patient with good urinary output Cr is now down to 1.38. (EGFR is improved to 46) -Foley Catheter D/C'd; C/w Oxybutynin 10 mg po Daily  -Follow up with [facility] [place] to discuss definitive stone management as an outpatient -Repeat CMP as an outpatient.   2. ESBL E Coli Urinary tract infection.  -Recent culture drawn on 1/10 positive for Escherichia coli, ESBL.  -Continue IV Meropenem (Day 7 of 7); Transitioned to po Bactrim for 3 more days because of complicated UTI  3. Hyperkalemia. -Resolved. Cr was 3.5  4. Hypothyroidism.  -Continue Levothyroxine 50 mcg po Daily  5. 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 -Stable and unchanged. -C/w Escitalopram 20 mg po qHS, Olanzapine 20 mg po Daily, Buspar 10 mg po Daily, and with Alprazolam 1 mg po BID; Continue additional home meds  6. Anemia -Hb/Hct went from 8.6/26.1 -> 9.4/29.5 -Suspect this is related to chronic renal disease and iron deficiency anemia. No signs of bleeding.  -  Given IV Feraheme and placed on oral iron supplementation with Niferex 150 mg po Daily.  -Continue to follow CBC as an outpatient  7. Metabolic acidosis.  -Lactic acid  normal. Improving with bicarbonate drip.  -Bicarbonate is 29 so will discontine Bicarb infusion. -Repeat CMP as an outpatient    8. GERD -Contine Pantoprazole  Discharge Instructions  Discharge Instructions    Call MD for:  difficulty breathing, headache or visual disturbances    Complete by:  As directed    Call MD for:  extreme fatigue    Complete by:  As directed    Call MD for:  persistant dizziness or light-headedness    Complete by:  As directed    Call MD for:  persistant nausea and vomiting    Complete by:  As directed    Call MD for:  severe uncontrolled pain    Complete by:  As directed    Call MD for:  temperature >100.4    Complete by:  As directed    Diet - low sodium heart healthy    Complete by:  As directed    Discharge instructions    Complete by:  As directed    Follow up with PCP and with [facility] in [place]. Take all medications as prescribed. If symoptoms change or worsen please return to the ED for evaluation.   Increase activity slowly    Complete by:  As directed      Allergies as of 01/26/2016      Reactions   Macrobid [nitrofurantoin Monohyd Macro] Hives, Swelling   Penicillins Swelling, Rash   Has patient had a PCN reaction causing immediate rash, facial/tongue/throat swelling, SOB or lightheadedness with hypotension: Yes Has patient had a PCN reaction causing severe rash involving mucus membranes or skin necrosis: Yes Has patient had a PCN reaction that required hospitalization: no Has patient had a PCN reaction occurring within the last 10 years: no If all of the above answers are "NO", then may proceed with Cephalosporin use. Patient tolerated rocephin and ertapenem in the past      Medication List    STOP taking these medications   cephALEXin 500 MG capsule Commonly known as:  KEFLEX   ibuprofen 200 MG tablet Commonly known as:  ADVIL,MOTRIN   traMADol 50 MG tablet Commonly known as:  ULTRAM     TAKE these medications    ALPRAZolam 1 MG tablet Commonly known as:  XANAX Take 1 mg by mouth 2 (two) times daily.   aspirin EC 81 MG tablet Take 81 mg by mouth every morning.   busPIRone 10 MG tablet Commonly known as:  BUSPAR Take 10 mg by mouth every morning.   camphor-menthol lotion Commonly known as:  SARNA Apply 1 application topically every 8 (eight) hours as needed for itching.   chlorproMAZINE 25 MG tablet Commonly known as:  THORAZINE Take 25 mg by mouth at bedtime.   docusate sodium 283 MG enema Commonly known as:  ENEMEEZ Place 1 enema (283 mg total) rectally as needed for severe constipation.   escitalopram 20 MG tablet Commonly known as:  LEXAPRO Take 20 mg by mouth at bedtime.   feeding supplement (NEPRO CARB STEADY) Liqd Take 237 mLs by mouth 3 (three) times daily as needed (Supplement).   hydrOXYzine 25 MG tablet Commonly known as:  ATARAX/VISTARIL Take 1 tablet (25 mg total) by mouth every 8 (eight) hours as needed for itching.   iron polysaccharides 150 MG capsule Commonly known as:  NIFEREX Take 1 capsule (150 mg total) by mouth daily. Start taking on:  01/27/2016   levothyroxine 50 MCG tablet Commonly known as:  SYNTHROID, LEVOTHROID Take 50 mcg by mouth daily before breakfast.   OLANZapine 20 MG tablet Commonly known as:  ZYPREXA Take 20 mg by mouth daily.   omeprazole 20 MG capsule Commonly known as:  PRILOSEC Take 20 mg by mouth every morning.   ondansetron 4 MG disintegrating tablet Commonly known as:  ZOFRAN ODT Take 1 tablet (4 mg total) by mouth every 8 (eight) hours as needed for nausea.   oxybutynin 10 MG 24 hr tablet Commonly known as:  DITROPAN-XL Take 10 mg by mouth every morning.   senna-docusate 8.6-50 MG tablet Commonly known as:  Senokot-S Take 1 tablet by mouth 2 (two) times daily.   spironolactone 50 MG tablet Commonly known as:  ALDACTONE Take 50 mg by mouth daily.   sulfamethoxazole-trimethoprim 800-160 MG tablet Commonly known as:   BACTRIM DS,SEPTRA DS Take 1 tablet by mouth every 12 (twelve) hours. Start taking on:  01/27/2016   zolpidem 10 MG tablet Commonly known as:  AMBIEN Take 10 mg by mouth at bedtime.      Follow-up Information    [facility] [place] Follow up.   Why:  Discuss definitive stone surgery with either Dr. Diona Fanti, Dr. Jeffie Pollock, or Dr. Alyson Ingles. Contact information: [address], [address] [place] [address] [place] [facility]. Call in 1 week(s).   Why:  Call to schedule an Appointment.  Contact information: [address] [place] [address] [phone]          Allergies  Allergen Reactions  . Macrobid [Nitrofurantoin Monohyd Macro] Hives and Swelling  . Penicillins Swelling and Rash    Has patient had a PCN reaction causing immediate rash, facial/tongue/throat swelling, SOB or lightheadedness with hypotension: Yes Has patient had a PCN reaction causing severe rash involving mucus membranes or skin necrosis: Yes Has patient had a PCN reaction that required hospitalization: no Has patient had a PCN reaction occurring within the last 10 years: no If all of the above answers are "NO", then may proceed with Cephalosporin use. Patient tolerated rocephin and ertapenem in the past    Consultations:  Nephrology at [facility]  Urology at [facility]  Procedures/Studies: US Renal  Result Date: 01/20/2016 CLINICAL DATA:  Acute kidney injury. History of a left ureteral stent. EXAM: RENAL / URINARY TRACT ULTRASOUND COMPLETE COMPARISON:  CT, 12/11/2015 FINDINGS: Right Kidney: Limited visualization due to bowel gas and shadowing gallstones. Kidney does not appear enlarged. There is evidence of mild right hydronephrosis. Left Kidney: Length: 10.9 cm. Mild renal cortical thinning. Normal parenchymal echogenicity. 7 mm stone in the lower pole. No hydronephrosis. Bladder: Appears normal for degree of bladder distention. IMPRESSION: 1.  Incompletely imaged right kidney. Evidence of mild right hydronephrosis. 2. Left intrarenal stone.  No left hydronephrosis. 3. Multiple gallstones. Electronically Signed   By: Lajean Manes M.D.   On: 01/20/2016 15:02   Dg Chest Port 1 View  Result Date: 01/20/2016 CLINICAL DATA:  PICC line placement EXAM: PORTABLE CHEST 1 VIEW COMPARISON:  January 19, 2016 FINDINGS: The right PICC line terminates below the brachiocephalic confluence in the SVC. Mild edema. Small amount of fluid in the right fissure. Stable cardiomegaly. No other change. IMPRESSION: The PICC line is in good position. There is mild pulmonary edema and probable fluid in the right  fissure. Electronically Signed   By: Dorise Bullion III M.D   On: 01/20/2016 12:05   Dg Chest Port 1 View  Result Date: 01/19/2016 CLINICAL DATA:  Hypertension.  Diabetes mellitus. EXAM: PORTABLE CHEST 1 VIEW COMPARISON:  10/17/2015 FINDINGS: The heart is moderately enlarged. Vascular congestion. No evidence of interstitial edema. Lungs are under aerated. No obvious consolidation. IMPRESSION: Cardiomegaly and vascular congestion are compatible with mild volume overload. Electronically Signed   By: Marybelle Killings M.D.   On: 01/19/2016 17:12   Dg Foot Complete Right  Result Date: 01/17/2016 CLINICAL DATA:  Trauma, bruising of the fourth toe EXAM: RIGHT FOOT COMPLETE - 3+ VIEW COMPARISON:  None. FINDINGS: There is no evidence of fracture or dislocation. There is no evidence of arthropathy or other focal bone abnormality. Soft tissues are unremarkable. IMPRESSION: No acute osseous injury of the right foot. Electronically Signed   By: Kathreen Devoid   On: 01/17/2016 13:46   Ct Renal Stone Study  Result Date: 01/21/2016 CLINICAL DATA:  Hydronephrosis, Frequent UTI'S. HX OF cerebral palsy, CKD III, GERD,HTN, kidney stones, gall stones EXAM: CT ABDOMEN AND PELVIS WITHOUT CONTRAST TECHNIQUE: Multidetector CT imaging of the abdomen and pelvis was performed following the  standard protocol without IV contrast. COMPARISON:  Ultrasound, 01/20/2016.  CT, 12/11/2015. FINDINGS: Lower chest: No acute abnormality. Hepatobiliary: Liver is unremarkable. There multiple gallstones. No gallbladder wall thickening or inflammation. No bile duct dilation. Pancreas: Partial fatty replacement of pancreas. No pancreatic mass or evidence of inflammation. Spleen: Normal in size without focal abnormality. Adrenals/Urinary Tract: No adrenal masses. Moderate right hydronephrosis and hydroureter. This is due to a 7 mm distal ureteral stone. No other right ureteral stones. No left ureteral stone or hydronephrosis. Small nonobstructing stone in the lower pole of the right kidney. There are multiple nonobstructing stones in the left kidney. There is left renal cortical thinning. The bladder is unremarkable. Stomach/Bowel: Stomach and small bowel unremarkable. Mild to moderate increased stool is noted throughout the colon and in the rectum. No colonic wall thickening or inflammation. No evidence of bowel obstruction. Normal appendix visualized. Vascular/Lymphatic: Minimal aortic atherosclerotic calcifications. No adenopathy. Reproductive: Uterus and bilateral adnexa are unremarkable. Other: No abdominal wall hernia or abnormality. No abdominopelvic ascites. Musculoskeletal: No fracture or acute finding. No osteoblastic or osteolytic lesions. IMPRESSION: 1. 7 mm stone in the distal right ureter causes moderate right hydronephrosis. 2. No other acute abnormality. 3. Bilateral nonobstructing intrarenal stones. Chronic left renal cortical thinning/scarring. 4. Gallstones.  No acute cholecystitis. 5. Mild to moderate increased stool noted throughout the colon and in the rectum. Electronically Signed   By: Lajean Manes M.D.   On: 01/21/2016 16:21   Subjective: Patient unable to provide subjective history because of mental status but per sister was doing well. No acute complaints and she is urinating well.    Discharge Exam: Vitals:   01/25/16 2053 01/26/16 0513  BP: 114/68 (!) 121/39  Pulse: (!) 54 64  Resp: 18 16  Temp: 98.4 F (36.9 C) 99.5 F (37.5 C)   Vitals:   01/25/16 1331 01/25/16 1357 01/25/16 2053 01/26/16 0513  BP: (!) 124/43 (!) 152/69 114/68 (!) 121/39  Pulse: (!) 51 (!) 59 (!) 54 64  Resp: _0 Temp: 98.3 F (36.8 C) 98.3 F (36.8 C) 98.4 F (36.9 C) 99.5 F (37.5 C)  TempSrc: Oral Oral Oral Oral  SpO2: 94% 99% 97% 93%  Weight:      Height:  General: Pt is awake, not in acute distress; Has Cerebal Palsy and MR Cardiovascular: RRR, S1/S2 +, no rubs, no gallops Respiratory: CTA bilaterally, no wheezing, no rhonchi Abdominal: Soft, NT, ND, bowel sounds + Extremities: no edema, no cyanosis  The results of significant diagnostics from this hospitalization (including imaging, microbiology, ancillary and laboratory) are listed below for reference.     Microbiology: Recent Results (from the past 240 hour(s))  Urine culture     Status: Abnormal   Collection Time: 01/17/16 12:29 PM  Result Value Ref Range Status   Specimen Description URINE, CATHETERIZED  Final   Special Requests NONE  Final   Culture (A)  Final    >=100,000 COLONIES/mL ESCHERICHIA COLI Confirmed Extended Spectrum Beta-Lactamase Producer (ESBL) Performed at [facility]    Report Status 01/20/2016 FINAL  Final   Organism ID, Bacteria ESCHERICHIA COLI (A)  Final      Susceptibility   Escherichia coli - MIC*    AMPICILLIN >=32 RESISTANT Resistant     CEFAZOLIN >=64 RESISTANT Resistant     CEFTRIAXONE >=64 RESISTANT Resistant     CIPROFLOXACIN >=4 RESISTANT Resistant     GENTAMICIN >=16 RESISTANT Resistant     IMIPENEM <=0.25 SENSITIVE Sensitive     NITROFURANTOIN <=16 SENSITIVE Sensitive     TRIMETH/SULFA <=20 SENSITIVE Sensitive     AMPICILLIN/SULBACTAM >=32 RESISTANT Resistant     PIP/TAZO 8 SENSITIVE Sensitive     Extended ESBL POSITIVE Resistant     * >=100,000  COLONIES/mL ESCHERICHIA COLI  Urine culture     Status: None   Collection Time: 01/19/16  3:12 PM  Result Value Ref Range Status   Specimen Description URINE, CLEAN CATCH  Final   Special Requests NONE  Final   Culture NO GROWTH Performed at [facility]   Final   Report Status 01/21/2016 FINAL  Final  Culture, blood (routine x 2)     Status: None   Collection Time: 01/21/16  7:00 AM  Result Value Ref Range Status   Specimen Description BLOOD LEFT ANTECUBITAL  Final   Special Requests BOTTLES DRAWN AEROBIC AND ANAEROBIC 6CC  Final   Culture NO GROWTH 5 DAYS  Final   Report Status 01/26/2016 FINAL  Final  Culture, blood (routine x 2)     Status: None   Collection Time: 01/21/16  8:40 AM  Result Value Ref Range Status   Specimen Description BLOOD PICC LINE  Final   Special Requests BOTTLES DRAWN AEROBIC AND ANAEROBIC 6CC  Final   Culture NO GROWTH 5 DAYS  Final   Report Status 01/26/2016 FINAL  Final    Labs: BNP (last 3 results) No results for input(s): BNP in the last 8760 hours. Basic Metabolic Panel:  Recent Labs Lab 01/22/16 1031 01/23/16 0456 01/24/16 0500 01/25/16 0436 01/26/16 0419  NA 138 140 141 142 142  143  K 5.9* 4.0 3.8 3.7 3.5  3.6  CL 110 107 107 106 108  108  CO2 _0 GLUCOSE 84 72 85 82 84  85  BUN 53* 43* 31* 24* 18  19  CREATININE 3.98* 2.99* 2.20* 1.70* 1.38*  1.46*  CALCIUM 7.9* 7.2* 7.6* 7.7* 7.8*  8.0*  PHOS 4.4 5.0* 4.0 3.3 2.7   Liver Function Tests:  Recent Labs Lab 01/19/16 1312 01/22/16 1031 01/23/16 0456 01/24/16 0500 01/25/16 0436 01/26/16 0419  AST 24  --   --   --   --   --  ALT 16  --   --   --   --   --   ALKPHOS 78  --   --   --   --   --   BILITOT 0.3  --   --   --   --   --   PROT 7.9  --   --   --   --   --   ALBUMIN 3.3* 2.2* 2.0* 2.1* 2.2* 2.2*    Recent Labs Lab 01/19/16 1312  LIPASE 11   No results for input(s): AMMONIA in the last 168 hours. CBC:  Recent Labs Lab  01/21/16 0650 01/22/16 1031 01/23/16 0449 01/24/16 0500 01/25/16 0924  WBC 6.6 6.8 6.7 5.4 5.3  NEUTROABS  --  4.2  --   --  3.1  HGB 9.6* 8.8* 8.1* 8.6* 9.4*  HCT 29.1* 27.1* 25.1* 26.1* 29.5*  MCV 89.5 88.0 88.4 88.8 89.7  PLT 219 182 175 195 195   Cardiac Enzymes: No results for input(s): CKTOTAL, CKMB, CKMBINDEX, TROPONINI in the last 168 hours. BNP: Invalid input(s): POCBNP CBG:  Recent Labs Lab 01/21/16 1950  GLUCAP 104*   D-Dimer No results for input(s): DDIMER in the last 72 hours. Hgb A1c No results for input(s): HGBA1C in the last 72 hours. Lipid Profile No results for input(s): CHOL, HDL, LDLCALC, TRIG, CHOLHDL, LDLDIRECT in the last 72 hours. Thyroid function studies No results for input(s): TSH, T4TOTAL, T3FREE, THYROIDAB in the last 72 hours.  Invalid input(s): FREET3 Anemia work up No results for input(s): VITAMINB12, FOLATE, FERRITIN, TIBC, IRON, RETICCTPCT in the last 72 hours. Urinalysis    Component Value Date/Time   COLORURINE YELLOW 01/19/2016 1600   APPEARANCEUR HAZY (A) 01/19/2016 1600   LABSPEC 1.015 01/19/2016 1600   PHURINE 5.0 01/19/2016 1600   GLUCOSEU NEGATIVE 01/19/2016 1600   HGBUR MODERATE (A) 01/19/2016 1600   BILIRUBINUR NEGATIVE 01/19/2016 1600   KETONESUR NEGATIVE 01/19/2016 1600   PROTEINUR 30 (A) 01/19/2016 1600   UROBILINOGEN 1.0 02/11/2011 2035   NITRITE NEGATIVE 01/19/2016 1600   LEUKOCYTESUR LARGE (A) 01/19/2016 1600   Sepsis Labs Invalid input(s): PROCALCITONIN,  WBC,  LACTICIDVEN Microbiology Recent Results (from the past 240 hour(s))  Urine culture     Status: Abnormal   Collection Time: 01/17/16 12:29 PM  Result Value Ref Range Status   Specimen Description URINE, CATHETERIZED  Final   Special Requests NONE  Final   Culture (A)  Final    >=100,000 COLONIES/mL ESCHERICHIA COLI Confirmed Extended Spectrum Beta-Lactamase Producer (ESBL) Performed at [facility]    Report Status 01/20/2016 FINAL  Final    Organism ID, Bacteria ESCHERICHIA COLI (A)  Final      Susceptibility   Escherichia coli - MIC*    AMPICILLIN >=32 RESISTANT Resistant     CEFAZOLIN >=64 RESISTANT Resistant     CEFTRIAXONE >=64 RESISTANT Resistant     CIPROFLOXACIN >=4 RESISTANT Resistant     GENTAMICIN >=16 RESISTANT Resistant     IMIPENEM <=0.25 SENSITIVE Sensitive     NITROFURANTOIN <=16 SENSITIVE Sensitive     TRIMETH/SULFA <=20 SENSITIVE Sensitive     AMPICILLIN/SULBACTAM >=32 RESISTANT Resistant     PIP/TAZO 8 SENSITIVE Sensitive     Extended ESBL POSITIVE Resistant     * >=100,000 COLONIES/mL ESCHERICHIA COLI  Urine culture     Status: None   Collection Time: 01/19/16  3:12 PM  Result Value Ref Range Status   Specimen Description URINE, CLEAN CATCH  Final  Special Requests NONE  Final   Culture NO GROWTH Performed at [facility]   Final   Report Status 01/21/2016 FINAL  Final  Culture, blood (routine x 2)     Status: None   Collection Time: 01/21/16  7:00 AM  Result Value Ref Range Status   Specimen Description BLOOD LEFT ANTECUBITAL  Final   Special Requests BOTTLES DRAWN AEROBIC AND ANAEROBIC [place]  Final   Culture NO GROWTH 5 DAYS  Final   Report Status 01/26/2016 FINAL  Final  Culture, blood (routine x 2)     Status: None   Collection Time: 01/21/16  8:40 AM  Result Value Ref Range Status   Specimen Description BLOOD PICC LINE  Final   Special Requests BOTTLES DRAWN AEROBIC AND ANAEROBIC [facility]  Final   Culture NO GROWTH 5 DAYS  Final   Report Status 01/26/2016 FINAL  Final   Time coordinating discharge: Over 30 minutes  SIGNED:  Kerney Elbe, DO [facility] 01/26/2016, 12:06 PM Pager [phone]  If 7PM-7AM, please contact night-coverage [email or web address] Password [email or web address]

## 2016-01-26 NOTE — Progress Notes (Signed)
Information faxed to [facility] and [facility] [phone].

## 2016-01-26 NOTE — Progress Notes (Signed)
Patient transported home via ambulance, sister at the bedside during transportation. Patient in no pain/distress.

## 2016-01-26 NOTE — Progress Notes (Signed)
Discharge information given and explained to patient's sister/caregiver and they verbalized understanding, Patient in no pain/distress. Waiting on ambulance for transportation home.

## 2016-02-20 ENCOUNTER — Encounter (HOSPITAL_COMMUNITY): Payer: Self-pay | Admitting: *Deleted

## 2016-02-22 NOTE — Progress Notes (Signed)
Called and talked to Slaughters, niece of patient and informed her of time change for surgery and that patient needs to arrive at Short Stay at [facility] at [place] am . She verbalized understanding.

## 2016-02-26 ENCOUNTER — Other Ambulatory Visit: Payer: Self-pay | Admitting: Urology

## 2016-02-26 NOTE — H&P (Signed)
Expand All Collapse All   [] Hide copied text   Rachel Morgan April 10, 1950 [id number]  Referring provider: Dr. Pearletha Forge  Chief Complaint  Patient presents with  . Dehydration  . Abnormal Lab  . Abdominal Pain    HPI: The patient is a 66 year old female with a past medical history of nephrolithiasis who presented to the hospital with right flank pain and acute on chronic renal failure and was diagnosed with a urinary tract infection. Urology was consulted today after finding a 7 mm right distal ureteral stone obstructing a functionally solitary right kidney.  Her creatinine currently is approximate 4.5 with a baseline proximal 1.5. She is on meropenem for ESBL Escherichia coli urinary tract infection. Of note, the patient does have serial pollicis difficult to obtain a history from her. History was obtained from her sister who was bedside. The patient does however complain of right flank pain. At the time of admission her white blood cell count was approximately 15, but it has now normalized. She spiked a fever to 101.8 earlier today.  She has had ureteroscopy for her nephrolithiasis in the past. Most recently, it was last fall of 2017 where she was seen by Dr. Alyson Ingles and Dr. Jeffie Pollock.  She had placement of a right ureteral stent on 01/22/16 and returns now for definitive ureteroscopy.   PMH:     Past Medical History:  Diagnosis Date  . Anxiety   . Calculus of gallbladder   . Cerebral palsy ([place])   . Chronic kidney disease   . Diabetes mellitus   . Gall stones   . GERD (gastroesophageal reflux disease)   . High cholesterol   . History of kidney stones   . Hypertension   . Hypothyroidism   . Kidney stones   . MR (mental retardation)   . Pyelonephritis   . Sepsis [facility])     Surgical History:      Past Surgical History:  Procedure Laterality Date  . CYSTOSCOPY W/ URETERAL STENT PLACEMENT Bilateral 09/26/2015   Procedure: CYSTOSCOPY WITH Bilateral   RETROGRADE PYELOGRAM/URETERAL STENT PLACEMENT;  Surgeon: [name], MD;  Location: [facility];  Service: Urology;  Laterality: Bilateral;  . CYSTOSCOPY W/ URETERAL STENT REMOVAL Left 11/10/2015   Procedure: CYSTOSCOPY WITH STENT REMOVAL;  Surgeon: Cleon Gustin, MD;  Location: [facility];  Service: Urology;  Laterality: Left;  . CYSTOSCOPY/RETROGRADE/URETEROSCOPY/STONE EXTRACTION WITH BASKET Left 10/31/2015   Procedure: CYSTOSCOPY/ BILATERAL URETEROSCOPY/BILATERAL STONE EXTRACTION/ LEFT STENT PLACEMENT;  Surgeon: Irine Seal, MD;  Location: [facility];  Service: Urology;  Laterality: Left;  . HOLMIUM LASER APPLICATION N/A 16/60/6301   Procedure: HOLMIUM LASER APPLICATION;  Surgeon: Irine Seal, MD;  Location: [facility];  Service: Urology;  Laterality: N/A;    Allergies:       Allergies  Allergen Reactions  . Macrobid [Nitrofurantoin Monohyd Macro] Hives and Swelling  . Penicillins Swelling and Rash    Has patient had a PCN reaction causing immediate rash, facial/tongue/throat swelling, SOB or lightheadedness with hypotension: Yes Has patient had a PCN reaction causing severe rash involving mucus membranes or skin necrosis: Yes Has patient had a PCN reaction that required hospitalization: no Has patient had a PCN reaction occurring within the last 10 years: no If all of the above answers are "NO", then may proceed with Cephalosporin use. Patient tolerated rocephin and ertapenem in the past    Family History:      Family History  Problem Relation Age of Onset  . Family history unknown: Yes  Social History:  reports that she has never smoked. She has never used smokeless tobacco. She reports that she does not drink alcohol or use drugs.  ROS: 12 point ROS negative except for above              Physical Exam: BP 131/63 (BP Location: Left Wrist)   Pulse 62   Temp 98.1 F (36.7 C) (Oral)   Resp 18   Ht 5' (1.524 m)   Wt 258 lb 1.6 oz (117.1 kg)    SpO2 99%   BMI 50.41 kg/m   Constitutional:  Alert and oriented, No acute distress. HEENT: [place] AT, moist mucus membranes.  Trachea midline, no masses. Cardiovascular: No clubbing, cyanosis, or edema. Respiratory: Normal respiratory effort, no increased work of breathing. GI: Abdomen is soft, nontender, nondistended, no abdominal masses GU: +Right CVA tenderness.  Skin: No rashes, bruises or suspicious lesions. Lymph: No cervical or inguinal adenopathy. Neurologic: Grossly intact, no focal deficits, moving all 4 extremities. Psychiatric: Normal mood and affect.  Laboratory Data: RecentLabs       Lab Results  Component Value Date   WBC 6.6 01/21/2016   HGB 9.6 (L) 01/21/2016   HCT 29.1 (L) 01/21/2016   MCV 89.5 01/21/2016   PLT 219 01/21/2016      RecentLabs       Lab Results  Component Value Date   CREATININE 4.50 (H) 01/21/2016      RecentLabs  No results found for: PSA    RecentLabs  No results found for: TESTOSTERONE    RecentLabs  No results found for: HGBA1C    Urinalysis Labs(Brief)          Component Value Date/Time   COLORURINE YELLOW 01/19/2016 1600   APPEARANCEUR HAZY (A) 01/19/2016 1600   LABSPEC 1.015 01/19/2016 1600   PHURINE 5.0 01/19/2016 1600   GLUCOSEU NEGATIVE 01/19/2016 1600   HGBUR MODERATE (A) 01/19/2016 1600   BILIRUBINUR NEGATIVE 01/19/2016 1600   KETONESUR NEGATIVE 01/19/2016 1600   PROTEINUR 30 (A) 01/19/2016 1600   UROBILINOGEN 1.0 02/11/2011 2035   NITRITE NEGATIVE 01/19/2016 1600   LEUKOCYTESUR LARGE (A) 01/19/2016 1600      Pertinent Imaging: CLINICAL DATA: Hydronephrosis, Frequent UTI'S. HX OF cerebral palsy, CKD III, GERD,HTN, kidney stones, gall stones  EXAM: CT ABDOMEN AND PELVIS WITHOUT CONTRAST  TECHNIQUE: Multidetector CT imaging of the abdomen and pelvis was performed following the standard protocol without IV contrast.  COMPARISON: Ultrasound, 01/20/2016. CT,  12/11/2015.  FINDINGS: Lower chest: No acute abnormality.  Hepatobiliary: Liver is unremarkable. There multiple gallstones. No gallbladder wall thickening or inflammation. No bile duct dilation.  Pancreas: Partial fatty replacement of pancreas. No pancreatic mass or evidence of inflammation.  Spleen: Normal in size without focal abnormality.  Adrenals/Urinary Tract: No adrenal masses.  Moderate right hydronephrosis and hydroureter. This is due to a 7 mm distal ureteral stone. No other right ureteral stones. No left ureteral stone or hydronephrosis. Small nonobstructing stone in the lower pole of the right kidney. There are multiple nonobstructing stones in the left kidney. There is left renal cortical thinning. The bladder is unremarkable.  Stomach/Bowel: Stomach and small bowel unremarkable. Mild to moderate increased stool is noted throughout the colon and in the rectum. No colonic wall thickening or inflammation. No evidence of bowel obstruction. Normal appendix visualized.  Vascular/Lymphatic: Minimal aortic atherosclerotic calcifications. No adenopathy.  Reproductive: Uterus and bilateral adnexa are unremarkable.  Other: No abdominal wall hernia or abnormality. No abdominopelvic ascites.  Musculoskeletal: No fracture  or acute finding. No osteoblastic or osteolytic lesions.  IMPRESSION: 1. 7 mm stone in the distal right ureter causes moderate right hydronephrosis. 2. No other acute abnormality. 3. Bilateral nonobstructing intrarenal stones. Chronic left renal cortical thinning/scarring. 4. Gallstones. No acute cholecystitis. 5. Mild to moderate increased stool noted throughout the colon and in the rectum.  Assessment & Plan:    1. Right ureteral stone s/p stenting 2. Nonobstructing bilateral stones 3.  History of UTI with sepsis   I will proceed with right ureteroscopic stone extraction on 02/27/16                 ED to  Hosp-Admission (Discharged) on 01/19/2016        Routing History        Detailed Report

## 2016-02-27 ENCOUNTER — Ambulatory Visit (HOSPITAL_COMMUNITY): Payer: Medicare Other | Admitting: Certified Registered"

## 2016-02-27 ENCOUNTER — Encounter (HOSPITAL_COMMUNITY): Payer: Self-pay | Admitting: *Deleted

## 2016-02-27 ENCOUNTER — Ambulatory Visit (HOSPITAL_COMMUNITY)
Admission: RE | Admit: 2016-02-27 | Discharge: 2016-02-27 | Disposition: A | Discharge status: Home / Self Care | Payer: Medicare Other | Source: Ambulatory Visit | Attending: Urology | Admitting: Urology

## 2016-02-27 ENCOUNTER — Encounter (HOSPITAL_COMMUNITY)
Admission: RE | Disposition: A | Discharge status: Home / Self Care | Payer: Self-pay | Source: Ambulatory Visit | Attending: Urology

## 2016-02-27 DIAGNOSIS — E7800 Pure hypercholesterolemia, unspecified: Secondary | ICD-10-CM | POA: Insufficient documentation

## 2016-02-27 DIAGNOSIS — E1122 Type 2 diabetes mellitus with diabetic chronic kidney disease: Secondary | ICD-10-CM | POA: Insufficient documentation

## 2016-02-27 DIAGNOSIS — E039 Hypothyroidism, unspecified: Secondary | ICD-10-CM | POA: Insufficient documentation

## 2016-02-27 DIAGNOSIS — Z87442 Personal history of urinary calculi: Secondary | ICD-10-CM | POA: Insufficient documentation

## 2016-02-27 DIAGNOSIS — N211 Calculus in urethra: Secondary | ICD-10-CM | POA: Diagnosis not present

## 2016-02-27 DIAGNOSIS — I129 Hypertensive chronic kidney disease with stage 1 through stage 4 chronic kidney disease, or unspecified chronic kidney disease: Secondary | ICD-10-CM | POA: Diagnosis not present

## 2016-02-27 DIAGNOSIS — Z881 Allergy status to other antibiotic agents status: Secondary | ICD-10-CM | POA: Insufficient documentation

## 2016-02-27 DIAGNOSIS — N189 Chronic kidney disease, unspecified: Secondary | ICD-10-CM | POA: Diagnosis not present

## 2016-02-27 DIAGNOSIS — G809 Cerebral palsy, unspecified: Secondary | ICD-10-CM | POA: Insufficient documentation

## 2016-02-27 DIAGNOSIS — F79 Unspecified intellectual disabilities: Secondary | ICD-10-CM | POA: Diagnosis not present

## 2016-02-27 DIAGNOSIS — Z880 Allergy status to penicillin: Secondary | ICD-10-CM | POA: Insufficient documentation

## 2016-02-27 DIAGNOSIS — Z79899 Other long term (current) drug therapy: Secondary | ICD-10-CM | POA: Diagnosis not present

## 2016-02-27 DIAGNOSIS — Z9889 Other specified postprocedural states: Secondary | ICD-10-CM | POA: Insufficient documentation

## 2016-02-27 HISTORY — PX: CYSTOSCOPY/URETEROSCOPY/HOLMIUM LASER/STENT PLACEMENT: SHX6546

## 2016-02-27 LAB — CBC
HCT: 36 % (ref 36.0–46.0)
Hemoglobin: 11.6 g/dL — ABNORMAL LOW (ref 12.0–15.0)
MCH: 29.7 pg (ref 26.0–34.0)
MCHC: 32.2 g/dL (ref 30.0–36.0)
MCV: 92.1 fL (ref 78.0–100.0)
PLATELETS: 197 10*3/uL (ref 150–400)
RBC: 3.91 MIL/uL (ref 3.87–5.11)
RDW: 15 % (ref 11.5–15.5)
WBC: 6.1 10*3/uL (ref 4.0–10.5)

## 2016-02-27 LAB — BASIC METABOLIC PANEL
ANION GAP: 8 (ref 5–15)
BUN: 38 mg/dL — ABNORMAL HIGH (ref 6–20)
CO2: 20 mmol/L — ABNORMAL LOW (ref 22–32)
Calcium: 9.6 mg/dL (ref 8.9–10.3)
Chloride: 113 mmol/L — ABNORMAL HIGH (ref 101–111)
Creatinine, Ser: 2.03 mg/dL — ABNORMAL HIGH (ref 0.44–1.00)
GFR calc Af Amer: 29 mL/min — ABNORMAL LOW (ref >60)
GFR, EST NON AFRICAN AMERICAN: 25 mL/min — AB (ref >60)
GLUCOSE: 117 mg/dL — AB (ref 65–99)
POTASSIUM: 4.7 mmol/L (ref 3.5–5.1)
Sodium: 141 mmol/L (ref 135–145)

## 2016-02-27 SURGERY — CYSTOSCOPY/URETEROSCOPY/HOLMIUM LASER/STENT PLACEMENT
Anesthesia: General | Site: Ureter | Laterality: Right

## 2016-02-27 MED ORDER — SODIUM CHLORIDE 0.9 % IV SOLN: 250.0000 mL | INTRAVENOUS | Status: DC | PRN

## 2016-02-27 MED ORDER — SODIUM CHLORIDE 0.9% FLUSH: 3.0000 mL | Freq: Two times a day (BID) | INTRAVENOUS | Status: DC

## 2016-02-27 MED ORDER — FENTANYL CITRATE (PF) 100 MCG/2ML IJ SOLN
INTRAMUSCULAR | Status: AC
  Filled 2016-02-27: qty 2

## 2016-02-27 MED ORDER — LIDOCAINE 2% (20 MG/ML) 5 ML SYRINGE
INTRAMUSCULAR | Status: AC
  Filled 2016-02-27: qty 5

## 2016-02-27 MED ORDER — LACTATED RINGERS IV SOLN
INTRAVENOUS | Status: DC | PRN
  Administered 2016-02-27: 10:00:00 via INTRAVENOUS

## 2016-02-27 MED ORDER — SODIUM CHLORIDE 0.9 % IR SOLN
Status: DC | PRN
  Administered 2016-02-27: 4000 mL

## 2016-02-27 MED ORDER — SODIUM CHLORIDE 0.9% FLUSH: 3.0000 mL | INTRAVENOUS | Status: DC | PRN

## 2016-02-27 MED ORDER — DEXAMETHASONE SODIUM PHOSPHATE 10 MG/ML IJ SOLN
INTRAMUSCULAR | Status: DC | PRN
  Administered 2016-02-27: 10 mg via INTRAVENOUS

## 2016-02-27 MED ORDER — LACTATED RINGERS IV SOLN: INTRAVENOUS | Status: DC

## 2016-02-27 MED ORDER — LIDOCAINE HCL (CARDIAC) 20 MG/ML IV SOLN
INTRAVENOUS | Status: DC | PRN
  Administered 2016-02-27: 100 mg via INTRAVENOUS

## 2016-02-27 MED ORDER — MIDAZOLAM HCL 2 MG/2ML IJ SOLN
INTRAMUSCULAR | Status: AC
  Filled 2016-02-27: qty 2

## 2016-02-27 MED ORDER — OXYCODONE HCL 5 MG PO TABS: 5.0000 mg | ORAL_TABLET | ORAL | Status: DC | PRN

## 2016-02-27 MED ORDER — DEXAMETHASONE SODIUM PHOSPHATE 10 MG/ML IJ SOLN
INTRAMUSCULAR | Status: AC
  Filled 2016-02-27: qty 1

## 2016-02-27 MED ORDER — ACETAMINOPHEN 650 MG RE SUPP
650.0000 mg | RECTAL | Status: DC | PRN
  Filled 2016-02-27: qty 1

## 2016-02-27 MED ORDER — GENTAMICIN SULFATE 40 MG/ML IJ SOLN
5.0000 mg/kg | INTRAVENOUS | Status: AC
  Administered 2016-02-27: 360 mg via INTRAVENOUS
  Filled 2016-02-27: qty 9

## 2016-02-27 MED ORDER — ONDANSETRON HCL 4 MG/2ML IJ SOLN
INTRAMUSCULAR | Status: DC | PRN
  Administered 2016-02-27: 4 mg via INTRAVENOUS

## 2016-02-27 MED ORDER — PROMETHAZINE HCL 25 MG/ML IJ SOLN: 6.2500 mg | INTRAMUSCULAR | Status: DC | PRN

## 2016-02-27 MED ORDER — FENTANYL CITRATE (PF) 100 MCG/2ML IJ SOLN: 25.0000 ug | INTRAMUSCULAR | Status: DC | PRN

## 2016-02-27 MED ORDER — ONDANSETRON HCL 4 MG/2ML IJ SOLN
INTRAMUSCULAR | Status: AC
  Filled 2016-02-27: qty 2

## 2016-02-27 MED ORDER — PROPOFOL 10 MG/ML IV BOLUS
INTRAVENOUS | Status: DC | PRN
  Administered 2016-02-27: 150 mg via INTRAVENOUS

## 2016-02-27 MED ORDER — PROPOFOL 10 MG/ML IV BOLUS
INTRAVENOUS | Status: AC
  Filled 2016-02-27: qty 40

## 2016-02-27 MED ORDER — PHENYLEPHRINE HCL 10 MG/ML IJ SOLN
INTRAMUSCULAR | Status: DC | PRN
  Administered 2016-02-27: 240 ug via INTRAVENOUS

## 2016-02-27 MED ORDER — SULFAMETHOXAZOLE-TRIMETHOPRIM 800-160 MG PO TABS: 1.0000 | ORAL_TABLET | Freq: Two times a day (BID) | ORAL | 0 refills | Status: DC

## 2016-02-27 MED ORDER — ACETAMINOPHEN 325 MG PO TABS: 650.0000 mg | ORAL_TABLET | ORAL | Status: DC | PRN

## 2016-02-27 MED ORDER — SULFAMETHOXAZOLE-TRIMETHOPRIM 400-80 MG PO TABS
1.0000 | ORAL_TABLET | Freq: Once | ORAL | Status: AC
  Administered 2016-02-27: 1 via ORAL
  Filled 2016-02-27: qty 1

## 2016-02-27 MED ORDER — MIDAZOLAM HCL 5 MG/5ML IJ SOLN
INTRAMUSCULAR | Status: DC | PRN
  Administered 2016-02-27: 2 mg via INTRAVENOUS

## 2016-02-27 MED ORDER — FENTANYL CITRATE (PF) 100 MCG/2ML IJ SOLN
INTRAMUSCULAR | Status: DC | PRN
  Administered 2016-02-27 (×2): 50 ug via INTRAVENOUS

## 2016-02-27 SURGICAL SUPPLY — 23 items
BAG URO CATCHER STRL LF (MISCELLANEOUS) ×3 IMPLANT
BASKET LASER NITINOL 1.9FR (BASKET) IMPLANT
BASKET STONE NCOMPASS (UROLOGICAL SUPPLIES) IMPLANT
CATH URET 5FR 28IN OPEN ENDED (CATHETERS) ×3 IMPLANT
CATH URET DUAL LUMEN 6-10FR 50 (CATHETERS) IMPLANT
CLOTH BEACON ORANGE TIMEOUT ST (SAFETY) ×3 IMPLANT
EXTRACTOR STONE NITINOL NGAGE (UROLOGICAL SUPPLIES) ×3 IMPLANT
FIBER LASER FLEXIVA 1000 (UROLOGICAL SUPPLIES) IMPLANT
FIBER LASER FLEXIVA 365 (UROLOGICAL SUPPLIES) ×3 IMPLANT
FIBER LASER FLEXIVA 550 (UROLOGICAL SUPPLIES) IMPLANT
GLOVE SURG SS PI 8.0 STRL IVOR (GLOVE) IMPLANT
GOWN STRL REUS W/TWL XL LVL3 (GOWN DISPOSABLE) ×3 IMPLANT
GUIDEWIRE STR DUAL SENSOR (WIRE) ×3 IMPLANT
IV NS 1000ML (IV SOLUTION) ×2
IV NS 1000ML BAXH (IV SOLUTION) ×1 IMPLANT
IV NS IRRIG 3000ML ARTHROMATIC (IV SOLUTION) ×3 IMPLANT
MANIFOLD NEPTUNE II (INSTRUMENTS) ×3 IMPLANT
PACK CYSTO (CUSTOM PROCEDURE TRAY) ×3 IMPLANT
SHEATH ACCESS URETERAL 38CM (SHEATH) ×3 IMPLANT
SHEATH URET ACCESS 10/12FR (MISCELLANEOUS) IMPLANT
STENT URET 6FRX24 CONTOUR (STENTS) ×3 IMPLANT
TUBING CONNECTING 10 (TUBING) ×2 IMPLANT
TUBING CONNECTING 10' (TUBING) ×1

## 2016-02-27 NOTE — Anesthesia Procedure Notes (Signed)
Procedure Name: Intubation Date/Time: 02/27/2016 10:42 AM Performed by: Pilar Grammes Pre-anesthesia Checklist: Patient identified, Emergency Drugs available, Suction available, Patient being monitored and Timeout performed Patient Re-evaluated:Patient Re-evaluated prior to inductionOxygen Delivery Method: Circle system utilized Preoxygenation: Pre-oxygenation with 100% oxygen Intubation Type: IV induction Ventilation: Mask ventilation without difficulty Laryngoscope Size: 3 and Mac Grade View: Grade I Tube type: Oral Tube size: 7.0 mm Number of attempts: 1 Airway Equipment and Method: Stylet and Patient positioned with wedge pillow Placement Confirmation: positive ETCO2,  ETT inserted through vocal cords under direct vision,  CO2 detector and breath sounds checked- equal and bilateral Secured at: 22 cm Tube secured with: Tape Dental Injury: Teeth and Oropharynx as per pre-operative assessment

## 2016-02-27 NOTE — Progress Notes (Signed)
Ureteral stent string taped to left inner thigh.

## 2016-02-27 NOTE — Discharge Instructions (Addendum)
Ureteral Stent Implantation, Care After Introduction Refer to this sheet in the next few weeks. These instructions provide you with information about caring for yourself after your procedure. Your health care provider may also give you more specific instructions. Your treatment has been planned according to current medical practices, but problems sometimes occur. Call your health care provider if you have any problems or questions after your procedure. What can I expect after the procedure? After the procedure, it is common to have:  Nausea.  Mild pain when you urinate. You may feel this pain in your lower back or lower abdomen. Pain should stop within a few minutes after you urinate. This may last for up to 1 week.  A small amount of blood in your urine for several days. Follow these instructions at home:   Medicines  Take over-the-counter and prescription medicines only as told by your health care provider.  If you were prescribed an antibiotic medicine, take it as told by your health care provider. Do not stop taking the antibiotic even if you start to feel better.  Do not drive for 24 hours if you received a sedative.  Do not drive or operate heavy machinery while taking prescription pain medicines. Activity  Return to your normal activities as told by your health care provider. Ask your health care provider what activities are safe for you.  Do not lift anything that is heavier than 10 lb (4.5 kg). Follow this limit for 1 week after your procedure, or for as long as told by your health care provider. General instructions  Watch for any blood in your urine. Call your health care provider if the amount of blood in your urine increases.  If you have a catheter:  Follow instructions from your health care provider about taking care of your catheter and collection bag.  Do not take baths, swim, or use a hot tub until your health care provider approves.  Drink enough fluid to keep  your urine clear or pale yellow.  Keep all follow-up visits as told by your health care provider. This is important. Contact a health care provider if:  You have pain that gets worse or does not get better with medicine, especially pain when you urinate.  You have difficulty urinating.  You feel nauseous or you vomit repeatedly during a period of more than 2 days after the procedure. Get help right away if:  Your urine is dark red or has blood clots in it.  You are leaking urine (have incontinence).  The end of the stent comes out of your urethra.  You cannot urinate.  You have sudden, sharp, or severe pain in your abdomen or lower back.  You have a fever.  You may remove the stent by pulling the attached string on Friday morning.   This information is not intended to replace advice given to you by your health care provider. Make sure you discuss any questions you have with your health care provider. Document Released: 08/26/2012 Document Revised: 06/01/2015 Document Reviewed: 07/08/2014  2017 Elsevier   General Anesthesia, Adult, Care After These instructions provide you with information about caring for yourself after your procedure. Your health care provider may also give you more specific instructions. Your treatment has been planned according to current medical practices, but problems sometimes occur. Call your health care provider if you have any problems or questions after your procedure. What can I expect after the procedure? After the procedure, it is common to have:  Vomiting.  A sore throat.  Mental slowness. It is common to feel:  Nauseous.  Cold or shivery.  Sleepy.  Tired.  Sore or achy, even in parts of your body where you did not have surgery. Follow these instructions at home: For at least 24 hours after the procedure:  Do not:  Participate in activities where you could fall or become injured.  Drive.  Use heavy machinery.  Drink  alcohol.  Take sleeping pills or medicines that cause drowsiness.  Make important decisions or sign legal documents.  Take care of children on your own.  Rest. Eating and drinking  If you vomit, drink water, juice, or soup when you can drink without vomiting.  Drink enough fluid to keep your urine clear or pale yellow.  Make sure you have little or no nausea before eating solid foods.  Follow the diet recommended by your health care provider. General instructions  Have a responsible adult stay with you until you are awake and alert.  Return to your normal activities as told by your health care provider. Ask your health care provider what activities are safe for you.  Take over-the-counter and prescription medicines only as told by your health care provider.  If you smoke, do not smoke without supervision.  Keep all follow-up visits as told by your health care provider. This is important. Contact a health care provider if:  You continue to have nausea or vomiting at home, and medicines are not helpful.  You cannot drink fluids or start eating again.  You cannot urinate after 8-12 hours.  You develop a skin rash.  You have fever.  You have increasing redness at the site of your procedure. Get help right away if:  You have difficulty breathing.  You have chest pain.  You have unexpected bleeding.  You feel that you are having a life-threatening or urgent problem. This information is not intended to replace advice given to you by your health care provider. Make sure you discuss any questions you have with your health care provider. Document Released: 04/01/2000 Document Revised: 05/29/2015 Document Reviewed: 12/08/2014 Elsevier Interactive Patient Education  2017 [employer].

## 2016-02-27 NOTE — Anesthesia Postprocedure Evaluation (Addendum)
Anesthesia Post Note  Patient: Rachel Morgan  Procedure(s) Performed: Procedure(s) (LRB): CYSTOSCOPY/RIGHT URETEROSCOPY/HOLMIUM LASER/STENT PLACEMENT/STENT REMOVAL (Right)  Patient location during evaluation: PACU Anesthesia Type: General Level of consciousness: awake and alert Pain management: pain level controlled Vital Signs Assessment: post-procedure vital signs reviewed and stable Respiratory status: spontaneous breathing, nonlabored ventilation, respiratory function stable and patient connected to nasal cannula oxygen Cardiovascular status: blood pressure returned to baseline and stable Postop Assessment: no signs of nausea or vomiting Anesthetic complications: no       Last Vitals:  Vitals:   02/27/16 1232 02/27/16 1311  BP: 135/62 125/63  Pulse: 70 61  Resp: 14 16  Temp: 36.6 C 36.7 C    Last Pain:  Vitals:   02/27/16 1215  TempSrc:   PainSc: 0-No pain                 Miriana Gaertner S

## 2016-02-27 NOTE — Transfer of Care (Signed)
Immediate Anesthesia Transfer of Care Note  Patient: Rachel Morgan  Procedure(s) Performed: Procedure(s): CYSTOSCOPY/RIGHT URETEROSCOPY/HOLMIUM LASER/STENT PLACEMENT/STENT REMOVAL (Right)  Patient Location: PACU  Anesthesia Type:General  Level of Consciousness: alert  and patient cooperative  Airway & Oxygen Therapy: Patient Spontanous Breathing and Patient connected to face mask oxygen  Post-op Assessment: Report given to RN and Post -op Vital signs reviewed and stable  Post vital signs: stable  Last Vitals:  Vitals:   02/27/16 0815  BP: (!) 125/54  Pulse: 65  Resp: 16  Temp: 37.1 C    Last Pain:  Vitals:   02/27/16 0815  TempSrc: Oral         Complications: No apparent anesthesia complications

## 2016-02-27 NOTE — Op Note (Deleted)
  The note originally documented on this encounter has been moved the the encounter in which it belongs.  

## 2016-02-27 NOTE — Op Note (Signed)
NAMESHENEQUA, HOWSE              ACCOUNT NO.:  [id number]  MEDICAL RECORD NO.:  [id number]  LOCATION:                               FACILITY:  [facility]  PHYSICIAN:  Marshall Cork. Jeffie Pollock, M.D.    DATE OF BIRTH:  [birth year]  DATE OF PROCEDURE:  02/27/2016 DATE OF DISCHARGE:                              OPERATIVE REPORT   PROCEDURE: 1. Cystoscopy with removal of right double-J stent. 2. Right ureteroscopic stone extraction with holmium laser lithotripsy     and insertion of right double-J stent.  PREOPERATIVE DIAGNOSIS:  Right distal ureteral stone.  POSTOPERATIVE DIAGNOSIS:  Right distal ureteral stone.  SURGEON:  Marshall Cork. Jeffie Pollock, M.D.  ANESTHESIA:  General.  SPECIMEN:  Stone fragments.  DRAIN:  A 6-French x 24 cm right double-J stent with tether.  BLOOD LOSS:  None.  COMPLICATIONS:  None.  INDICATIONS:  Ms. Well is a 66 year old, mentally retarded white female, who was stented on January 21, 2016, for an approximately 8 mm right distal ureteral stone.  She returns now for ureteroscopy.  FINDINGS AND PROCEDURE:  She was given gentamicin.  She was taken to the operating room where general anesthetic was induced.  She was placed in lithotomy position.  Her perineum and genitalia were prepped with Betadine solution.  She was draped in usual sterile fashion.  A cystoscopy was performed using a 23-French scope and 30-degree lens. Examination revealed the stent at the right ureteral orifice with surrounding edema.  The remainder of the bladder mucosa and left ureteral orifices were unremarkable.  The stent was grasped with a grasping forceps and pulled the urethral meatus, and a guidewire was inserted through the stent to the kidney. The stent was then removed over the wire.  A 6.5-French semi-rigid ureteroscope was then passed alongside the wire. The stone was visualized, it was initially grasped with NGage basket. It was too large to remove.  The stone was then engaged with a  375- micron laser fiber, initially set on 0.5 watts at 20 hertz, but eventually 0.8 watts at 20 hertz.  The stone was broken into manageable fragments and removed using the NGage basket.  Once all significant fragments were removed, the ureteroscope was removed.  The cystoscope was used to evacuate stone fragments from the bladder and then re-inserted over the wire and a 6-French 24 cm Contour double-J stent with tether was passed without difficulty into the kidney under fluoroscopic guidance.  The wire was removed leaving good coil in the kidney and a good coil in the bladder.  The bladder was then drained.  The cystoscope was removed leaving the stent string exiting urethra.  The string was secured to the patient's inner thigh with tape. She was taken down from lithotomy position.  Her anesthetic was reversed and she was moved to the recovery room in stable condition.  There were no complications.     Marshall Cork. Jeffie Pollock, M.D.   ______________________________ Marshall Cork. Jeffie Pollock, M.D.    JJW/MEDQ  D:  02/27/2016  T:  02/27/2016  Job:  [id number]

## 2016-02-27 NOTE — Interval H&P Note (Signed)
History and Physical Interval Note:  She has some right flank pain.  She has had no fever.   Lungs CTA, CV: RRR without murmur.   02/27/2016 10:09 AM  Rachel Morgan  has presented today for surgery, with the diagnosis of RIGHT URETERAL STONE  The various methods of treatment have been discussed with the patient and family. After consideration of risks, benefits and other options for treatment, the patient has consented to  Procedure(s): CYSTOSCOPY/RIGHT URETEROSCOPY/HOLMIUM LASER/STENT PLACEMENT (Right) as a surgical intervention .  The patient's history has been reviewed, patient examined, no change in status, stable for surgery.  I have reviewed the patient's chart and labs.  Questions were answered to the patient's satisfaction.     Nayelly Laughman J

## 2016-02-27 NOTE — Progress Notes (Signed)
String remains taped

## 2016-02-27 NOTE — Anesthesia Preprocedure Evaluation (Signed)
Anesthesia Evaluation  Patient identified by MRN, date of birth, ID band Patient awake    Reviewed: Allergy & Precautions, NPO status , Patient's Chart, lab work & pertinent test results  Airway Mallampati: II  TM Distance: >3 FB Neck ROM: Full    Dental no notable dental hx.    Pulmonary neg pulmonary ROS,    Pulmonary exam normal breath sounds clear to auscultation       Cardiovascular hypertension, Normal cardiovascular exam Rhythm:Regular Rate:Normal     Neuro/Psych CP    GI/Hepatic Neg liver ROS, GERD  ,  Endo/Other  diabetesHypothyroidism   Renal/GU negative Renal ROS  negative genitourinary   Musculoskeletal negative musculoskeletal ROS (+)   Abdominal   Peds negative pediatric ROS (+)  Hematology negative hematology ROS (+)   Anesthesia Other Findings   Reproductive/Obstetrics negative OB ROS                             Anesthesia Physical Anesthesia Plan  ASA: III  Anesthesia Plan: General   Post-op Pain Management:    Induction: Intravenous  Airway Management Planned: Oral ETT  Additional Equipment:   Intra-op Plan:   Post-operative Plan: Extubation in OR  Informed Consent: I have reviewed the patients History and Physical, chart, labs and discussed the procedure including the risks, benefits and alternatives for the proposed anesthesia with the patient or authorized representative who has indicated his/her understanding and acceptance.   Dental advisory given  Plan Discussed with: CRNA and Surgeon  Anesthesia Plan Comments:         Anesthesia Quick Evaluation

## 2016-02-27 NOTE — Brief Op Note (Signed)
02/27/2016  11:24 AM  PATIENT:  Rachel Morgan  66 y.o. female  PRE-OPERATIVE DIAGNOSIS:  RIGHT URETERAL STONE  POST-OPERATIVE DIAGNOSIS:  right ureteral stone  PROCEDURE:  Procedure(s): CYSTOSCOPY/RIGHT URETEROSCOPY/HOLMIUM LASER/STENT PLACEMENT/STENT REMOVAL (Right)  SURGEON:  Surgeon(s) and Role:    * Irine Seal, MD - Primary  PHYSICIAN ASSISTANT:   ASSISTANTS: none   ANESTHESIA:   general  EBL:  No intake/output data recorded.  BLOOD ADMINISTERED:none  DRAINS: right 6 x 24 jj stent with tether   LOCAL MEDICATIONS USED:  NONE  SPECIMEN:  Source of Specimen:  stone fragments  DISPOSITION OF SPECIMEN:  to lab at [facility]  COUNTS:  YES  TOURNIQUET:  * No tourniquets in log *  DICTATION: .Other Dictation: Dictation Number [id number]  PLAN OF CARE: Discharge to home after PACU  PATIENT DISPOSITION:  PACU - hemodynamically stable.   Delay start of Pharmacological VTE agent (>24hrs) due to surgical blood loss or risk of bleeding: not applicable

## 2016-02-28 LAB — HEMOGLOBIN A1C
Hgb A1c MFr Bld: 5.3 % (ref 4.8–5.6)
Mean Plasma Glucose: 105

## 2016-03-05 ENCOUNTER — Ambulatory Visit: Payer: Self-pay | Admitting: General Surgery

## 2016-03-08 ENCOUNTER — Ambulatory Visit: Payer: Medicare Other | Admitting: Urology

## 2016-03-25 ENCOUNTER — Other Ambulatory Visit (HOSPITAL_COMMUNITY): Payer: Self-pay | Admitting: Family

## 2016-03-25 DIAGNOSIS — N390 Urinary tract infection, site not specified: Secondary | ICD-10-CM

## 2016-03-29 ENCOUNTER — Other Ambulatory Visit: Payer: Self-pay | Admitting: Radiology

## 2016-04-01 ENCOUNTER — Other Ambulatory Visit (HOSPITAL_COMMUNITY): Payer: Medicare Other

## 2016-04-01 ENCOUNTER — Ambulatory Visit (HOSPITAL_COMMUNITY): Admission: RE | Admit: 2016-04-01 | Payer: Medicare Other | Source: Ambulatory Visit

## 2016-04-04 ENCOUNTER — Other Ambulatory Visit: Payer: Self-pay | Admitting: Radiology

## 2016-04-05 NOTE — Patient Instructions (Signed)
Arrive at [facility] in Radiology at [facility].  NPO after midnight and have a driver.  Pts sister stated that pt would be transported by EMS.

## 2016-04-08 ENCOUNTER — Inpatient Hospital Stay (HOSPITAL_COMMUNITY)
Admission: RE | Admit: 2016-04-08 | Discharge: 2016-04-08 | Disposition: A | Discharge status: Home / Self Care | Payer: Medicare Other | Source: Ambulatory Visit | Attending: Family | Admitting: Family

## 2016-04-08 ENCOUNTER — Ambulatory Visit (HOSPITAL_COMMUNITY): Payer: Medicare Other

## 2016-04-09 ENCOUNTER — Other Ambulatory Visit: Payer: Self-pay | Admitting: Student

## 2016-04-10 ENCOUNTER — Encounter (HOSPITAL_COMMUNITY): Payer: Self-pay

## 2016-04-10 ENCOUNTER — Other Ambulatory Visit (HOSPITAL_COMMUNITY): Payer: Self-pay | Admitting: Family

## 2016-04-10 ENCOUNTER — Ambulatory Visit (HOSPITAL_COMMUNITY)
Admission: RE | Admit: 2016-04-10 | Discharge: 2016-04-10 | Disposition: A | Discharge status: Home / Self Care | Payer: Medicare Other | Source: Ambulatory Visit | Attending: Family | Admitting: Family

## 2016-04-10 DIAGNOSIS — G809 Cerebral palsy, unspecified: Secondary | ICD-10-CM | POA: Diagnosis not present

## 2016-04-10 DIAGNOSIS — N12 Tubulo-interstitial nephritis, not specified as acute or chronic: Secondary | ICD-10-CM | POA: Insufficient documentation

## 2016-04-10 DIAGNOSIS — Z7982 Long term (current) use of aspirin: Secondary | ICD-10-CM | POA: Diagnosis not present

## 2016-04-10 DIAGNOSIS — K219 Gastro-esophageal reflux disease without esophagitis: Secondary | ICD-10-CM | POA: Diagnosis not present

## 2016-04-10 DIAGNOSIS — E7800 Pure hypercholesterolemia, unspecified: Secondary | ICD-10-CM | POA: Insufficient documentation

## 2016-04-10 DIAGNOSIS — I129 Hypertensive chronic kidney disease with stage 1 through stage 4 chronic kidney disease, or unspecified chronic kidney disease: Secondary | ICD-10-CM | POA: Diagnosis not present

## 2016-04-10 DIAGNOSIS — F419 Anxiety disorder, unspecified: Secondary | ICD-10-CM | POA: Insufficient documentation

## 2016-04-10 DIAGNOSIS — N390 Urinary tract infection, site not specified: Secondary | ICD-10-CM

## 2016-04-10 DIAGNOSIS — Z87442 Personal history of urinary calculi: Secondary | ICD-10-CM | POA: Insufficient documentation

## 2016-04-10 DIAGNOSIS — Z87440 Personal history of urinary (tract) infections: Secondary | ICD-10-CM | POA: Diagnosis not present

## 2016-04-10 DIAGNOSIS — N189 Chronic kidney disease, unspecified: Secondary | ICD-10-CM | POA: Insufficient documentation

## 2016-04-10 DIAGNOSIS — E1122 Type 2 diabetes mellitus with diabetic chronic kidney disease: Secondary | ICD-10-CM | POA: Diagnosis not present

## 2016-04-10 DIAGNOSIS — E039 Hypothyroidism, unspecified: Secondary | ICD-10-CM | POA: Insufficient documentation

## 2016-04-10 DIAGNOSIS — Z881 Allergy status to other antibiotic agents status: Secondary | ICD-10-CM | POA: Insufficient documentation

## 2016-04-10 DIAGNOSIS — Z880 Allergy status to penicillin: Secondary | ICD-10-CM | POA: Diagnosis not present

## 2016-04-10 DIAGNOSIS — N342 Other urethritis: Secondary | ICD-10-CM | POA: Insufficient documentation

## 2016-04-10 DIAGNOSIS — F79 Unspecified intellectual disabilities: Secondary | ICD-10-CM | POA: Diagnosis not present

## 2016-04-10 DIAGNOSIS — Z79899 Other long term (current) drug therapy: Secondary | ICD-10-CM | POA: Diagnosis not present

## 2016-04-10 DIAGNOSIS — Z9889 Other specified postprocedural states: Secondary | ICD-10-CM | POA: Insufficient documentation

## 2016-04-10 HISTORY — PX: IR US GUIDE VASC ACCESS RIGHT: IMG2390

## 2016-04-10 HISTORY — PX: IR FLUORO GUIDE PORT INSERTION RIGHT: IMG5741

## 2016-04-10 LAB — CBC WITH DIFFERENTIAL/PLATELET
BASOS ABS: 0.1 10*3/uL (ref 0.0–0.1)
BASOS PCT: 1 %
EOS ABS: 0 10*3/uL (ref 0.0–0.7)
Eosinophils Relative: 0 %
HCT: 42.4 % (ref 36.0–46.0)
HEMOGLOBIN: 14 g/dL (ref 12.0–15.0)
LYMPHS PCT: 24 %
Lymphs Abs: 1.3 10*3/uL (ref 0.7–4.0)
MCH: 29.8 pg (ref 26.0–34.0)
MCHC: 33 g/dL (ref 30.0–36.0)
MCV: 90.2 fL (ref 78.0–100.0)
Monocytes Absolute: 0.3 10*3/uL (ref 0.1–1.0)
Monocytes Relative: 6 %
NEUTROS PCT: 69 %
Neutro Abs: 3.7 10*3/uL (ref 1.7–7.7)
PLATELETS: 206 10*3/uL (ref 150–400)
RBC: 4.7 MIL/uL (ref 3.87–5.11)
RDW: 14.2 % (ref 11.5–15.5)
WBC: 5.3 10*3/uL (ref 4.0–10.5)

## 2016-04-10 LAB — BASIC METABOLIC PANEL
Anion gap: 11 (ref 5–15)
BUN: 34 mg/dL — AB (ref 6–20)
CHLORIDE: 106 mmol/L (ref 101–111)
CO2: 23 mmol/L (ref 22–32)
Calcium: 10 mg/dL (ref 8.9–10.3)
Creatinine, Ser: 1.8 mg/dL — ABNORMAL HIGH (ref 0.44–1.00)
GFR calc non Af Amer: 29 mL/min — ABNORMAL LOW (ref >60)
GFR, EST AFRICAN AMERICAN: 33 mL/min — AB (ref >60)
Glucose, Bld: 128 mg/dL — ABNORMAL HIGH (ref 65–99)
POTASSIUM: 4.8 mmol/L (ref 3.5–5.1)
SODIUM: 140 mmol/L (ref 135–145)

## 2016-04-10 LAB — PROTIME-INR
INR: 0.94
PROTHROMBIN TIME: 12.6 s (ref 11.4–15.2)

## 2016-04-10 MED ORDER — VANCOMYCIN HCL IN DEXTROSE 1-5 GM/200ML-% IV SOLN
INTRAVENOUS | Status: AC
  Filled 2016-04-10: qty 200

## 2016-04-10 MED ORDER — MIDAZOLAM HCL 5 MG/5ML IJ SOLN
INTRAMUSCULAR | Status: AC | PRN
  Administered 2016-04-10: 1 mg via INTRAVENOUS

## 2016-04-10 MED ORDER — VANCOMYCIN HCL IN DEXTROSE 1-5 GM/200ML-% IV SOLN
1000.0000 mg | INTRAVENOUS | Status: AC
  Administered 2016-04-10: 1000 mg via INTRAVENOUS

## 2016-04-10 MED ORDER — HEPARIN SOD (PORK) LOCK FLUSH 100 UNIT/ML IV SOLN
INTRAVENOUS | Status: AC
  Filled 2016-04-10: qty 5

## 2016-04-10 MED ORDER — FENTANYL CITRATE (PF) 100 MCG/2ML IJ SOLN
INTRAMUSCULAR | Status: AC | PRN
  Administered 2016-04-10: 50 ug via INTRAVENOUS
  Administered 2016-04-10: 25 ug via INTRAVENOUS

## 2016-04-10 MED ORDER — LIDOCAINE HCL 1 % IJ SOLN
INTRAMUSCULAR | Status: AC
  Filled 2016-04-10: qty 20

## 2016-04-10 MED ORDER — FENTANYL CITRATE (PF) 100 MCG/2ML IJ SOLN
INTRAMUSCULAR | Status: AC
  Filled 2016-04-10: qty 4

## 2016-04-10 MED ORDER — LIDOCAINE HCL 1 % IJ SOLN
INTRAMUSCULAR | Status: AC | PRN
  Administered 2016-04-10: 10 mL via INTRADERMAL

## 2016-04-10 MED ORDER — MIDAZOLAM HCL 2 MG/2ML IJ SOLN
INTRAMUSCULAR | Status: AC | PRN
  Administered 2016-04-10: 0.5 mg via INTRAVENOUS

## 2016-04-10 MED ORDER — MIDAZOLAM HCL 2 MG/2ML IJ SOLN
INTRAMUSCULAR | Status: AC
  Filled 2016-04-10: qty 4

## 2016-04-10 MED ORDER — SODIUM CHLORIDE 0.9 % IV SOLN
INTRAVENOUS | Status: DC
  Administered 2016-04-10: 10:00:00 via INTRAVENOUS

## 2016-04-10 MED ORDER — HEPARIN SOD (PORK) LOCK FLUSH 100 UNIT/ML IV SOLN
INTRAVENOUS | Status: AC | PRN
  Administered 2016-04-10: 500 [IU] via INTRAVENOUS

## 2016-04-10 NOTE — H&P (Signed)
Chief Complaint: Patient was seen in consultation today for frequent UTIs  Referring Physician(s):  Dr. Jeffie Pollock  Supervising Physician: Corrie Mckusick  Patient Status: [facility] - Out-pt  History of Present Illness: Rachel Morgan is a 66 y.o. female with past medical history of mental retardation, DM, CKD, HTN, kidney stones who presents with complaint of frequent UTIs and pyelonephritis.    IR consulted for Port-A-Cath placement at the request of Dr. Irine Seal due to frequent need of antibiotics in patient with poor venous access.   Patient has been NPO.  She does not take blood thinners.  She has been in her usual state of health.   Past Medical History:  Diagnosis Date  . Anxiety   . Calculus of gallbladder   . Cerebral palsy ([place])   . Chronic kidney disease   . Diabetes mellitus   . Gall stones   . GERD (gastroesophageal reflux disease)   . High cholesterol   . History of kidney stones   . Hypertension   . Hypothyroidism   . Kidney stones   . MR (mental retardation)   . Pyelonephritis   . Sepsis [facility])     Past Surgical History:  Procedure Laterality Date  . CYSTOSCOPY W/ URETERAL STENT PLACEMENT Bilateral 09/26/2015   Procedure: CYSTOSCOPY WITH Bilateral  RETROGRADE PYELOGRAM/URETERAL STENT PLACEMENT;  Surgeon: [name], MD;  Location: [facility] ORS;  Service: Urology;  Laterality: Bilateral;  . CYSTOSCOPY W/ URETERAL STENT REMOVAL Left 11/10/2015   Procedure: CYSTOSCOPY WITH STENT REMOVAL;  Surgeon: Cleon Gustin, MD;  Location: [facility];  Service: Urology;  Laterality: Left;  . CYSTOSCOPY WITH STENT PLACEMENT Right 01/21/2016   Procedure: CYSTOSCOPY WITH STENT PLACEMENT;  Surgeon: Franchot Gallo, MD;  Location: [facility];  Service: Urology;  Laterality: Right;  . CYSTOSCOPY/RETROGRADE/URETEROSCOPY/STONE EXTRACTION WITH BASKET Left 10/31/2015   Procedure: CYSTOSCOPY/ BILATERAL URETEROSCOPY/BILATERAL STONE EXTRACTION/ LEFT STENT PLACEMENT;  Surgeon: Irine Seal, MD;   Location: [facility];  Service: Urology;  Laterality: Left;  . CYSTOSCOPY/URETEROSCOPY/HOLMIUM LASER/STENT PLACEMENT Right 02/27/2016   Procedure: CYSTOSCOPY/RIGHT URETEROSCOPY/HOLMIUM LASER/STENT PLACEMENT/STENT REMOVAL;  Surgeon: Irine Seal, MD;  Location: [facility];  Service: Urology;  Laterality: Right;  . HOLMIUM LASER APPLICATION N/A 38/25/0539   Procedure: HOLMIUM LASER APPLICATION;  Surgeon: Irine Seal, MD;  Location: [facility];  Service: Urology;  Laterality: N/A;    Allergies: Macrobid [nitrofurantoin monohyd macro] and Penicillins  Medications: Prior to Admission medications   Medication Sig Start Date End Date Taking? Authorizing Provider  acetaminophen (TYLENOL) 500 MG tablet Take 500-1,000 mg by mouth every 6 (six) hours as needed (for pain.).   Yes Historical Provider, MD  ALPRAZolam Duanne Moron) 1 MG tablet Take 1 mg by mouth 2 (two) times daily.    Yes Historical Provider, MD  aspirin EC 81 MG tablet Take 81 mg by mouth every morning.    Yes Historical Provider, MD  busPIRone (BUSPAR) 10 MG tablet Take 10 mg by mouth every morning.    Yes Historical Provider, MD  camphor-menthol [facility]) lotion Apply 1 application topically every 8 (eight) hours as needed for itching. 01/26/16  Yes [place], DO  chlorproMAZINE (THORAZINE) 25 MG tablet Take 25 mg by mouth at bedtime.    Yes Historical Provider, MD  docusate sodium (ENEMEEZ) 283 MG enema Place 1 enema (283 mg total) rectally as needed for severe constipation. 01/26/16  Yes [place], DO  escitalopram (LEXAPRO) 20 MG tablet Take 20 mg by mouth at bedtime.   Yes  Historical Provider, MD  hydrOXYzine (ATARAX/VISTARIL) 25 MG tablet Take 1 tablet (25 mg total) by mouth every 8 (eight) hours as needed for itching. 01/26/16  Yes [place], DO  iron polysaccharides (NIFEREX) 150 MG capsule Take 1 capsule (150 mg total) by mouth daily. 01/27/16  Yes [place], DO  levothyroxine (SYNTHROID, LEVOTHROID) 50 MCG tablet Take 50  mcg by mouth daily before breakfast.   Yes Historical Provider, MD  OLANZapine (ZYPREXA) 20 MG tablet Take 20 mg by mouth daily.  07/24/15  Yes Historical Provider, MD  omeprazole (PRILOSEC) 20 MG capsule Take 20 mg by mouth daily before breakfast.    Yes Historical Provider, MD  oxybutynin (DITROPAN-XL) 10 MG 24 hr tablet Take 10 mg by mouth every morning.    Yes Historical Provider, MD  senna-docusate (SENOKOT-S) 8.6-50 MG tablet Take 1 tablet by mouth 2 (two) times daily. Patient taking differently: Take 1 tablet by mouth 2 (two) times daily as needed.  01/26/16  Yes [place], DO  spironolactone (ALDACTONE) 50 MG tablet Take 50 mg by mouth daily. 10/26/15  Yes Historical Provider, MD  zolpidem (AMBIEN) 10 MG tablet Take 10 mg by mouth at bedtime.   Yes Historical Provider, MD  Nutritional Supplements (FEEDING SUPPLEMENT, NEPRO CARB STEADY,) LIQD Take 237 mLs by mouth 3 (three) times daily as needed (Supplement). 01/26/16   [place], DO  ondansetron (ZOFRAN ODT) 4 MG disintegrating tablet Take 1 tablet (4 mg total) by mouth every 8 (eight) hours as needed for nausea. 01/17/16   Noemi [place], MD  sulfamethoxazole-trimethoprim (BACTRIM DS,SEPTRA DS) 800-160 MG tablet Take 1 tablet by mouth 2 (two) times daily. 02/27/16   Irine Seal, MD     Family History  Problem Relation Age of Onset  . Family history unknown: Yes    Social History   Social History  . Marital status: Single    Spouse name: N/A  . Number of children: N/A  . Years of education: N/A   Social History Main Topics  . Smoking status: Never Smoker  . Smokeless tobacco: Never Used  . Alcohol use No  . Drug use: No  . Sexual activity: No   Other Topics Concern  . None   Social History Narrative  . None    Review of Systems  Constitutional: Negative for fatigue and fever.  Respiratory: Negative for cough and shortness of breath.   Cardiovascular: Negative for chest pain.  Psychiatric/Behavioral:  Negative for behavioral problems and confusion.    Vital Signs: BP (!) 135/46 (BP Location: Left Arm)   Pulse 71   Temp 97.5 F (36.4 C) (Oral)   Resp 20   Ht 4\' 9"  (1.448 m)   Wt 264 lb (119.7 kg)   SpO2 94%   BMI 57.13 kg/m   Physical Exam  Constitutional: She appears well-developed.  Cardiovascular: Normal rate, regular rhythm and normal heart sounds.   Pulmonary/Chest: Effort normal and breath sounds normal. No respiratory distress.  Neurological: She is alert.  Skin: Skin is warm and dry.  Nursing note and vitals reviewed.   Mallampati Score:  MD Evaluation Airway: WNL Heart: WNL Abdomen: WNL Chest/ Lungs: WNL ASA  Classification: 3 Mallampati/Airway Score: Two  Imaging: No results found.  Labs:  CBC:  Recent Labs  01/24/16 0500 01/25/16 0924 02/27/16 0835 04/10/16 0941  WBC 5.4 5.3 6.1 5.3  HGB 8.6* 9.4* 11.6* 14.0  HCT 26.1* 29.5* 36.0 42.4  PLT 195 195 197 206  COAGS:  Recent Labs  09/15/15 0615 09/26/15 1711 10/17/15 1321 04/10/16 0941  INR 1.03 1.30 1.28 0.94  APTT  --  38*  --   --     BMP:  Recent Labs  01/25/16 0436 01/26/16 0419 02/27/16 0835 04/10/16 0941  NA 142 142  143 141 140  K 3.7 3.5  3.6 4.7 4.8  CL 106 108  108 113* 106  CO2 29 28  29  20* 23  GLUCOSE 82 84  85 117* 128*  BUN 24* 18  19 38* 34*  CALCIUM 7.7* 7.8*  8.0* 9.6 10.0  CREATININE 1.70* 1.38*  1.46* 2.03* 1.80*  GFRNONAA 31* 40*  37* 25* 29*  GFRAA 36* 46*  43* 29* 33*    LIVER FUNCTION TESTS:  Recent Labs  09/26/15 1124 10/17/15 1227 01/17/16 1237 01/19/16 1312  01/23/16 0456 01/24/16 0500 01/25/16 0436 01/26/16 0419  BILITOT 0.6 0.6 0.4 0.3  --   --   --   --   --   AST 18 28 20 24   --   --   --   --   --   ALT 19 18 15 16   --   --   --   --   --   ALKPHOS 56 79 91 78  --   --   --   --   --   PROT 6.2* 8.2* 8.4* 7.9  --   --   --   --   --   ALBUMIN 2.7* 3.1* 3.6 3.3*  < > 2.0* 2.1* 2.2* 2.2*  < > = values in this  interval not displayed.  TUMOR MARKERS: No results for input(s): AFPTM, CEA, CA199, CHROMGRNA in the last 8760 hours.  Assessment and Plan: ARIYA BOHANNON is a 66 y.o. female with past medical history of mental retardation, DM, CKD, HTN, kidney stones who presents with complaint of frequent UTIs and pyelonephritis.   Patient presents for Port-A-Cath placement for IV therapies at the request of Dr. Jeffie Pollock.  She has been NPO.  She does not take blood thinners.  She has been in her usual state of health since last admission for UTI and stone extraction 02/26/16.  Risks and benefits discussed with the patient's guardian including, but not limited to bleeding, infection, pneumothorax, or fibrin sheath development and need for additional procedures. All of the patient's guardian's questions were answered, guardian is agreeable to proceed. Consent signed and in chart.   Thank you for this interesting consult.  I greatly enjoyed meeting TEANA LINDAHL and look forward to participating in their care.  A copy of this report was sent to the requesting provider on this date.  Electronically Signed: Docia Barrier 04/10/2016, 11:01 AM   I spent a total of  30 Minutes   in face to face in clinical consultation, greater than 50% of which was counseling/coordinating care for frequent UTI, poor venous access

## 2016-04-10 NOTE — Procedures (Signed)
Interventional Radiology Procedure Note  Procedure: Placement of a right IJ approach single lumen PowerPort.  Tip is positioned at the superior cavoatrial junction and catheter is ready for immediate use.  Complications: None Recommendations:  - Ok to shower tomorrow - Do not submerge for 7 days - Routine line care   Signed,  Zymire Turnbo S. Amori Cooperman, DO   

## 2016-04-10 NOTE — Sedation Documentation (Signed)
Patient is resting comfortably.  Tolerating well 

## 2016-04-10 NOTE — Sedation Documentation (Signed)
O2 d/c'd 

## 2016-04-10 NOTE — Sedation Documentation (Signed)
Patient is resting comfortably. 

## 2016-04-10 NOTE — Discharge Instructions (Signed)
Implanted Port Home Guide °An implanted port is a type of central line that is placed under the skin. Central lines are used to provide IV access when treatment or nutrition needs to be given through a person’s veins. Implanted ports are used for long-term IV access. An implanted port may be placed because: °· You need IV medicine that would be irritating to the small veins in your hands or arms. °· You need long-term IV medicines, such as antibiotics. °· You need IV nutrition for a long period. °· You need frequent blood draws for lab tests. °· You need dialysis. °Implanted ports are usually placed in the chest area, but they can also be placed in the upper arm, the abdomen, or the leg. An implanted port has two main parts: °· Reservoir. The reservoir is round and will appear as a small, raised area under your skin. The reservoir is the part where a needle is inserted to give medicines or draw blood. °· Catheter. The catheter is a thin, flexible tube that extends from the reservoir. The catheter is placed into a large vein. Medicine that is inserted into the reservoir goes into the catheter and then into the vein. °How will I care for my incision site? °Do not get the incision site wet. Bathe or shower as directed by your health care provider. °How is my port accessed? °Special steps must be taken to access the port: °· Before the port is accessed, a numbing cream can be placed on the skin. This helps numb the skin over the port site. °· Your health care provider uses a sterile technique to access the port. °¨ Your health care provider must put on a mask and sterile gloves. °¨ The skin over your port is cleaned carefully with an antiseptic and allowed to dry. °¨ The port is gently pinched between sterile gloves, and a needle is inserted into the port. °· Only "non-coring" port needles should be used to access the port. Once the port is accessed, a blood return should be checked. This helps ensure that the port is  in the vein and is not clogged. °· If your port needs to remain accessed for a constant infusion, a clear (transparent) bandage will be placed over the needle site. The bandage and needle will need to be changed every week, or as directed by your health care provider. °· Keep the bandage covering the needle clean and dry. Do not get it wet. Follow your health care provider’s instructions on how to take a shower or bath while the port is accessed. °· If your port does not need to stay accessed, no bandage is needed over the port. °What is flushing? °Flushing helps keep the port from getting clogged. Follow your health care provider’s instructions on how and when to flush the port. Ports are usually flushed with saline solution or a medicine called heparin. The need for flushing will depend on how the port is used. °· If the port is used for intermittent medicines or blood draws, the port will need to be flushed: °¨ After medicines have been given. °¨ After blood has been drawn. °¨ As part of routine maintenance. °· If a constant infusion is running, the port may not need to be flushed. °How long will my port stay implanted? °The port can stay in for as long as your health care provider thinks it is needed. When it is time for the port to come out, surgery will be done to remove it.   The procedure is similar to the one performed when the port was put in. °When should I seek immediate medical care? °When you have an implanted port, you should seek immediate medical care if: °· You notice a bad smell coming from the incision site. °· You have swelling, redness, or drainage at the incision site. °· You have more swelling or pain at the port site or the surrounding area. °· You have a fever that is not controlled with medicine. °This information is not intended to replace advice given to you by your health care provider. Make sure you discuss any questions you have with your health care provider. °Document Released:  12/24/2004 Document Revised: 06/01/2015 Document Reviewed: 08/31/2012 °Elsevier Interactive Patient Education © 2017 [employer] °Moderate Conscious Sedation, Adult, Care After °These instructions provide you with information about caring for yourself after your procedure. Your health care provider may also give you more specific instructions. Your treatment has been planned according to current medical practices, but problems sometimes occur. Call your health care provider if you have any problems or questions after your procedure. °What can I expect after the procedure? °After your procedure, it is common: °· To feel sleepy for several hours. °· To feel clumsy and have poor balance for several hours. °· To have poor judgment for several hours. °· To vomit if you eat too soon. °Follow these instructions at home: °For at least 24 hours after the procedure:  ° °· Do not: °¨ Participate in activities where you could fall or become injured. °¨ Drive. °¨ Use heavy machinery. °¨ Drink alcohol. °¨ Take sleeping pills or medicines that cause drowsiness. °¨ Make important decisions or sign legal documents. °¨ Take care of children on your own. °· Rest. °Eating and drinking  °· Follow the diet recommended by your health care provider. °· If you vomit: °¨ Drink water, juice, or soup when you can drink without vomiting. °¨ Make sure you have little or no nausea before eating solid foods. °General instructions  °· Have a responsible adult stay with you until you are awake and alert. °· Take over-the-counter and prescription medicines only as told by your health care provider. °· If you smoke, do not smoke without supervision. °· Keep all follow-up visits as told by your health care provider. This is important. °Contact a health care provider if: °· You keep feeling nauseous or you keep vomiting. °· You feel light-headed. °· You develop a rash. °· You have a fever. °Get help right away if: °· You have trouble breathing. °This  information is not intended to replace advice given to you by your health care provider. Make sure you discuss any questions you have with your health care provider. °Document Released: 10/14/2012 Document Revised: 05/29/2015 Document Reviewed: 04/15/2015 °Elsevier Interactive Patient Education © 2017 [employer] ° °

## 2016-05-31 ENCOUNTER — Other Ambulatory Visit: Payer: Self-pay | Admitting: Urology

## 2016-05-31 DIAGNOSIS — N200 Calculus of kidney: Secondary | ICD-10-CM

## 2016-06-10 NOTE — Addendum Note (Signed)
Addendum  created 06/10/16 1123 by Myrtie Soman, MD   Sign clinical note

## 2016-06-11 ENCOUNTER — Ambulatory Visit (HOSPITAL_COMMUNITY)
Admission: RE | Admit: 2016-06-11 | Discharge: 2016-06-11 | Disposition: A | Discharge status: Home / Self Care | Payer: Medicare Other | Source: Ambulatory Visit | Attending: Urology | Admitting: Urology

## 2016-06-14 ENCOUNTER — Ambulatory Visit (HOSPITAL_COMMUNITY)
Admission: RE | Admit: 2016-06-14 | Discharge: 2016-06-14 | Disposition: A | Discharge status: Home / Self Care | Payer: Medicare Other | Source: Ambulatory Visit | Attending: Urology | Admitting: Urology

## 2016-06-14 ENCOUNTER — Encounter (HOSPITAL_COMMUNITY): Payer: Self-pay

## 2016-06-20 ENCOUNTER — Ambulatory Visit (HOSPITAL_COMMUNITY): Payer: Medicare Other

## 2016-06-21 ENCOUNTER — Ambulatory Visit (HOSPITAL_COMMUNITY)
Admission: RE | Admit: 2016-06-21 | Discharge: 2016-06-21 | Disposition: A | Discharge status: Home / Self Care | Payer: Medicare Other | Source: Ambulatory Visit | Attending: Urology | Admitting: Urology

## 2016-06-21 DIAGNOSIS — N200 Calculus of kidney: Secondary | ICD-10-CM | POA: Diagnosis present

## 2017-04-28 ENCOUNTER — Encounter (HOSPITAL_COMMUNITY)
Admission: RE | Admit: 2017-04-28 | Discharge: 2017-04-28 | Disposition: A | Discharge status: Home / Self Care | Payer: Medicare Other | Source: Ambulatory Visit | Attending: Family | Admitting: Family

## 2017-04-28 ENCOUNTER — Encounter (HOSPITAL_COMMUNITY): Payer: Self-pay

## 2017-04-28 DIAGNOSIS — F72 Severe intellectual disabilities: Secondary | ICD-10-CM | POA: Insufficient documentation

## 2017-04-28 DIAGNOSIS — Z95828 Presence of other vascular implants and grafts: Secondary | ICD-10-CM | POA: Diagnosis present

## 2017-04-28 DIAGNOSIS — Z452 Encounter for adjustment and management of vascular access device: Secondary | ICD-10-CM | POA: Diagnosis present

## 2017-04-28 MED ORDER — HEPARIN SOD (PORK) LOCK FLUSH 100 UNIT/ML IV SOLN
INTRAVENOUS | Status: AC
  Filled 2017-04-28: qty 5

## 2017-04-28 MED ORDER — HEPARIN SOD (PORK) LOCK FLUSH 100 UNIT/ML IV SOLN
500.0000 [IU] | Freq: Once | INTRAVENOUS | Status: AC
  Administered 2017-04-28: 500 [IU] via INTRAVENOUS

## 2017-04-28 MED ORDER — SODIUM CHLORIDE 0.9% FLUSH: 10.0000 mL | Freq: Once | INTRAVENOUS | Status: DC

## 2017-04-28 NOTE — Discharge Instructions (Signed)
PICC Home Guide °A peripherally inserted central catheter (PICC) is a long, thin, flexible tube that is inserted into a vein in the upper arm. It is a form of intravenous (IV) access. It is considered to be a "central" line because the tip of the PICC ends in a large vein in your chest. This large vein is called the superior vena cava (SVC). The PICC tip ends in the SVC because there is a lot of blood flow in the SVC. This allows medicines and IV fluids to be quickly distributed throughout the body. The PICC is inserted using a sterile technique by a specially trained nurse or physician. After the PICC is inserted, a chest X-ray exam is done to be sure it is in the correct place. °A PICC may be placed for different reasons, such as: °· To give medicines and liquid nutrition that can only be given through a central line. Examples are: °? Certain antibiotic treatments. °? Chemotherapy. °? Total parenteral nutrition (TPN). °· To take frequent blood samples. °· To give IV fluids and blood products. °· If there is difficulty placing a peripheral intravenous (PIV) catheter. ° °If taken care of properly, a PICC can remain in place for several months. A PICC can also allow a person to go home from the hospital early. Medicine and PICC care can be managed at home by a family member or home health care team. °What problems can happen when I have a PICC? °Problems with a PICC can occasionally occur. These may include the following: °· A blood clot (thrombus) forming in or at the tip of the PICC. This can cause the PICC to become clogged. A clot-dissolving medicine called tissue plasminogen activator (tPA) can be given through the PICC to help break up the clot. °· Inflammation of the vein (phlebitis) in which the PICC is placed. Signs of inflammation may include redness, pain at the insertion site, red streaks, or being able to feel a "cord" in the vein where the PICC is located. °· Infection in the PICC or at the insertion  site. Signs of infection may include fever, chills, redness, swelling, or pus drainage from the PICC insertion site. °· PICC movement (malposition). The PICC tip may move from its original position due to excessive physical activity, forceful coughing, sneezing, or vomiting. °· A break or cut in the PICC. It is important to not use scissors near the PICC. °· Nerve or tendon irritation or injury during PICC insertion. ° °What should I keep in mind about activities when I have a PICC? °· You may bend your arm and move it freely. If your PICC is near or at the bend of your elbow, avoid activity with repeated motion at the elbow. °· Rest at home for the remainder of the day following PICC line insertion. °· Avoid lifting heavy objects as instructed by your health care provider. °· Avoid using a crutch with the arm on the same side as your PICC. You may need to use a walker. °What should I know about my PICC dressing? °· Keep your PICC bandage (dressing) clean and dry to prevent infection. °? Ask your health care provider when you may shower. Ask your health care provider to teach you how to wrap the PICC when you do take a shower. °· Change the PICC dressing as instructed by your health care provider. °· Change your PICC dressing if it becomes loose or wet. °What should I know about PICC care? °· Check the PICC insertion   site daily for leakage, redness, swelling, or pain. °· Do not take a bath, swim, or use hot tubs when you have a PICC. Cover PICC line with clear plastic wrap and tape to keep it dry while showering. °· Flush the PICC as directed by your health care provider. Let your health care provider know right away if the PICC is difficult to flush or does not flush. Do not use force to flush the PICC. °· Do not use a syringe that is less than 10 mL to flush the PICC. °· Never pull or tug on the PICC. °· Avoid blood pressure checks on the arm with the PICC. °· Keep your PICC identification card with you at all  times. °· Do not take the PICC out yourself. Only a trained clinical professional should remove the PICC. °Get help right away if: °· Your PICC is accidentally pulled all the way out. If this happens, cover the insertion site with a bandage or gauze dressing. Do not throw the PICC away. Your health care provider will need to inspect it. °· Your PICC was tugged or pulled and has partially come out. Do not  push the PICC back in. °· There is any type of drainage, redness, or swelling where the PICC enters the skin. °· You cannot flush the PICC, it is difficult to flush, or the PICC leaks around the insertion site when it is flushed. °· You hear a "flushing" sound when the PICC is flushed. °· You have pain, discomfort, or numbness in your arm, shoulder, or jaw on the same side as the PICC. °· You feel your heart "racing" or skipping beats. °· You notice a hole or tear in the PICC. °· You develop chills or a fever. °This information is not intended to replace advice given to you by your health care provider. Make sure you discuss any questions you have with your health care provider. °Document Released: 06/30/2002 Document Revised: 07/14/2015 Document Reviewed: 10/16/2012 °Elsevier Interactive Patient Education © 2017 [employer] Inc. ° °

## 2017-04-28 NOTE — Progress Notes (Signed)
Port accessed with no difficulty and was flushed with heparin. Patient had good blood return.

## 2018-04-20 ENCOUNTER — Encounter (HOSPITAL_COMMUNITY): Payer: Self-pay

## 2018-04-20 ENCOUNTER — Inpatient Hospital Stay (HOSPITAL_COMMUNITY)
Admission: EM | Admit: 2018-04-20 | Discharge: 2018-04-25 | DRG: 659 | Disposition: A | Discharge status: Home / Self Care | Payer: Medicare Other | Attending: Internal Medicine | Admitting: Internal Medicine

## 2018-04-20 ENCOUNTER — Emergency Department (HOSPITAL_COMMUNITY): Payer: Medicare Other

## 2018-04-20 ENCOUNTER — Other Ambulatory Visit: Payer: Self-pay

## 2018-04-20 DIAGNOSIS — I129 Hypertensive chronic kidney disease with stage 1 through stage 4 chronic kidney disease, or unspecified chronic kidney disease: Secondary | ICD-10-CM | POA: Diagnosis present

## 2018-04-20 DIAGNOSIS — A419 Sepsis, unspecified organism: Secondary | ICD-10-CM | POA: Diagnosis not present

## 2018-04-20 DIAGNOSIS — R319 Hematuria, unspecified: Secondary | ICD-10-CM | POA: Diagnosis not present

## 2018-04-20 DIAGNOSIS — Z7982 Long term (current) use of aspirin: Secondary | ICD-10-CM | POA: Diagnosis not present

## 2018-04-20 DIAGNOSIS — E1122 Type 2 diabetes mellitus with diabetic chronic kidney disease: Secondary | ICD-10-CM | POA: Diagnosis present

## 2018-04-20 DIAGNOSIS — E872 Acidosis: Secondary | ICD-10-CM | POA: Diagnosis present

## 2018-04-20 DIAGNOSIS — F79 Unspecified intellectual disabilities: Secondary | ICD-10-CM | POA: Diagnosis present

## 2018-04-20 DIAGNOSIS — I10 Essential (primary) hypertension: Secondary | ICD-10-CM | POA: Diagnosis not present

## 2018-04-20 DIAGNOSIS — G809 Cerebral palsy, unspecified: Secondary | ICD-10-CM | POA: Diagnosis present

## 2018-04-20 DIAGNOSIS — Z888 Allergy status to other drugs, medicaments and biological substances status: Secondary | ICD-10-CM

## 2018-04-20 DIAGNOSIS — E7800 Pure hypercholesterolemia, unspecified: Secondary | ICD-10-CM | POA: Diagnosis present

## 2018-04-20 DIAGNOSIS — E875 Hyperkalemia: Secondary | ICD-10-CM | POA: Diagnosis present

## 2018-04-20 DIAGNOSIS — N390 Urinary tract infection, site not specified: Secondary | ICD-10-CM | POA: Diagnosis present

## 2018-04-20 DIAGNOSIS — Z6841 Body Mass Index (BMI) 40.0 and over, adult: Secondary | ICD-10-CM

## 2018-04-20 DIAGNOSIS — B952 Enterococcus as the cause of diseases classified elsewhere: Secondary | ICD-10-CM | POA: Diagnosis present

## 2018-04-20 DIAGNOSIS — Z960 Presence of urogenital implants: Secondary | ICD-10-CM | POA: Diagnosis present

## 2018-04-20 DIAGNOSIS — N179 Acute kidney failure, unspecified: Secondary | ICD-10-CM | POA: Diagnosis present

## 2018-04-20 DIAGNOSIS — N132 Hydronephrosis with renal and ureteral calculous obstruction: Secondary | ICD-10-CM | POA: Diagnosis present

## 2018-04-20 DIAGNOSIS — Z880 Allergy status to penicillin: Secondary | ICD-10-CM | POA: Diagnosis not present

## 2018-04-20 DIAGNOSIS — N136 Pyonephrosis: Principal | ICD-10-CM | POA: Diagnosis present

## 2018-04-20 DIAGNOSIS — Z79890 Hormone replacement therapy (postmenopausal): Secondary | ICD-10-CM | POA: Diagnosis not present

## 2018-04-20 DIAGNOSIS — G9341 Metabolic encephalopathy: Secondary | ICD-10-CM | POA: Diagnosis present

## 2018-04-20 DIAGNOSIS — G808 Other cerebral palsy: Secondary | ICD-10-CM | POA: Diagnosis not present

## 2018-04-20 DIAGNOSIS — F419 Anxiety disorder, unspecified: Secondary | ICD-10-CM | POA: Diagnosis present

## 2018-04-20 DIAGNOSIS — N183 Chronic kidney disease, stage 3 (moderate): Secondary | ICD-10-CM | POA: Diagnosis present

## 2018-04-20 DIAGNOSIS — E039 Hypothyroidism, unspecified: Secondary | ICD-10-CM | POA: Diagnosis present

## 2018-04-20 DIAGNOSIS — N320 Bladder-neck obstruction: Secondary | ICD-10-CM | POA: Diagnosis present

## 2018-04-20 DIAGNOSIS — N201 Calculus of ureter: Secondary | ICD-10-CM

## 2018-04-20 DIAGNOSIS — K219 Gastro-esophageal reflux disease without esophagitis: Secondary | ICD-10-CM | POA: Diagnosis present

## 2018-04-20 DIAGNOSIS — D649 Anemia, unspecified: Secondary | ICD-10-CM | POA: Diagnosis present

## 2018-04-20 DIAGNOSIS — F819 Developmental disorder of scholastic skills, unspecified: Secondary | ICD-10-CM | POA: Diagnosis present

## 2018-04-20 DIAGNOSIS — Z79899 Other long term (current) drug therapy: Secondary | ICD-10-CM

## 2018-04-20 DIAGNOSIS — F329 Major depressive disorder, single episode, unspecified: Secondary | ICD-10-CM | POA: Diagnosis present

## 2018-04-20 LAB — CBC WITH DIFFERENTIAL/PLATELET
Abs Immature Granulocytes: 0.04 10*3/uL (ref 0.00–0.07)
Basophils Absolute: 0.1 10*3/uL (ref 0.0–0.1)
Basophils Relative: 1 %
Eosinophils Absolute: 0 10*3/uL (ref 0.0–0.5)
Eosinophils Relative: 0 %
HCT: 36.7 % (ref 36.0–46.0)
Hemoglobin: 11.5 g/dL — ABNORMAL LOW (ref 12.0–15.0)
Immature Granulocytes: 0 %
Lymphocytes Relative: 15 %
Lymphs Abs: 1.4 10*3/uL (ref 0.7–4.0)
MCH: 29.8 pg (ref 26.0–34.0)
MCHC: 31.3 g/dL (ref 30.0–36.0)
MCV: 95.1 fL (ref 80.0–100.0)
Monocytes Absolute: 1.1 10*3/uL — ABNORMAL HIGH (ref 0.1–1.0)
Monocytes Relative: 12 %
Neutro Abs: 6.4 10*3/uL (ref 1.7–7.7)
Neutrophils Relative %: 72 %
Platelets: 168 10*3/uL (ref 150–400)
RBC: 3.86 MIL/uL — ABNORMAL LOW (ref 3.87–5.11)
RDW: 13.6 % (ref 11.5–15.5)
WBC: 8.9 10*3/uL (ref 4.0–10.5)
nRBC: 0 % (ref 0.0–0.2)

## 2018-04-20 LAB — PROTIME-INR
INR: 1.1 (ref 0.8–1.2)
Prothrombin Time: 13.8 seconds (ref 11.4–15.2)

## 2018-04-20 LAB — URINALYSIS, ROUTINE W REFLEX MICROSCOPIC
Bilirubin Urine: NEGATIVE
Glucose, UA: NEGATIVE mg/dL
Ketones, ur: NEGATIVE mg/dL
Nitrite: NEGATIVE
Protein, ur: 100 mg/dL — AB
RBC / HPF: >50 RBC/hpf — ABNORMAL HIGH (ref 0–5)
Specific Gravity, Urine: 1.021 (ref 1.005–1.030)
Squamous Epithelial / HPF: >50 — ABNORMAL HIGH (ref 0–5)
WBC, UA: >50 WBC/hpf — ABNORMAL HIGH (ref 0–5)
pH: 5 (ref 5.0–8.0)

## 2018-04-20 LAB — COMPREHENSIVE METABOLIC PANEL
ALT: 10 U/L (ref 0–44)
AST: 11 U/L — ABNORMAL LOW (ref 15–41)
Albumin: 3.5 g/dL (ref 3.5–5.0)
Alkaline Phosphatase: 75 U/L (ref 38–126)
Anion gap: 10 (ref 5–15)
BUN: 55 mg/dL — ABNORMAL HIGH (ref 8–23)
CO2: 20 mmol/L — ABNORMAL LOW (ref 22–32)
Calcium: 8.6 mg/dL — ABNORMAL LOW (ref 8.9–10.3)
Chloride: 110 mmol/L (ref 98–111)
Creatinine, Ser: 4.83 mg/dL — ABNORMAL HIGH (ref 0.44–1.00)
GFR calc Af Amer: 10 mL/min — ABNORMAL LOW (ref >60)
GFR calc non Af Amer: 9 mL/min — ABNORMAL LOW (ref >60)
Glucose, Bld: 128 mg/dL — ABNORMAL HIGH (ref 70–99)
Potassium: 4.9 mmol/L (ref 3.5–5.1)
Sodium: 140 mmol/L (ref 135–145)
Total Bilirubin: 0.8 mg/dL (ref 0.3–1.2)
Total Protein: 6.5 g/dL (ref 6.5–8.1)

## 2018-04-20 LAB — TROPONIN I: Troponin I: <0.03 ng/mL (ref <0.03)

## 2018-04-20 LAB — LACTIC ACID, PLASMA
Lactic Acid, Venous: 0.9 mmol/L (ref 0.5–1.9)
Lactic Acid, Venous: 0.8 mmol/L (ref 0.5–1.9)

## 2018-04-20 LAB — MAGNESIUM: Magnesium: 1.4 mg/dL — ABNORMAL LOW (ref 1.7–2.4)

## 2018-04-20 MED ORDER — ASPIRIN EC 81 MG PO TBEC: 81.0000 mg | DELAYED_RELEASE_TABLET | Freq: Every morning | ORAL | Status: DC

## 2018-04-20 MED ORDER — ACETAMINOPHEN 325 MG PO TABS
650.0000 mg | ORAL_TABLET | Freq: Four times a day (QID) | ORAL | Status: DC | PRN
  Administered 2018-04-23 – 2018-04-24 (×2): 650 mg via ORAL
  Filled 2018-04-20 (×2): qty 2

## 2018-04-20 MED ORDER — OXYBUTYNIN CHLORIDE ER 5 MG PO TB24
10.0000 mg | ORAL_TABLET | Freq: Every morning | ORAL | Status: DC
  Filled 2018-04-20 (×2): qty 2

## 2018-04-20 MED ORDER — ACETAMINOPHEN 650 MG RE SUPP: 650.0000 mg | Freq: Four times a day (QID) | RECTAL | Status: DC | PRN

## 2018-04-20 MED ORDER — LEVOTHYROXINE SODIUM 50 MCG PO TABS
50.0000 ug | ORAL_TABLET | Freq: Every day | ORAL | Status: DC
  Administered 2018-04-21 – 2018-04-25 (×5): 50 ug via ORAL
  Filled 2018-04-20 (×5): qty 1

## 2018-04-20 MED ORDER — SODIUM CHLORIDE 0.9 % IV SOLN
INTRAVENOUS | Status: DC
  Administered 2018-04-20: 23:00:00 via INTRAVENOUS
  Administered 2018-04-20: 100 mL/h via INTRAVENOUS
  Administered 2018-04-21: 09:00:00 via INTRAVENOUS

## 2018-04-20 MED ORDER — SODIUM CHLORIDE 0.9 % IV BOLUS
500.0000 mL | Freq: Once | INTRAVENOUS | Status: AC
  Administered 2018-04-20: 16:00:00 500 mL via INTRAVENOUS

## 2018-04-20 MED ORDER — SODIUM CHLORIDE 0.9 % IV SOLN
1.0000 g | Freq: Once | INTRAVENOUS | Status: AC
  Administered 2018-04-20: 17:00:00 1 g via INTRAVENOUS
  Filled 2018-04-20: qty 10

## 2018-04-20 MED ORDER — SODIUM CHLORIDE 0.9 % IV SOLN: 1.0000 g | INTRAVENOUS | Status: DC

## 2018-04-20 MED ORDER — MAGNESIUM SULFATE 2 GM/50ML IV SOLN
2.0000 g | Freq: Once | INTRAVENOUS | Status: AC
  Administered 2018-04-20: 2 g via INTRAVENOUS
  Filled 2018-04-20: qty 50

## 2018-04-20 MED ORDER — HEPARIN SODIUM (PORCINE) 5000 UNIT/ML IJ SOLN
5000.0000 [IU] | Freq: Three times a day (TID) | INTRAMUSCULAR | Status: DC
  Administered 2018-04-20 – 2018-04-25 (×15): 5000 [IU] via SUBCUTANEOUS
  Filled 2018-04-20 (×15): qty 1

## 2018-04-20 NOTE — ED Notes (Signed)
Family members has made multiple calls to ED wanting to stay with pt. Explained situation. Pt is calming, we have had no issues in ED. Multiple people checking on pt. Family advised RN will assess with have teleassit if necessary. Family is continuing to call and make threats if something happens to her. Advised family we are taking care of her and due to pandemic we have policies to follow.

## 2018-04-20 NOTE — ED Triage Notes (Addendum)
Pt brought in by EMS. Sister reports one episode of vomiting on Friday No diarrhea. Not eating well and sleeping more. Urine foul smelling per sister.

## 2018-04-20 NOTE — ED Provider Notes (Signed)
[facility] EMERGENCY DEPARTMENT Provider Note   CSN: [id number] Arrival date & time: 04/20/18  1332    History   Chief Complaint Chief Complaint  Patient presents with  . Emesis    HPI Rachel Morgan is a 68 y.o. female.     The history is provided by the EMS personnel and a relative. The history is limited by a developmental delay and the condition of the patient (Hx MR, AMS).  Emesis    Pt was seen at 1345.  Per EMS and pt's sister's report: Pt has "not been acting right" for the past 1 week. Pt's sister described this as "sleeping more, not eating well." Pt also has had "foul smelling urine." Pt also has had one episode of N/V 3 days ago. No reported cough, fever, COVID+ contacts. Pt herself has hx of MR and cannot give further hx.   Past Medical History:  Diagnosis Date  . Anxiety   . Calculus of gallbladder   . Cerebral palsy ([place])   . Chronic kidney disease   . Diabetes mellitus   . Gall stones   . GERD (gastroesophageal reflux disease)   . High cholesterol   . History of kidney stones   . Hypertension   . Hypothyroidism   . Kidney stones   . MR (mental retardation)   . Pyelonephritis   . Sepsis [facility])     Patient Active Problem List   Diagnosis Date Noted  . PICC (peripherally inserted central catheter) in place   . Urinary tract infection without hematuria   . Hydronephrosis with renal and ureteral calculus obstruction 01/21/2016  . Hyperkalemia 01/21/2016  . Palliative care encounter   . DNR (do not resuscitate) discussion   . Encephalopathy 10/17/2015  . Goals of care, counseling/discussion 10/17/2015  . Nephrolithiasis 09/27/2015  . Cerebral palsy ([place]) 09/27/2015  . Sepsis ([place]) 09/26/2015  . AKI (acute kidney injury) ([place]) 09/26/2015  . Fever   . Urinary tract infectious disease   . Dilated cbd, acquired 09/14/2015  . Hypothyroidism 09/14/2015  . Hypertension 09/14/2015  . Depression with anxiety 09/14/2015  . UPJ obstruction, acquired  09/14/2015  . CKD (chronic kidney disease), stage III ([place]) 09/14/2015  . GERD (gastroesophageal reflux disease) 09/14/2015  . Calculus of gallbladder without cholecystitis without obstruction   . Abdominal pain, right upper quadrant 02/11/2011  . Acute pyelonephritis 02/11/2011  . Agoraphobia 02/11/2011  . Acute renal failure ([place]) 02/11/2011  . Hyponatremia 02/11/2011  . Dehydration 02/11/2011    Past Surgical History:  Procedure Laterality Date  . CYSTOSCOPY W/ URETERAL STENT PLACEMENT Bilateral 09/26/2015   Procedure: CYSTOSCOPY WITH Bilateral  RETROGRADE PYELOGRAM/URETERAL STENT PLACEMENT;  Surgeon: [name], MD;  Location: [facility];  Service: Urology;  Laterality: Bilateral;  . CYSTOSCOPY W/ URETERAL STENT REMOVAL Left 11/10/2015   Procedure: CYSTOSCOPY WITH STENT REMOVAL;  Surgeon: Cleon Gustin, MD;  Location: [facility];  Service: Urology;  Laterality: Left;  . CYSTOSCOPY WITH STENT PLACEMENT Right 01/21/2016   Procedure: CYSTOSCOPY WITH STENT PLACEMENT;  Surgeon: Franchot Gallo, MD;  Location: [facility];  Service: Urology;  Laterality: Right;  . CYSTOSCOPY/RETROGRADE/URETEROSCOPY/STONE EXTRACTION WITH BASKET Left 10/31/2015   Procedure: CYSTOSCOPY/ BILATERAL URETEROSCOPY/BILATERAL STONE EXTRACTION/ LEFT STENT PLACEMENT;  Surgeon: Irine Seal, MD;  Location: [facility];  Service: Urology;  Laterality: Left;  . CYSTOSCOPY/URETEROSCOPY/HOLMIUM LASER/STENT PLACEMENT Right 02/27/2016   Procedure: CYSTOSCOPY/RIGHT URETEROSCOPY/HOLMIUM LASER/STENT PLACEMENT/STENT REMOVAL;  Surgeon: Irine Seal, MD;  Location: [facility];  Service: Urology;  Laterality: Right;  .  HOLMIUM LASER APPLICATION N/A 27/03/5007   Procedure: HOLMIUM LASER APPLICATION;  Surgeon: Irine Seal, MD;  Location: [facility];  Service: Urology;  Laterality: N/A;  . IR FLUORO GUIDE PORT INSERTION RIGHT  04/10/2016  . IR US GUIDE VASC ACCESS RIGHT  04/10/2016     OB History   No obstetric history on file.      Home Medications     Prior to Admission medications   Medication Sig Start Date End Date Taking? Authorizing Provider  acetaminophen (TYLENOL) 500 MG tablet Take 500-1,000 mg by mouth every 6 (six) hours as needed (for pain.).    [provider]  ALPRAZolam Duanne Moron) 1 MG tablet Take 1 mg by mouth 2 (two) times daily.     [provider]  aspirin EC 81 MG tablet Take 81 mg by mouth every morning.     [provider]  busPIRone (BUSPAR) 10 MG tablet Take 10 mg by mouth every morning.     [provider]  camphor-menthol Timoteo Ace) lotion Apply 1 application topically every 8 (eight) hours as needed for itching. 01/26/16   Raiford Noble Latif, DO  chlorproMAZINE (THORAZINE) 25 MG tablet Take 25 mg by mouth at bedtime.     [provider]  docusate sodium (ENEMEEZ) 283 MG enema Place 1 enema (283 mg total) rectally as needed for severe constipation. 01/26/16   Sheikh, Omair Latif, DO  escitalopram (LEXAPRO) 20 MG tablet Take 20 mg by mouth at bedtime.    [provider]  hydrOXYzine (ATARAX/VISTARIL) 25 MG tablet Take 1 tablet (25 mg total) by mouth every 8 (eight) hours as needed for itching. 01/26/16   Raiford Noble Latif, DO  iron polysaccharides (NIFEREX) 150 MG capsule Take 1 capsule (150 mg total) by mouth daily. 01/27/16   Raiford Noble Latif, DO  levothyroxine (SYNTHROID, LEVOTHROID) 50 MCG tablet Take 50 mcg by mouth daily before breakfast.    [provider]  Nutritional Supplements (FEEDING SUPPLEMENT, NEPRO CARB STEADY,) LIQD Take 237 mLs by mouth 3 (three) times daily as needed (Supplement). 01/26/16   Sheikh, Omair Latif, DO  OLANZapine (ZYPREXA) 20 MG tablet Take 20 mg by mouth daily.  07/24/15   [provider]  omeprazole (PRILOSEC) 20 MG capsule Take 20 mg by mouth daily before breakfast.     [provider]  ondansetron (ZOFRAN ODT) 4 MG disintegrating tablet Take 1 tablet (4 mg total) by mouth every 8 (eight) hours as needed for  nausea. 01/17/16   Noemi Chapel, MD  oxybutynin (DITROPAN-XL) 10 MG 24 hr tablet Take 10 mg by mouth every morning.     [provider]  senna-docusate (SENOKOT-S) 8.6-50 MG tablet Take 1 tablet by mouth 2 (two) times daily. Patient taking differently: Take 1 tablet by mouth 2 (two) times daily as needed.  01/26/16   Raiford Noble Latif, DO  spironolactone (ALDACTONE) 50 MG tablet Take 50 mg by mouth daily. 10/26/15   [provider]  sulfamethoxazole-trimethoprim (BACTRIM DS,SEPTRA DS) 800-160 MG tablet Take 1 tablet by mouth 2 (two) times daily. 02/27/16   Irine Seal, MD  zolpidem (AMBIEN) 10 MG tablet Take 10 mg by mouth at bedtime.    [provider]    Family History Family History  Family history unknown: Yes    Social History Social History   Tobacco Use  . Smoking status: Never Smoker  . Smokeless tobacco: Never Used  Substance Use Topics  . Alcohol use: No  . Drug use: No  Allergies   Macrobid [nitrofurantoin monohyd macro] and Penicillins   Review of Systems Review of Systems  Unable to perform ROS: Mental status change  Gastrointestinal: Positive for vomiting.     Physical Exam Updated Vital Signs BP (!) 125/55 (BP Location: Right Arm)   Pulse 77   Temp 98.6 F (37 C) (Oral)   Resp (!) 22   SpO2 91%    Patient Vitals for the past 24 hrs:  BP Temp Temp src Pulse Resp SpO2  04/20/18 1530 (!) 103/14 - - 64 - 93 %  04/20/18 1500 118/76 - - 66 - 94 %  04/20/18 1430 120/60 - - 71 - 93 %  04/20/18 1400 (!) 129/53 - - 78 - 95 %  04/20/18 1336 (!) 125/55 98.6 F (37 C) Oral 77 (!) 22 91 %     Physical Exam 1350: Physical examination:  Nursing notes reviewed; Vital signs and O2 SAT reviewed;  Constitutional: Well developed, Well nourished, In no acute distress; Head:  Normocephalic, atraumatic; Eyes: EOMI, PERRL, No scleral icterus; ENMT: Mouth and pharynx normal, Mucous membranes dry; Neck: Supple, Full range of motion, No  lymphadenopathy; Cardiovascular: Regular rate and rhythm, No gallop; Respiratory: Breath sounds clear & equal bilaterally, No wheezes. Normal respiratory effort/excursion; Chest: Nontender, Movement normal; Abdomen: Soft, Nontender, Nondistended, Normal bowel sounds; Genitourinary: No CVA tenderness; Extremities: Peripheral pulses normal, No tenderness, No edema, No calf edema or asymmetry.; Neuro: Awake, alert. Limited speech. No facial droop. Moves extremities on stretcher spontaneously..; Skin: Color normal, Warm, Dry.     ED Treatments / Results  Labs (all labs ordered are listed, but only abnormal results are displayed)   EKG EKG Interpretation  Date/Time:  Monday April 20 2018 13:33:56 EDT Ventricular Rate:  77 PR Interval:    QRS Duration: 66 QT Interval:  495 QTC Calculation: 561 R Axis:   6 Text Interpretation:  Sinus rhythm Low voltage, precordial leads Prolonged QT interval Artifact When compared with ECG of 01/21/2016 QT has lengthened Confirmed by Francine Graven [phone]) on 04/20/2018 1:56:45 PM   Radiology   Procedures Procedures (including critical care time)  Medications Ordered in ED Medications - No data to display   Initial Impression / Assessment and Plan / ED Course  I have reviewed the triage vital signs and the nursing notes.  Pertinent labs & imaging results that were available during my care of the patient were reviewed by me and considered in my medical decision making (see chart for details).     MDM Reviewed: previous chart, nursing note and vitals Reviewed previous: labs and ECG Interpretation: labs, ECG, x-ray and CT scan    Results for orders placed or performed during the hospital encounter of 04/20/18  Urinalysis, Routine w reflex microscopic  Result Value Ref Range   Color, Urine YELLOW YELLOW   APPearance TURBID (A) CLEAR   Specific Gravity, Urine 1.021 1.005 - 1.030   pH 5.0 5.0 - 8.0   Glucose, UA NEGATIVE NEGATIVE mg/dL    Hgb urine dipstick LARGE (A) NEGATIVE   Bilirubin Urine NEGATIVE NEGATIVE   Ketones, ur NEGATIVE NEGATIVE mg/dL   Protein, ur 100 (A) NEGATIVE mg/dL   Nitrite NEGATIVE NEGATIVE   Leukocytes,Ua MODERATE (A) NEGATIVE   RBC / HPF >50 (H) 0 - 5 RBC/hpf   WBC, UA >50 (H) 0 - 5 WBC/hpf   Bacteria, UA FEW (A) NONE SEEN   Squamous Epithelial / LPF >50 (H) 0 - 5   WBC Clumps PRESENT  Non Squamous Epithelial 0-5 (A) NONE SEEN  Comprehensive metabolic panel  Result Value Ref Range   Sodium 140 135 - 145 mmol/L   Potassium 4.9 3.5 - 5.1 mmol/L   Chloride 110 98 - 111 mmol/L   CO2 20 (L) 22 - 32 mmol/L   Glucose, Bld 128 (H) 70 - 99 mg/dL   BUN 55 (H) 8 - 23 mg/dL   Creatinine, Ser 4.83 (H) 0.44 - 1.00 mg/dL   Calcium 8.6 (L) 8.9 - 10.3 mg/dL   Total Protein 6.5 6.5 - 8.1 g/dL   Albumin 3.5 3.5 - 5.0 g/dL   AST 11 (L) 15 - 41 U/L   ALT 10 0 - 44 U/L   Alkaline Phosphatase 75 38 - 126 U/L   Total Bilirubin 0.8 0.3 - 1.2 mg/dL   GFR calc non Af Amer 9 (L) >60 mL/min   GFR calc Af Amer 10 (L) >60 mL/min   Anion gap 10 5 - 15  Troponin I - Once  Result Value Ref Range   Troponin I <0.03 <0.03 ng/mL  Lactic acid, plasma  Result Value Ref Range   Lactic Acid, Venous 0.9 0.5 - 1.9 mmol/L  CBC with Differential  Result Value Ref Range   WBC 8.9 4.0 - 10.5 K/uL   RBC 3.86 (L) 3.87 - 5.11 MIL/uL   Hemoglobin 11.5 (L) 12.0 - 15.0 g/dL   HCT 36.7 36.0 - 46.0 %   MCV 95.1 80.0 - 100.0 fL   MCH 29.8 26.0 - 34.0 pg   MCHC 31.3 30.0 - 36.0 g/dL   RDW 13.6 11.5 - 15.5 %   Platelets 168 150 - 400 K/uL   nRBC 0.0 0.0 - 0.2 %   Neutrophils Relative % 72 %   Neutro Abs 6.4 1.7 - 7.7 K/uL   Lymphocytes Relative 15 %   Lymphs Abs 1.4 0.7 - 4.0 K/uL   Monocytes Relative 12 %   Monocytes Absolute 1.1 (H) 0.1 - 1.0 K/uL   Eosinophils Relative 0 %   Eosinophils Absolute 0.0 0.0 - 0.5 K/uL   Basophils Relative 1 %   Basophils Absolute 0.1 0.0 - 0.1 K/uL   Immature Granulocytes 0 %   Abs Immature  Granulocytes 0.04 0.00 - 0.07 K/uL  Protime-INR  Result Value Ref Range   Prothrombin Time 13.8 11.4 - 15.2 seconds   INR 1.1 0.8 - 1.2  Magnesium  Result Value Ref Range   Magnesium 1.4 (L) 1.7 - 2.4 mg/dL   Dg Chest 1 View Result Date: 04/20/2018 CLINICAL DATA:  Altered mental status for 1 week. Nausea and vomiting for 1 day. EXAM: CHEST  1 VIEW COMPARISON:  Chest x-rays dated 01/20/2016 and 01/19/2016 FINDINGS: Power port in place with the tip in the superior vena cava at the level of the carina. Heart size is within normal limits considering the AP portable technique and the shallow inspiration. Pulmonary vascularity is normal. No discrete infiltrates or effusions. IMPRESSION: No acute abnormalities. Electronically Signed   By: Lorriane Shire M.D.   On: 04/20/2018 14:44   Ct Head Wo Contrast Result Date: 04/20/2018 CLINICAL DATA:  68 year old female with altered mental status. EXAM: CT HEAD WITHOUT CONTRAST TECHNIQUE: Contiguous axial images were obtained from the base of the skull through the vertex without intravenous contrast. COMPARISON:  10/17/2015 CT FINDINGS: Brain: No evidence of acute infarction, hemorrhage, hydrocephalus, extra-axial collection or mass lesion/mass effect. Atrophy, mild chronic small-vessel white matter ischemic changes and bifrontal encephalomalacia again noted. Vascular:  Atherosclerotic calcifications noted. Skull: Normal. Negative for fracture or focal lesion. Sinuses/Orbits: No acute finding. Other: None. IMPRESSION: 1. No acute intracranial abnormality. 2. Atrophy, mild chronic small-vessel white matter ischemic changes and bifrontal encephalomalacia. Electronically Signed   By: Margarette Canada M.D.   On: 04/20/2018 15:07    Results for CHARLINE, HOSKINSON (MRN [id number]) as of 04/20/2018 15:56  Ref. Range 01/26/2016 04:19 01/26/2016 04:19 02/27/2016 08:35 04/10/2016 09:41 04/20/2018 13:50  BUN Latest Ref Range: 8 - 23 mg/dL 19 18 38 (H) 34 (H) 55 (H)  Creatinine Latest Ref  Range: 0.44 - 1.00 mg/dL 1.46 (H) 1.38 (H) 2.03 (H) 1.80 (H) 4.83 (H)     1550:  New AKI on labs; judicious IVF given. +UTI, UC pending; IV rocephin given. Magnesium repleted IV. Will admit. T/C returned from [facility]. Velia Meyer, case discussed, including:  HPI, pertinent PM/SHx, VS/PE, dx testing, ED course and treatment:  Agreeable to admit.    Final Clinical Impressions(s) / ED Diagnoses   Final diagnoses:  None    ED Discharge Orders    None       Francine Graven, DO 04/24/18 1538

## 2018-04-20 NOTE — H&P (Signed)
History and Physical    PLEASE NOTE THAT DRAGON DICTATION SOFTWARE WAS USED IN THE CONSTRUCTION OF THIS NOTE.   Rachel Morgan KKX:[id number] DOB: January 26, 1950 DOA: 04/20/2018  PCP: [facility] Patient coming from: home  I have personally briefly reviewed patient's old medical records in [place]  Chief Complaint: increased somnolence.  HPI: Rachel Morgan is a 68 y.o. 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  In the context of patient's underlying cognitive developmental delay, the following history is obtained via my discussions with the emergency department physician as well as via chart review.  The patient's family, with whom the patient lives, reportedly have noticed over the last 2 to 3 days the patient to be sleeping more than baseline.  They report that this is been associated with diminished appetite over that time as well as development of foul-smelling urine over the last 1 to 2 days.  Family also reports that the patient had one episode of non-bloody, nonbilious emesis on Friday, 04/17/2018, in the absence of any subsequent episodes of emesis.  Denies any associated diarrhea.  Family is also been routinely checking the patient's temperature at home, and they deny detection of any objective fever, and no new cough.  The patient has no known suspected or confirmed COVID-19 exposures, and no recent traveling.    ED Course: Vital signs in the emergency department were notable for the following: Temperature max 98.6; heart rate 64-78; blood pressure ranged from 118/76-120 9/53; respiratory rate 22, and  oxygen saturation 93 to 95% on room air.  Labs were in the ED were notable for the following: CMP notable for sodium 140, potassium 4.9, creatinine 4.83 relative to most recent prior creatinine data point 1.80 in April 2018, glucose 128.  Serum magnesium level 1.4.  CBC notable for white blood cell count of 8900 with 72% neutrophils.  Lactic acid levels x2 found to be 0.9 followed by 0.8.  Urinalysis showed greater than 50 white blood cells, moderate leukocyte esterace, and 100 proteinuria.    Chest x-ray, per final radiology report showed no evidence of acute cardiopulmonary process, including no evidence of infiltrate, edema, or effusion.  Noncontrast CT of the head, per final radiology report showed no evidence of acute intracranial abnormality.   While still in the emergency department, the patient received the following: A 500 cc IV normal saline bolus followed by initiation of maintenance normal saline at 100 cc/h, and Rocephin 1 g IV x1. Additionally, magnesium sulfate 2 g IV over 2 hours was ordered, with administration of such initiated while still in the ED.  Subsequently, the patient was admitted to the med telemetry floor for further evaluation and management of presenting cute metabolic encephalopathy secondary to suspected underlying urinary tract infection.    Review of Systems: As per HPI otherwise 10 point review of systems negative.   Past Medical History:  Diagnosis Date  . Anxiety   . Calculus of gallbladder   . Cerebral palsy ([place])   . Chronic kidney disease   . Diabetes mellitus   . Gall stones   . GERD (gastroesophageal reflux disease)   . High cholesterol   . History of kidney stones   .  Hypertension   . Hypothyroidism   . Kidney stones   . MR (mental retardation)   . Pyelonephritis   . Sepsis [facility])     Past Surgical History:  Procedure Laterality Date  . CYSTOSCOPY W/ URETERAL STENT PLACEMENT Bilateral 09/26/2015   Procedure: CYSTOSCOPY WITH Bilateral   RETROGRADE PYELOGRAM/URETERAL STENT PLACEMENT;  Surgeon: [name], MD;  Location: [facility];  Service: Urology;  Laterality: Bilateral;  . CYSTOSCOPY W/ URETERAL STENT REMOVAL Left 11/10/2015   Procedure: CYSTOSCOPY WITH STENT REMOVAL;  Surgeon: Cleon Gustin, MD;  Location: [facility];  Service: Urology;  Laterality: Left;  . CYSTOSCOPY WITH STENT PLACEMENT Right 01/21/2016   Procedure: CYSTOSCOPY WITH STENT PLACEMENT;  Surgeon: Franchot Gallo, MD;  Location: [facility];  Service: Urology;  Laterality: Right;  . CYSTOSCOPY/RETROGRADE/URETEROSCOPY/STONE EXTRACTION WITH BASKET Left 10/31/2015   Procedure: CYSTOSCOPY/ BILATERAL URETEROSCOPY/BILATERAL STONE EXTRACTION/ LEFT STENT PLACEMENT;  Surgeon: Irine Seal, MD;  Location: [facility];  Service: Urology;  Laterality: Left;  . CYSTOSCOPY/URETEROSCOPY/HOLMIUM LASER/STENT PLACEMENT Right 02/27/2016   Procedure: CYSTOSCOPY/RIGHT URETEROSCOPY/HOLMIUM LASER/STENT PLACEMENT/STENT REMOVAL;  Surgeon: Irine Seal, MD;  Location: [facility];  Service: Urology;  Laterality: Right;  . HOLMIUM LASER APPLICATION N/A 49/70/2637   Procedure: HOLMIUM LASER APPLICATION;  Surgeon: Irine Seal, MD;  Location: [facility];  Service: Urology;  Laterality: N/A;  . IR FLUORO GUIDE PORT INSERTION RIGHT  04/10/2016  . IR US GUIDE VASC ACCESS RIGHT  04/10/2016    Social History:  reports that she has never smoked. She has never used smokeless tobacco. She reports that she does not drink alcohol or use drugs.   Allergies  Allergen Reactions  . Macrobid [Nitrofurantoin Monohyd Macro] Hives and Swelling  . Penicillins Swelling and Rash    Has patient had a PCN reaction causing immediate rash, facial/tongue/throat swelling, SOB or lightheadedness with hypotension: Yes Has patient had a PCN reaction causing severe rash involving mucus membranes or skin necrosis: Yes Has patient had a PCN reaction that required hospitalization: no Has patient had a PCN reaction occurring within the last 10  years: no If all of the above answers are "NO", then may proceed with Cephalosporin use. Patient tolerated rocephin and ertapenem in the past    Family History  Family history unknown: Yes     Prior to Admission medications   Medication Sig Start Date End Date Taking? Authorizing Provider  acetaminophen (TYLENOL) 500 MG tablet Take 500-1,000 mg by mouth every 6 (six) hours as needed (for pain.).    [provider]  ALPRAZolam Duanne Moron) 0.5 MG tablet Take 1 tablet by mouth daily as needed. 04/09/18   [provider]  ALPRAZolam Duanne Moron) 1 MG tablet Take 1 mg by mouth 2 (two) times daily.     [provider]  aspirin EC 81 MG tablet Take 81 mg by mouth every morning.     [provider]  busPIRone (BUSPAR) 10 MG tablet Take 10 mg by mouth every morning.     [provider]  camphor-menthol Timoteo Ace) lotion Apply 1 application topically every 8 (eight) hours as needed for itching. 01/26/16   Raiford Noble Latif, DO  chlorproMAZINE (THORAZINE) 25 MG tablet Take 25 mg by mouth at bedtime.     [provider]  docusate sodium (ENEMEEZ) 283 MG enema Place 1 enema (283 mg total) rectally as needed for severe constipation. 01/26/16   Sheikh, Omair Latif, DO  escitalopram (LEXAPRO) 20 MG tablet Take 20 mg by mouth at bedtime.    [provider]  hydrOXYzine (ATARAX/VISTARIL) 25 MG tablet Take 1 tablet (25 mg total) by mouth every 8 (eight) hours as needed for itching. 01/26/16   Raiford Noble Latif, DO  iron polysaccharides (NIFEREX) 150 MG capsule Take 1 capsule (150 mg total) by mouth daily. 01/27/16   Raiford Noble Latif, DO  levothyroxine (SYNTHROID, LEVOTHROID) 50 MCG tablet Take 50 mcg by mouth daily before breakfast.    [provider]  Nutritional Supplements (FEEDING SUPPLEMENT, NEPRO CARB STEADY,) LIQD Take 237 mLs by mouth 3 (three) times daily as needed (Supplement). 01/26/16   Sheikh, Omair Latif, DO  OLANZapine (ZYPREXA) 20 MG  tablet Take 20 mg by mouth daily.  07/24/15   [provider]  omeprazole (PRILOSEC) 20 MG capsule Take 20 mg by mouth daily before breakfast.     [provider]  ondansetron (ZOFRAN ODT) 4 MG disintegrating tablet Take 1 tablet (4 mg total) by mouth every 8 (eight) hours as needed for nausea. 01/17/16   Noemi Chapel, MD  oxybutynin (DITROPAN-XL) 10 MG 24 hr tablet Take 10 mg by mouth every morning.     [provider]  senna-docusate (SENOKOT-S) 8.6-50 MG tablet Take 1 tablet by mouth 2 (two) times daily. Patient taking differently: Take 1 tablet by mouth 2 (two) times daily as needed.  01/26/16   Raiford Noble Latif, DO  spironolactone (ALDACTONE) 50 MG tablet Take 50 mg by mouth daily. 10/26/15   [provider]  sulfamethoxazole-trimethoprim (BACTRIM DS,SEPTRA DS) 800-160 MG tablet Take 1 tablet by mouth 2 (two) times daily. 02/27/16   Irine Seal, MD  zolpidem (AMBIEN) 10 MG tablet Take 10 mg by mouth at bedtime.    [provider]     Objective     Physical Exam: Vitals:   04/20/18 1400 04/20/18 1430 04/20/18 1500 04/20/18 1530  BP: (!) 129/53 120/60 118/76 (!) 103/14  Pulse: 78 71 66 64  Resp:      Temp:      TempSrc:      SpO2: 95% 93% 94% 93%    General: appears to be stated age; alert, but not oriented to person, place, time, or self; unable to follow instructions. Skin: warm, dry, no rash Head:  AT/[place] Eyes:  PEARL b/l, EOMI Mouth:  Oral mucosa membranes appear dry, normal dentition Neck: supple; trachea midline Heart:  RRR; did not appreciate any M/R/G Lungs: CTAB, did not appreciate any wheezes, rales, or rhonchi Abdomen: + BS; soft, ND, NT Vascular: 2+ pedal pulses b/l; 2+ radial pulses b/l Extremities: trace edema in b/l LE's; no muscle wasting Neuro: In the setting of patient's inability to follow instructions, unable to perform full neurologic evaluation at this time.  Specifically unable to perform full assessment of  strength, sensation, cranial nerves.  However, patient appears to be spontaneously moving all 4 extremities.    Labs on Admission: I have personally reviewed following labs and imaging studies  CBC: Recent Labs  Lab 04/20/18 1350  WBC 8.9  NEUTROABS 6.4  HGB 11.5*  HCT 36.7  MCV 95.1  PLT 093   Basic Metabolic Panel: Recent Labs  Lab 04/20/18 1350 04/20/18 1357  NA 140  --   K 4.9  --   CL 110  --   CO2 20*  --   GLUCOSE 128*  --   BUN 55*  --   CREATININE 4.83*  --   CALCIUM 8.6*  --   MG  --  1.4*   GFR: CrCl cannot  be calculated (Unknown ideal weight.). Liver Function Tests: Recent Labs  Lab 04/20/18 1350  AST 11*  ALT 10  ALKPHOS 75  BILITOT 0.8  PROT 6.5  ALBUMIN 3.5   No results for input(s): LIPASE, AMYLASE in the last 168 hours. No results for input(s): AMMONIA in the last 168 hours. Coagulation Profile: Recent Labs  Lab 04/20/18 1350  INR 1.1   Cardiac Enzymes: Recent Labs  Lab 04/20/18 1350  TROPONINI <0.03   BNP (last 3 results) No results for input(s): PROBNP in the last 8760 hours. HbA1C: No results for input(s): HGBA1C in the last 72 hours. CBG: No results for input(s): GLUCAP in the last 168 hours. Lipid Profile: No results for input(s): CHOL, HDL, LDLCALC, TRIG, CHOLHDL, LDLDIRECT in the last 72 hours. Thyroid Function Tests: No results for input(s): TSH, T4TOTAL, FREET4, T3FREE, THYROIDAB in the last 72 hours. Anemia Panel: No results for input(s): VITAMINB12, FOLATE, FERRITIN, TIBC, IRON, RETICCTPCT in the last 72 hours. Urine analysis:    Component Value Date/Time   COLORURINE YELLOW 04/20/2018 1350   APPEARANCEUR TURBID (A) 04/20/2018 1350   LABSPEC 1.021 04/20/2018 1350   PHURINE 5.0 04/20/2018 1350   GLUCOSEU NEGATIVE 04/20/2018 1350   HGBUR LARGE (A) 04/20/2018 1350   BILIRUBINUR NEGATIVE 04/20/2018 1350   KETONESUR NEGATIVE 04/20/2018 1350   PROTEINUR 100 (A) 04/20/2018 1350   UROBILINOGEN 1.0 02/11/2011 2035    NITRITE NEGATIVE 04/20/2018 1350   LEUKOCYTESUR MODERATE (A) 04/20/2018 1350    Radiological Exams on Admission: Dg Chest 1 View  Result Date: 04/20/2018 CLINICAL DATA:  Altered mental status for 1 week. Nausea and vomiting for 1 day. EXAM: CHEST  1 VIEW COMPARISON:  Chest x-rays dated 01/20/2016 and 01/19/2016 FINDINGS: Power port in place with the tip in the superior vena cava at the level of the carina. Heart size is within normal limits considering the [facility] portable technique and the shallow inspiration. Pulmonary vascularity is normal. No discrete infiltrates or effusions. IMPRESSION: No acute abnormalities. Electronically Signed   By: Lorriane Shire M.D.   On: 04/20/2018 14:44   Ct Head Wo Contrast  Result Date: 04/20/2018 CLINICAL DATA:  68 year old female with altered mental status. EXAM: CT HEAD WITHOUT CONTRAST TECHNIQUE: Contiguous axial images were obtained from the base of the skull through the vertex without intravenous contrast. COMPARISON:  10/17/2015 CT FINDINGS: Brain: No evidence of acute infarction, hemorrhage, hydrocephalus, extra-axial collection or mass lesion/mass effect. Atrophy, mild chronic small-vessel white matter ischemic changes and bifrontal encephalomalacia again noted. Vascular: Atherosclerotic calcifications noted. Skull: Normal. Negative for fracture or focal lesion. Sinuses/Orbits: No acute finding. Other: None. IMPRESSION: 1. No acute intracranial abnormality. 2. Atrophy, mild chronic small-vessel white matter ischemic changes and bifrontal encephalomalacia. Electronically Signed   By: Margarette Canada M.D.   On: 04/20/2018 15:07     Assessment/Plan   TRENISHA LAFAVOR is a 68 y.o. 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   Principal Problem:   Acute metabolic encephalopathy Active Problems:   Hypothyroidism   Hypertension   AKI (acute kidney injury) ([place])   UTI (urinary tract infection)   Hypomagnesemia   #) Acute metabolic encephalopathy: Family reports that the patient has exhibited 3 to 4  days of increased somnolence relative to baseline, which appears to be on the basis of underlying urinary tract infection, as further described above.  No evidence of additional underlying infectious process at this time, and low index of suspicion for COVID-19 in the absence of any fever, respiratory findings, or known/suspected COVID-19 exposures.  Other suspected contributing factors leading to presenting acute encephalopathy include dehydration as well as consequential buildup of circulating outpatient medications dependent upon renal clearance in the setting of presenting AKI.   Plan: Work-up and management of suspected urinary tract infection, as further described below.  Work-up and management of presenting acute kidney injury, is further described low.  Will keep n.p.o. until mental status improves sufficiently such the patient is able to participate in and pass nursing bedside swallow evaluation.  Check TSH.  Repeat BMP and CBC in the morning.     #) Urinary tract infection: Diagnosis on the basis of urinalysis demonstrating greater than 50 white blood cells with moderate leukocyte esterace in content of presenting acute encephalopathy.  Presentation does not meet criteria for sepsis in the absence of meeting SIRS criteria.  Of note, lactic acid x2 were found to be nonelevated, as described above.  While the patient does have a documented history of allergies to penicillin, it is notable that she received Rocephin in the emergency department today without overt evidence of adverse reaction.   Plan: Continue normal  saline at 100 cc/h.  We will continue the Rocephin that was initiated in the ED today.  Will monitor closely for results of urine culture.  Check blood cultures x2.  Repeat CBC with differential in the morning.    #) Acute kidney injury superimposed on stage III chronic kidney disease: In the context of baseline creatinine of 1.4-1.8, presenting labs reflect creatinine of 4.83, which is relative to most recent prior creatinine value of 1.0 when checked in April 2018.  Suspect prerenal etiologies in the setting of dehydration stemming from report of diminished appetite over the preceding 3 to 4 days as well as increase in GI losses associated with report of episode of vomiting x1 on Friday, 04/17/2018.  Of note is the patient's history of obstructive nephrolithiasis, having undergone cystoscopy with left ureteral stent placement in October 2017 followed by cystoscopy with right ureteral stent placement in January 2018.  Should the patient subsequently not demonstrate clinical improvement in terms of her presenting infection or renal function with interval IV fluids and IV antibiotics, could consider CT abdomen/pelvis to evaluate for the presence of underlying obstructive ureterolithiasis.   Plan: IV fluids, as above.  Check random urine sodium as well as random urine creatinine for purpose of calculating FeNa.  Monitor strict I's and O's and attempt to avoid nephrotoxic agents.  Repeat BMP in the morning.    #) Hypomagnesemia: Presenting labs reflect serum magnesium level of 1.4.  Suspect contributions from recent diminished oral intake as well as episode of vomiting in the days leading up to today's presentation.  2 g of IV magnesium sulfate were initiated in the emergency department this evening.  Plan: Complete 2 g of IV magnesium sulfate started in the ED today.  Monitor on telemetry.  Repeat serum magnesium level in the morning.    #) QTc prolongation: Presenting EKG reflects QTc interval of 562  ms, made more concerning with concomitant presenting hypomagnesemia, as above.  Plan: Magnesium supplementation, as above.  Monitor on telemetry.  Will exhibit caution with resumption of outpatient medications associated with QTC prolongation.      #)  Acquired hypothyroidism: On Synthroid as an outpatient.  Plan: In the setting of presenting acute encephalopathy, will check reflex TSH.  Will resume home Synthroid when patient is able to participate in and pass nursing bedside swallow evaluation.     #) Essential hypertension: It appears that the patient's home antihypertensive regimen is limited to spironolactone.  Patient has been normotensive since presenting to the ED today.  Plan: In the setting of infectious presentation with element of dehydration and AKI, will hold home spironolactone for now.  Close monitoring of ensuing BP via routine vital signs.     DVT prophylaxis: Heparin 5000 units [place] TID + scd's.  Code Status: full (presumed, based upon most recent prior inpatient code status).  Family Communication: (none) Disposition Plan:  Per Rounding Team Consults called: (none)  Admission status: inpatient; med-tele.     PLEASE NOTE THAT DRAGON DICTATION SOFTWARE WAS USED IN THE CONSTRUCTION OF THIS NOTE.   [facility] Pager [phone] From 3PM- 11PM.   Otherwise, please contact night-coverage  [email or web address] Password [facility]  04/20/2018, 3:58 PM

## 2018-04-20 NOTE — ED Notes (Signed)
Guardian updated that pt will be admitted

## 2018-04-21 ENCOUNTER — Encounter (HOSPITAL_COMMUNITY): Payer: Self-pay | Admitting: Family Medicine

## 2018-04-21 ENCOUNTER — Inpatient Hospital Stay (HOSPITAL_COMMUNITY): Payer: Medicare Other

## 2018-04-21 DIAGNOSIS — I10 Essential (primary) hypertension: Secondary | ICD-10-CM

## 2018-04-21 DIAGNOSIS — G9341 Metabolic encephalopathy: Secondary | ICD-10-CM | POA: Diagnosis present

## 2018-04-21 LAB — CBC
HCT: 34.8 % — ABNORMAL LOW (ref 36.0–46.0)
Hemoglobin: 10.7 g/dL — ABNORMAL LOW (ref 12.0–15.0)
MCH: 29.7 pg (ref 26.0–34.0)
MCHC: 30.7 g/dL (ref 30.0–36.0)
MCV: 96.7 fL (ref 80.0–100.0)
Platelets: 181 10*3/uL (ref 150–400)
RBC: 3.6 MIL/uL — ABNORMAL LOW (ref 3.87–5.11)
RDW: 13.5 % (ref 11.5–15.5)
WBC: 7.3 10*3/uL (ref 4.0–10.5)
nRBC: 0 % (ref 0.0–0.2)

## 2018-04-21 LAB — BLOOD CULTURE ID PANEL (REFLEXED)

## 2018-04-21 LAB — BASIC METABOLIC PANEL
Anion gap: 10 (ref 5–15)
BUN: 51 mg/dL — ABNORMAL HIGH (ref 8–23)
CO2: 19 mmol/L — ABNORMAL LOW (ref 22–32)
Calcium: 8 mg/dL — ABNORMAL LOW (ref 8.9–10.3)
Chloride: 110 mmol/L (ref 98–111)
Creatinine, Ser: 4.85 mg/dL — ABNORMAL HIGH (ref 0.44–1.00)
GFR calc Af Amer: 10 mL/min — ABNORMAL LOW (ref >60)
GFR calc non Af Amer: 9 mL/min — ABNORMAL LOW (ref >60)
Glucose, Bld: 104 mg/dL — ABNORMAL HIGH (ref 70–99)
Potassium: 5 mmol/L (ref 3.5–5.1)
Sodium: 139 mmol/L (ref 135–145)

## 2018-04-21 LAB — MAGNESIUM: Magnesium: 1.9 mg/dL (ref 1.7–2.4)

## 2018-04-21 LAB — TSH: TSH: 3.281 u[IU]/mL (ref 0.350–4.500)

## 2018-04-21 MED ORDER — ESCITALOPRAM OXALATE 10 MG PO TABS
10.0000 mg | ORAL_TABLET | Freq: Every day | ORAL | Status: DC
  Administered 2018-04-21 – 2018-04-24 (×4): 10 mg via ORAL
  Filled 2018-04-21 (×4): qty 1

## 2018-04-21 MED ORDER — VANCOMYCIN HCL IN DEXTROSE 1-5 GM/200ML-% IV SOLN
1000.0000 mg | INTRAVENOUS | Status: AC
  Administered 2018-04-21 (×2): 1000 mg via INTRAVENOUS
  Filled 2018-04-21 (×2): qty 200

## 2018-04-21 MED ORDER — SODIUM CHLORIDE 0.9 % IV SOLN
INTRAVENOUS | Status: DC
  Administered 2018-04-21 – 2018-04-25 (×7): via INTRAVENOUS

## 2018-04-21 MED ORDER — SENNOSIDES-DOCUSATE SODIUM 8.6-50 MG PO TABS
1.0000 | ORAL_TABLET | Freq: Two times a day (BID) | ORAL | Status: DC | PRN
  Administered 2018-04-25: 1 via ORAL
  Filled 2018-04-21: qty 1

## 2018-04-21 MED ORDER — CHLORPROMAZINE HCL 25 MG PO TABS
25.0000 mg | ORAL_TABLET | Freq: Two times a day (BID) | ORAL | Status: DC
  Administered 2018-04-21 – 2018-04-25 (×9): 25 mg via ORAL
  Filled 2018-04-21 (×9): qty 1

## 2018-04-21 MED ORDER — VANCOMYCIN HCL 10 G IV SOLR: 2000.0000 mg | INTRAVENOUS | Status: DC

## 2018-04-21 MED ORDER — SODIUM CHLORIDE 0.9 % IV SOLN
2.0000 g | INTRAVENOUS | Status: DC
  Administered 2018-04-21 – 2018-04-23 (×3): 2 g via INTRAVENOUS
  Filled 2018-04-21 (×3): qty 20

## 2018-04-21 MED ORDER — OLANZAPINE 5 MG PO TABS
20.0000 mg | ORAL_TABLET | Freq: Every day | ORAL | Status: DC
  Administered 2018-04-21 – 2018-04-25 (×5): 20 mg via ORAL
  Filled 2018-04-21 (×5): qty 4

## 2018-04-21 MED ORDER — PANTOPRAZOLE SODIUM 40 MG PO TBEC
40.0000 mg | DELAYED_RELEASE_TABLET | Freq: Every day | ORAL | Status: DC
  Administered 2018-04-21 – 2018-04-25 (×5): 40 mg via ORAL
  Filled 2018-04-21 (×5): qty 1

## 2018-04-21 NOTE — Consult Note (Signed)
Referring Provider: No ref. provider found Primary Care Physician:  [facility]phrologist:    Reason for Consultation:     HPI: This is a 68 year old lady with cerebral palsy and cognitive delay.  She has a history of hypothyroidism hypertension and chronic kidney disease with baseline serum creatinine 1.4-1.8 she was admitted with some confusion thought to have an acute metabolic encephalopathy most likely due to worsening renal insufficiency.  There is also consideration given to the fact that she may have a urinary tract infection and confusion could be due to a drug interaction from her psychiatric medications.  She was started on Rocephin pending urine cultures.  She has acute on chronic kidney injury with a creatinine that has increased to 4.83.  It is also notable that she was noted to have some bladder outlet obstruction and underwent urinary catheterization.  Current medications Rocephin 1 g every 24 hours, Thorazine 25 mg twice daily, Lexapro 10 mg daily, Zyprexa 20 mg daily, levothyroxine 50 mcg daily, Protonix 40 mg daily,  Blood pressure 135/55 temperature max 99.1 O2 sats room air 100% urine output 1.4 cc weight 125.5 kg  Sodium 139 potassium 5.0 chloride 110 CO2 to 19 BUN 51 creatinine 4.85 magnesium 1.9 calcium 8.0 WBC 7.3 hemoglobin 10.7 platelets 181 TSH 3.2 Alysis revealed large blood greater than 50 RBCs greater than 50 WBCs.  Urine culture sent.  Head CT no acute intracranial abnormality atrophy mild chronic small vessel disease.   Home medications Xanax 0.5 mg as needed aspirin 81 mg daily BuSpar 10 mg twice daily Thorazine 25 mg twice daily Lexapro 20 mg daily levothyroxine 50 mcg daily Zyprexa 20 mg omeprazole 20 mg daily oxybutynin 10 mg every 24 hours, spironolactone 50 mg daily Ambien 10 mg daily.     Past Medical History:  Diagnosis Date  . Anxiety   . Calculus of gallbladder   . Cerebral palsy ([place])   . Chronic kidney  disease   . Diabetes mellitus   . Gall stones   . GERD (gastroesophageal reflux disease)   . High cholesterol   . History of kidney stones   . Hypertension   . Hypothyroidism   . Kidney stones   . MR (mental retardation)   . Pyelonephritis   . Sepsis [facility])     Past Surgical History:  Procedure Laterality Date  . CYSTOSCOPY W/ URETERAL STENT PLACEMENT Bilateral 09/26/2015   Procedure: CYSTOSCOPY WITH Bilateral  RETROGRADE PYELOGRAM/URETERAL STENT PLACEMENT;  Surgeon: [name], MD;  Location: [facility];  Service: Urology;  Laterality: Bilateral;  . CYSTOSCOPY W/ URETERAL STENT REMOVAL Left 11/10/2015   Procedure: CYSTOSCOPY WITH STENT REMOVAL;  Surgeon: Cleon Gustin, MD;  Location: [facility];  Service: Urology;  Laterality: Left;  . CYSTOSCOPY WITH STENT PLACEMENT Right 01/21/2016   Procedure: CYSTOSCOPY WITH STENT PLACEMENT;  Surgeon: Franchot Gallo, MD;  Location: [facility];  Service: Urology;  Laterality: Right;  . CYSTOSCOPY/RETROGRADE/URETEROSCOPY/STONE EXTRACTION WITH BASKET Left 10/31/2015   Procedure: CYSTOSCOPY/ BILATERAL URETEROSCOPY/BILATERAL STONE EXTRACTION/ LEFT STENT PLACEMENT;  Surgeon: Irine Seal, MD;  Location: [facility];  Service: Urology;  Laterality: Left;  . CYSTOSCOPY/URETEROSCOPY/HOLMIUM LASER/STENT PLACEMENT Right 02/27/2016   Procedure: CYSTOSCOPY/RIGHT URETEROSCOPY/HOLMIUM LASER/STENT PLACEMENT/STENT REMOVAL;  Surgeon: Irine Seal, MD;  Location: [facility];  Service: Urology;  Laterality: Right;  . HOLMIUM LASER APPLICATION N/A [id number]   Procedure: HOLMIUM LASER APPLICATION;  Surgeon: Irine Seal, MD;  Location: [facility];  Service: Urology;  Laterality: N/A;  . IR FLUORO  GUIDE PORT INSERTION RIGHT  04/10/2016  . IR US GUIDE VASC ACCESS RIGHT  04/10/2016    Prior to Admission medications   Medication Sig Start Date End Date Taking? Authorizing Provider  acetaminophen (TYLENOL) 500 MG tablet Take 500-1,000 mg by mouth every 6 (six) hours as needed (for pain.).   Yes  [provider]  ALPRAZolam Duanne Moron) 0.5 MG tablet Take 1 tablet by mouth daily as needed for anxiety.  04/09/18  Yes [provider]  aspirin EC 81 MG tablet Take 81 mg by mouth every morning.    Yes [provider]  busPIRone (BUSPAR) 10 MG tablet Take 10 mg by mouth 2 (two) times daily.    Yes [provider]  chlorproMAZINE (THORAZINE) 25 MG tablet Take 25 mg by mouth 2 (two) times daily. *May take 25mg  three times daily as prescribed*   Yes [provider]  escitalopram (LEXAPRO) 20 MG tablet Take 20 mg by mouth at bedtime.   Yes [provider]  fluconazole (DIFLUCAN) 150 MG tablet Take 150 mg by mouth See admin instructions. Prescribed on 04/15/2018-take one tablet by mouth once; repeat in 72 hours if still symptomatic   Yes [provider]  levothyroxine (SYNTHROID, LEVOTHROID) 50 MCG tablet Take 50 mcg by mouth daily before breakfast.   Yes [provider]  OLANZapine (ZYPREXA) 20 MG tablet Take 20 mg by mouth daily.  07/24/15  Yes [provider]  omeprazole (PRILOSEC) 20 MG capsule Take 20 mg by mouth daily before breakfast.    Yes [provider]  oxybutynin (DITROPAN-XL) 10 MG 24 hr tablet Take 10 mg by mouth every morning.    Yes [provider]  spironolactone (ALDACTONE) 50 MG tablet Take 50 mg by mouth 2 (two) times daily.  10/26/15  Yes [provider]  zolpidem (AMBIEN) 10 MG tablet Take 10 mg by mouth at bedtime.   Yes [provider]  Nutritional Supplements (FEEDING SUPPLEMENT, NEPRO CARB STEADY,) LIQD Take 237 mLs by mouth 3 (three) times daily as needed (Supplement). 01/26/16   Sheikh, Omair Latif, DO  senna-docusate (SENOKOT-S) 8.6-50 MG tablet Take 1 tablet by mouth 2 (two) times daily. Patient taking differently: Take 1 tablet by mouth 2 (two) times daily as needed.  01/26/16   Raiford Noble Latif, DO    Current Facility-Administered Medications  Medication Dose  Route Frequency Provider Last Rate Last Dose  . 0.9 %  sodium chloride infusion   Intravenous Continuous Wynetta Emery, Clanford L, MD 65 mL/hr at 04/21/18 0937    . acetaminophen (TYLENOL) tablet 650 mg  650 mg Oral Q6H PRN Howerter, Justin B, DO       Or  . acetaminophen (TYLENOL) suppository 650 mg  650 mg Rectal Q6H PRN Howerter, Justin B, DO      . cefTRIAXone (ROCEPHIN) 1 g in sodium chloride 0.9 % 100 mL IVPB  1 g Intravenous Q24H Howerter, Justin B, DO      . chlorproMAZINE (THORAZINE) tablet 25 mg  25 mg Oral BID Johnson, Clanford L, MD      . escitalopram (LEXAPRO) tablet 10 mg  10 mg Oral QHS Johnson, Clanford L, MD      . heparin injection 5,000 Units  5,000 Units Subcutaneous Q8H Howerter, Justin B, DO   5,000 Units at 04/21/18 0645  . levothyroxine (SYNTHROID, LEVOTHROID) tablet 50 mcg  50 mcg Oral QAC breakfast Howerter, Justin B, DO   50 mcg at 04/21/18 0645  . OLANZapine (ZYPREXA)  tablet 20 mg  20 mg Oral Daily Johnson, Clanford L, MD   20 mg at 04/21/18 0935  . pantoprazole (PROTONIX) EC tablet 40 mg  40 mg Oral Daily Johnson, Clanford L, MD   40 mg at 04/21/18 0936  . senna-docusate (Senokot-S) tablet 1 tablet  1 tablet Oral BID PRN Murlean Iba, MD        Allergies as of 04/20/2018 - Review Complete 04/20/2018  Allergen Reaction Noted  . Macrobid [nitrofurantoin monohyd macro] Hives and Swelling 09/26/2015  . Penicillins Swelling and Rash 09/14/2015    Family History  Family history unknown: Yes    Social History   Socioeconomic History  . Marital status: Single    Spouse name: Not on file  . Number of children: Not on file  . Years of education: Not on file  . Highest education level: Not on file  Occupational History  . Not on file  Social Needs  . Financial resource strain: Patient refused  . Food insecurity:    Worry: Patient refused    Inability: Patient refused  . Transportation needs:    Medical: Patient refused    Non-medical: Patient refused   Tobacco Use  . Smoking status: Never Smoker  . Smokeless tobacco: Never Used  Substance and Sexual Activity  . Alcohol use: No  . Drug use: No  . Sexual activity: Never    Birth control/protection: None  Lifestyle  . Physical activity:    Days per week: Patient refused    Minutes per session: Patient refused  . Stress: Not at all  Relationships  . Social connections:    Talks on phone: Patient refused    Gets together: Patient refused    Attends religious service: Patient refused    Active member of club or organization: Patient refused    Attends meetings of clubs or organizations: Patient refused    Relationship status: Patient refused  . Intimate partner violence:    Fear of current or ex partner: Patient refused    Emotionally abused: Patient refused    Physically abused: Patient refused    Forced sexual activity: Patient refused  Other Topics Concern  . Not on file  Social History Narrative  . Not on file    Review of Systems: Patient unable to provide further review of systems Physical Exam: Vital signs in last 24 hours: Temp:  [97.4 F (36.3 C)-99.1 F (37.3 C)] 97.4 F (36.3 C) (04/14 0521) Pulse Rate:  [59-78] 64 (04/14 0521) Resp:  [18-24] 18 (04/14 0521) BP: (103-132)/(14-76) 132/55 (04/14 0521) SpO2:  [91 %-100 %] 95 % (04/14 0521) Weight:  [122.5 kg-125.4 kg] 125.4 kg (04/14 0500)   General:   Ill-appearing lady no obvious distress.  She is able to follow commands but she is unable to vocalize.  She seems to be smiling and happy. Head:  Normocephalic and atraumatic. Eyes:  Sclera clear, no icterus.   Conjunctiva pink. Ears:  Normal auditory acuity. Nose:  No deformity, discharge,  or lesions. Mouth:  No deformity or lesions, dentition normal. Neck:  Supple; no masses or thyromegaly. JVP not elevated Lungs:  Clear throughout to auscultation.   No wheezes, crackles, or rhonchi. No acute distress.  There is a port subcutaneously in the right upper  chest Heart:  Regular rate and rhythm; no murmurs, clicks, rubs,  or gallops. Abdomen: Obese difficult to appreciate any organomegaly Msk:  Symmetrical without gross deformities. Normal posture. Pulses:  No carotid, renal, femoral bruits. DP  and PT symmetrical and equal Extremities:  Without clubbing or edema. Neurologic:  Alert and  oriented x4;  grossly normal neurologically. Skin:  Intact without significant lesions or rashes. Cervical Nodes:  No significant cervical adenopathy. Psych:  Alert and cooperative. Normal mood and affect.  Intake/Output from previous day: 04/13 0701 - 04/14 0700 In: 1783.3 [P.O.:240; I.V.:943.3; IV Piggyback:600] Out: 1000 [Urine:1000] Intake/Output this shift: Total I/O In: -  Out: 400 [Urine:400]  Lab Results: Recent Labs    04/20/18 1350 04/21/18 0504  WBC 8.9 7.3  HGB 11.5* 10.7*  HCT 36.7 34.8*  PLT 168 181   BMET Recent Labs    04/20/18 1350 04/21/18 0504  NA 140 139  K 4.9 5.0  CL 110 110  CO2 20* 19*  GLUCOSE 128* 104*  BUN 55* 51*  CREATININE 4.83* 4.85*  CALCIUM 8.6* 8.0*   LFT Recent Labs    04/20/18 1350  PROT 6.5  ALBUMIN 3.5  AST 11*  ALT 10  ALKPHOS 75  BILITOT 0.8   PT/INR Recent Labs    04/20/18 1350  LABPROT 13.8  INR 1.1   Hepatitis Panel No results for input(s): HEPBSAG, HCVAB, HEPAIGM, HEPBIGM in the last 72 hours.  Studies/Results: Dg Chest 1 View  Result Date: 04/20/2018 CLINICAL DATA:  Altered mental status for 1 week. Nausea and vomiting for 1 day. EXAM: CHEST  1 VIEW COMPARISON:  Chest x-rays dated 01/20/2016 and 01/19/2016 FINDINGS: Power port in place with the tip in the superior vena cava at the level of the carina. Heart size is within normal limits considering the AP portable technique and the shallow inspiration. Pulmonary vascularity is normal. No discrete infiltrates or effusions. IMPRESSION: No acute abnormalities. Electronically Signed   By: Lorriane Shire M.D.   On: 04/20/2018 14:44    Ct Head Wo Contrast  Result Date: 04/20/2018 CLINICAL DATA:  68 year old female with altered mental status. EXAM: CT HEAD WITHOUT CONTRAST TECHNIQUE: Contiguous axial images were obtained from the base of the skull through the vertex without intravenous contrast. COMPARISON:  10/17/2015 CT FINDINGS: Brain: No evidence of acute infarction, hemorrhage, hydrocephalus, extra-axial collection or mass lesion/mass effect. Atrophy, mild chronic small-vessel white matter ischemic changes and bifrontal encephalomalacia again noted. Vascular: Atherosclerotic calcifications noted. Skull: Normal. Negative for fracture or focal lesion. Sinuses/Orbits: No acute finding. Other: None. IMPRESSION: 1. No acute intracranial abnormality. 2. Atrophy, mild chronic small-vessel white matter ischemic changes and bifrontal encephalomalacia. Electronically Signed   By: Margarette Canada M.D.   On: 04/20/2018 15:07    Assessment/Plan:  Acute on chronic renal insufficiency.  She did not have much urine output on in the night catheter about 1 L.  We will get a renal ultrasound.  Her urine sediment appears very cloudy and consistent with a urinary tract infection.  Agree with starting antibiotics.  The etiology of acute kidney injury appears a little obscure as it does not appear to be any ischemic injury there does not appear to be any use of non-steroidal anti-inflammatory drugs or ACE inhibitors or ARB's.  She does not appear dehydrated.  I would like to see serial creatinines.  Recheck urine sediment after treatment of UTI.  It may be worthwhile checking bladder scans on her daily.  Hypertension/volume patient appears to be euvolemic  Cerebral palsy with some learning disability patient resident of nursing facility  Anemia does not appear to be an issue at this time  Metabolic acidosis we will start sodium bicarbonate with gentle hydration at 75  cc an hour.  Hypothyroidism continue replacement therapy  Encephalopathy.  She  appears awake alert and following commands to me I am not sure what her baseline is and whether she truly is encephalopathic.  Disposition thank you for this most interesting consult be happy to follow along.   LOS: [place] @TODAY @9 :59 AM

## 2018-04-21 NOTE — Progress Notes (Signed)
Pharmacy Antibiotic Note  Rachel Morgan is a 68 y.o. female admitted on 04/20/2018 with bacteremia.  Pharmacy has been consulted for Vancomycin dosing.  Plan: Vancomycin 2000 mg IV x 1 dose. Will f/u in AM- coag neg staph bacteremia- most likely contaminant which will not need further dosing.   Height: 4\' 11"  (149.9 cm) Weight: 276 lb 7.3 oz (125.4 kg) IBW/kg (Calculated) : 43.2  Temp (24hrs), Avg:98.3 F (36.8 C), Min:97.4 F (36.3 C), Max:99.1 F (37.3 C)  Recent Labs  Lab 04/20/18 1350 04/20/18 1548 04/21/18 0504  WBC 8.9  --  7.3  CREATININE 4.83*  --  4.85*  LATICACIDVEN 0.9 0.8  --     Estimated Creatinine Clearance: 13.9 mL/min (A) (by C-G formula based on SCr of 4.85 mg/dL (H)).    Allergies  Allergen Reactions  . Macrobid [Nitrofurantoin Monohyd Macro] Hives and Swelling  . Penicillins Swelling and Rash    Has patient had a PCN reaction causing immediate rash, facial/tongue/throat swelling, SOB or lightheadedness with hypotension: Yes Has patient had a PCN reaction causing severe rash involving mucus membranes or skin necrosis: Yes Has patient had a PCN reaction that required hospitalization: no Has patient had a PCN reaction occurring within the last 10 years: no If all of the above answers are "NO", then may proceed with Cephalosporin use. Patient tolerated rocephin and ertapenem in the past    Antimicrobials this admission: Rocephin 4/14 >>  Vanco 4/14 >>   Microbiology results: 4/14 BCx: coag neg staph 1/2   Thank you for allowing pharmacy to be a part of this patient's care.  Ramond Craver 04/21/2018 9:01 PM

## 2018-04-21 NOTE — Progress Notes (Signed)
Mid level placed order for in and out catheter.

## 2018-04-21 NOTE — Progress Notes (Signed)
In and Out Cath performed with a 400cc of urine returned

## 2018-04-21 NOTE — Progress Notes (Addendum)
PROGRESS NOTE    Rachel Morgan  ZMO:[id number]  DOB: [birth year]  DOA: 04/20/2018 PCP: [facility]   Brief Admission Hx: 68 year old female with cerebral palsy with cognitive developmental delay, hypothyroidism, hypertension, stage III CKD with reported baseline creatinine 1.4-1.8 admitted with acute metabolic encephalopathy.  MDM/Assessment & Plan:   1. Acute metabolic encephalopathy- multifactorial likely secondary to worsening renal function, urinary tract infection and toxic effects of her psychiatric medications in the setting of renal failure.  Continue supportive therapy.  Treat underlying conditions. 2. Urinary tract infection with cystitis- patient has been started on ceftriaxone pending urine culture results.  Blood cultures pending.  Continue IV fluids for hydration. 3. Acute kidney injury on CKD stage III- renal function testing from 2018 reveals a baseline creatinine of 1.4-1.8.  Her creatinine has now increased to 4.83.  She was noted to have some bladder outlet obstruction that was relieved with in and out catheterization.  We will monitor her renal function closely.  Obtain renal ultrasound.  Request nephrology consultation.  Renally dose medications as appropriate and hold nonessential medications. 4. Hypothyroidism-resume home levothyroxine. 5. Essential hypertension-holding home spironolactone. 6. Cerebral palsy with cognitive developmental delay- resume home Zyprexa and Thorazine as sister reports that she takes these daily for maintenance to avoid behavioral issues.  Reduced doses as necessary. 7. GERD-Protonix ordered for GI protection. 8. Prolonged QTC- she was noted to have some hypomagnesemia which is being treated.  Monitor lytes, daily EKGs, telemetry monitoring.  9. Acute urinary retention- catheterize as needed if unable to void.  Will monitor throughout the day her urine output.  I spoke with her nurse who verbalized  understanding.  DVT prophylaxis: Heparin Code Status: Full Family Communication: I spoke with her Sister [name] who manages her medications.   Disposition Plan: Inpatient   Consultants:  Nephrology  Procedures:  Renal ultrasound pending  Antimicrobials:  Ceftriaxone 04/20/2018>  Subjective: No apparent distress  Objective: Vitals:   04/20/18 1809 04/20/18 2207 04/21/18 0500 04/21/18 0521  BP:  116/61  (!) 132/55  Pulse:  68  64  Resp:  18  18  Temp:  99.1 F (37.3 C)  (!) 97.4 F (36.3 C)  TempSrc:  Oral  Oral  SpO2:  100%  95%  Weight: 122.5 kg  125.4 kg   Height: 4\' 11"  (1.499 m)       Intake/Output Summary (Last 24 hours) at 04/21/2018 0849 Last data filed at 04/21/2018 0300 Gross per 24 hour  Intake 1783.27 ml  Output 1000 ml  Net 783.27 ml   Filed Weights   04/20/18 1809 04/21/18 0500  Weight: 122.5 kg 125.4 kg     REVIEW OF SYSTEMS  Unable to obtain due to cognitive developmental delay  Exam:  General exam: Cerebral palsy with cognitive developmental delay, sitting up in bed in no apparent distress. Respiratory system: Bilateral breath sounds clear to auscultation. No increased work of breathing. Cardiovascular system: Normal S1 & S2 heard.  Gastrointestinal system: Abdomen is nondistended, soft and nontender. Normal bowel sounds heard. Central nervous system:  Pronounced developmental delay. No focal neurological deficits. Extremities: no cyanosis or clubbing.  Data Reviewed: Basic Metabolic Panel: Recent Labs  Lab 04/20/18 1350 04/20/18 1357 04/21/18 0504  NA 140  --  139  K 4.9  --  5.0  CL 110  --  110  CO2 20*  --  19*  GLUCOSE 128*  --  104*  BUN 55*  --  51*  CREATININE  4.83*  --  4.85*  CALCIUM 8.6*  --  8.0*  MG  --  1.4* 1.9   Liver Function Tests: Recent Labs  Lab 04/20/18 1350  AST 11*  ALT 10  ALKPHOS 75  BILITOT 0.8  PROT 6.5  ALBUMIN 3.5   No results for input(s): LIPASE, AMYLASE in the last 168 hours. No  results for input(s): AMMONIA in the last 168 hours. CBC: Recent Labs  Lab 04/20/18 1350 04/21/18 0504  WBC 8.9 7.3  NEUTROABS 6.4  --   HGB 11.5* 10.7*  HCT 36.7 34.8*  MCV 95.1 96.7  PLT 168 181   Cardiac Enzymes: Recent Labs  Lab 04/20/18 1350  TROPONINI <0.03   CBG (last 3)  No results for input(s): GLUCAP in the last 72 hours. Recent Results (from the past 240 hour(s))  Culture, blood (routine x 2)     Status: None (Preliminary result)   Collection Time: 04/20/18  5:11 PM  Result Value Ref Range Status   Specimen Description LEFT ANTECUBITAL  Final   Special Requests   Final    BOTTLES DRAWN AEROBIC AND ANAEROBIC Blood Culture adequate volume   Culture   Final    NO GROWTH < 24 HOURS Performed at [facility], [address], [place], [place] [address]    Report Status PENDING  Incomplete  Culture, blood (routine x 2)     Status: None (Preliminary result)   Collection Time: 04/20/18  5:15 PM  Result Value Ref Range Status   Specimen Description PORTA CATH  Final   Special Requests   Final    BOTTLES DRAWN AEROBIC AND ANAEROBIC Blood Culture adequate volume   Culture   Final    NO GROWTH < 24 HOURS Performed at [facility], [address] [place], [place] [address]    Report Status PENDING  Incomplete     Studies: Dg Chest 1 View  Result Date: 04/20/2018 CLINICAL DATA:  Altered mental status for 1 week. Nausea and vomiting for 1 day. EXAM: CHEST  1 VIEW COMPARISON:  Chest x-rays dated 01/20/2016 and 01/19/2016 FINDINGS: Power port in place with the tip in the superior vena cava at the level of the carina. Heart size is within normal limits considering the AP portable technique and the shallow inspiration. Pulmonary vascularity is normal. No discrete infiltrates or effusions. IMPRESSION: No acute abnormalities. Electronically Signed   By: Lorriane Shire M.D.   On: 04/20/2018 14:44   Ct Head Wo Contrast  Result Date: 04/20/2018 CLINICAL DATA:  68 year old female  with altered mental status. EXAM: CT HEAD WITHOUT CONTRAST TECHNIQUE: Contiguous axial images were obtained from the base of the skull through the vertex without intravenous contrast. COMPARISON:  10/17/2015 CT FINDINGS: Brain: No evidence of acute infarction, hemorrhage, hydrocephalus, extra-axial collection or mass lesion/mass effect. Atrophy, mild chronic small-vessel white matter ischemic changes and bifrontal encephalomalacia again noted. Vascular: Atherosclerotic calcifications noted. Skull: Normal. Negative for fracture or focal lesion. Sinuses/Orbits: No acute finding. Other: None. IMPRESSION: 1. No acute intracranial abnormality. 2. Atrophy, mild chronic small-vessel white matter ischemic changes and bifrontal encephalomalacia. Electronically Signed   By: Margarette Canada M.D.   On: 04/20/2018 15:07     Scheduled Meds: . heparin  5,000 Units Subcutaneous Q8H  . levothyroxine  50 mcg Oral QAC breakfast   Continuous Infusions: . sodium chloride 100 mL/hr at 04/21/18 0845  . cefTRIAXone (ROCEPHIN)  IV      Principal Problem:   Acute metabolic encephalopathy Active Problems:  Hypothyroidism   Hypertension   AKI (acute kidney injury) ([place])   UTI (urinary tract infection)   Hypomagnesemia   Time spent:   Irwin Brakeman, MD [facility] 04/21/2018, 8:49 AM    LOS: 1 day  How to contact the [facility] Attending or Consulting provider [place] or covering provider during after hours [place], for this patient?  1. Check the care team in [facility] and look for a) attending/consulting [facility] provider listed and b) the [facility] team listed 2. Log into [email or web address] and use [facility] universal password to access. If you do not have the password, please contact the hospital operator. 3. Locate the [facility] provider you are looking for under [facility] and page to a number that you can be directly reached. 4. If you still have difficulty reaching the provider, please page the [facility] (Director on Call) for  the Hospitalists listed on amion for assistance.

## 2018-04-21 NOTE — Progress Notes (Signed)
performed in and out catheter. 500cc returned from bladder. Will continue to monitor throughout shift.

## 2018-04-21 NOTE — Progress Notes (Signed)
Patient has not voided at this time. Bladder scanned showed 492 in bladder. Paged mid level. No new orders at this time. Will continue to monitor throughout shift.

## 2018-04-22 ENCOUNTER — Inpatient Hospital Stay (HOSPITAL_COMMUNITY): Payer: Medicare Other

## 2018-04-22 ENCOUNTER — Encounter (HOSPITAL_COMMUNITY)
Admission: EM | Disposition: A | Discharge status: Home / Self Care | Payer: Self-pay | Source: Home / Self Care | Attending: Internal Medicine

## 2018-04-22 ENCOUNTER — Encounter (HOSPITAL_COMMUNITY): Payer: Self-pay | Admitting: Anesthesiology

## 2018-04-22 ENCOUNTER — Inpatient Hospital Stay (HOSPITAL_COMMUNITY): Payer: Medicare Other | Admitting: Anesthesiology

## 2018-04-22 DIAGNOSIS — A419 Sepsis, unspecified organism: Secondary | ICD-10-CM | POA: Diagnosis not present

## 2018-04-22 DIAGNOSIS — N179 Acute kidney failure, unspecified: Secondary | ICD-10-CM

## 2018-04-22 DIAGNOSIS — N201 Calculus of ureter: Secondary | ICD-10-CM

## 2018-04-22 DIAGNOSIS — N132 Hydronephrosis with renal and ureteral calculous obstruction: Secondary | ICD-10-CM | POA: Diagnosis not present

## 2018-04-22 HISTORY — PX: CYSTOSCOPY W/ URETERAL STENT PLACEMENT: SHX1429

## 2018-04-22 LAB — CREATININE, SERUM
Creatinine, Ser: 4.53 mg/dL — ABNORMAL HIGH (ref 0.44–1.00)
GFR calc Af Amer: 11 mL/min — ABNORMAL LOW (ref >60)
GFR calc non Af Amer: 10 mL/min — ABNORMAL LOW (ref >60)

## 2018-04-22 LAB — HIV ANTIBODY (ROUTINE TESTING W REFLEX): HIV Screen 4th Generation wRfx: NONREACTIVE

## 2018-04-22 SURGERY — CYSTOSCOPY, WITH RETROGRADE PYELOGRAM AND URETERAL STENT INSERTION
Anesthesia: General | Laterality: Right

## 2018-04-22 MED ORDER — LACTATED RINGERS IV SOLN: INTRAVENOUS | Status: DC

## 2018-04-22 MED ORDER — ONDANSETRON HCL 4 MG/2ML IJ SOLN
INTRAMUSCULAR | Status: AC
  Filled 2018-04-22: qty 2

## 2018-04-22 MED ORDER — PROMETHAZINE HCL 25 MG/ML IJ SOLN: 6.2500 mg | INTRAMUSCULAR | Status: DC | PRN

## 2018-04-22 MED ORDER — HYDROMORPHONE HCL 1 MG/ML IJ SOLN: 0.2500 mg | INTRAMUSCULAR | Status: DC | PRN

## 2018-04-22 MED ORDER — DIATRIZOATE MEGLUMINE 30 % UR SOLN
URETHRAL | Status: AC
  Filled 2018-04-22: qty 100

## 2018-04-22 MED ORDER — SODIUM BICARBONATE 650 MG PO TABS
650.0000 mg | ORAL_TABLET | Freq: Two times a day (BID) | ORAL | Status: DC
  Administered 2018-04-22 – 2018-04-25 (×7): 650 mg via ORAL
  Filled 2018-04-22 (×7): qty 1

## 2018-04-22 MED ORDER — LIDOCAINE 2% (20 MG/ML) 5 ML SYRINGE
INTRAMUSCULAR | Status: DC | PRN
  Administered 2018-04-22: 40 mg via INTRAVENOUS

## 2018-04-22 MED ORDER — SUCCINYLCHOLINE CHLORIDE 20 MG/ML IJ SOLN
INTRAMUSCULAR | Status: DC | PRN
  Administered 2018-04-22: 10 mg via INTRAVENOUS

## 2018-04-22 MED ORDER — HYDROCODONE-ACETAMINOPHEN 7.5-325 MG PO TABS: 1.0000 | ORAL_TABLET | Freq: Once | ORAL | Status: DC | PRN

## 2018-04-22 MED ORDER — FENTANYL CITRATE (PF) 100 MCG/2ML IJ SOLN
INTRAMUSCULAR | Status: DC | PRN
  Administered 2018-04-22 (×2): 25 ug via INTRAVENOUS

## 2018-04-22 MED ORDER — MIDAZOLAM HCL 2 MG/2ML IJ SOLN: 0.5000 mg | Freq: Once | INTRAMUSCULAR | Status: DC | PRN

## 2018-04-22 MED ORDER — GLYCOPYRROLATE PF 0.2 MG/ML IJ SOSY
PREFILLED_SYRINGE | INTRAMUSCULAR | Status: DC | PRN
  Administered 2018-04-22: .2 mg via INTRAVENOUS

## 2018-04-22 MED ORDER — FENTANYL CITRATE (PF) 100 MCG/2ML IJ SOLN
INTRAMUSCULAR | Status: AC
  Filled 2018-04-22: qty 2

## 2018-04-22 MED ORDER — DIATRIZOATE MEGLUMINE 30 % UR SOLN
URETHRAL | Status: DC | PRN
  Administered 2018-04-22: 15:00:00 10 mL via URETHRAL

## 2018-04-22 MED ORDER — PROPOFOL 10 MG/ML IV BOLUS
INTRAVENOUS | Status: DC | PRN
  Administered 2018-04-22: 100 mg via INTRAVENOUS

## 2018-04-22 MED ORDER — STERILE WATER FOR IRRIGATION IR SOLN
Status: DC | PRN
  Administered 2018-04-22: 1000 mL

## 2018-04-22 MED ORDER — SODIUM CHLORIDE 0.9 % IR SOLN
Status: DC | PRN
  Administered 2018-04-22: 3000 mL

## 2018-04-22 MED ORDER — SODIUM BICARBONATE 8.4 % IV SOLN
INTRAVENOUS | Status: AC
  Filled 2018-04-22: qty 50

## 2018-04-22 SURGICAL SUPPLY — 22 items
BAG DRAIN URO TABLE W/ADPT NS (BAG) ×3 IMPLANT
CATH INTERMIT  6FR 70CM (CATHETERS) ×3 IMPLANT
CLOTH BEACON ORANGE TIMEOUT ST (SAFETY) ×3 IMPLANT
DECANTER SPIKE VIAL GLASS SM (MISCELLANEOUS) ×3 IMPLANT
GLOVE BIO SURGEON STRL SZ7 (GLOVE) ×3 IMPLANT
GLOVE BIO SURGEON STRL SZ8 (GLOVE) ×3 IMPLANT
GLOVE BIOGEL PI IND STRL 7.0 (GLOVE) ×2 IMPLANT
GLOVE BIOGEL PI INDICATOR 7.0 (GLOVE) ×4
GOWN STRL REUS W/TWL LRG LVL3 (GOWN DISPOSABLE) ×3 IMPLANT
GOWN STRL REUS W/TWL XL LVL3 (GOWN DISPOSABLE) ×3 IMPLANT
GUIDEWIRE STR ZIPWIRE 035X150 (MISCELLANEOUS) ×3 IMPLANT
HOVERMATT SINGLE USE (MISCELLANEOUS) ×3 IMPLANT
IV NS IRRIG 3000ML ARTHROMATIC (IV SOLUTION) ×3 IMPLANT
KIT TURNOVER CYSTO (KITS) ×3 IMPLANT
MANIFOLD NEPTUNE II (INSTRUMENTS) ×3 IMPLANT
PACK CYSTO (CUSTOM PROCEDURE TRAY) ×3 IMPLANT
PAD ARMBOARD 7.5X6 YLW CONV (MISCELLANEOUS) ×3 IMPLANT
STENT URET 6FRX24 CONTOUR (STENTS) ×3 IMPLANT
SYR 10ML LL (SYRINGE) ×3 IMPLANT
TOWEL OR 17X26 4PK STRL BLUE (TOWEL DISPOSABLE) ×3 IMPLANT
TRAY FOLEY MTR SLVR 16FR STAT (SET/KITS/TRAYS/PACK) ×3 IMPLANT
WATER STERILE IRR 500ML POUR (IV SOLUTION) ×3 IMPLANT

## 2018-04-22 NOTE — Progress Notes (Signed)
[facility] ROUNDING NOTE   Subjective:   Ms. Rachel Morgan received a dose of vancomycin 2 g 04/21/2018.  This is a 68 year old lady with cerebral palsy and cognitive/delay.  She has a history of hypothyroidism hypertension chronic kidney disease baseline serum creatinine 1.4-1.8 she was admitted with some confusion acute metabolic encephalopathy.  The confusion appears to have resolved.  She has a urinary tract infection is been treated with Rocephin.  She has gram-positive cocci growing in the Port-A-Cath.  She also had a urine culture that is being sent for ID.  Blood pressure 117/62 pulse 71 temperature 99.3 O2 sats 93% room air   Labs from today I have a creatinine of 4.53.  There were no other labs.  Renal ultrasound showed moderate right hydronephrosis.  Rocephin 2 g every 24 hours, vancomycin, Thorazine 25 mg twice daily, Lexapro 10 mg nightly, Zyprexa 20 mg daily levothyroxine 50 mcg q. breakfast.  Protonix 40 mg daily    Objective:  Vital signs in last 24 hours:  Temp:  [98.8 F (37.1 C)-99.3 F (37.4 C)] 99.3 F (37.4 C) (04/15 0553) Pulse Rate:  [71-73] 71 (04/15 0553) Resp:  [20] 20 (04/15 0553) BP: (117-136)/(60-62) 117/62 (04/15 0553) SpO2:  [92 %-93 %] 93 % (04/15 0553) Weight:  [130.2 kg] 130.2 kg (04/15 0500)  Weight change: 7.7 kg Filed Weights   04/20/18 1809 04/21/18 0500 04/22/18 0500  Weight: 122.5 kg 125.4 kg 130.2 kg    Intake/Output: I/O last 3 completed shifts: In: 2901.6 [P.O.:840; I.V.:1961.6; IV Piggyback:100] Out: 2300 [Urine:2300]   Intake/Output this shift:  Total I/O In: 120 [P.O.:120] Out: -   CVS- RRR JVP not elevated no murmurs rubs gallops RS-clear to auscultation without wheezes or rales ABD- BS present soft non-distended EXT- no edema   Basic Metabolic Panel: Recent Labs  Lab 04/20/18 1350 04/20/18 1357 04/21/18 0504 04/22/18 0529  NA 140  --  139  --   K 4.9  --  5.0  --   CL 110  --  110  --   CO2 20*  --   19*  --   GLUCOSE 128*  --  104*  --   BUN 55*  --  51*  --   CREATININE 4.83*  --  4.85* 4.53*  CALCIUM 8.6*  --  8.0*  --   MG  --  1.4* 1.9  --     Liver Function Tests: Recent Labs  Lab 04/20/18 1350  AST 11*  ALT 10  ALKPHOS 75  BILITOT 0.8  PROT 6.5  ALBUMIN 3.5   No results for input(s): LIPASE, AMYLASE in the last 168 hours. No results for input(s): AMMONIA in the last 168 hours.  CBC: Recent Labs  Lab 04/20/18 1350 04/21/18 0504  WBC 8.9 7.3  NEUTROABS 6.4  --   HGB 11.5* 10.7*  HCT 36.7 34.8*  MCV 95.1 96.7  PLT 168 181    Cardiac Enzymes: Recent Labs  Lab 04/20/18 1350  TROPONINI <0.03    BNP: Invalid input(s): POCBNP  CBG: No results for input(s): GLUCAP in the last 168 hours.  Microbiology: Results for orders placed or performed during the hospital encounter of 04/20/18  Urine culture     Status: None (Preliminary result)   Collection Time: 04/20/18  1:50 PM  Result Value Ref Range Status   Specimen Description   Final    URINE, CATHETERIZED Performed at [facility], [address], [place], [place] [address]    Special  Requests   Final    NONE Performed at [facility], [address] [place], [place] [address]    Culture   Final    CULTURE REINCUBATED FOR BETTER GROWTH Performed at [facility], [place] [address], [place], [place] [address]    Report Status PENDING  Incomplete  Culture, blood (routine x 2)     Status: None (Preliminary result)   Collection Time: 04/20/18  5:11 PM  Result Value Ref Range Status   Specimen Description LEFT ANTECUBITAL  Final   Special Requests   Final    BOTTLES DRAWN AEROBIC AND ANAEROBIC Blood Culture adequate volume   Culture   Final    NO GROWTH 2 DAYS Performed at [facility], [address], [place], [place] [address]    Report Status PENDING  Incomplete  Culture, blood (routine x 2)     Status: None (Preliminary result)   Collection Time: 04/20/18  5:15 PM  Result Value Ref Range  Status   Specimen Description   Final    PORTA CATH Performed at [facility], [address], [place], [place] [address]    Special Requests   Final    BOTTLES DRAWN AEROBIC AND ANAEROBIC Blood Culture adequate volume Performed at [facility], [address] [place], [place] [address]    Culture  Setup Time   Final    GRAM POSITIVE COCCI IN BOTH AEROBIC AND ANAEROBIC BOTTLES Gram Stain Report Called to,Read Back By and Verified With: COX,D AT 1605 ON 4.14.20 BY ISLEY,B Performed at [place], READ BACK BY AND VERIFIED WITH: R PATE RN 04/21/18 1948 JDW Performed at [facility], [place] [address], [place],  [address]    Culture GRAM POSITIVE COCCI  Final   Report Status PENDING  Incomplete  Blood Culture ID Panel (Reflexed)     Status: Abnormal   Collection Time: 04/20/18  5:15 PM  Result Value Ref Range Status   Enterococcus species NOT DETECTED NOT DETECTED Final   Listeria monocytogenes NOT DETECTED NOT DETECTED Final   Staphylococcus species DETECTED (A) NOT DETECTED Final    Comment: Methicillin (oxacillin) resistant coagulase negative staphylococcus. Possible blood culture contaminant (unless isolated from more than one blood culture draw or clinical case suggests pathogenicity). No antibiotic treatment is indicated for blood  culture contaminants. CRITICAL RESULT CALLED TO, READ BACK BY AND VERIFIED WITH: R PATE RN 04/21/18 1948 JDW    Staphylococcus aureus (BCID) NOT DETECTED NOT DETECTED Final   Methicillin resistance DETECTED (A) NOT DETECTED Final    Comment: CRITICAL RESULT CALLED TO, READ BACK BY AND VERIFIED WITH: R PATE RN 04/21/18 1948 JDW    Streptococcus species NOT DETECTED NOT DETECTED Final   Streptococcus agalactiae NOT DETECTED NOT DETECTED Final   Streptococcus pneumoniae NOT DETECTED NOT DETECTED Final   Streptococcus pyogenes NOT DETECTED NOT DETECTED Final   Acinetobacter baumannii NOT DETECTED NOT DETECTED  Final   Enterobacteriaceae species NOT DETECTED NOT DETECTED Final   Enterobacter cloacae complex NOT DETECTED NOT DETECTED Final   Escherichia coli NOT DETECTED NOT DETECTED Final   Klebsiella oxytoca NOT DETECTED NOT DETECTED Final   Klebsiella pneumoniae NOT DETECTED NOT DETECTED Final   Proteus species NOT DETECTED NOT DETECTED Final   Serratia marcescens NOT DETECTED NOT DETECTED Final   Haemophilus influenzae NOT DETECTED NOT DETECTED Final   Neisseria meningitidis NOT DETECTED NOT DETECTED Final   Pseudomonas aeruginosa NOT DETECTED NOT DETECTED Final  Candida albicans NOT DETECTED NOT DETECTED Final   Candida glabrata NOT DETECTED NOT DETECTED Final   Candida krusei NOT DETECTED NOT DETECTED Final   Candida parapsilosis NOT DETECTED NOT DETECTED Final   Candida tropicalis NOT DETECTED NOT DETECTED Final    Comment: Performed at [facility], [place] [address], [place], [place] [address]    Coagulation Studies: Recent Labs    04/20/18 1350  LABPROT 13.8  INR 1.1    Urinalysis: Recent Labs    04/20/18 1350  COLORURINE YELLOW  LABSPEC 1.021  PHURINE 5.0  GLUCOSEU NEGATIVE  HGBUR LARGE*  BILIRUBINUR NEGATIVE  KETONESUR NEGATIVE  PROTEINUR 100*  NITRITE NEGATIVE  LEUKOCYTESUR MODERATE*      Imaging: Dg Chest 1 View  Result Date: 04/20/2018 CLINICAL DATA:  Altered mental status for 1 week. Nausea and vomiting for 1 day. EXAM: CHEST  1 VIEW COMPARISON:  Chest x-rays dated 01/20/2016 and 01/19/2016 FINDINGS: Power port in place with the tip in the superior vena cava at the level of the carina. Heart size is within normal limits considering the AP portable technique and the shallow inspiration. Pulmonary vascularity is normal. No discrete infiltrates or effusions. IMPRESSION: No acute abnormalities. Electronically Signed   By: Lorriane Shire M.D.   On: 04/20/2018 14:44   Ct Head Wo Contrast  Result Date: 04/20/2018 CLINICAL DATA:  68 year old female with altered  mental status. EXAM: CT HEAD WITHOUT CONTRAST TECHNIQUE: Contiguous axial images were obtained from the base of the skull through the vertex without intravenous contrast. COMPARISON:  10/17/2015 CT FINDINGS: Brain: No evidence of acute infarction, hemorrhage, hydrocephalus, extra-axial collection or mass lesion/mass effect. Atrophy, mild chronic small-vessel white matter ischemic changes and bifrontal encephalomalacia again noted. Vascular: Atherosclerotic calcifications noted. Skull: Normal. Negative for fracture or focal lesion. Sinuses/Orbits: No acute finding. Other: None. IMPRESSION: 1. No acute intracranial abnormality. 2. Atrophy, mild chronic small-vessel white matter ischemic changes and bifrontal encephalomalacia. Electronically Signed   By: Margarette Canada M.D.   On: 04/20/2018 15:07   US Renal  Result Date: 04/21/2018 CLINICAL DATA:  Acute kidney injury. EXAM: RENAL / URINARY TRACT ULTRASOUND COMPLETE COMPARISON:  Ultrasound of June 21, 2016. FINDINGS: Right Kidney: Renal measurements: 11.9 x 5.8 x 8.1 cm = volume: 297 mL. Moderate hydronephrosis is noted. Possible 14 mm calculus seen in lower pole collecting system. Echogenicity within normal limits. No mass visualized. Left Kidney: Renal measurements: 9.5 x 5.1 x 4.9 cm = volume: 125 mL. Echogenicity within normal limits. No mass or hydronephrosis visualized. Bladder: Not visualized. IMPRESSION: Moderate right hydronephrosis is noted with possible nonobstructive calculus seen in lower pole collecting system of right kidney. Left kidney is unremarkable. Electronically Signed   By: Marijo Conception M.D.   On: 04/21/2018 12:23     Medications:   . sodium chloride 65 mL/hr at 04/22/18 0213  . cefTRIAXone (ROCEPHIN)  IV 2 g (04/21/18 1652)   . chlorproMAZINE  25 mg Oral BID  . escitalopram  10 mg Oral QHS  . heparin  5,000 Units Subcutaneous Q8H  . levothyroxine  50 mcg Oral QAC breakfast  . OLANZapine  20 mg Oral Daily  . pantoprazole  40 mg  Oral Daily   acetaminophen **OR** acetaminophen, senna-docusate  Assessment/ Plan:   Acute on chronic renal insufficiency appears that she made 1.4 L 04/21/2018.  She is awaiting results of culture.  There appeared to be some gram-positive cocci from the port blood culture was negative I wonder if this  is a contaminant.  She did receive vancomycin 2 g last night.  I would probably avoid vancomycin right now as she is in acute renal failure.  Alternative would be daptomycin.  She has right-sided hydronephrosis with right-sided calculi.  She either needs percutaneous nephrostomy or she needs urology see her.  I discussed this with Dr. Roderic Palau.  Urology consultation will be obtained today.  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  Will correct with oral sodium bicarbonate  Encephalopathy stable   LOS: 2 Sherril Croon @TODAY @9 :52 AM

## 2018-04-22 NOTE — Addendum Note (Signed)
Addendum  created 04/22/18 1628 by Mickel Baas, CRNA   Charge Capture section accepted, Problem List reviewed, Review and Sign - Signed, Visit diagnoses modified

## 2018-04-22 NOTE — Anesthesia Procedure Notes (Signed)
Procedure Name: Intubation Date/Time: 04/22/2018 3:01 PM Performed by: Andree Elk, Renn Stille A, CRNA Pre-anesthesia Checklist: Patient identified, Patient being monitored, Timeout performed, Emergency Drugs available and Suction available Patient Re-evaluated:Patient Re-evaluated prior to induction Oxygen Delivery Method: Circle system utilized Preoxygenation: Pre-oxygenation with 100% oxygen Induction Type: IV induction, Rapid sequence and Cricoid Pressure applied Laryngoscope Size: Mac and 3 Grade View: Grade I Tube type: Oral Tube size: 7.0 mm Number of attempts: 1 Airway Equipment and Method: Stylet Placement Confirmation: ETT inserted through vocal cords under direct vision,  positive ETCO2 and breath sounds checked- equal and bilateral Secured at: 21 cm Tube secured with: Tape Dental Injury: Teeth and Oropharynx as per pre-operative assessment

## 2018-04-22 NOTE — Op Note (Signed)
.  Preoperative diagnosis: right UPJ stone, sepsis  Postoperative diagnosis: Same  Procedure: 1 cystoscopy 2. right retrograde pyelography 3.  Intraoperative fluoroscopy, under one hour, with interpretation 4. right 6 x 24 JJ stent placement  Attending: [name]  Anesthesia: General  Estimated blood loss: None  Drains: Right 6 x 24 JJ ureteral stent without tether, 16 French foley catheter  Specimens: none  Antibiotics: rocephin  Findings:right mid ureteral calculi. severe hydronephrosis. No masses/lesions in the bladder. Ureteral orifices in normal anatomic location.  Indications: Patient is a 68 year old female with a history of right ureteral stone and concern for sepsis.  After discussing treatment options, they decided proceed with right stent placement.  Procedure her in detail: The patient was brought to the operating room and a brief timeout was done to ensure correct patient, correct procedure, correct site.  General anesthesia was administered patient was placed in dorsal lithotomy position.  Their genitalia was then prepped and draped in usual sterile fashion.  A rigid 17 French cystoscope was passed in the urethra and the bladder.  Bladder was inspected free masses or lesions.  the ureteral orifices were in the normal orthotopic locations.  a 6 french ureteral catheter was then instilled into the right ureteral orifice.  a gentle retrograde was obtained and findings noted above.  we then placed a zip wire through the ureteral catheter and advanced up to the renal pelvis.    We then placed a 6 x 24 double-j ureteral stent over the original zip wire.  We then removed the wire and good coil was noted in the the renal pelvis under fluoroscopy and the bladder under direct vision.  A foley catheter was then placed. the bladder was then drained and this concluded the procedure which was well tolerated by patient.  Complications: None  Condition: Stable, extubated, transferred  to PACU  Plan: Patient is to be admitted for IV antibiotics. She will have her stone extraction in 2 weeks.

## 2018-04-22 NOTE — Anesthesia Postprocedure Evaluation (Signed)
Anesthesia Post Note  Patient: Rachel Morgan  Procedure(s) Performed: CYSTOSCOPY WITH RETROGRADE PYELOGRAM/URETERAL STENT PLACEMENT (Right )  Patient location during evaluation: Nursing Unit Anesthesia Type: General Level of consciousness: awake and alert (Patient at baseline;) Pain management: pain level controlled Vital Signs Assessment: post-procedure vital signs reviewed and stable Respiratory status: spontaneous breathing Cardiovascular status: stable Postop Assessment: no apparent nausea or vomiting Anesthetic complications: no     Last Vitals:  Vitals:   04/22/18 1532 04/22/18 1545  BP: (!) 140/49 (!) 120/53  Pulse: 82 78  Resp: (!) 22 (!) 22  Temp: 37 C   SpO2: 100% 100%    Last Pain:  Vitals:   04/22/18 1434  TempSrc:   PainSc: 8                  Yehudit Fulginiti A

## 2018-04-22 NOTE — Consult Note (Signed)
Urology Consult  Referring physician: Dr. Roderic Palau Reason for referral: right hydronephrosis, acute renal failure  Chief Complaint: abdominal pain  History of Present Illness: Rachel Morgan is a 68yo with a hx of nephrolithiasis and CKD admitted on 4/13 with acute renal failure. She is having a low grade fever today. She has intermittent mild sharp nonraditing right abdominal pain. No other associated signs/symptoms. No exacerbating/alleviaitng events. CT stone study shows a 6-49mm right mid ureteral calculus with severe hydronephrosis. Her last stone event was 1 year ago. No gross hematuria. No LUTS  Past Medical History:  Diagnosis Date  . Anxiety   . Calculus of gallbladder   . Cerebral palsy ([place])   . Chronic kidney disease   . Diabetes mellitus   . Gall stones   . GERD (gastroesophageal reflux disease)   . High cholesterol   . History of kidney stones   . Hypertension   . Hypothyroidism   . Kidney stones   . MR (mental retardation)   . Pyelonephritis   . Sepsis [facility])    Past Surgical History:  Procedure Laterality Date  . CYSTOSCOPY W/ URETERAL STENT PLACEMENT Bilateral 09/26/2015   Procedure: CYSTOSCOPY WITH Bilateral  RETROGRADE PYELOGRAM/URETERAL STENT PLACEMENT;  Surgeon: [name], MD;  Location: [facility];  Service: Urology;  Laterality: Bilateral;  . CYSTOSCOPY W/ URETERAL STENT REMOVAL Left 11/10/2015   Procedure: CYSTOSCOPY WITH STENT REMOVAL;  Surgeon: Cleon Gustin, MD;  Location: [facility];  Service: Urology;  Laterality: Left;  . CYSTOSCOPY WITH STENT PLACEMENT Right 01/21/2016   Procedure: CYSTOSCOPY WITH STENT PLACEMENT;  Surgeon: Franchot Gallo, MD;  Location: [facility];  Service: Urology;  Laterality: Right;  . CYSTOSCOPY/RETROGRADE/URETEROSCOPY/STONE EXTRACTION WITH BASKET Left 10/31/2015   Procedure: CYSTOSCOPY/ BILATERAL URETEROSCOPY/BILATERAL STONE EXTRACTION/ LEFT STENT PLACEMENT;  Surgeon: Irine Seal, MD;  Location: [facility];  Service: Urology;  Laterality: Left;   . CYSTOSCOPY/URETEROSCOPY/HOLMIUM LASER/STENT PLACEMENT Right 02/27/2016   Procedure: CYSTOSCOPY/RIGHT URETEROSCOPY/HOLMIUM LASER/STENT PLACEMENT/STENT REMOVAL;  Surgeon: Irine Seal, MD;  Location: [facility];  Service: Urology;  Laterality: Right;  . HOLMIUM LASER APPLICATION N/A 09/32/3557   Procedure: HOLMIUM LASER APPLICATION;  Surgeon: Irine Seal, MD;  Location: [facility];  Service: Urology;  Laterality: N/A;  . IR FLUORO GUIDE PORT INSERTION RIGHT  04/10/2016  . IR US GUIDE VASC ACCESS RIGHT  04/10/2016    Medications: I have reviewed the patient's current medications. Allergies:  Allergies  Allergen Reactions  . Macrobid [Nitrofurantoin Monohyd Macro] Hives and Swelling  . Penicillins Swelling and Rash    Has patient had a PCN reaction causing immediate rash, facial/tongue/throat swelling, SOB or lightheadedness with hypotension: Yes Has patient had a PCN reaction causing severe rash involving mucus membranes or skin necrosis: Yes Has patient had a PCN reaction that required hospitalization: no Has patient had a PCN reaction occurring within the last 10 years: no If all of the above answers are "NO", then may proceed with Cephalosporin use. Patient tolerated rocephin and ertapenem in the past    Family History  Family history unknown: Yes   Social History:  reports that she has never smoked. She has never used smokeless tobacco. She reports that she does not drink alcohol or use drugs.  Review of Systems  Constitutional: Positive for fever.  Genitourinary: Positive for flank pain.  All other systems reviewed and are negative.   Physical Exam:  Vital signs in last 24 hours: Temp:  [98.8 F (37.1 C)-99.3 F (37.4 C)] 99.3 F (37.4 C) (04/15 0553) Pulse  Rate:  [71-73] 71 (04/15 0553) Resp:  [20] 20 (04/15 0553) BP: (117-136)/(60-62) 117/62 (04/15 0553) SpO2:  [92 %-93 %] 93 % (04/15 0553) Weight:  [130.2 kg] 130.2 kg (04/15 0500) Physical Exam  Constitutional: She is oriented  to person, place, and time. She appears well-developed and well-nourished.  HENT:  Head: Normocephalic and atraumatic.  Eyes: Pupils are equal, round, and reactive to light. EOM are normal.  Neck: Normal range of motion. No thyromegaly present.  Cardiovascular: Normal rate and regular rhythm.  Respiratory: Effort normal. No respiratory distress.  GI: Soft. She exhibits no distension. There is no abdominal tenderness.  Musculoskeletal: Normal range of motion.        General: Edema present.  Neurological: She is alert and oriented to person, place, and time.  Skin: Skin is warm and dry.  Psychiatric: She has a normal mood and affect. Her behavior is normal. Judgment and thought content normal.    Laboratory Data:  Results for orders placed or performed during the hospital encounter of 04/20/18 (from the past 72 hour(s))  Urinalysis, Routine w reflex microscopic     Status: Abnormal   Collection Time: 04/20/18  1:50 PM  Result Value Ref Range   Color, Urine YELLOW YELLOW   APPearance TURBID (A) CLEAR   Specific Gravity, Urine 1.021 1.005 - 1.030   pH 5.0 5.0 - 8.0   Glucose, UA NEGATIVE NEGATIVE mg/dL   Hgb urine dipstick LARGE (A) NEGATIVE   Bilirubin Urine NEGATIVE NEGATIVE   Ketones, ur NEGATIVE NEGATIVE mg/dL   Protein, ur 100 (A) NEGATIVE mg/dL   Nitrite NEGATIVE NEGATIVE   Leukocytes,Ua MODERATE (A) NEGATIVE   RBC / HPF >50 (H) 0 - 5 RBC/hpf   WBC, UA >50 (H) 0 - 5 WBC/hpf   Bacteria, UA FEW (A) NONE SEEN   Squamous Epithelial / LPF >50 (H) 0 - 5   WBC Clumps PRESENT    Non Squamous Epithelial 0-5 (A) NONE SEEN    Comment: Performed at [facility], [address] [place], [place] [address]  Urine culture     Status: None (Preliminary result)   Collection Time: 04/20/18  1:50 PM  Result Value Ref Range   Specimen Description      URINE, CATHETERIZED Performed at [facility], [address], [place], [place] [address]    Special Requests      NONE Performed at [facility], [address], [place], [place] [address]    Culture      CULTURE REINCUBATED FOR BETTER GROWTH Performed at [facility], [place] [address], [place],  [address]    Report Status PENDING   Comprehensive metabolic panel     Status: Abnormal   Collection Time: 04/20/18  1:50 PM  Result Value Ref Range   Sodium 140 135 - 145 mmol/L   Potassium 4.9 3.5 - 5.1 mmol/L   Chloride 110 98 - 111 mmol/L   CO2 20 (L) 22 - 32 mmol/L   Glucose, Bld 128 (H) 70 - 99 mg/dL   BUN 55 (H) 8 - 23 mg/dL   Creatinine, Ser 4.83 (H) 0.44 - 1.00 mg/dL   Calcium 8.6 (L) 8.9 - 10.3 mg/dL   Total Protein 6.5 6.5 - 8.1 g/dL   Albumin 3.5 3.5 - 5.0 g/dL   AST 11 (L) 15 - 41 U/L   ALT 10 0 - 44 U/L   Alkaline Phosphatase 75 38 - 126 U/L   Total Bilirubin 0.8 0.3 -  1.2 mg/dL   GFR calc non Af Amer 9 (L) >60 mL/min   GFR calc Af Amer 10 (L) >60 mL/min   Anion gap 10 5 - 15    Comment: Performed at [facility], [address] [place], [place] [address]  Troponin I - Once     Status: None   Collection Time: 04/20/18  1:50 PM  Result Value Ref Range   Troponin I <0.03 <0.03 ng/mL    Comment: Performed at [facility], [address] [place], [place] [address]  Lactic acid, plasma     Status: None   Collection Time: 04/20/18  1:50 PM  Result Value Ref Range   Lactic Acid, Venous 0.9 0.5 - 1.9 mmol/L    Comment: Performed at [facility], [address] [place], [place] [address]  CBC with Differential     Status: Abnormal   Collection Time: 04/20/18  1:50 PM  Result Value Ref Range   WBC 8.9 4.0 - 10.5 K/uL   RBC 3.86 (L) 3.87 - 5.11 MIL/uL   Hemoglobin 11.5 (L) 12.0 - 15.0 g/dL   HCT 36.7 36.0 - 46.0 %   MCV 95.1 80.0 - 100.0 fL   MCH 29.8 26.0 - 34.0 pg   MCHC 31.3 30.0 - 36.0 g/dL   RDW 13.6 11.5 - 15.5 %   Platelets 168 150 - 400 K/uL   nRBC 0.0 0.0 - 0.2 %   Neutrophils Relative % 72 %   Neutro Abs 6.4 1.7 - 7.7 K/uL   Lymphocytes Relative 15 %   Lymphs Abs 1.4 0.7 - 4.0 K/uL    Monocytes Relative 12 %   Monocytes Absolute 1.1 (H) 0.1 - 1.0 K/uL   Eosinophils Relative 0 %   Eosinophils Absolute 0.0 0.0 - 0.5 K/uL   Basophils Relative 1 %   Basophils Absolute 0.1 0.0 - 0.1 K/uL   Immature Granulocytes 0 %   Abs Immature Granulocytes 0.04 0.00 - 0.07 K/uL    Comment: Performed at [facility], [address] [place], [place] [address]  Protime-INR     Status: None   Collection Time: 04/20/18  1:50 PM  Result Value Ref Range   Prothrombin Time 13.8 11.4 - 15.2 seconds   INR 1.1 0.8 - 1.2    Comment: (NOTE) INR goal varies based on device and disease states. Performed at [facility], [address] [place], [place] [address]   Magnesium     Status: Abnormal   Collection Time: 04/20/18  1:57 PM  Result Value Ref Range   Magnesium 1.4 (L) 1.7 - 2.4 mg/dL    Comment: Performed at [facility], [address] [place], [place] [address]  Lactic acid, plasma     Status: None   Collection Time: 04/20/18  3:48 PM  Result Value Ref Range   Lactic Acid, Venous 0.8 0.5 - 1.9 mmol/L    Comment: Performed at [facility], [address], [place], [place] [address]  Culture, blood (routine x 2)     Status: None (Preliminary result)   Collection Time: 04/20/18  5:11 PM  Result Value Ref Range   Specimen Description LEFT ANTECUBITAL    Special Requests      BOTTLES DRAWN AEROBIC AND ANAEROBIC Blood Culture adequate volume   Culture      NO GROWTH 2 DAYS Performed at [facility], [address] [place],  [address]    Report Status PENDING   Culture, blood (routine x 2)  Status: None (Preliminary result)   Collection Time: 04/20/18  5:15 PM  Result Value Ref Range   Specimen Description      PORTA CATH Performed at [facility], [address], [place], [place] [address]    Special Requests      BOTTLES DRAWN AEROBIC AND ANAEROBIC Blood Culture adequate volume Performed at [facility], [address], [place], [place] [address]    Culture  Setup Time       GRAM POSITIVE COCCI IN BOTH AEROBIC AND ANAEROBIC BOTTLES Gram Stain Report Called to,Read Back By and Verified With: COX,D AT 4270 ON 4.14.20 BY ISLEY,B Performed at [place], READ BACK BY AND VERIFIED WITH: R PATE RN 04/21/18 1948 JDW Performed at [facility], [place] [address], [place], [place] [address]    Culture GRAM POSITIVE COCCI    Report Status PENDING   Blood Culture ID Panel (Reflexed)     Status: Abnormal   Collection Time: 04/20/18  5:15 PM  Result Value Ref Range   Enterococcus species NOT DETECTED NOT DETECTED   Listeria monocytogenes NOT DETECTED NOT DETECTED   Staphylococcus species DETECTED (A) NOT DETECTED    Comment: Methicillin (oxacillin) resistant coagulase negative staphylococcus. Possible blood culture contaminant (unless isolated from more than one blood culture draw or clinical case suggests pathogenicity). No antibiotic treatment is indicated for blood  culture contaminants. CRITICAL RESULT CALLED TO, READ BACK BY AND VERIFIED WITH: R PATE RN 04/21/18 1948 JDW    Staphylococcus aureus (BCID) NOT DETECTED NOT DETECTED   Methicillin resistance DETECTED (A) NOT DETECTED    Comment: CRITICAL RESULT CALLED TO, READ BACK BY AND VERIFIED WITH: R PATE RN 04/21/18 1948 JDW    Streptococcus species NOT DETECTED NOT DETECTED   Streptococcus agalactiae NOT DETECTED NOT DETECTED   Streptococcus pneumoniae NOT DETECTED NOT DETECTED   Streptococcus pyogenes NOT DETECTED NOT DETECTED   Acinetobacter baumannii NOT DETECTED NOT DETECTED   Enterobacteriaceae species NOT DETECTED NOT DETECTED   Enterobacter cloacae complex NOT DETECTED NOT DETECTED   Escherichia coli NOT DETECTED NOT DETECTED   Klebsiella oxytoca NOT DETECTED NOT DETECTED   Klebsiella pneumoniae NOT DETECTED NOT DETECTED   Proteus species NOT DETECTED NOT DETECTED   Serratia marcescens NOT DETECTED NOT DETECTED   Haemophilus influenzae NOT DETECTED NOT DETECTED    Neisseria meningitidis NOT DETECTED NOT DETECTED   Pseudomonas aeruginosa NOT DETECTED NOT DETECTED   Candida albicans NOT DETECTED NOT DETECTED   Candida glabrata NOT DETECTED NOT DETECTED   Candida krusei NOT DETECTED NOT DETECTED   Candida parapsilosis NOT DETECTED NOT DETECTED   Candida tropicalis NOT DETECTED NOT DETECTED    Comment: Performed at [place] [address] [place], [place] [address]  HIV antibody (Routine Testing)     Status: None   Collection Time: 04/21/18  5:04 AM  Result Value Ref Range   HIV Screen 4th Generation wRfx Non Reactive Non Reactive    Comment: (NOTE) Performed At: [facility] [place], [place] [id number] Rush Farmer MD [id number]   TSH     Status: None   Collection Time: 04/21/18  5:04 AM  Result Value Ref Range   TSH 3.281 0.350 - 4.500 uIU/mL    Comment: Performed by a 3rd Generation assay with a functional sensitivity of <=0.01 uIU/mL. Performed at [facility], [address], [place], [place] [address]   Magnesium     Status: None  Collection Time: 04/21/18  5:04 AM  Result Value Ref Range   Magnesium 1.9 1.7 - 2.4 mg/dL    Comment: Performed at [facility], [address], [place], [place] [address]  Basic metabolic panel     Status: Abnormal   Collection Time: 04/21/18  5:04 AM  Result Value Ref Range   Sodium 139 135 - 145 mmol/L   Potassium 5.0 3.5 - 5.1 mmol/L   Chloride 110 98 - 111 mmol/L   CO2 19 (L) 22 - 32 mmol/L   Glucose, Bld 104 (H) 70 - 99 mg/dL   BUN 51 (H) 8 - 23 mg/dL   Creatinine, Ser 4.85 (H) 0.44 - 1.00 mg/dL   Calcium 8.0 (L) 8.9 - 10.3 mg/dL   GFR calc non Af Amer 9 (L) >60 mL/min   GFR calc Af Amer 10 (L) >60 mL/min   Anion gap 10 5 - 15    Comment: Performed at [facility], [address] [place], [place] [address]  CBC     Status: Abnormal   Collection Time: 04/21/18  5:04 AM  Result Value Ref Range   WBC 7.3 4.0 - 10.5 K/uL   RBC 3.60 (L) 3.87 - 5.11 MIL/uL    Hemoglobin 10.7 (L) 12.0 - 15.0 g/dL   HCT 34.8 (L) 36.0 - 46.0 %   MCV 96.7 80.0 - 100.0 fL   MCH 29.7 26.0 - 34.0 pg   MCHC 30.7 30.0 - 36.0 g/dL   RDW 13.5 11.5 - 15.5 %   Platelets 181 150 - 400 K/uL   nRBC 0.0 0.0 - 0.2 %    Comment: Performed at [facility], [address] [place], [place] [address]  Creatinine, serum     Status: Abnormal   Collection Time: 04/22/18  5:29 AM  Result Value Ref Range   Creatinine, Ser 4.53 (H) 0.44 - 1.00 mg/dL   GFR calc non Af Amer 10 (L) >60 mL/min   GFR calc Af Amer 11 (L) >60 mL/min    Comment: Performed at [facility], [address], [place], [place] [address]   Recent Results (from the past 240 hour(s))  Urine culture     Status: None (Preliminary result)   Collection Time: 04/20/18  1:50 PM  Result Value Ref Range Status   Specimen Description   Final    URINE, CATHETERIZED Performed at [facility], [address], [place], [place] [address]    Special Requests   Final    NONE Performed at [facility], [address], [place], [place] [address]    Culture   Final    CULTURE REINCUBATED FOR BETTER GROWTH Performed at [facility], [place] [address], [place], [place] [address]    Report Status PENDING  Incomplete  Culture, blood (routine x 2)     Status: None (Preliminary result)   Collection Time: 04/20/18  5:11 PM  Result Value Ref Range Status   Specimen Description LEFT ANTECUBITAL  Final   Special Requests   Final    BOTTLES DRAWN AEROBIC AND ANAEROBIC Blood Culture adequate volume   Culture   Final    NO GROWTH 2 DAYS Performed at [facility], [address], [place],  [address]    Report Status PENDING  Incomplete  Culture, blood (routine x 2)     Status: None (Preliminary result)   Collection Time: 04/20/18  5:15 PM  Result Value Ref Range Status   Specimen Description   Final  PORTA CATH Performed at [facility], [address], [place], [place] [address]    Special Requests   Final    BOTTLES  DRAWN AEROBIC AND ANAEROBIC Blood Culture adequate volume Performed at [facility], [address], [place], [place] [address]    Culture  Setup Time   Final    GRAM POSITIVE COCCI IN BOTH AEROBIC AND ANAEROBIC BOTTLES Gram Stain Report Called to,Read Back By and Verified With: COX,D AT 7322 ON 4.14.20 BY ISLEY,B Performed at [place] BY AND VERIFIED WITH: R PATE RN 04/21/18 1948 JDW Performed at [facility], [place] [address], [place], [place] [address]    Culture GRAM POSITIVE COCCI  Final   Report Status PENDING  Incomplete  Blood Culture ID Panel (Reflexed)     Status: Abnormal   Collection Time: 04/20/18  5:15 PM  Result Value Ref Range Status   Enterococcus species NOT DETECTED NOT DETECTED Final   Listeria monocytogenes NOT DETECTED NOT DETECTED Final   Staphylococcus species DETECTED (A) NOT DETECTED Final    Comment: Methicillin (oxacillin) resistant coagulase negative staphylococcus. Possible blood culture contaminant (unless isolated from more than one blood culture draw or clinical case suggests pathogenicity). No antibiotic treatment is indicated for blood  culture contaminants. CRITICAL RESULT CALLED TO, READ BACK BY AND VERIFIED WITH: R PATE RN 04/21/18 1948 JDW    Staphylococcus aureus (BCID) NOT DETECTED NOT DETECTED Final   Methicillin resistance DETECTED (A) NOT DETECTED Final    Comment: CRITICAL RESULT CALLED TO, READ BACK BY AND VERIFIED WITH: R PATE RN 04/21/18 1948 JDW    Streptococcus species NOT DETECTED NOT DETECTED Final   Streptococcus agalactiae NOT DETECTED NOT DETECTED Final   Streptococcus pneumoniae NOT DETECTED NOT DETECTED Final   Streptococcus pyogenes NOT DETECTED NOT DETECTED Final   Acinetobacter baumannii NOT DETECTED NOT DETECTED Final   Enterobacteriaceae species NOT DETECTED NOT DETECTED Final   Enterobacter cloacae complex NOT DETECTED NOT DETECTED Final   Escherichia coli NOT DETECTED  NOT DETECTED Final   Klebsiella oxytoca NOT DETECTED NOT DETECTED Final   Klebsiella pneumoniae NOT DETECTED NOT DETECTED Final   Proteus species NOT DETECTED NOT DETECTED Final   Serratia marcescens NOT DETECTED NOT DETECTED Final   Haemophilus influenzae NOT DETECTED NOT DETECTED Final   Neisseria meningitidis NOT DETECTED NOT DETECTED Final   Pseudomonas aeruginosa NOT DETECTED NOT DETECTED Final   Candida albicans NOT DETECTED NOT DETECTED Final   Candida glabrata NOT DETECTED NOT DETECTED Final   Candida krusei NOT DETECTED NOT DETECTED Final   Candida parapsilosis NOT DETECTED NOT DETECTED Final   Candida tropicalis NOT DETECTED NOT DETECTED Final    Comment: Performed at [facility], [place]. [address] [place], [place] [address]   Creatinine: Recent Labs    04/20/18 1350 04/21/18 0504 04/22/18 0529  CREATININE 4.83* 4.85* 4.53*   Baseline Creatinine: 1.3  Impression/Assessment:  67yo with right ureteral calculus, sepsis from a urinary source, acute renal failure  Plan:  1. The risks/benefits/alternatives to right ureteral stent placement was explained to the patient and legal guardian and they understand and wish to proceed with surgery. She will be taken urgently to the OR for right ureteral stent placement  [name] 04/22/2018, 1:56 PM

## 2018-04-22 NOTE — Transfer of Care (Signed)
Immediate Anesthesia Transfer of Care Note  Patient: Rachel Morgan  Procedure(s) Performed: CYSTOSCOPY WITH RETROGRADE PYELOGRAM/URETERAL STENT PLACEMENT (Right )  Patient Location: PACU  Anesthesia Type:General  Level of Consciousness: awake and patient cooperative  Airway & Oxygen Therapy: Patient Spontanous Breathing  Post-op Assessment: Report given to RN and Post -op Vital signs reviewed and stable  Post vital signs: Reviewed and stable  Last Vitals:  Vitals Value Taken Time  BP 120/53 04/22/2018  3:45 PM  Temp    Pulse 75 04/22/2018  3:51 PM  Resp 19 04/22/2018  3:51 PM  SpO2 96 % 04/22/2018  3:51 PM  Vitals shown include unvalidated device data.  Last Pain:  Vitals:   04/22/18 1434  TempSrc:   PainSc: 8       Patients Stated Pain Goal: 0 (16/10/96 0454)  Complications: No apparent anesthesia complications

## 2018-04-22 NOTE — Anesthesia Preprocedure Evaluation (Signed)
Anesthesia Evaluation  Patient identified by MRN, date of birth, ID band Patient awake    Reviewed: Allergy & Precautions, NPO status , Patient's Chart, lab work & pertinent test results  Airway Mallampati: II  TM Distance: >3 FB Neck ROM: Full    Dental no notable dental hx. (+) Edentulous Upper, Edentulous Lower   Pulmonary neg pulmonary ROS,    Pulmonary exam normal breath sounds clear to auscultation       Cardiovascular Exercise Tolerance: Good hypertension, negative cardio ROS Normal cardiovascular examI Rhythm:Regular Rate:Normal     Neuro/Psych Anxiety Depression negative neurological ROS  negative psych ROS   GI/Hepatic Neg liver ROS, GERD  ,  Endo/Other  diabetesHypothyroidism   Renal/GU ARF and CRFRenal disease  negative genitourinary   Musculoskeletal negative musculoskeletal ROS (+)   Abdominal   Peds negative pediatric ROS (+)  Hematology negative hematology ROS (+)   Anesthesia Other Findings   Reproductive/Obstetrics negative OB ROS                             Anesthesia Physical Anesthesia Plan  ASA: IV and emergent  Anesthesia Plan: General   Post-op Pain Management:    Induction: Intravenous  PONV Risk Score and Plan:   Airway Management Planned: Oral ETT  Additional Equipment:   Intra-op Plan:   Post-operative Plan: Extubation in OR  Informed Consent: I have reviewed the patients History and Physical, chart, labs and discussed the procedure including the risks, benefits and alternatives for the proposed anesthesia with the patient or authorized representative who has indicated his/her understanding and acceptance.     Dental advisory given  Plan Discussed with: CRNA  Anesthesia Plan Comments: (Full PPE planned  Phone consent from Daughter/Witnessed by [name] RN Full Stomach  Deemed Emergent  )        Anesthesia Quick Evaluation

## 2018-04-22 NOTE — Progress Notes (Signed)
PROGRESS NOTE    Rachel Morgan  LPF:[id number]  DOB: [birth year]  DOA: 04/20/2018 PCP: [facility]   Brief Admission Hx: 68 year old female with cerebral palsy with cognitive developmental delay, hypothyroidism, hypertension, stage III CKD with reported baseline creatinine 1.4-1.8 admitted with acute metabolic encephalopathy.  MDM/Assessment & Plan:   1. Acute metabolic encephalopathy- multifactorial likely secondary to worsening renal function, urinary tract infection and toxic effects of her psychiatric medications in the setting of renal failure.  Continue supportive therapy.  Treat underlying conditions. 2. Urinary tract infection with cystitis- patient has been started on ceftriaxone pending urine culture results.  Blood culture, 1 out of 2 positive for gram-positive cocci.  Likely contaminant from Port-A-Cath..  Continue IV fluids for hydration.  She was briefly placed on vancomycin, but this is been since been discontinued 3. Acute kidney injury on CKD stage III- renal function testing from 2018 reveals a baseline creatinine of 1.4-1.8.  Her creatinine has now increased to 4.83.  She was noted to have some bladder outlet obstruction that was relieved with in and out catheterization.  We will monitor her renal function closely.  Nephrology following renally dose medications as appropriate and hold nonessential medications.  Renal ultrasound shows right-sided hydronephrosis with possible stone.  CT abdomen renal stone study has been ordered.  Urology consulted. 4. Hypothyroidism-resume home levothyroxine. 5. Essential hypertension-holding home spironolactone. 6. Cerebral palsy with cognitive developmental delay- resume home Zyprexa and Thorazine as sister reports that she takes these daily for maintenance to avoid behavioral issues.  Reduced doses as necessary. 7. GERD-Protonix ordered for GI protection. 8. Prolonged QTC- she was noted to have some  hypomagnesemia which is being treated.  Monitor lytes, daily EKGs, telemetry monitoring.  9. Acute urinary retention- catheterize as needed if unable to void.  Will monitor throughout the day her urine output.  I spoke with her nurse who verbalized understanding.  DVT prophylaxis: Heparin Code Status: Full Family Communication: Discussed with sister at the bedside Disposition Plan: Inpatient   Consultants:  Nephrology  Urology  Procedures:    Antimicrobials:  Ceftriaxone 04/20/2018>  Subjective: She does not appear to be in distress.  Sister is at bedside and feels that she is approaching baseline in terms of her mental status  Objective: Vitals:   04/22/18 1434 04/22/18 1436 04/22/18 1532 04/22/18 1545  BP:  (!) 142/64 (!) 140/49 (!) 120/53  Pulse:   82 78  Resp:   (!) 22 (!) 22  Temp:   98.6 F (37 C)   TempSrc:      SpO2:   100% 100%  Weight:      Height: 4\' 11"  (1.499 m)       Intake/Output Summary (Last 24 hours) at 04/22/2018 1848 Last data filed at 04/22/2018 1600 Gross per 24 hour  Intake 1238.36 ml  Output 1100 ml  Net 138.36 ml   Filed Weights   04/20/18 1809 04/21/18 0500 04/22/18 0500  Weight: 122.5 kg 125.4 kg 130.2 kg     REVIEW OF SYSTEMS  Unable to obtain due to cognitive developmental delay  Exam:  General exam: Alert, awake, nonverbal Respiratory system: Clear to auscultation. Respiratory effort normal. Cardiovascular system:RRR. No murmurs, rubs, gallops. Gastrointestinal system: Abdomen is obese, soft and nontender. No organomegaly or masses felt. Normal bowel sounds heard. Central nervous system: No focal neurological deficits. Extremities: No C/C/E, +pedal pulses Skin: No rashes, lesions or ulcers Psychiatry: Nonverbal, does not appear to be agitated.  Data Reviewed: Basic Metabolic Panel: Recent Labs  Lab 04/20/18 1350 04/20/18 1357 04/21/18 0504 04/22/18 0529  NA 140  --  139  --   K 4.9  --  5.0  --   CL 110  --   110  --   CO2 20*  --  19*  --   GLUCOSE 128*  --  104*  --   BUN 55*  --  51*  --   CREATININE 4.83*  --  4.85* 4.53*  CALCIUM 8.6*  --  8.0*  --   MG  --  1.4* 1.9  --    Liver Function Tests: Recent Labs  Lab 04/20/18 1350  AST 11*  ALT 10  ALKPHOS 75  BILITOT 0.8  PROT 6.5  ALBUMIN 3.5   No results for input(s): LIPASE, AMYLASE in the last 168 hours. No results for input(s): AMMONIA in the last 168 hours. CBC: Recent Labs  Lab 04/20/18 1350 04/21/18 0504  WBC 8.9 7.3  NEUTROABS 6.4  --   HGB 11.5* 10.7*  HCT 36.7 34.8*  MCV 95.1 96.7  PLT 168 181   Cardiac Enzymes: Recent Labs  Lab 04/20/18 1350  TROPONINI <0.03   CBG (last 3)  No results for input(s): GLUCAP in the last 72 hours. Recent Results (from the past 240 hour(s))  Urine culture     Status: None (Preliminary result)   Collection Time: 04/20/18  1:50 PM  Result Value Ref Range Status   Specimen Description   Final    URINE, CATHETERIZED Performed at [facility], [address], [place], [place] [address]    Special Requests   Final    NONE Performed at [facility], [address], [place], [place] [address]    Culture   Final    CULTURE REINCUBATED FOR BETTER GROWTH Performed at [facility], [place] [address], [place], [place] [address]    Report Status PENDING  Incomplete  Culture, blood (routine x 2)     Status: None (Preliminary result)   Collection Time: 04/20/18  5:11 PM  Result Value Ref Range Status   Specimen Description LEFT ANTECUBITAL  Final   Special Requests   Final    BOTTLES DRAWN AEROBIC AND ANAEROBIC Blood Culture adequate volume   Culture   Final    NO GROWTH 2 DAYS Performed at [facility], [address] [place], [place] [address]    Report Status PENDING  Incomplete  Culture, blood (routine x 2)     Status: Abnormal (Preliminary result)   Collection Time: 04/20/18  5:15 PM  Result Value Ref Range Status   Specimen Description   Final    PORTA  CATH Performed at [facility], [address], [place], [place] [address]    Special Requests   Final    BOTTLES DRAWN AEROBIC AND ANAEROBIC Blood Culture adequate volume Performed at [facility], [address] [place], [place] [address]    Culture  Setup Time   Final    GRAM POSITIVE COCCI IN BOTH AEROBIC AND ANAEROBIC BOTTLES Gram Stain Report Called to,Read Back By and Verified With: COX,D AT 3875 ON 4.14.20 BY ISLEY,B Performed at [place], READ BACK BY AND VERIFIED WITH: R PATE RN 04/21/18 1948 JDW    Culture (A)  Final    STAPHYLOCOCCUS SPECIES (COAGULASE NEGATIVE) THE SIGNIFICANCE OF ISOLATING THIS ORGANISM FROM A SINGLE SET OF BLOOD CULTURES WHEN MULTIPLE SETS ARE DRAWN IS UNCERTAIN.  PLEASE NOTIFY THE MICROBIOLOGY DEPARTMENT WITHIN ONE WEEK IF SPECIATION AND SENSITIVITIES ARE REQUIRED. Performed at [facility], [place] [address], [place], [place] [address]    Report Status PENDING  Incomplete  Blood Culture ID Panel (Reflexed)     Status: Abnormal   Collection Time: 04/20/18  5:15 PM  Result Value Ref Range Status   Enterococcus species NOT DETECTED NOT DETECTED Final   Listeria monocytogenes NOT DETECTED NOT DETECTED Final   Staphylococcus species DETECTED (A) NOT DETECTED Final    Comment: Methicillin (oxacillin) resistant coagulase negative staphylococcus. Possible blood culture contaminant (unless isolated from more than one blood culture draw or clinical case suggests pathogenicity). No antibiotic treatment is indicated for blood  culture contaminants. CRITICAL RESULT CALLED TO, READ BACK BY AND VERIFIED WITH: R PATE RN 04/21/18 1948 JDW    Staphylococcus aureus (BCID) NOT DETECTED NOT DETECTED Final   Methicillin resistance DETECTED (A) NOT DETECTED Final    Comment: CRITICAL RESULT CALLED TO, READ BACK BY AND VERIFIED WITH: R PATE RN 04/21/18 1948 JDW    Streptococcus species NOT DETECTED NOT DETECTED Final   Streptococcus  agalactiae NOT DETECTED NOT DETECTED Final   Streptococcus pneumoniae NOT DETECTED NOT DETECTED Final   Streptococcus pyogenes NOT DETECTED NOT DETECTED Final   Acinetobacter baumannii NOT DETECTED NOT DETECTED Final   Enterobacteriaceae species NOT DETECTED NOT DETECTED Final   Enterobacter cloacae complex NOT DETECTED NOT DETECTED Final   Escherichia coli NOT DETECTED NOT DETECTED Final   Klebsiella oxytoca NOT DETECTED NOT DETECTED Final   Klebsiella pneumoniae NOT DETECTED NOT DETECTED Final   Proteus species NOT DETECTED NOT DETECTED Final   Serratia marcescens NOT DETECTED NOT DETECTED Final   Haemophilus influenzae NOT DETECTED NOT DETECTED Final   Neisseria meningitidis NOT DETECTED NOT DETECTED Final   Pseudomonas aeruginosa NOT DETECTED NOT DETECTED Final   Candida albicans NOT DETECTED NOT DETECTED Final   Candida glabrata NOT DETECTED NOT DETECTED Final   Candida krusei NOT DETECTED NOT DETECTED Final   Candida parapsilosis NOT DETECTED NOT DETECTED Final   Candida tropicalis NOT DETECTED NOT DETECTED Final    Comment: Performed at [facility], [place]. [address] [place], [place] [address]     Studies: US Renal  Result Date: 04/21/2018 CLINICAL DATA:  Acute kidney injury. EXAM: RENAL / URINARY TRACT ULTRASOUND COMPLETE COMPARISON:  Ultrasound of June 21, 2016. FINDINGS: Right Kidney: Renal measurements: 11.9 x 5.8 x 8.1 cm = volume: 297 mL. Moderate hydronephrosis is noted. Possible 14 mm calculus seen in lower pole collecting system. Echogenicity within normal limits. No mass visualized. Left Kidney: Renal measurements: 9.5 x 5.1 x 4.9 cm = volume: 125 mL. Echogenicity within normal limits. No mass or hydronephrosis visualized. Bladder: Not visualized. IMPRESSION: Moderate right hydronephrosis is noted with possible nonobstructive calculus seen in lower pole collecting system of right kidney. Left kidney is unremarkable. Electronically Signed   By: Marijo Conception M.D.   On:  04/21/2018 12:23   Dg C-arm 1-60 Min-no Report  Result Date: 04/22/2018 Fluoroscopy was utilized by the requesting physician.  No radiographic interpretation.   Ct Renal Stone Study  Result Date: 04/22/2018 CLINICAL DATA:  Hydronephrosis EXAM: CT ABDOMEN AND PELVIS WITHOUT CONTRAST TECHNIQUE: Multidetector CT imaging of the abdomen and pelvis was performed following the standard protocol without IV contrast. COMPARISON:  01/21/2016 FINDINGS: Lower chest: No acute abnormality. Hepatobiliary: No focal liver abnormality is seen. No gallstones, gallbladder wall thickening, or biliary dilatation. Pancreas:  No pancreatic mass or peripancreatic inflammatory changes. Fatty atrophy of the pancreas. Spleen: Normal in size without focal abnormality. Adrenals/Urinary Tract: Normal adrenal glands. 9 mm distal right ureteral calculus resulting in severe right hydroureteronephrosis. Bilateral nonobstructing renal calculi. Atrophic left kidney. Normal bladder. Stomach/Bowel: Stomach is within normal limits. No evidence of bowel wall thickening, distention, or inflammatory changes. Vascular/Lymphatic: Abdominal aortic atherosclerosis. No lymphadenopathy. Reproductive: Uterus and bilateral adnexa are unremarkable. Other: No abdominal wall hernia or abnormality. No abdominopelvic ascites. Musculoskeletal: No acute osseous abnormality. No aggressive osseous lesion. Grade 1 anterolisthesis of L4 on L5 secondary to facet disease. Bilateral foraminal stenosis at L4-5. Degenerative disc disease with disc height loss at L3-4, L4-5 and L5-S1. IMPRESSION: 1. 9 mm distal right ureteral calculus resulting in severe right hydroureteronephrosis. 2. Bilateral nonobstructing renal calculi. 3. Atrophic left kidney. 4.  Aortic Atherosclerosis (ICD10-I70.0). Electronically Signed   By: Kathreen Devoid   On: 04/22/2018 14:17     Scheduled Meds: . chlorproMAZINE  25 mg Oral BID  . escitalopram  10 mg Oral QHS  . heparin  5,000 Units  Subcutaneous Q8H  . levothyroxine  50 mcg Oral QAC breakfast  . OLANZapine  20 mg Oral Daily  . pantoprazole  40 mg Oral Daily  . sodium bicarbonate  650 mg Oral BID   Continuous Infusions: . sodium chloride 65 mL/hr at 04/22/18 0213  . cefTRIAXone (ROCEPHIN)  IV 2 g (04/22/18 1758)    Principal Problem:   Acute metabolic encephalopathy Active Problems:   Hypothyroidism   Hypertension   AKI (acute kidney injury) ([place])   UTI (urinary tract infection)   Hypomagnesemia   Time spent:   Kathie Dike, MD [facility] 04/22/2018, 6:48 PM    LOS: 2 days  How to contact the [facility] Attending or Consulting provider [place] or covering provider during after hours [place], for this patient?  1. Check the care team in [facility] and look for a) attending/consulting [facility] provider listed and b) the [facility] team listed 2. Log into [email or web address] and use [place]'s universal password to access. If you do not have the password, please contact the hospital operator. 3. Locate the [facility] provider you are looking for under [facility] and page to a number that you can be directly reached. 4. If you still have difficulty reaching the provider, please page the [facility] (Director on Call) for the Hospitalists listed on amion for assistance.

## 2018-04-23 ENCOUNTER — Encounter (HOSPITAL_COMMUNITY): Payer: Self-pay | Admitting: Urology

## 2018-04-23 LAB — CBC
HCT: 34.6 % — ABNORMAL LOW (ref 36.0–46.0)
Hemoglobin: 10.9 g/dL — ABNORMAL LOW (ref 12.0–15.0)
MCH: 30.3 pg (ref 26.0–34.0)
MCHC: 31.5 g/dL (ref 30.0–36.0)
MCV: 96.1 fL (ref 80.0–100.0)
Platelets: 207 10*3/uL (ref 150–400)
RBC: 3.6 MIL/uL — ABNORMAL LOW (ref 3.87–5.11)
RDW: 13.6 % (ref 11.5–15.5)
WBC: 6.4 10*3/uL (ref 4.0–10.5)
nRBC: 0 % (ref 0.0–0.2)

## 2018-04-23 LAB — CULTURE, BLOOD (ROUTINE X 2): Special Requests: ADEQUATE

## 2018-04-23 LAB — RENAL FUNCTION PANEL
Albumin: 2.9 g/dL — ABNORMAL LOW (ref 3.5–5.0)
Anion gap: 8 (ref 5–15)
BUN: 35 mg/dL — ABNORMAL HIGH (ref 8–23)
CO2: 20 mmol/L — ABNORMAL LOW (ref 22–32)
Calcium: 8.3 mg/dL — ABNORMAL LOW (ref 8.9–10.3)
Chloride: 112 mmol/L — ABNORMAL HIGH (ref 98–111)
Creatinine, Ser: 3.15 mg/dL — ABNORMAL HIGH (ref 0.44–1.00)
GFR calc Af Amer: 17 mL/min — ABNORMAL LOW (ref >60)
GFR calc non Af Amer: 15 mL/min — ABNORMAL LOW (ref >60)
Glucose, Bld: 88 mg/dL (ref 70–99)
Phosphorus: 3.5 mg/dL (ref 2.5–4.6)
Potassium: 5.1 mmol/L (ref 3.5–5.1)
Sodium: 140 mmol/L (ref 135–145)

## 2018-04-23 NOTE — Addendum Note (Signed)
Addendum  created 04/23/18 1037 by Mickel Baas, CRNA   Clinical Note Signed

## 2018-04-23 NOTE — Progress Notes (Signed)
Patient ID: Rachel Morgan, female   DOB: Mar 12, 1950, 68 y.o.   MRN: [id number] S: Pt taken for cystoscopy and double j stent placement with improvement of UOP and Scr. O:BP 96/66 (BP Location: Left Arm)   Pulse 67   Temp 98 F (36.7 C) (Oral)   Resp 18   Ht 4\' 11"  (1.499 m)   Wt 130.5 kg   SpO2 98%   BMI 58.11 kg/m   Intake/Output Summary (Last 24 hours) at 04/23/2018 0941 Last data filed at 04/23/2018 0600 Gross per 24 hour  Intake 1112.91 ml  Output 3600 ml  Net -2487.09 ml   Intake/Output: I/O last 3 completed shifts: In: 2351.3 [P.O.:420; I.V.:1731.3; IV Piggyback:200] Out: 4500 [Urine:4500]  Intake/Output this shift:  No intake/output data recorded. Weight change: 0.3 kg Gen:NAD CVS: no rub Resp: cta MGQ:[id number] Ext: no edema  Recent Labs  Lab 04/20/18 1350 04/21/18 0504 04/22/18 0529 04/23/18 0531  NA 140 139  --  140  K 4.9 5.0  --  5.1  CL 110 110  --  112*  CO2 20* 19*  --  20*  GLUCOSE 128* 104*  --  88  BUN 55* 51*  --  35*  CREATININE 4.83* 4.85* 4.53* 3.15*  ALBUMIN 3.5  --   --  2.9*  CALCIUM 8.6* 8.0*  --  8.3*  PHOS  --   --   --  3.5  AST 11*  --   --   --   ALT 10  --   --   --    Liver Function Tests: Recent Labs  Lab 04/20/18 1350 04/23/18 0531  AST 11*  --   ALT 10  --   ALKPHOS 75  --   BILITOT 0.8  --   PROT 6.5  --   ALBUMIN 3.5 2.9*   No results for input(s): LIPASE, AMYLASE in the last 168 hours. No results for input(s): AMMONIA in the last 168 hours. CBC: Recent Labs  Lab 04/20/18 1350 04/21/18 0504 04/23/18 0531  WBC 8.9 7.3 6.4  NEUTROABS 6.4  --   --   HGB 11.5* 10.7* 10.9*  HCT 36.7 34.8* 34.6*  MCV 95.1 96.7 96.1  PLT 168 181 207   Cardiac Enzymes: Recent Labs  Lab 04/20/18 1350  TROPONINI <0.03   CBG: No results for input(s): GLUCAP in the last 168 hours.  Iron Studies: No results for input(s): IRON, TIBC, TRANSFERRIN, FERRITIN in the last 72 hours. Studies/Results: US Renal  Result Date:  04/21/2018 CLINICAL DATA:  Acute kidney injury. EXAM: RENAL / URINARY TRACT ULTRASOUND COMPLETE COMPARISON:  Ultrasound of June 21, 2016. FINDINGS: Right Kidney: Renal measurements: 11.9 x 5.8 x 8.1 cm = volume: 297 mL. Moderate hydronephrosis is noted. Possible 14 mm calculus seen in lower pole collecting system. Echogenicity within normal limits. No mass visualized. Left Kidney: Renal measurements: 9.5 x 5.1 x 4.9 cm = volume: 125 mL. Echogenicity within normal limits. No mass or hydronephrosis visualized. Bladder: Not visualized. IMPRESSION: Moderate right hydronephrosis is noted with possible nonobstructive calculus seen in lower pole collecting system of right kidney. Left kidney is unremarkable. Electronically Signed   By: Marijo Conception M.D.   On: 04/21/2018 12:23   Dg C-arm 1-60 Min-no Report  Result Date: 04/22/2018 Fluoroscopy was utilized by the requesting physician.  No radiographic interpretation.   Ct Renal Stone Study  Result Date: 04/22/2018 CLINICAL DATA:  Hydronephrosis EXAM: CT ABDOMEN AND PELVIS WITHOUT CONTRAST TECHNIQUE: Multidetector CT imaging  of the abdomen and pelvis was performed following the standard protocol without IV contrast. COMPARISON:  01/21/2016 FINDINGS: Lower chest: No acute abnormality. Hepatobiliary: No focal liver abnormality is seen. No gallstones, gallbladder wall thickening, or biliary dilatation. Pancreas: No pancreatic mass or peripancreatic inflammatory changes. Fatty atrophy of the pancreas. Spleen: Normal in size without focal abnormality. Adrenals/Urinary Tract: Normal adrenal glands. 9 mm distal right ureteral calculus resulting in severe right hydroureteronephrosis. Bilateral nonobstructing renal calculi. Atrophic left kidney. Normal bladder. Stomach/Bowel: Stomach is within normal limits. No evidence of bowel wall thickening, distention, or inflammatory changes. Vascular/Lymphatic: Abdominal aortic atherosclerosis. No lymphadenopathy. Reproductive:  Uterus and bilateral adnexa are unremarkable. Other: No abdominal wall hernia or abnormality. No abdominopelvic ascites. Musculoskeletal: No acute osseous abnormality. No aggressive osseous lesion. Grade 1 anterolisthesis of L4 on L5 secondary to facet disease. Bilateral foraminal stenosis at L4-5. Degenerative disc disease with disc height loss at L3-4, L4-5 and L5-S1. IMPRESSION: 1. 9 mm distal right ureteral calculus resulting in severe right hydroureteronephrosis. 2. Bilateral nonobstructing renal calculi. 3. Atrophic left kidney. 4.  Aortic Atherosclerosis (ICD10-I70.0). Electronically Signed   By: Kathreen Devoid   On: 04/22/2018 14:17   . chlorproMAZINE  25 mg Oral BID  . escitalopram  10 mg Oral QHS  . heparin  5,000 Units Subcutaneous Q8H  . levothyroxine  50 mcg Oral QAC breakfast  . OLANZapine  20 mg Oral Daily  . pantoprazole  40 mg Oral Daily  . sodium bicarbonate  650 mg Oral BID    BMET    Component Value Date/Time   NA 140 04/23/2018 0531   K 5.1 04/23/2018 0531   CL 112 (H) 04/23/2018 0531   CO2 20 (L) 04/23/2018 0531   GLUCOSE 88 04/23/2018 0531   BUN 35 (H) 04/23/2018 0531   CREATININE 3.15 (H) 04/23/2018 0531   CALCIUM 8.3 (L) 04/23/2018 0531   GFRNONAA 15 (L) 04/23/2018 0531   GFRAA 17 (L) 04/23/2018 0531   CBC    Component Value Date/Time   WBC 6.4 04/23/2018 0531   RBC 3.60 (L) 04/23/2018 0531   HGB 10.9 (L) 04/23/2018 0531   HCT 34.6 (L) 04/23/2018 0531   PLT 207 04/23/2018 0531   MCV 96.1 04/23/2018 0531   MCH 30.3 04/23/2018 0531   MCHC 31.5 04/23/2018 0531   RDW 13.6 04/23/2018 0531   LYMPHSABS 1.4 04/20/2018 1350   MONOABS 1.1 (H) 04/20/2018 1350   EOSABS 0.0 04/20/2018 1350   BASOSABS 0.1 04/20/2018 1350     Assessment/Plan:  1. AKI/CKD stage 3- due to obstructive uropathy.  Marked improvement of Cr and UOP following stent placement by Urology.  Appreciate Dr. Noland Fordyce assistance and care.  Continue to follow UOP and Scr.  Baseline Cr 1.4-1.8.   Peaked at 4.85 and now down to 3.15 today.   2. Obstructive uropathy- s/p cystoscopy and stone retrieval s/p double J stent. 3. HTN- low BP, holding aldactone 4. Hyperkalemia- stable and should improve now that obstruction has been removed. 5. Metabolic acidosis- continue with sodium bicarb 6. AMS- multifactorial and now improving.  Donetta Potts, MD [employer] [phone]

## 2018-04-23 NOTE — Anesthesia Postprocedure Evaluation (Signed)
Anesthesia Post Note  Patient: Rachel Morgan  Procedure(s) Performed: CYSTOSCOPY WITH RETROGRADE PYELOGRAM/URETERAL STENT PLACEMENT (Right )  Patient location during evaluation: Nursing Unit Anesthesia Type: General Level of consciousness: awake and alert (Patient at baseline) Pain management: pain level controlled Vital Signs Assessment: post-procedure vital signs reviewed and stable Respiratory status: spontaneous breathing Cardiovascular status: stable Postop Assessment: no apparent nausea or vomiting Anesthetic complications: no     Last Vitals:  Vitals:   04/22/18 2144 04/23/18 0508  BP: (!) 113/52 96/66  Pulse: (!) 54 67  Resp: 16 18  Temp: 36.8 C 36.7 C  SpO2: 97% 98%    Last Pain:  Vitals:   04/23/18 0508  TempSrc: Oral  PainSc:                  ADAMS, AMY A

## 2018-04-23 NOTE — Progress Notes (Signed)
PROGRESS NOTE    Rachel Morgan  BPZ:[id number]  DOB: [birth year]  DOA: 04/20/2018 PCP: [facility]   Brief Admission Hx: 68 year old female with cerebral palsy with cognitive developmental delay, hypothyroidism, hypertension, stage III CKD with reported baseline creatinine 1.4-1.8 admitted with acute metabolic encephalopathy.  MDM/Assessment & Plan:   1. Acute metabolic encephalopathy- multifactorial likely secondary to worsening renal function, urinary tract infection and toxic effects of her psychiatric medications in the setting of renal failure.  Continue supportive therapy.  Treat underlying conditions. 2. Urinary tract infection with cystitis- patient has been started on ceftriaxone pending urine culture results.  Blood culture, 1 out of 2 positive for gram-positive cocci.  Likely contaminant from Port-A-Cath..  Continue IV fluids for hydration.  She was briefly placed on vancomycin, but this is been since been discontinued 3. Acute kidney injury on CKD stage III- renal function testing from 2018 reveals a baseline creatinine of 1.4-1.8.  Creatinine had increased to 4.83.  She was noted to have some bladder outlet obstruction that was relieved with in and out catheterization. Nephrology following renally dose medications as appropriate and hold nonessential medications.  Renal ultrasound shows right-sided hydronephrosis with possible stone.  CT renal stone study confirmed right hydronephrosis with obstructing stone.  Seen by urology and underwent double-J stent placement.  Follow-up creatinine has improved today.  Continue to monitor. 4. Hypothyroidism-resume home levothyroxine. 5. Essential hypertension-holding home spironolactone. 6. Cerebral palsy with cognitive developmental delay- resume home Zyprexa and Thorazine as sister reports that she takes these daily for maintenance to avoid behavioral issues.  Reduced doses as necessary. 7. GERD-Protonix ordered  for GI protection.  DVT prophylaxis: Heparin Code Status: Full Family Communication: Discussed with sister over the phone Disposition Plan: Inpatient   Consultants:  Nephrology  Urology  Procedures:  Right double-J stent placement for hydronephrosis  Antimicrobials:  Ceftriaxone 04/20/2018>  Subjective: She does not appear to be in distress.  Mental status improving.  Objective: Vitals:   04/22/18 2144 04/23/18 0415 04/23/18 0508 04/23/18 1417  BP: (!) 113/52  96/66 (!) 114/50  Pulse: (!) 54  67 73  Resp: 16  18   Temp: 98.3 F (36.8 C)  98 F (36.7 C) 97.6 F (36.4 C)  TempSrc: Oral  Oral Oral  SpO2: 97%  98% (!) 87%  Weight:  130.5 kg    Height:        Intake/Output Summary (Last 24 hours) at 04/23/2018 1926 Last data filed at 04/23/2018 1800 Gross per 24 hour  Intake 2498.5 ml  Output 5400 ml  Net -2901.5 ml   Filed Weights   04/21/18 0500 04/22/18 0500 04/23/18 0415  Weight: 125.4 kg 130.2 kg 130.5 kg     REVIEW OF SYSTEMS  Unable to obtain due to cognitive developmental delay  Exam:  General exam: Alert, awake, nonverbal Respiratory system: Clear to auscultation. Respiratory effort normal. Cardiovascular system:RRR. No murmurs, rubs, gallops. Gastrointestinal system: Abdomen is nondistended, soft and nontender. No organomegaly or masses felt. Normal bowel sounds heard. Central nervous system: No focal neurological deficits. Extremities: No C/C/E, +pedal pulses Skin: No rashes, lesions or ulcers Psychiatry: Awake, nonverbal    Data Reviewed: Basic Metabolic Panel: Recent Labs  Lab 04/20/18 1350 04/20/18 1357 04/21/18 0504 04/22/18 0529 04/23/18 0531  NA 140  --  139  --  140  K 4.9  --  5.0  --  5.1  CL 110  --  110  --  112*  CO2 20*  --  19*  --  20*  GLUCOSE 128*  --  104*  --  88  BUN 55*  --  51*  --  35*  CREATININE 4.83*  --  4.85* 4.53* 3.15*  CALCIUM 8.6*  --  8.0*  --  8.3*  MG  --  1.4* 1.9  --   --   PHOS  --   --    --   --  3.5   Liver Function Tests: Recent Labs  Lab 04/20/18 1350 04/23/18 0531  AST 11*  --   ALT 10  --   ALKPHOS 75  --   BILITOT 0.8  --   PROT 6.5  --   ALBUMIN 3.5 2.9*   No results for input(s): LIPASE, AMYLASE in the last 168 hours. No results for input(s): AMMONIA in the last 168 hours. CBC: Recent Labs  Lab 04/20/18 1350 04/21/18 0504 04/23/18 0531  WBC 8.9 7.3 6.4  NEUTROABS 6.4  --   --   HGB 11.5* 10.7* 10.9*  HCT 36.7 34.8* 34.6*  MCV 95.1 96.7 96.1  PLT 168 181 207   Cardiac Enzymes: Recent Labs  Lab 04/20/18 1350  TROPONINI <0.03   CBG (last 3)  No results for input(s): GLUCAP in the last 72 hours. Recent Results (from the past 240 hour(s))  Urine culture     Status: Abnormal (Preliminary result)   Collection Time: 04/20/18  1:50 PM  Result Value Ref Range Status   Specimen Description   Final    URINE, CATHETERIZED Performed at [facility], [address] [place], [place] [address]    Special Requests   Final    NONE Performed at [facility], [address], [place], [place] [address]    Culture >=100,000 COLONIES/mL ENTEROCOCCUS FAECALIS (A)  Final   Report Status PENDING  Incomplete  Culture, blood (routine x 2)     Status: None (Preliminary result)   Collection Time: 04/20/18  5:11 PM  Result Value Ref Range Status   Specimen Description LEFT ANTECUBITAL  Final   Special Requests   Final    BOTTLES DRAWN AEROBIC AND ANAEROBIC Blood Culture adequate volume   Culture   Final    NO GROWTH 3 DAYS Performed at [facility], [address] [place], [place] [address]    Report Status PENDING  Incomplete  Culture, blood (routine x 2)     Status: Abnormal   Collection Time: 04/20/18  5:15 PM  Result Value Ref Range Status   Specimen Description   Final    PORTA CATH Performed at [facility], [address], [place], [place] [address]    Special Requests   Final    BOTTLES DRAWN AEROBIC AND ANAEROBIC Blood Culture adequate  volume Performed at [facility], [address] [place], [place] [address]    Culture  Setup Time   Final    GRAM POSITIVE COCCI IN BOTH AEROBIC AND ANAEROBIC BOTTLES Gram Stain Report Called to,Read Back By and Verified With: COX,D AT 8270 ON 4.14.20 BY ISLEY,B Performed at [place], READ BACK BY AND VERIFIED WITH: R PATE RN 04/21/18 1948 JDW    Culture (A)  Final    STAPHYLOCOCCUS SPECIES (COAGULASE NEGATIVE) THE SIGNIFICANCE OF ISOLATING THIS ORGANISM FROM A SINGLE SET OF BLOOD CULTURES WHEN MULTIPLE SETS ARE DRAWN IS UNCERTAIN. PLEASE NOTIFY THE MICROBIOLOGY DEPARTMENT WITHIN ONE WEEK IF SPECIATION AND SENSITIVITIES ARE REQUIRED. Performed at [facility], [place]  [address], [place], [place] [address]    Report Status 04/23/2018 FINAL  Final  Blood Culture ID Panel (Reflexed)     Status: Abnormal   Collection Time: 04/20/18  5:15 PM  Result Value Ref Range Status   Enterococcus species NOT DETECTED NOT DETECTED Final   Listeria monocytogenes NOT DETECTED NOT DETECTED Final   Staphylococcus species DETECTED (A) NOT DETECTED Final    Comment: Methicillin (oxacillin) resistant coagulase negative staphylococcus. Possible blood culture contaminant (unless isolated from more than one blood culture draw or clinical case suggests pathogenicity). No antibiotic treatment is indicated for blood  culture contaminants. CRITICAL RESULT CALLED TO, READ BACK BY AND VERIFIED WITH: R PATE RN 04/21/18 1948 JDW    Staphylococcus aureus (BCID) NOT DETECTED NOT DETECTED Final   Methicillin resistance DETECTED (A) NOT DETECTED Final    Comment: CRITICAL RESULT CALLED TO, READ BACK BY AND VERIFIED WITH: R PATE RN 04/21/18 1948 JDW    Streptococcus species NOT DETECTED NOT DETECTED Final   Streptococcus agalactiae NOT DETECTED NOT DETECTED Final   Streptococcus pneumoniae NOT DETECTED NOT DETECTED Final   Streptococcus pyogenes NOT DETECTED NOT DETECTED Final    Acinetobacter baumannii NOT DETECTED NOT DETECTED Final   Enterobacteriaceae species NOT DETECTED NOT DETECTED Final   Enterobacter cloacae complex NOT DETECTED NOT DETECTED Final   Escherichia coli NOT DETECTED NOT DETECTED Final   Klebsiella oxytoca NOT DETECTED NOT DETECTED Final   Klebsiella pneumoniae NOT DETECTED NOT DETECTED Final   Proteus species NOT DETECTED NOT DETECTED Final   Serratia marcescens NOT DETECTED NOT DETECTED Final   Haemophilus influenzae NOT DETECTED NOT DETECTED Final   Neisseria meningitidis NOT DETECTED NOT DETECTED Final   Pseudomonas aeruginosa NOT DETECTED NOT DETECTED Final   Candida albicans NOT DETECTED NOT DETECTED Final   Candida glabrata NOT DETECTED NOT DETECTED Final   Candida krusei NOT DETECTED NOT DETECTED Final   Candida parapsilosis NOT DETECTED NOT DETECTED Final   Candida tropicalis NOT DETECTED NOT DETECTED Final    Comment: Performed at [facility], [place]. [address], [place], [place] [address]     Studies: Dg C-arm 1-60 Min-no Report  Result Date: 04/22/2018 Fluoroscopy was utilized by the requesting physician.  No radiographic interpretation.   Ct Renal Stone Study  Result Date: 04/22/2018 CLINICAL DATA:  Hydronephrosis EXAM: CT ABDOMEN AND PELVIS WITHOUT CONTRAST TECHNIQUE: Multidetector CT imaging of the abdomen and pelvis was performed following the standard protocol without IV contrast. COMPARISON:  01/21/2016 FINDINGS: Lower chest: No acute abnormality. Hepatobiliary: No focal liver abnormality is seen. No gallstones, gallbladder wall thickening, or biliary dilatation. Pancreas: No pancreatic mass or peripancreatic inflammatory changes. Fatty atrophy of the pancreas. Spleen: Normal in size without focal abnormality. Adrenals/Urinary Tract: Normal adrenal glands. 9 mm distal right ureteral calculus resulting in severe right hydroureteronephrosis. Bilateral nonobstructing renal calculi. Atrophic left kidney. Normal bladder.  Stomach/Bowel: Stomach is within normal limits. No evidence of bowel wall thickening, distention, or inflammatory changes. Vascular/Lymphatic: Abdominal aortic atherosclerosis. No lymphadenopathy. Reproductive: Uterus and bilateral adnexa are unremarkable. Other: No abdominal wall hernia or abnormality. No abdominopelvic ascites. Musculoskeletal: No acute osseous abnormality. No aggressive osseous lesion. Grade 1 anterolisthesis of L4 on L5 secondary to facet disease. Bilateral foraminal stenosis at L4-5. Degenerative disc disease with disc height loss at L3-4, L4-5 and L5-S1. IMPRESSION: 1. 9 mm distal right ureteral calculus resulting in severe right hydroureteronephrosis. 2. Bilateral nonobstructing renal calculi. 3. Atrophic left kidney. 4.  Aortic Atherosclerosis (ICD10-I70.0). Electronically Signed  By: Kathreen Devoid   On: 04/22/2018 14:17     Scheduled Meds: . chlorproMAZINE  25 mg Oral BID  . escitalopram  10 mg Oral QHS  . heparin  5,000 Units Subcutaneous Q8H  . levothyroxine  50 mcg Oral QAC breakfast  . OLANZapine  20 mg Oral Daily  . pantoprazole  40 mg Oral Daily  . sodium bicarbonate  650 mg Oral BID   Continuous Infusions: . sodium chloride 65 mL/hr at 04/23/18 1729  . cefTRIAXone (ROCEPHIN)  IV Stopped (04/23/18 1702)    Principal Problem:   Acute metabolic encephalopathy Active Problems:   Hypothyroidism   Hypertension   AKI (acute kidney injury) ([place])   UTI (urinary tract infection)   Hypomagnesemia   Time spent:   Kathie Dike, MD [facility] 04/23/2018, 7:26 PM    LOS: 3 days  How to contact the [facility] Attending or Consulting provider [place] or covering provider during after hours [place], for this patient?  1. Check the care team in [facility] and look for a) attending/consulting [facility] provider listed and b) the [facility] team listed 2. Log into [email or web address] and use [place]'s universal password to access. If you do not have the password, please contact the  hospital operator. 3. Locate the [facility] provider you are looking for under [facility] and page to a number that you can be directly reached. 4. If you still have difficulty reaching the provider, please page the [facility] (Director on Call) for the Hospitalists listed on amion for assistance.

## 2018-04-24 LAB — RENAL FUNCTION PANEL
Albumin: 2.9 g/dL — ABNORMAL LOW (ref 3.5–5.0)
Anion gap: 8 (ref 5–15)
BUN: 30 mg/dL — ABNORMAL HIGH (ref 8–23)
CO2: 20 mmol/L — ABNORMAL LOW (ref 22–32)
Calcium: 8.2 mg/dL — ABNORMAL LOW (ref 8.9–10.3)
Chloride: 112 mmol/L — ABNORMAL HIGH (ref 98–111)
Creatinine, Ser: 2.33 mg/dL — ABNORMAL HIGH (ref 0.44–1.00)
GFR calc Af Amer: 25 mL/min — ABNORMAL LOW (ref >60)
GFR calc non Af Amer: 21 mL/min — ABNORMAL LOW (ref >60)
Glucose, Bld: 95 mg/dL (ref 70–99)
Phosphorus: 3.8 mg/dL (ref 2.5–4.6)
Potassium: 5 mmol/L (ref 3.5–5.1)
Sodium: 140 mmol/L (ref 135–145)

## 2018-04-24 LAB — CREATININE, SERUM
Creatinine, Ser: 2.37 mg/dL — ABNORMAL HIGH (ref 0.44–1.00)
GFR calc Af Amer: 24 mL/min — ABNORMAL LOW (ref >60)
GFR calc non Af Amer: 21 mL/min — ABNORMAL LOW (ref >60)

## 2018-04-24 MED ORDER — FOSFOMYCIN TROMETHAMINE 3 G PO PACK: 3.0000 g | PACK | Freq: Two times a day (BID) | ORAL | Status: DC

## 2018-04-24 MED ORDER — SODIUM ZIRCONIUM CYCLOSILICATE 10 G PO PACK
10.0000 g | PACK | Freq: Once | ORAL | Status: AC
  Administered 2018-04-24: 10 g via ORAL
  Filled 2018-04-24: qty 1

## 2018-04-24 MED ORDER — FOSFOMYCIN TROMETHAMINE 3 G PO PACK
3.0000 g | PACK | ORAL | Status: DC
  Administered 2018-04-24: 3 g via ORAL
  Filled 2018-04-24: qty 3

## 2018-04-24 NOTE — Progress Notes (Addendum)
Patient ID: Rachel Morgan, female   DOB: 11/12/1950, 68 y.o.   MRN: [id number] S:No events overnight. O:BP (!) 139/52 (BP Location: Left Arm)   Pulse 67   Temp 99.4 F (37.4 C) (Oral)   Resp 18   Ht 4\' 11"  (1.499 m)   Wt 130.1 kg   SpO2 100%   BMI 57.93 kg/m   Intake/Output Summary (Last 24 hours) at 04/24/2018 0906 Last data filed at 04/24/2018 0500 Gross per 24 hour  Intake 2610.5 ml  Output 3000 ml  Net -389.5 ml   Intake/Output: I/O last 3 completed shifts: In: 3723.4 [P.O.:1430; I.V.:2093.4; IV Piggyback:200] Out: 6400 [Urine:6400]  Intake/Output this shift:  No intake/output data recorded. Weight change: -0.4 kg Gen: NAD, obese WF CVS: no rub Resp: cta Abd: benign Ext: no edema  Recent Labs  Lab 04/20/18 1350 04/21/18 0504 04/22/18 0529 04/23/18 0531 04/24/18 0513  NA 140 139  --  140 140  K 4.9 5.0  --  5.1 5.0  CL 110 110  --  112* 112*  CO2 20* 19*  --  20* 20*  GLUCOSE 128* 104*  --  88 95  BUN 55* 51*  --  35* 30*  CREATININE 4.83* 4.85* 4.53* 3.15* 2.37*  2.33*  ALBUMIN 3.5  --   --  2.9* 2.9*  CALCIUM 8.6* 8.0*  --  8.3* 8.2*  PHOS  --   --   --  3.5 3.8  AST 11*  --   --   --   --   ALT 10  --   --   --   --    Liver Function Tests: Recent Labs  Lab 04/20/18 1350 04/23/18 0531 04/24/18 0513  AST 11*  --   --   ALT 10  --   --   ALKPHOS 75  --   --   BILITOT 0.8  --   --   PROT 6.5  --   --   ALBUMIN 3.5 2.9* 2.9*   No results for input(s): LIPASE, AMYLASE in the last 168 hours. No results for input(s): AMMONIA in the last 168 hours. CBC: Recent Labs  Lab 04/20/18 1350 04/21/18 0504 04/23/18 0531  WBC 8.9 7.3 6.4  NEUTROABS 6.4  --   --   HGB 11.5* 10.7* 10.9*  HCT 36.7 34.8* 34.6*  MCV 95.1 96.7 96.1  PLT 168 181 207   Cardiac Enzymes: Recent Labs  Lab 04/20/18 1350  TROPONINI <0.03   CBG: No results for input(s): GLUCAP in the last 168 hours.  Iron Studies: No results for input(s): IRON, TIBC, TRANSFERRIN,  FERRITIN in the last 72 hours. Studies/Results: Dg C-arm 1-60 Min-no Report  Result Date: 04/22/2018 Fluoroscopy was utilized by the requesting physician.  No radiographic interpretation.   Ct Renal Stone Study  Result Date: 04/22/2018 CLINICAL DATA:  Hydronephrosis EXAM: CT ABDOMEN AND PELVIS WITHOUT CONTRAST TECHNIQUE: Multidetector CT imaging of the abdomen and pelvis was performed following the standard protocol without IV contrast. COMPARISON:  01/21/2016 FINDINGS: Lower chest: No acute abnormality. Hepatobiliary: No focal liver abnormality is seen. No gallstones, gallbladder wall thickening, or biliary dilatation. Pancreas: No pancreatic mass or peripancreatic inflammatory changes. Fatty atrophy of the pancreas. Spleen: Normal in size without focal abnormality. Adrenals/Urinary Tract: Normal adrenal glands. 9 mm distal right ureteral calculus resulting in severe right hydroureteronephrosis. Bilateral nonobstructing renal calculi. Atrophic left kidney. Normal bladder. Stomach/Bowel: Stomach is within normal limits. No evidence of bowel wall thickening, distention, or inflammatory  changes. Vascular/Lymphatic: Abdominal aortic atherosclerosis. No lymphadenopathy. Reproductive: Uterus and bilateral adnexa are unremarkable. Other: No abdominal wall hernia or abnormality. No abdominopelvic ascites. Musculoskeletal: No acute osseous abnormality. No aggressive osseous lesion. Grade 1 anterolisthesis of L4 on L5 secondary to facet disease. Bilateral foraminal stenosis at L4-5. Degenerative disc disease with disc height loss at L3-4, L4-5 and L5-S1. IMPRESSION: 1. 9 mm distal right ureteral calculus resulting in severe right hydroureteronephrosis. 2. Bilateral nonobstructing renal calculi. 3. Atrophic left kidney. 4.  Aortic Atherosclerosis (ICD10-I70.0). Electronically Signed   By: Kathreen Devoid   On: 04/22/2018 14:17   . chlorproMAZINE  25 mg Oral BID  . escitalopram  10 mg Oral QHS  . heparin  5,000 Units  Subcutaneous Q8H  . levothyroxine  50 mcg Oral QAC breakfast  . OLANZapine  20 mg Oral Daily  . pantoprazole  40 mg Oral Daily  . sodium bicarbonate  650 mg Oral BID  . sodium zirconium cyclosilicate  10 g Oral Once    BMET    Component Value Date/Time   NA 140 04/24/2018 0513   K 5.0 04/24/2018 0513   CL 112 (H) 04/24/2018 0513   CO2 20 (L) 04/24/2018 0513   GLUCOSE 95 04/24/2018 0513   BUN 30 (H) 04/24/2018 0513   CREATININE 2.37 (H) 04/24/2018 0513   CREATININE 2.33 (H) 04/24/2018 0513   CALCIUM 8.2 (L) 04/24/2018 0513   GFRNONAA 21 (L) 04/24/2018 0513   GFRNONAA 21 (L) 04/24/2018 0513   GFRAA 24 (L) 04/24/2018 0513   GFRAA 25 (L) 04/24/2018 0513   CBC    Component Value Date/Time   WBC 6.4 04/23/2018 0531   RBC 3.60 (L) 04/23/2018 0531   HGB 10.9 (L) 04/23/2018 0531   HCT 34.6 (L) 04/23/2018 0531   PLT 207 04/23/2018 0531   MCV 96.1 04/23/2018 0531   MCH 30.3 04/23/2018 0531   MCHC 31.5 04/23/2018 0531   RDW 13.6 04/23/2018 0531   LYMPHSABS 1.4 04/20/2018 1350   MONOABS 1.1 (H) 04/20/2018 1350   EOSABS 0.0 04/20/2018 1350   BASOSABS 0.1 04/20/2018 1350     Assessment/Plan:  1. AKI/CKD stage 3- due to obstructive uropathy of right kidney and atrophic left kidney on CT scan but 9.5cm on [place].  Marked improvement of Cr and UOP following stent placement by Urology.  Appreciate Dr. Noland Fordyce assistance and care.  Continue to follow UOP and Scr.  Baseline Cr 1.4-1.8.  Peaked at 4.85 and has continued to fall 3.15 04/23/18 and to 2.33 today.  Good UOP.  Nothing further to add.  Will sign off.  Please call with any questions or concerns. 1. Follow up with our office if Cr does not return to baseline over the next few weeks 2. Hold aldactone given her AKI and hyperkalemia.  Will need to be normalized before this can be restarted 3. F/u with urology for stent management  4. Avoid NSAIDs/Cox-II inhibitors and IV contrast. 2. Obstructive uropathy- s/p cystoscopy and double J  stent placement with marked improvement of UOP and Scr. 1. Follow up with urology. 3. HTN- low BP, holding aldactone 4. Hyperkalemia- stable and should improve now that obstruction has been removed. 1. Will give one dose of Lokelma and do not resume aldactone until renal function returns to baseline and will need close outpatient follow up with her PCP after discharge. 5. Metabolic acidosis- continue with sodium bicarb 6. AMS- multifactorial and now improving. AKI, toxic effect of psych meds, and probable UTI.  Improving  daily.    Donetta Potts, MD [employer] [phone]

## 2018-04-24 NOTE — TOC Initial Note (Signed)
Transition of Care [facility]) - Initial/Assessment Note    Patient Details  Name: Rachel Morgan MRN: [id number] Date of Birth: May 29, 1950  Transition of [facility] CM/SW Contact:    Trish Mage, LCSW Phone Number: 04/24/2018, 3:25 PM  Clinical Narrative:     Sister who is caregiver and guardian states they have walker and bedside commode at home.  Do not need any other equipment, will follow up with making sure pt gets to PCP and nephrology in the next 2 weeks, and is appreciative of care here.              Expected Discharge Plan: Home/Self Care Barriers to Discharge: No Barriers Identified   Patient Goals and CMS Choice Patient states their goals for this hospitalization and ongoing recovery are:: Feel better      Expected Discharge Plan and Services Expected Discharge Plan: Home/Self Care In-house Referral: Clinical Social Work Discharge Planning Services: CM Consult   Living arrangements for the past 2 months: Single Family Home                          Prior Living Arrangements/Services Living arrangements for the past 2 months: Single Family Home Lives with:: Siblings Patient language and need for interpreter reviewed:: Yes Do you feel safe going back to the place where you live?: Yes      Need for Family Participation in Patient Care: Yes (Comment) Care giver support system in place?: Yes (comment) Current home services: DME Criminal Activity/Legal Involvement Pertinent to Current Situation/Hospitalization: No - Comment as needed  Activities of Daily Living Home Assistive Devices/Equipment: None ADL Screening (condition at time of admission) Patient's cognitive ability adequate to safely complete daily activities?: Yes Is the patient deaf or have difficulty hearing?: Yes Does the patient have difficulty seeing, even when wearing glasses/contacts?: Yes Does the patient have difficulty concentrating, remembering, or making decisions?: Yes Patient able to  express need for assistance with ADLs?: No Does the patient have difficulty dressing or bathing?: Yes Independently performs ADLs?: No Communication: Independent Dressing (OT): Needs assistance Is this a change from baseline?: Pre-admission baseline Grooming: Needs assistance Is this a change from baseline?: Pre-admission baseline Feeding: Needs assistance Is this a change from baseline?: Pre-admission baseline Bathing: Needs assistance Is this a change from baseline?: Pre-admission baseline Toileting: Needs assistance Is this a change from baseline?: Pre-admission baseline In/Out Bed: Needs assistance Is this a change from baseline?: Pre-admission baseline Walks in Home: Needs assistance Is this a change from baseline?: Pre-admission baseline Does the patient have difficulty walking or climbing stairs?: No Weakness of Legs: Both Weakness of Arms/Hands: None  Permission Sought/Granted Permission sought to share information with : Family Supports Permission granted to share information with : Yes, Verbal Permission Granted  Share Information with NAME: Alyona Romack     Permission granted to share info w Relationship: sister  Permission granted to share info w Contact Information: [phone]  Emotional Assessment Appearance:: Appears stated age Attitude/Demeanor/Rapport: Unable to Assess Affect (typically observed): Quiet Orientation: : Oriented to Self Alcohol / Substance Use: Not Applicable Psych Involvement: No (comment)  Admission diagnosis:  Hypomagnesemia [E83.42] AKI (acute kidney injury) ([place]) [N17.9] Urinary tract infection without hematuria, site unspecified [N39.0] Patient Active Problem List   Diagnosis Date Noted  . Acute metabolic encephalopathy 32/35/5732  . Hypomagnesemia 04/21/2018  . UTI (urinary tract infection) 04/20/2018  . PICC (peripherally inserted central catheter) in place   . Urinary  tract infection without hematuria   . Hydronephrosis with  renal and ureteral calculus obstruction 01/21/2016  . Hyperkalemia 01/21/2016  . Palliative care encounter   . DNR (do not resuscitate) discussion   . Encephalopathy 10/17/2015  . Goals of care, counseling/discussion 10/17/2015  . Nephrolithiasis 09/27/2015  . Cerebral palsy ([place]) 09/27/2015  . Sepsis ([place]) 09/26/2015  . AKI (acute kidney injury) ([place]) 09/26/2015  . Fever   . Urinary tract infectious disease   . Dilated cbd, acquired 09/14/2015  . Hypothyroidism 09/14/2015  . Hypertension 09/14/2015  . Depression with anxiety 09/14/2015  . UPJ obstruction, acquired 09/14/2015  . CKD (chronic kidney disease), stage III ([place]) 09/14/2015  . GERD (gastroesophageal reflux disease) 09/14/2015  . Calculus of gallbladder without cholecystitis without obstruction   . Abdominal pain, right upper quadrant 02/11/2011  . Acute pyelonephritis 02/11/2011  . Agoraphobia 02/11/2011  . Acute renal failure ([place]) 02/11/2011  . Hyponatremia 02/11/2011  . Dehydration 02/11/2011   PCP:  [facility]:   [place], [place] - [address] [place] [address] Phone: [phone] Fax: [phone]     Social Determinants of Health (SDOH) Interventions    Readmission Risk Interventions No flowsheet data found.

## 2018-04-24 NOTE — Progress Notes (Signed)
PROGRESS NOTE    Rachel Morgan  JIR:[id number]  DOB: 03/24/1950  DOA: 04/20/2018 PCP: [facility]   Brief Admission Hx: 68 year old female with cerebral palsy with cognitive developmental delay, hypothyroidism, hypertension, stage III CKD with reported baseline creatinine 1.4-1.8 admitted with acute metabolic encephalopathy.  MDM/Assessment & Plan:   1. Acute metabolic encephalopathy- multifactorial likely secondary to worsening renal function, urinary tract infection and toxic effects of her psychiatric medications in the setting of renal failure.  Continue supportive therapy.  Treat underlying conditions. 2. Urinary tract infection with cystitis- patient has been started on ceftriaxone.  Urine culture shows enterococcus.  Since patient has penicillin allergy, will change ceftriaxone to fosfomycin.  Blood culture, 1 out of 2 positive for gram-positive cocci.  Likely contaminant from Port-A-Cath..  Continue IV fluids for hydration.  She was briefly placed on vancomycin, but this has been since been discontinued 3. Acute kidney injury on CKD stage III- renal function testing from 2018 reveals a baseline creatinine of 1.4-1.8.  Creatinine had increased to 4.83.  She was noted to have some bladder outlet obstruction that was relieved with in and out catheterization. Nephrology following renally dose medications as appropriate and hold nonessential medications.  Renal ultrasound shows right-sided hydronephrosis with possible stone.  CT renal stone study confirmed right hydronephrosis with obstructing stone.  Seen by urology and underwent double-J stent placement.  Follow-up creatinine continues to improve.  Continue to monitor. 4. Hypothyroidism-resume home levothyroxine. 5. Essential hypertension-holding home spironolactone. 6. Cerebral palsy with cognitive developmental delay- resume home Zyprexa and Thorazine as sister reports that she takes these daily for  maintenance to avoid behavioral issues.  Reduced doses as necessary. 7. GERD-Protonix ordered for GI protection.  DVT prophylaxis: Heparin Code Status: Full Family Communication: Discussed with sister over the phone Disposition Plan: Discharge home in the next 24 hours if she continues to improve   Consultants:  Nephrology  Urology  Procedures:  Right double-J stent placement for hydronephrosis  Antimicrobials:  Ceftriaxone 04/20/2018> 1/17  Fosfomycin 4/17 >  Subjective: Does not appear to be in any pain.  No nausea or vomiting.  Objective: Vitals:   04/23/18 1417 04/23/18 1937 04/23/18 2139 04/24/18 0512  BP: (!) 114/50  (!) 99/54 (!) 139/52  Pulse: 73  72 67  Resp:   18 18  Temp: 97.6 F (36.4 C)  (!) 97.5 F (36.4 C) 99.4 F (37.4 C)  TempSrc: Oral  Oral Oral  SpO2: (!) 87% 93% 96% 100%  Weight:    130.1 kg  Height:        Intake/Output Summary (Last 24 hours) at 04/24/2018 1937 Last data filed at 04/24/2018 1759 Gross per 24 hour  Intake 2248.89 ml  Output 2600 ml  Net -351.11 ml   Filed Weights   04/22/18 0500 04/23/18 0415 04/24/18 0512  Weight: 130.2 kg 130.5 kg 130.1 kg     REVIEW OF SYSTEMS  Unable to obtain due to cognitive developmental delay  Exam:  General exam: Alert, awake, no acute distress Respiratory system: Clear to auscultation. Respiratory effort normal. Cardiovascular system:RRR. No murmurs, rubs, gallops. Gastrointestinal system: Abdomen is nondistended, soft and nontender. No organomegaly or masses felt. Normal bowel sounds heard. Central nervous system: No focal neurological deficits. Extremities: No C/C/E, +pedal pulses Skin: No rashes, lesions or ulcers Psychiatry: Nonverbal, tries to communicate.    Data Reviewed: Basic Metabolic Panel: Recent Labs  Lab 04/20/18 1350 04/20/18 1357 04/21/18 0504 04/22/18 0529 04/23/18 0531 04/24/18 7510  NA 140  --  139  --  140 140  K 4.9  --  5.0  --  5.1 5.0  CL 110  --   110  --  112* 112*  CO2 20*  --  19*  --  20* 20*  GLUCOSE 128*  --  104*  --  88 95  BUN 55*  --  51*  --  35* 30*  CREATININE 4.83*  --  4.85* 4.53* 3.15* 2.37*  2.33*  CALCIUM 8.6*  --  8.0*  --  8.3* 8.2*  MG  --  1.4* 1.9  --   --   --   PHOS  --   --   --   --  3.5 3.8   Liver Function Tests: Recent Labs  Lab 04/20/18 1350 04/23/18 0531 04/24/18 0513  AST 11*  --   --   ALT 10  --   --   ALKPHOS 75  --   --   BILITOT 0.8  --   --   PROT 6.5  --   --   ALBUMIN 3.5 2.9* 2.9*   No results for input(s): LIPASE, AMYLASE in the last 168 hours. No results for input(s): AMMONIA in the last 168 hours. CBC: Recent Labs  Lab 04/20/18 1350 04/21/18 0504 04/23/18 0531  WBC 8.9 7.3 6.4  NEUTROABS 6.4  --   --   HGB 11.5* 10.7* 10.9*  HCT 36.7 34.8* 34.6*  MCV 95.1 96.7 96.1  PLT 168 181 207   Cardiac Enzymes: Recent Labs  Lab 04/20/18 1350  TROPONINI <0.03   CBG (last 3)  No results for input(s): GLUCAP in the last 72 hours. Recent Results (from the past 240 hour(s))  Urine culture     Status: Abnormal (Preliminary result)   Collection Time: 04/20/18  1:50 PM  Result Value Ref Range Status   Specimen Description   Final    URINE, CATHETERIZED Performed at [facility], [address] [place], [place] [address]    Special Requests   Final    NONE Performed at [facility], [address] [place], [place] [address]    Culture (A)  Final    >=100,000 COLONIES/mL ENTEROCOCCUS FAECALIS REPEATING SUSCEPTIBILITIES Performed at [facility], [place] [address], [place], [place] [address]    Report Status PENDING  Incomplete  Culture, blood (routine x 2)     Status: None (Preliminary result)   Collection Time: 04/20/18  5:11 PM  Result Value Ref Range Status   Specimen Description LEFT ANTECUBITAL  Final   Special Requests   Final    BOTTLES DRAWN AEROBIC AND ANAEROBIC Blood Culture adequate volume   Culture   Final    NO GROWTH 4 DAYS Performed at [facility], [address] [place], [place] [address]    Report Status PENDING  Incomplete  Culture, blood (routine x 2)     Status: Abnormal   Collection Time: 04/20/18  5:15 PM  Result Value Ref Range Status   Specimen Description   Final    PORTA CATH Performed at [facility], [address], [place], [place] [address]    Special Requests   Final    BOTTLES DRAWN AEROBIC AND ANAEROBIC Blood Culture adequate volume Performed at [facility], [address] [place], [place] [address]    Culture  Setup Time   Final    GRAM POSITIVE COCCI IN BOTH AEROBIC AND ANAEROBIC BOTTLES Gram Stain Report Called to,Read  Back By and Verified With: COX,D AT 2297 ON 4.14.20 BY ISLEY,B Performed at [facility], READ BACK BY AND VERIFIED WITH: R PATE RN 04/21/18 1948 JDW    Culture (A)  Final    STAPHYLOCOCCUS SPECIES (COAGULASE NEGATIVE) THE SIGNIFICANCE OF ISOLATING THIS ORGANISM FROM A SINGLE SET OF BLOOD CULTURES WHEN MULTIPLE SETS ARE DRAWN IS UNCERTAIN. PLEASE NOTIFY THE MICROBIOLOGY DEPARTMENT WITHIN ONE WEEK IF SPECIATION AND SENSITIVITIES ARE REQUIRED. Performed at [facility], [place] [address], [place], [place] [address]    Report Status 04/23/2018 FINAL  Final  Blood Culture ID Panel (Reflexed)     Status: Abnormal   Collection Time: 04/20/18  5:15 PM  Result Value Ref Range Status   Enterococcus species NOT DETECTED NOT DETECTED Final   Listeria monocytogenes NOT DETECTED NOT DETECTED Final   Staphylococcus species DETECTED (A) NOT DETECTED Final    Comment: Methicillin (oxacillin) resistant coagulase negative staphylococcus. Possible blood culture contaminant (unless isolated from more than one blood culture draw or clinical case suggests pathogenicity). No antibiotic treatment is indicated for blood  culture contaminants. CRITICAL RESULT CALLED TO, READ BACK BY AND VERIFIED WITH: R PATE RN 04/21/18 1948 JDW    Staphylococcus aureus (BCID) NOT DETECTED NOT  DETECTED Final   Methicillin resistance DETECTED (A) NOT DETECTED Final    Comment: CRITICAL RESULT CALLED TO, READ BACK BY AND VERIFIED WITH: R PATE RN 04/21/18 1948 JDW    Streptococcus species NOT DETECTED NOT DETECTED Final   Streptococcus agalactiae NOT DETECTED NOT DETECTED Final   Streptococcus pneumoniae NOT DETECTED NOT DETECTED Final   Streptococcus pyogenes NOT DETECTED NOT DETECTED Final   Acinetobacter baumannii NOT DETECTED NOT DETECTED Final   Enterobacteriaceae species NOT DETECTED NOT DETECTED Final   Enterobacter cloacae complex NOT DETECTED NOT DETECTED Final   Escherichia coli NOT DETECTED NOT DETECTED Final   Klebsiella oxytoca NOT DETECTED NOT DETECTED Final   Klebsiella pneumoniae NOT DETECTED NOT DETECTED Final   Proteus species NOT DETECTED NOT DETECTED Final   Serratia marcescens NOT DETECTED NOT DETECTED Final   Haemophilus influenzae NOT DETECTED NOT DETECTED Final   Neisseria meningitidis NOT DETECTED NOT DETECTED Final   Pseudomonas aeruginosa NOT DETECTED NOT DETECTED Final   Candida albicans NOT DETECTED NOT DETECTED Final   Candida glabrata NOT DETECTED NOT DETECTED Final   Candida krusei NOT DETECTED NOT DETECTED Final   Candida parapsilosis NOT DETECTED NOT DETECTED Final   Candida tropicalis NOT DETECTED NOT DETECTED Final    Comment: Performed at [facility], [place]. [address] [place], [place] [address]     Studies: No results found.   Scheduled Meds: . chlorproMAZINE  25 mg Oral BID  . escitalopram  10 mg Oral QHS  . fosfomycin  3 g Oral Q12H  . heparin  5,000 Units Subcutaneous Q8H  . levothyroxine  50 mcg Oral QAC breakfast  . OLANZapine  20 mg Oral Daily  . pantoprazole  40 mg Oral Daily  . sodium bicarbonate  650 mg Oral BID   Continuous Infusions: . sodium chloride 65 mL/hr at 04/24/18 0720    Principal Problem:   Acute metabolic encephalopathy Active Problems:   Hypothyroidism   Hypertension   AKI (acute kidney injury)  ([place])   Cerebral palsy ([facility])   Hydronephrosis with renal and ureteral calculus obstruction   UTI (urinary tract infection)   Hypomagnesemia   Time spent:   Kathie Dike, MD [facility] 04/24/2018,  7:37 PM    LOS: 4 days

## 2018-04-25 DIAGNOSIS — N132 Hydronephrosis with renal and ureteral calculous obstruction: Secondary | ICD-10-CM

## 2018-04-25 DIAGNOSIS — G808 Other cerebral palsy: Secondary | ICD-10-CM

## 2018-04-25 LAB — CULTURE, BLOOD (ROUTINE X 2)
Culture: NO GROWTH
Special Requests: ADEQUATE

## 2018-04-25 LAB — RENAL FUNCTION PANEL
Albumin: 2.9 g/dL — ABNORMAL LOW (ref 3.5–5.0)
Anion gap: 9 (ref 5–15)
BUN: 24 mg/dL — ABNORMAL HIGH (ref 8–23)
CO2: 22 mmol/L (ref 22–32)
Calcium: 8.1 mg/dL — ABNORMAL LOW (ref 8.9–10.3)
Chloride: 109 mmol/L (ref 98–111)
Creatinine, Ser: 1.76 mg/dL — ABNORMAL HIGH (ref 0.44–1.00)
GFR calc Af Amer: 35 mL/min — ABNORMAL LOW (ref >60)
GFR calc non Af Amer: 30 mL/min — ABNORMAL LOW (ref >60)
Glucose, Bld: 134 mg/dL — ABNORMAL HIGH (ref 70–99)
Phosphorus: 3.1 mg/dL (ref 2.5–4.6)
Potassium: 4.7 mmol/L (ref 3.5–5.1)
Sodium: 140 mmol/L (ref 135–145)

## 2018-04-25 LAB — URINE CULTURE: Culture: >=100000 — AB

## 2018-04-25 MED ORDER — SORBITOL 70 % SOLN
960.0000 mL | TOPICAL_OIL | Freq: Once | ORAL | Status: AC
  Administered 2018-04-25: 960 mL via RECTAL
  Filled 2018-04-25: qty 473

## 2018-04-25 MED ORDER — SODIUM BICARBONATE 650 MG PO TABS: 650.0000 mg | ORAL_TABLET | Freq: Two times a day (BID) | ORAL | 0 refills | Status: DC

## 2018-04-25 MED ORDER — FOSFOMYCIN TROMETHAMINE 3 G PO PACK: 3.0000 g | PACK | ORAL | 0 refills | Status: DC

## 2018-04-25 MED ORDER — HEPARIN SOD (PORK) LOCK FLUSH 100 UNIT/ML IV SOLN
500.0000 [IU] | INTRAVENOUS | Status: AC | PRN
  Administered 2018-04-25: 17:00:00 500 [IU]
  Filled 2018-04-25: qty 5

## 2018-04-25 NOTE — Progress Notes (Signed)
Pt port de-accessed and flushed with Heparin. Rachel Morgan catheter removed per MD order. D/C instructions reviewed with patient's legal guardian, aware of discharge. Awaiting EMS to transport patient. Pt has not had a BM since D/C, MD put in an order for a SMOG enema to be given before departure. Nursing staff will give when medication delivered.

## 2018-04-25 NOTE — Discharge Summary (Addendum)
Physician Discharge Summary  Rachel Morgan YBO:[id number] DOB: [birth year] DOA: 04/20/2018  PCP: [facility] date: 04/20/2018 Discharge date: 04/25/2018  Admitted From: Home Disposition: Home  Recommendations for Outpatient Follow-up:  1. Follow up with PCP in 1-2 weeks 2. Please obtain BMP/CBC in one week 3. Follow-up with urology next 2 weeks 4. Follow-up with nephrology if renal function does not normalize in the next few weeks  Discharge Condition: Stable CODE STATUS: Full code Diet recommendation: Heart healthy  Brief/Interim Summary: 68 year old female with cerebral palsy with cognitive developmental delay, hypothyroidism, hypertension, stage III CKD with reported baseline creatinine 1.4-1.8 admitted with acute metabolic encephalopathy.  Discharge Diagnoses:  Principal Problem:   Acute metabolic encephalopathy Active Problems:   Hypothyroidism   Hypertension   AKI (acute kidney injury) ([place])   Cerebral palsy ([place])   Hydronephrosis with renal and ureteral calculus obstruction   UTI (urinary tract infection)   Hypomagnesemia Morbid obesity  1. Acute metabolic encephalopathy- multifactorial likely secondary to worsening renal function, urinary tract infection and toxic effects of her psychiatric medications in the setting of renal failure.  Continue supportive therapy.  Treat underlying conditions.  Mental status returned to baseline at the time of discharge 2. Urinary tract infection with cystitis- patient was initially treated with ceftriaxone.  Urine culture shows enterococcus.  Since patient has penicillin allergy, ceftriaxone was changed to fosfomycin.  Blood culture, 1 out of 2 positive for gram-positive cocci.  Likely contaminant from Port-A-Cath..   3. Acute kidney injury on CKD stage III- renal function testing from 2018 reveals a baseline creatinine of 1.4-1.8.  Creatinine had increased to 4.83.  She was noted to have some bladder  outlet obstruction that was relieved with in and out catheterization. Nephrology following renally dose medications as appropriate and hold nonessential medications.  Renal ultrasound shows right-sided hydronephrosis with possible stone.  CT renal stone study confirmed right hydronephrosis with obstructing stone.  Seen by urology and underwent double-J stent placement.  Follow-up creatinine continues to improve back to baseline.    Follow-up with urology in the next 2 weeks.  Restart Aldactone as an outpatient if renal function remained stable. Continue to monitor. 4. Hypothyroidism-resume home levothyroxine. 5. Essential hypertension-holding home spironolactone. 6. Cerebral palsy with cognitive developmental delay- resume home Zyprexa and Thorazine a 7. GERD-Protonix ordered for GI protection.  Discharge Instructions  Discharge Instructions    Diet - low sodium heart healthy   Complete by:  As directed    Increase activity slowly   Complete by:  As directed      Allergies as of 04/25/2018      Reactions   Macrobid [nitrofurantoin Monohyd Macro] Hives, Swelling   Penicillins Swelling, Rash   Has patient had a PCN reaction causing immediate rash, facial/tongue/throat swelling, SOB or lightheadedness with hypotension: Yes Has patient had a PCN reaction causing severe rash involving mucus membranes or skin necrosis: Yes Has patient had a PCN reaction that required hospitalization: no Has patient had a PCN reaction occurring within the last 10 years: no If all of the above answers are "NO", then may proceed with Cephalosporin use. Patient tolerated rocephin and ertapenem in the past      Medication List    STOP taking these medications   spironolactone 50 MG tablet Commonly known as:  ALDACTONE     TAKE these medications   acetaminophen 500 MG tablet Commonly known as:  TYLENOL Take 500-1,000 mg by mouth every 6 (six) hours  as needed (for pain.).   ALPRAZolam 0.5 MG tablet Commonly  known as:  XANAX Take 1 tablet by mouth daily as needed for anxiety.   aspirin EC 81 MG tablet Take 81 mg by mouth every morning.   busPIRone 10 MG tablet Commonly known as:  BUSPAR Take 10 mg by mouth 2 (two) times daily.   chlorproMAZINE 25 MG tablet Commonly known as:  THORAZINE Take 25 mg by mouth 2 (two) times daily. *May take 25mg  three times daily as prescribed*   escitalopram 20 MG tablet Commonly known as:  LEXAPRO Take 20 mg by mouth at bedtime.   feeding supplement (NEPRO CARB STEADY) Liqd Take 237 mLs by mouth 3 (three) times daily as needed (Supplement).   fluconazole 150 MG tablet Commonly known as:  DIFLUCAN Take 150 mg by mouth See admin instructions. Prescribed on 04/15/2018-take one tablet by mouth once; repeat in 72 hours if still symptomatic   fosfomycin 3 g Pack Commonly known as:  MONUROL Take 3 g by mouth every other day. Start taking on:  April 26, 2018   levothyroxine 50 MCG tablet Commonly known as:  SYNTHROID Take 50 mcg by mouth daily before breakfast.   OLANZapine 20 MG tablet Commonly known as:  ZYPREXA Take 20 mg by mouth daily.   omeprazole 20 MG capsule Commonly known as:  PRILOSEC Take 20 mg by mouth daily before breakfast.   oxybutynin 10 MG 24 hr tablet Commonly known as:  DITROPAN-XL Take 10 mg by mouth every morning.   senna-docusate 8.6-50 MG tablet Commonly known as:  Senokot-S Take 1 tablet by mouth 2 (two) times daily. What changed:    when to take this  reasons to take this   sodium bicarbonate 650 MG tablet Take 1 tablet (650 mg total) by mouth 2 (two) times daily.   zolpidem 10 MG tablet Commonly known as:  AMBIEN Take 10 mg by mouth at bedtime.      Follow-up Information    [facility].   Why:  Please make a hospital follow up appointment as soon as you can.  I attempted to get you an appointment, but they were closed on Friday. Contact information: [address] [place] [address] [phone]        Cleon Gustin, MD Follow up.   Specialty:  Urology Why:  call for appointment in 2 weeks Contact information: [address] [place] [address] [phone]          Allergies  Allergen Reactions  . Macrobid [Nitrofurantoin Monohyd Macro] Hives and Swelling  . Penicillins Swelling and Rash    Has patient had a PCN reaction causing immediate rash, facial/tongue/throat swelling, SOB or lightheadedness with hypotension: Yes Has patient had a PCN reaction causing severe rash involving mucus membranes or skin necrosis: Yes Has patient had a PCN reaction that required hospitalization: no Has patient had a PCN reaction occurring within the last 10 years: no If all of the above answers are "NO", then may proceed with Cephalosporin use. Patient tolerated rocephin and ertapenem in the past    Consultations:  Nephrology  Urology   Procedures/Studies: Dg Chest 1 View  Result Date: 04/20/2018 CLINICAL DATA:  Altered mental status for 1 week. Nausea and vomiting for 1 day. EXAM: CHEST  1 VIEW COMPARISON:  Chest x-rays dated 01/20/2016 and 01/19/2016 FINDINGS: Power port in place with the tip in the superior vena cava at the level  of the carina. Heart size is within normal limits considering the AP portable technique and the shallow inspiration. Pulmonary vascularity is normal. No discrete infiltrates or effusions. IMPRESSION: No acute abnormalities. Electronically Signed   By: Lorriane Shire M.D.   On: 04/20/2018 14:44   Ct Head Wo Contrast  Result Date: 04/20/2018 CLINICAL DATA:  68 year old female with altered mental status. EXAM: CT HEAD WITHOUT CONTRAST TECHNIQUE: Contiguous axial images were obtained from the base of the skull through the vertex without intravenous contrast. COMPARISON:  10/17/2015 CT FINDINGS: Brain: No evidence of acute infarction, hemorrhage, hydrocephalus, extra-axial collection or mass lesion/mass  effect. Atrophy, mild chronic small-vessel white matter ischemic changes and bifrontal encephalomalacia again noted. Vascular: Atherosclerotic calcifications noted. Skull: Normal. Negative for fracture or focal lesion. Sinuses/Orbits: No acute finding. Other: None. IMPRESSION: 1. No acute intracranial abnormality. 2. Atrophy, mild chronic small-vessel white matter ischemic changes and bifrontal encephalomalacia. Electronically Signed   By: Margarette Canada M.D.   On: 04/20/2018 15:07   US Renal  Result Date: 04/21/2018 CLINICAL DATA:  Acute kidney injury. EXAM: RENAL / URINARY TRACT ULTRASOUND COMPLETE COMPARISON:  Ultrasound of June 21, 2016. FINDINGS: Right Kidney: Renal measurements: 11.9 x 5.8 x 8.1 cm = volume: 297 mL. Moderate hydronephrosis is noted. Possible 14 mm calculus seen in lower pole collecting system. Echogenicity within normal limits. No mass visualized. Left Kidney: Renal measurements: 9.5 x 5.1 x 4.9 cm = volume: 125 mL. Echogenicity within normal limits. No mass or hydronephrosis visualized. Bladder: Not visualized. IMPRESSION: Moderate right hydronephrosis is noted with possible nonobstructive calculus seen in lower pole collecting system of right kidney. Left kidney is unremarkable. Electronically Signed   By: Marijo Conception M.D.   On: 04/21/2018 12:23   Dg C-arm 1-60 Min-no Report  Result Date: 04/22/2018 Fluoroscopy was utilized by the requesting physician.  No radiographic interpretation.   Ct Renal Stone Study  Result Date: 04/22/2018 CLINICAL DATA:  Hydronephrosis EXAM: CT ABDOMEN AND PELVIS WITHOUT CONTRAST TECHNIQUE: Multidetector CT imaging of the abdomen and pelvis was performed following the standard protocol without IV contrast. COMPARISON:  01/21/2016 FINDINGS: Lower chest: No acute abnormality. Hepatobiliary: No focal liver abnormality is seen. No gallstones, gallbladder wall thickening, or biliary dilatation. Pancreas: No pancreatic mass or peripancreatic inflammatory  changes. Fatty atrophy of the pancreas. Spleen: Normal in size without focal abnormality. Adrenals/Urinary Tract: Normal adrenal glands. 9 mm distal right ureteral calculus resulting in severe right hydroureteronephrosis. Bilateral nonobstructing renal calculi. Atrophic left kidney. Normal bladder. Stomach/Bowel: Stomach is within normal limits. No evidence of bowel wall thickening, distention, or inflammatory changes. Vascular/Lymphatic: Abdominal aortic atherosclerosis. No lymphadenopathy. Reproductive: Uterus and bilateral adnexa are unremarkable. Other: No abdominal wall hernia or abnormality. No abdominopelvic ascites. Musculoskeletal: No acute osseous abnormality. No aggressive osseous lesion. Grade 1 anterolisthesis of L4 on L5 secondary to facet disease. Bilateral foraminal stenosis at L4-5. Degenerative disc disease with disc height loss at L3-4, L4-5 and L5-S1. IMPRESSION: 1. 9 mm distal right ureteral calculus resulting in severe right hydroureteronephrosis. 2. Bilateral nonobstructing renal calculi. 3. Atrophic left kidney. 4.  Aortic Atherosclerosis (ICD10-I70.0). Electronically Signed   By: Kathreen Devoid   On: 04/22/2018 14:17       Subjective: Patient is nonverbal,, but shakes her head no when asked about any complaints  Discharge Exam: Vitals:   04/25/18 0632 04/25/18 0632 04/25/18 1421 04/25/18 1446  BP: (!) 115/53 (!) 115/53 (!) 123/57   Pulse: 72 72 70   Resp:  18 17  Temp: 98.3 F (36.8 C) 98.3 F (36.8 C) 97.9 F (36.6 C)   TempSrc: Oral Oral Oral   SpO2: 95% 95% 97% 95%  Weight:      Height:        General: Pt is awake, not in acute distress Cardiovascular: RRR, S1/S2 +, no rubs, no gallops Respiratory: CTA bilaterally, no wheezing, no rhonchi Abdominal: Soft, NT, ND, bowel sounds + Extremities: no edema, no cyanosis    The results of significant diagnostics from this hospitalization (including imaging, microbiology, ancillary and laboratory) are listed below  for reference.     Microbiology: Recent Results (from the past 240 hour(s))  Urine culture     Status: Abnormal   Collection Time: 04/20/18  1:50 PM  Result Value Ref Range Status   Specimen Description   Final    URINE, CATHETERIZED Performed at [facility], [address], [place], [place] [address]    Special Requests   Final    NONE Performed at [facility], [address] [place], [place] [address]    Culture >=100,000 COLONIES/mL ENTEROCOCCUS FAECALIS (A)  Final   Report Status 04/25/2018 FINAL  Final   Organism ID, Bacteria ENTEROCOCCUS FAECALIS (A)  Final      Susceptibility   Enterococcus faecalis - MIC*    AMPICILLIN <=2 SENSITIVE Sensitive     LEVOFLOXACIN 0.25 SENSITIVE Sensitive     NITROFURANTOIN <=16 SENSITIVE Sensitive     VANCOMYCIN <=0.5 SENSITIVE Sensitive     * >=100,000 COLONIES/mL ENTEROCOCCUS FAECALIS  Culture, blood (routine x 2)     Status: None   Collection Time: 04/20/18  5:11 PM  Result Value Ref Range Status   Specimen Description LEFT ANTECUBITAL  Final   Special Requests   Final    BOTTLES DRAWN AEROBIC AND ANAEROBIC Blood Culture adequate volume   Culture   Final    NO GROWTH 5 DAYS Performed at [facility], [address] [place], [place] [address]    Report Status 04/25/2018 FINAL  Final  Culture, blood (routine x 2)     Status: Abnormal   Collection Time: 04/20/18  5:15 PM  Result Value Ref Range Status   Specimen Description   Final    PORTA CATH Performed at [facility], [address], [place], [place] [address]    Special Requests   Final    BOTTLES DRAWN AEROBIC AND ANAEROBIC Blood Culture adequate volume Performed at [facility], [address] [place], [place] [address]    Culture  Setup Time   Final    GRAM POSITIVE COCCI IN BOTH AEROBIC AND ANAEROBIC BOTTLES Gram Stain Report Called to,Read Back By and Verified With: COX,D AT 4010 ON 4.14.20 BY ISLEY,B Performed at [place], READ  BACK BY AND VERIFIED WITH: R PATE RN 04/21/18 1948 JDW    Culture (A)  Final    STAPHYLOCOCCUS SPECIES (COAGULASE NEGATIVE) THE SIGNIFICANCE OF ISOLATING THIS ORGANISM FROM A SINGLE SET OF BLOOD CULTURES WHEN MULTIPLE SETS ARE DRAWN IS UNCERTAIN. PLEASE NOTIFY THE MICROBIOLOGY DEPARTMENT WITHIN ONE WEEK IF SPECIATION AND SENSITIVITIES ARE REQUIRED. Performed at [facility], [place] [address], [place],  [address]    Report Status 04/23/2018 FINAL  Final  Blood Culture ID Panel (Reflexed)     Status: Abnormal   Collection Time: 04/20/18  5:15 PM  Result Value Ref Range Status   Enterococcus species NOT DETECTED NOT DETECTED Final   Listeria monocytogenes NOT DETECTED NOT  DETECTED Final   Staphylococcus species DETECTED (A) NOT DETECTED Final    Comment: Methicillin (oxacillin) resistant coagulase negative staphylococcus. Possible blood culture contaminant (unless isolated from more than one blood culture draw or clinical case suggests pathogenicity). No antibiotic treatment is indicated for blood  culture contaminants. CRITICAL RESULT CALLED TO, READ BACK BY AND VERIFIED WITH: R PATE RN 04/21/18 1948 JDW    Staphylococcus aureus (BCID) NOT DETECTED NOT DETECTED Final   Methicillin resistance DETECTED (A) NOT DETECTED Final    Comment: CRITICAL RESULT CALLED TO, READ BACK BY AND VERIFIED WITH: R PATE RN 04/21/18 1948 JDW    Streptococcus species NOT DETECTED NOT DETECTED Final   Streptococcus agalactiae NOT DETECTED NOT DETECTED Final   Streptococcus pneumoniae NOT DETECTED NOT DETECTED Final   Streptococcus pyogenes NOT DETECTED NOT DETECTED Final   Acinetobacter baumannii NOT DETECTED NOT DETECTED Final   Enterobacteriaceae species NOT DETECTED NOT DETECTED Final   Enterobacter cloacae complex NOT DETECTED NOT DETECTED Final   Escherichia coli NOT DETECTED NOT DETECTED Final   Klebsiella oxytoca NOT DETECTED NOT DETECTED Final   Klebsiella pneumoniae NOT DETECTED NOT DETECTED  Final   Proteus species NOT DETECTED NOT DETECTED Final   Serratia marcescens NOT DETECTED NOT DETECTED Final   Haemophilus influenzae NOT DETECTED NOT DETECTED Final   Neisseria meningitidis NOT DETECTED NOT DETECTED Final   Pseudomonas aeruginosa NOT DETECTED NOT DETECTED Final   Candida albicans NOT DETECTED NOT DETECTED Final   Candida glabrata NOT DETECTED NOT DETECTED Final   Candida krusei NOT DETECTED NOT DETECTED Final   Candida parapsilosis NOT DETECTED NOT DETECTED Final   Candida tropicalis NOT DETECTED NOT DETECTED Final    Comment: Performed at [facility], [place]. [address], [place], [place] [address]     Labs: BNP (last 3 results) No results for input(s): BNP in the last 8760 hours. Basic Metabolic Panel: Recent Labs  Lab 04/20/18 1350 04/20/18 1357 04/21/18 0504 04/22/18 0529 04/23/18 0531 04/24/18 0513 04/25/18 0554  NA 140  --  139  --  140 140 140  K 4.9  --  5.0  --  5.1 5.0 4.7  CL 110  --  110  --  112* 112* 109  CO2 20*  --  19*  --  20* 20* 22  GLUCOSE 128*  --  104*  --  88 95 134*  BUN 55*  --  51*  --  35* 30* 24*  CREATININE 4.83*  --  4.85* 4.53* 3.15* 2.37*  2.33* 1.76*  CALCIUM 8.6*  --  8.0*  --  8.3* 8.2* 8.1*  MG  --  1.4* 1.9  --   --   --   --   PHOS  --   --   --   --  3.5 3.8 3.1   Liver Function Tests: Recent Labs  Lab 04/20/18 1350 04/23/18 0531 04/24/18 0513 04/25/18 0554  AST 11*  --   --   --   ALT 10  --   --   --   ALKPHOS 75  --   --   --   BILITOT 0.8  --   --   --   PROT 6.5  --   --   --   ALBUMIN 3.5 2.9* 2.9* 2.9*   No results for input(s): LIPASE, AMYLASE in the last 168 hours. No results for input(s): AMMONIA in the last 168 hours. CBC: Recent Labs  Lab 04/20/18 1350 04/21/18 0504  04/23/18 0531  WBC 8.9 7.3 6.4  NEUTROABS 6.4  --   --   HGB 11.5* 10.7* 10.9*  HCT 36.7 34.8* 34.6*  MCV 95.1 96.7 96.1  PLT 168 181 207   Cardiac Enzymes: Recent Labs  Lab 04/20/18 1350  TROPONINI <0.03    BNP: Invalid input(s): POCBNP CBG: No results for input(s): GLUCAP in the last 168 hours. D-Dimer No results for input(s): DDIMER in the last 72 hours. Hgb A1c No results for input(s): HGBA1C in the last 72 hours. Lipid Profile No results for input(s): CHOL, HDL, LDLCALC, TRIG, CHOLHDL, LDLDIRECT in the last 72 hours. Thyroid function studies No results for input(s): TSH, T4TOTAL, T3FREE, THYROIDAB in the last 72 hours.  Invalid input(s): FREET3 Anemia work up No results for input(s): VITAMINB12, FOLATE, FERRITIN, TIBC, IRON, RETICCTPCT in the last 72 hours. Urinalysis    Component Value Date/Time   COLORURINE YELLOW 04/20/2018 1350   APPEARANCEUR TURBID (A) 04/20/2018 1350   LABSPEC 1.021 04/20/2018 1350   PHURINE 5.0 04/20/2018 1350   GLUCOSEU NEGATIVE 04/20/2018 1350   HGBUR LARGE (A) 04/20/2018 1350   BILIRUBINUR NEGATIVE 04/20/2018 1350   KETONESUR NEGATIVE 04/20/2018 1350   PROTEINUR 100 (A) 04/20/2018 1350   UROBILINOGEN 1.0 02/11/2011 2035   NITRITE NEGATIVE 04/20/2018 1350   LEUKOCYTESUR MODERATE (A) 04/20/2018 1350   Sepsis Labs Invalid input(s): PROCALCITONIN,  WBC,  LACTICIDVEN Microbiology Recent Results (from the past 240 hour(s))  Urine culture     Status: Abnormal   Collection Time: 04/20/18  1:50 PM  Result Value Ref Range Status   Specimen Description   Final    URINE, CATHETERIZED Performed at [facility], [address] [place], [place] [address]    Special Requests   Final    NONE Performed at [facility], [address] [place], [place] [address]    Culture >=100,000 COLONIES/mL ENTEROCOCCUS FAECALIS (A)  Final   Report Status 04/25/2018 FINAL  Final   Organism ID, Bacteria ENTEROCOCCUS FAECALIS (A)  Final      Susceptibility   Enterococcus faecalis - MIC*    AMPICILLIN <=2 SENSITIVE Sensitive     LEVOFLOXACIN 0.25 SENSITIVE Sensitive     NITROFURANTOIN <=16 SENSITIVE Sensitive     VANCOMYCIN <=0.5 SENSITIVE Sensitive     * >=100,000  COLONIES/mL ENTEROCOCCUS FAECALIS  Culture, blood (routine x 2)     Status: None   Collection Time: 04/20/18  5:11 PM  Result Value Ref Range Status   Specimen Description LEFT ANTECUBITAL  Final   Special Requests   Final    BOTTLES DRAWN AEROBIC AND ANAEROBIC Blood Culture adequate volume   Culture   Final    NO GROWTH 5 DAYS Performed at [facility], [address] [place], [place] [address]    Report Status 04/25/2018 FINAL  Final  Culture, blood (routine x 2)     Status: Abnormal   Collection Time: 04/20/18  5:15 PM  Result Value Ref Range Status   Specimen Description   Final    PORTA CATH Performed at [facility], [address], [place], [place] [address]    Special Requests   Final    BOTTLES DRAWN AEROBIC AND ANAEROBIC Blood Culture adequate volume Performed at [facility], [address], [place], [place] [address]    Culture  Setup Time   Final    GRAM POSITIVE COCCI IN BOTH AEROBIC AND ANAEROBIC BOTTLES Gram Stain Report Called to,Read Back By and Verified With: COX,D AT 1605 ON  4.14.20 BY ISLEY,B Performed at [place], READ BACK BY AND VERIFIED WITH: R PATE RN 04/21/18 1948 JDW    Culture (A)  Final    STAPHYLOCOCCUS SPECIES (COAGULASE NEGATIVE) THE SIGNIFICANCE OF ISOLATING THIS ORGANISM FROM A SINGLE SET OF BLOOD CULTURES WHEN MULTIPLE SETS ARE DRAWN IS UNCERTAIN. PLEASE NOTIFY THE MICROBIOLOGY DEPARTMENT WITHIN ONE WEEK IF SPECIATION AND SENSITIVITIES ARE REQUIRED. Performed at [facility], [place] [address] [place], [place] [address]    Report Status 04/23/2018 FINAL  Final  Blood Culture ID Panel (Reflexed)     Status: Abnormal   Collection Time: 04/20/18  5:15 PM  Result Value Ref Range Status   Enterococcus species NOT DETECTED NOT DETECTED Final   Listeria monocytogenes NOT DETECTED NOT DETECTED Final   Staphylococcus species DETECTED (A) NOT DETECTED Final    Comment: Methicillin (oxacillin) resistant coagulase  negative staphylococcus. Possible blood culture contaminant (unless isolated from more than one blood culture draw or clinical case suggests pathogenicity). No antibiotic treatment is indicated for blood  culture contaminants. CRITICAL RESULT CALLED TO, READ BACK BY AND VERIFIED WITH: R PATE RN 04/21/18 1948 JDW    Staphylococcus aureus (BCID) NOT DETECTED NOT DETECTED Final   Methicillin resistance DETECTED (A) NOT DETECTED Final    Comment: CRITICAL RESULT CALLED TO, READ BACK BY AND VERIFIED WITH: R PATE RN 04/21/18 1948 JDW    Streptococcus species NOT DETECTED NOT DETECTED Final   Streptococcus agalactiae NOT DETECTED NOT DETECTED Final   Streptococcus pneumoniae NOT DETECTED NOT DETECTED Final   Streptococcus pyogenes NOT DETECTED NOT DETECTED Final   Acinetobacter baumannii NOT DETECTED NOT DETECTED Final   Enterobacteriaceae species NOT DETECTED NOT DETECTED Final   Enterobacter cloacae complex NOT DETECTED NOT DETECTED Final   Escherichia coli NOT DETECTED NOT DETECTED Final   Klebsiella oxytoca NOT DETECTED NOT DETECTED Final   Klebsiella pneumoniae NOT DETECTED NOT DETECTED Final   Proteus species NOT DETECTED NOT DETECTED Final   Serratia marcescens NOT DETECTED NOT DETECTED Final   Haemophilus influenzae NOT DETECTED NOT DETECTED Final   Neisseria meningitidis NOT DETECTED NOT DETECTED Final   Pseudomonas aeruginosa NOT DETECTED NOT DETECTED Final   Candida albicans NOT DETECTED NOT DETECTED Final   Candida glabrata NOT DETECTED NOT DETECTED Final   Candida krusei NOT DETECTED NOT DETECTED Final   Candida parapsilosis NOT DETECTED NOT DETECTED Final   Candida tropicalis NOT DETECTED NOT DETECTED Final    Comment: Performed at [facility], [place]. [address], [place], [place] [address]     Time coordinating discharge: 71mins  SIGNED:   Kathie Dike, MD  [facility] 04/25/2018, 7:09 PM   If 7PM-7AM, please contact night-coverage [email or web address]

## 2018-04-26 ENCOUNTER — Encounter (HOSPITAL_COMMUNITY): Payer: Self-pay

## 2018-04-28 ENCOUNTER — Telehealth: Payer: Self-pay | Admitting: *Deleted

## 2018-04-29 ENCOUNTER — Other Ambulatory Visit: Payer: Self-pay

## 2018-04-29 ENCOUNTER — Emergency Department (HOSPITAL_COMMUNITY): Payer: Medicare Other

## 2018-04-29 ENCOUNTER — Emergency Department (HOSPITAL_COMMUNITY)
Admission: EM | Admit: 2018-04-29 | Discharge: 2018-04-29 | Disposition: A | Discharge status: Home / Self Care | Payer: Medicare Other | Attending: Emergency Medicine | Admitting: Emergency Medicine

## 2018-04-29 ENCOUNTER — Encounter (HOSPITAL_COMMUNITY): Payer: Self-pay | Admitting: Emergency Medicine

## 2018-04-29 DIAGNOSIS — I129 Hypertensive chronic kidney disease with stage 1 through stage 4 chronic kidney disease, or unspecified chronic kidney disease: Secondary | ICD-10-CM | POA: Diagnosis not present

## 2018-04-29 DIAGNOSIS — N182 Chronic kidney disease, stage 2 (mild): Secondary | ICD-10-CM | POA: Diagnosis not present

## 2018-04-29 DIAGNOSIS — E1122 Type 2 diabetes mellitus with diabetic chronic kidney disease: Secondary | ICD-10-CM | POA: Insufficient documentation

## 2018-04-29 DIAGNOSIS — Z79899 Other long term (current) drug therapy: Secondary | ICD-10-CM | POA: Insufficient documentation

## 2018-04-29 DIAGNOSIS — Z7982 Long term (current) use of aspirin: Secondary | ICD-10-CM | POA: Insufficient documentation

## 2018-04-29 DIAGNOSIS — R0602 Shortness of breath: Secondary | ICD-10-CM | POA: Insufficient documentation

## 2018-04-29 DIAGNOSIS — E039 Hypothyroidism, unspecified: Secondary | ICD-10-CM | POA: Insufficient documentation

## 2018-04-29 LAB — COMPREHENSIVE METABOLIC PANEL
ALT: 25 U/L (ref 0–44)
AST: 21 U/L (ref 15–41)
Albumin: 3.6 g/dL (ref 3.5–5.0)
Alkaline Phosphatase: 56 U/L (ref 38–126)
Anion gap: 10 (ref 5–15)
BUN: 25 mg/dL — ABNORMAL HIGH (ref 8–23)
CO2: 23 mmol/L (ref 22–32)
Calcium: 8.7 mg/dL — ABNORMAL LOW (ref 8.9–10.3)
Chloride: 106 mmol/L (ref 98–111)
Creatinine, Ser: 1.71 mg/dL — ABNORMAL HIGH (ref 0.44–1.00)
GFR calc Af Amer: 36 mL/min — ABNORMAL LOW (ref >60)
GFR calc non Af Amer: 31 mL/min — ABNORMAL LOW (ref >60)
Glucose, Bld: 123 mg/dL — ABNORMAL HIGH (ref 70–99)
Potassium: 4.9 mmol/L (ref 3.5–5.1)
Sodium: 139 mmol/L (ref 135–145)
Total Bilirubin: 0.3 mg/dL (ref 0.3–1.2)
Total Protein: 6.8 g/dL (ref 6.5–8.1)

## 2018-04-29 LAB — URINALYSIS, ROUTINE W REFLEX MICROSCOPIC
Bilirubin Urine: NEGATIVE
Glucose, UA: NEGATIVE mg/dL
Ketones, ur: NEGATIVE mg/dL
Nitrite: NEGATIVE
Protein, ur: 30 mg/dL — AB
RBC / HPF: >50 RBC/hpf — ABNORMAL HIGH (ref 0–5)
Specific Gravity, Urine: 1.012 (ref 1.005–1.030)
pH: 7 (ref 5.0–8.0)

## 2018-04-29 LAB — CBC WITH DIFFERENTIAL/PLATELET
Abs Immature Granulocytes: 0.07 10*3/uL (ref 0.00–0.07)
Basophils Absolute: 0.1 10*3/uL (ref 0.0–0.1)
Basophils Relative: 2 %
Eosinophils Absolute: 0.1 10*3/uL (ref 0.0–0.5)
Eosinophils Relative: 1 %
HCT: 37.3 % (ref 36.0–46.0)
Hemoglobin: 11.8 g/dL — ABNORMAL LOW (ref 12.0–15.0)
Immature Granulocytes: 1 %
Lymphocytes Relative: 17 %
Lymphs Abs: 1.2 10*3/uL (ref 0.7–4.0)
MCH: 30.3 pg (ref 26.0–34.0)
MCHC: 31.6 g/dL (ref 30.0–36.0)
MCV: 95.6 fL (ref 80.0–100.0)
Monocytes Absolute: 0.6 10*3/uL (ref 0.1–1.0)
Monocytes Relative: 9 %
Neutro Abs: 5.3 10*3/uL (ref 1.7–7.7)
Neutrophils Relative %: 70 %
Platelets: 246 10*3/uL (ref 150–400)
RBC: 3.9 MIL/uL (ref 3.87–5.11)
RDW: 13.6 % (ref 11.5–15.5)
WBC: 7.3 10*3/uL (ref 4.0–10.5)
nRBC: 0 % (ref 0.0–0.2)

## 2018-04-29 LAB — TROPONIN I: Troponin I: <0.03 ng/mL (ref <0.03)

## 2018-04-29 MED ORDER — IOHEXOL 350 MG/ML SOLN
100.0000 mL | Freq: Once | INTRAVENOUS | Status: AC | PRN
  Administered 2018-04-29: 19:00:00 75 mL via INTRAVENOUS

## 2018-04-29 MED ORDER — HEPARIN SOD (PORK) LOCK FLUSH 100 UNIT/ML IV SOLN
INTRAVENOUS | Status: AC
  Filled 2018-04-29: qty 5

## 2018-04-29 NOTE — ED Provider Notes (Signed)
HAND OFF FROM  A. HERNANDEZ, PA-C AT END OF SHIFT.      5:20PM - NO blood return from vascular access port. Attempting to start a peripheral  IV. Vascular access team consulted. They have 2 cases ahead of this case on another campus.    Nursing AC able to obtain IV access.   CT reports being backed up, there will be a delay in obtaining study.  6:50pm Pt taken to CT suite.  CT scan is negative for PE or any other acute cardiopulmonary problem.  Pulse oximetry on room air is 92 to 96%.  Feel that it is safe for the patient to be discharged home.  Patient in no distress at this time.   Lily Kocher, PA-C 04/29/18 [place], [place], DO 05/04/18 [phone]

## 2018-04-29 NOTE — ED Notes (Signed)
AC able to obtain IV access . CT notified

## 2018-04-29 NOTE — Progress Notes (Signed)
Spoke with Shirlean Mylar, RN regarding PAC.  Port was accessed then reaccessed due to lack of blood return, flushes with ease.  Per Shirlean Mylar back of port felt with reaccess.  Discussed that at this point TPA of port is next step.  Per Shirlean Mylar they do not TPA.  AC was called and reported that they do not do TPA.  Writer called ICU and ICU reports that they do not TPA lines.  Spoke with Marianna Payment, RN AD VAST who states that [facility]AST does not TPA at [facility] and that RN would need to reach out to leadership as there are staff that have been trained to TPA clotted/no blood return lines.  Robin verbalized understanding and stated that she would have AC try to start line for scan.  Carolee Rota, RN VAST

## 2018-04-29 NOTE — ED Notes (Signed)
[phone] [name] (Sister, [place])

## 2018-04-29 NOTE — ED Notes (Signed)
Updated [name] on patient status. Stated patient still needed to go to CT and it would be a while before we got any results.

## 2018-04-29 NOTE — ED Triage Notes (Signed)
Pt sent home from [facility] on Saturday with UTI and stent placement. Pt's family called out today for increased SOB especially with exertion and fatigue. Pt hx of cerebral palsy. Family denies fever/cough.

## 2018-04-29 NOTE — ED Notes (Addendum)
Spoke with vascular team carrie. Explained inability to obtain blood and access atttempts due to need for CT angio. Team has 2 cases at [facility] and it will be an undetermined amount of time before they can get here. Asked if we could give TPA thru it per [facility] no. AC will attempt to start peripheral

## 2018-04-29 NOTE — Discharge Instructions (Addendum)
Your kidney function test are within normal limits. Your urine test shows the urinary tract infection is improving nicely. EKG and heart enzymes are negative for acute heart problem. CT scan of the lungs negative for blood clots or pneumonia or other acute problem.  You have been seen today for shortness of breath. Please read and follow all provided instructions. Please return if any new changes or worsening of your condition.  1. Medications: usual home medications 2. Treatment: rest, drink plenty of fluids 3. Follow Up: Please follow up with your primary doctor in 2 days for discussion of your diagnoses and further evaluation after today's visit; if you do not have a primary care doctor use the resource guide provided to find one; Please return to the ER for any new or worsening symptoms. Please obtain all of your results from medical records or have your doctors office obtain the results - share them with your doctor - you should be seen at your doctors office. Call today to arrange your follow up.   Take medications as prescribed. Please review all of the medicines and only take them if you do not have an allergy to them. Return to the emergency room for worsening condition or new concerning symptoms. Follow up with your regular doctor. If you don't have a regular doctor use one of the numbers below to establish a primary care doctor.  Please be aware that if you are taking birth control pills, taking other prescriptions, ESPECIALLY ANTIBIOTICS may make the birth control ineffective - if this is the case, either do not engage in sexual activity or use alternative methods of birth control such as condoms until you have finished the medicine and your family doctor says it is OK to restart them. If you are on a blood thinner such as COUMADIN, be aware that any other medicine that you take may cause the coumadin to either work too much, or not enough - you should have your coumadin level rechecked in next 7  days if this is the case.  ?  It is also a possibility that you have an allergic reaction to any of the medicines that you have been prescribed - Everybody reacts differently to medications and while MOST people have no trouble with most medicines, you may have a reaction such as nausea, vomiting, rash, swelling, shortness of breath. If this is the case, please stop taking the medicine immediately and contact your physician.  ?  You should return to the ER if you develop severe or worsening symptoms.   Emergency Department Resource Guide 1) Find a Doctor and Pay Out of Pocket Although you won't have to find out who is covered by your insurance plan, it is a good idea to ask around and get recommendations. You will then need to call the office and see if the doctor you have chosen will accept you as a new patient and what types of options they offer for patients who are self-pay. Some doctors offer discounts or will set up payment plans for their patients who do not have insurance, but you will need to ask so you aren't surprised when you get to your appointment.  2) Contact Your Local Health Department Not all health departments have doctors that can see patients for sick visits, but many do, so it is worth a call to see if yours does. If you don't know where your local health department is, you can check in your phone book. The CDC also has a  tool to help you locate your state's health department, and many state websites also have listings of all of their local health departments.  3) Find a [facility] If your illness is not likely to be very severe or complicated, you may want to try a walk in clinic. These are popping up all over the country in pharmacies, drugstores, and shopping centers. They're usually staffed by nurse practitioners or physician assistants that have been trained to treat common illnesses and complaints. They're usually fairly quick and inexpensive. However, if you have  serious medical issues or chronic medical problems, these are probably not your best option.  No Primary Care Doctor: Call Health Connect at  [phone] - they can help you locate a primary care doctor that  accepts your insurance, provides certain services, etc. Physician Referral Service- [phone]  Chronic Pain Problems: Organization         Address  Phone   Notes  [facility]  [phone] Patients need to be referred by their primary care doctor.   Medication Assistance: Organization         Address  Phone   Notes  [facility] Medication [facility] [address] [place], [place] [address] [phone] --Must be a resident of [facility] -- Must have NO insurance coverage whatsoever (no Medicaid/ Medicare, etc.) -- The pt. MUST have a primary care doctor that directs their care regularly and follows them in the community   [facility]  [phone]   [employer]  [phone]    Agencies that provide inexpensive medical care: Organization         Address  Phone   Notes  [place]  [phone]   [facility]    [phone]   [facility] [place], [place] [address] [phone]   [place] [address] [place] [phone]   [facility]    [phone]   [facility]    [phone]   [address], [place] Phone:  [phone], Fax:  [phone] Hours of Operation:  9 am - 6 pm, M-F.  Also accepts Medicaid/Medicare and self-pay.  [facility] for [place] [address], [address], [place] Phone: [phone], Fax: [phone]. Hours of Operation:  8:30 am - 5:30 pm, M-F.  Also accepts Medicaid and self-pay.  [facility] [place] [address] [place] Phone: [phone]   [place],  [place], [place] [phone], Ext. [phone] Mondays & Thursdays: 7-9 AM.  First 15 patients are seen on a first come, first serve basis.    [place] Providers:  Organization         Address  Phone   Notes  [facility] [address], [place] [phone] Also accepts self-pay patients.  [facility], [place] [address], [place]  [phone]   [place], [address], [place] [phone]   [facility] [address] [place] [phone]   Lucianne Lei [address], [place]   [phone] Only accepts [place] patients after they have their name applied to their card.   Self-Pay (no insurance) in [facility]:  Organization  Address  Phone   Notes  Sickle Cell Patients, [facility] [place] [phone]   [facility] [place] [phone]   [facility] [place]  [place], [address], [place] [phone]   [facility]/Dr. Osei-Bonsu  [address] [place] or [address], [place] [phone] Phone number for both [place] and [place] locations is the same.  [facility] [address] [place] [phone]   [facility] [address], [place] or [address] [phone]   [facility] [address] [place] [phone], phone; [phone], fax Sees patients 1st and 3rd Saturday of every month.  Must not qualify for public or private insurance (i.e. Medicaid, Medicare, [place] Health Choice, Veterans' Benefits)  Household income should be no more than 200% of the poverty level The clinic cannot treat you if you are pregnant or think you are pregnant  Sexually  transmitted diseases are not treated at the clinic.

## 2018-04-29 NOTE — ED Provider Notes (Signed)
[facility] EMERGENCY DEPARTMENT Provider Note   CSN: [id number] Arrival date & time: 04/29/18  1230    History   Chief Complaint Chief Complaint  Patient presents with  . Shortness of Breath    HPI Rachel Morgan is a 68 y.o. female with a PMH of cerebral palsy, anxiety, Diabetes, CKD, and HTN presenting with intermittent shortness of breath onset 5 days ago. Patient is a poor historian due to cerebral palsy. Patient is unable to provide history.   Level 5 caveat.   Called niece to gather additional information. Niece, Jerene Pitch, reports she is the primary caretaker and is always with the patient. Niece reports patient has had worsening shortness of breath with exertion and fatigue. Niece states [facility] recently at [facility] for a [facility] and had a stent placed. Niece reports she notified patient's PCP and was advised to come to the ER for concern for a blood clot. Niece denies sick exposures, recent travel, cough, congestion, or fever. Niece reports patient has been acting at baseline.     HPI  Past Medical History:  Diagnosis Date  . Anxiety   . Calculus of gallbladder   . Cerebral palsy ([place])   . Chronic kidney disease   . Diabetes mellitus   . Gall stones   . GERD (gastroesophageal reflux disease)   . High cholesterol   . History of kidney stones   . Hypertension   . Hypothyroidism   . Kidney stones   . MR (mental retardation)   . Pyelonephritis   . Sepsis [facility])     Patient Active Problem List   Diagnosis Date Noted  . Acute metabolic encephalopathy 38/17/7116  . Hypomagnesemia 04/21/2018  . UTI (urinary tract infection) 04/20/2018  . PICC (peripherally inserted central catheter) in place   . Urinary tract infection without hematuria   . Hydronephrosis with renal and ureteral calculus obstruction 01/21/2016  . Hyperkalemia 01/21/2016  . Palliative care encounter   . DNR (do not resuscitate) discussion   . Encephalopathy 10/17/2015  . Goals of care,  counseling/discussion 10/17/2015  . Nephrolithiasis 09/27/2015  . Cerebral palsy ([place]) 09/27/2015  . Sepsis ([place]) 09/26/2015  . AKI (acute kidney injury) ([place]) 09/26/2015  . Fever   . Urinary tract infectious disease   . Dilated cbd, acquired 09/14/2015  . Hypothyroidism 09/14/2015  . Hypertension 09/14/2015  . Depression with anxiety 09/14/2015  . UPJ obstruction, acquired 09/14/2015  . CKD (chronic kidney disease), stage III ([place]) 09/14/2015  . GERD (gastroesophageal reflux disease) 09/14/2015  . Calculus of gallbladder without cholecystitis without obstruction   . Abdominal pain, right upper quadrant 02/11/2011  . Acute pyelonephritis 02/11/2011  . Agoraphobia 02/11/2011  . Acute renal failure ([place]) 02/11/2011  . Hyponatremia 02/11/2011  . Dehydration 02/11/2011    Past Surgical History:  Procedure Laterality Date  . CYSTOSCOPY W/ URETERAL STENT PLACEMENT Bilateral 09/26/2015   Procedure: CYSTOSCOPY WITH Bilateral  RETROGRADE PYELOGRAM/URETERAL STENT PLACEMENT;  Surgeon: [name], MD;  Location: [facility];  Service: Urology;  Laterality: Bilateral;  . CYSTOSCOPY W/ URETERAL STENT PLACEMENT Right 04/22/2018   Procedure: CYSTOSCOPY WITH RETROGRADE PYELOGRAM/URETERAL STENT PLACEMENT;  Surgeon: Cleon Gustin, MD;  Location: [facility];  Service: Urology;  Laterality: Right;  . CYSTOSCOPY W/ URETERAL STENT REMOVAL Left 11/10/2015   Procedure: CYSTOSCOPY WITH STENT REMOVAL;  Surgeon: Cleon Gustin, MD;  Location: [facility];  Service: Urology;  Laterality: Left;  . CYSTOSCOPY WITH STENT PLACEMENT Right 01/21/2016   Procedure: CYSTOSCOPY WITH STENT PLACEMENT;  Surgeon: Franchot Gallo, MD;  Location: [facility];  Service: Urology;  Laterality: Right;  . CYSTOSCOPY/RETROGRADE/URETEROSCOPY/STONE EXTRACTION WITH BASKET Left 10/31/2015   Procedure: CYSTOSCOPY/ BILATERAL URETEROSCOPY/BILATERAL STONE EXTRACTION/ LEFT STENT PLACEMENT;  Surgeon: Irine Seal, MD;  Location: [facility];  Service:  Urology;  Laterality: Left;  . CYSTOSCOPY/URETEROSCOPY/HOLMIUM LASER/STENT PLACEMENT Right 02/27/2016   Procedure: CYSTOSCOPY/RIGHT URETEROSCOPY/HOLMIUM LASER/STENT PLACEMENT/STENT REMOVAL;  Surgeon: Irine Seal, MD;  [facility]: [facility];  Service: Urology;  Laterality: Right;  . HOLMIUM LASER APPLICATION N/A 48/18/5631   Procedure: HOLMIUM LASER APPLICATION;  Surgeon: Irine Seal, MD;  Location: [facility];  Service: Urology;  Laterality: N/A;  . IR FLUORO GUIDE PORT INSERTION RIGHT  04/10/2016  . IR US GUIDE VASC ACCESS RIGHT  04/10/2016     OB History   No obstetric history on file.      Home Medications    Prior to Admission medications   Medication Sig Start Date End Date Taking? Authorizing Provider  acetaminophen (TYLENOL) 500 MG tablet Take 500-1,000 mg by mouth every 6 (six) hours as needed (for pain.).    [provider]  ALPRAZolam Duanne Moron) 0.5 MG tablet Take 1 tablet by mouth daily as needed for anxiety.  04/09/18   [provider]  aspirin EC 81 MG tablet Take 81 mg by mouth every morning.     [provider]  busPIRone (BUSPAR) 10 MG tablet Take 10 mg by mouth 2 (two) times daily.     [provider]  chlorproMAZINE (THORAZINE) 25 MG tablet Take 25 mg by mouth 2 (two) times daily. *May take 25mg  three times daily as prescribed*    [provider]  escitalopram (LEXAPRO) 20 MG tablet Take 20 mg by mouth at bedtime.    [provider]  fluconazole (DIFLUCAN) 150 MG tablet Take 150 mg by mouth See admin instructions. Prescribed on 04/15/2018-take one tablet by mouth once; repeat in 72 hours if still symptomatic    [provider]  fosfomycin (MONUROL) 3 g PACK Take 3 g by mouth every other day. 04/26/18   Kathie Dike, MD  levothyroxine (SYNTHROID, LEVOTHROID) 50 MCG tablet Take 50 mcg by mouth daily before breakfast.    [provider]  Nutritional Supplements (FEEDING SUPPLEMENT, NEPRO CARB STEADY,) LIQD Take 237 mLs by  mouth 3 (three) times daily as needed (Supplement). 01/26/16   Sheikh, Omair Latif, DO  OLANZapine (ZYPREXA) 20 MG tablet Take 20 mg by mouth daily.  07/24/15   [provider]  omeprazole (PRILOSEC) 20 MG capsule Take 20 mg by mouth daily before breakfast.     [provider]  oxybutynin (DITROPAN-XL) 10 MG 24 hr tablet Take 10 mg by mouth every morning.     [provider]  senna-docusate (SENOKOT-S) 8.6-50 MG tablet Take 1 tablet by mouth 2 (two) times daily. Patient taking differently: Take 1 tablet by mouth 2 (two) times daily as needed.  01/26/16   Raiford Noble Latif, DO  sodium bicarbonate 650 MG tablet Take 1 tablet (650 mg total) by mouth 2 (two) times daily. 04/25/18   Kathie Dike, MD  zolpidem (AMBIEN) 10 MG tablet Take 10 mg by mouth at bedtime.    [provider]    Family History Family History  Family history unknown: Yes    Social History Social History   Tobacco Use  . Smoking status: Never Smoker  . Smokeless tobacco: Never Used  Substance Use Topics  . Alcohol use: No  . Drug use: No  Allergies   Macrobid [nitrofurantoin monohyd macro] and Penicillins   Review of Systems Review of Systems  Unable to perform ROS: Other (Patient has cerebral palsy.)     Physical Exam Updated Vital Signs BP (!) 108/59   Pulse 75   Temp 98.3 F (36.8 C) (Oral)   Resp 20   Ht 4\' 11"  (1.499 m)   Wt 130 kg   SpO2 96%   BMI 57.89 kg/m   Physical Exam Vitals signs and nursing note reviewed.  Constitutional:      General: She is not in acute distress.    Appearance: She is well-developed. She is obese. She is not diaphoretic.  HENT:     Head: Normocephalic and atraumatic.  Neck:     Musculoskeletal: Normal range of motion and neck supple.     Vascular: No JVD.  Cardiovascular:     Rate and Rhythm: Normal rate and regular rhythm.     Pulses: Normal pulses.          Radial pulses are 2+ on the right side and 2+ on the left  side.       Dorsalis pedis pulses are 2+ on the right side and 2+ on the left side.     Heart sounds: Normal heart sounds. No murmur. No friction rub. No gallop.   Pulmonary:     Effort: Pulmonary effort is normal. No respiratory distress.     Breath sounds: Normal breath sounds. No wheezing, rhonchi or rales.     Comments: Oxygen saturation is 95% on room air.  Chest:     Chest wall: No tenderness.  Abdominal:     Palpations: Abdomen is soft.     Tenderness: There is no abdominal tenderness.  Musculoskeletal: Normal range of motion.     Right lower leg: She exhibits no tenderness. No edema.     Left lower leg: She exhibits no tenderness. No edema.  Skin:    General: Skin is warm.     Capillary Refill: Capillary refill takes less than 2 seconds.     Coloration: Skin is not pale.     Findings: No rash.  Neurological:     Mental Status: She is alert. Mental status is at baseline.     ED Treatments / Results  Labs (all labs ordered are listed, but only abnormal results are displayed) Labs Reviewed  COMPREHENSIVE METABOLIC PANEL - Abnormal; Notable for the following components:      Result Value   Glucose, Bld 123 (*)    BUN 25 (*)    Creatinine, Ser 1.71 (*)    Calcium 8.7 (*)    GFR calc non Af Amer 31 (*)    GFR calc Af Amer 36 (*)    All other components within normal limits  CBC WITH DIFFERENTIAL/PLATELET - Abnormal; Notable for the following components:   Hemoglobin 11.8 (*)    All other components within normal limits  URINALYSIS, ROUTINE W REFLEX MICROSCOPIC - Abnormal; Notable for the following components:   Hgb urine dipstick LARGE (*)    Protein, ur 30 (*)    Leukocytes,Ua SMALL (*)    RBC / HPF >50 (*)    Bacteria, UA RARE (*)    All other components within normal limits  URINE CULTURE  URINE CULTURE  TROPONIN I   BUN  Date Value Ref Range Status  04/29/2018 25 (H) 8 - 23 mg/dL Final  04/25/2018 24 (H) 8 - 23 mg/dL Final  04/24/2018 30 (H)  8 - 23 mg/dL  Final  04/23/2018 35 (H) 8 - 23 mg/dL Final   Creatinine, Ser  Date Value Ref Range Status  04/29/2018 1.71 (H) 0.44 - 1.00 mg/dL Final  04/25/2018 1.76 (H) 0.44 - 1.00 mg/dL Final  04/24/2018 2.37 (H) 0.44 - 1.00 mg/dL Final  04/24/2018 2.33 (H) 0.44 - 1.00 mg/dL Final    EKG EKG Interpretation  Date/Time:  Wednesday April 29 2018 13:09:48 EDT Ventricular Rate:  80 PR Interval:    QRS Duration: 63 QT Interval:  578 QTC Calculation: 667 R Axis:   47 Text Interpretation:  Sinus rhythm Low voltage, precordial leads Borderline T abnormalities, anterior leads Prolonged QT interval Baseline wander in lead(s) V6 Confirmed by Davonna Belling [phone]) on 04/29/2018 2:13:06 PM   Radiology Dg Chest 2 View  Result Date: 04/29/2018 CLINICAL DATA:  Shortness of breath. Cerebral palsy. Recent urinary tract infection. EXAM: CHEST - 2 VIEW COMPARISON:  04/20/2018 FINDINGS: Port-A-Cath tip in the SVC and stable. Lungs are clear. Heart size is enlarged and stable. No acute bone abnormality. Lucency along the left paraspinal region could represent gas in the esophagus. No large pleural effusions. IMPRESSION: No active cardiopulmonary disease. Stable cardiomegaly. Question distended esophagus. Electronically Signed   By: Markus Daft M.D.   On: 04/29/2018 13:39    Procedures Procedures (including critical care time)  Medications Ordered in ED Medications  iohexol (OMNIPAQUE) 350 MG/ML injection 100 mL (has no administration in time range)   Initial Impression / Assessment and Plan / ED Course  I have reviewed the triage vital signs and the nursing notes.  Pertinent labs & imaging results that were available during my care of the patient were reviewed by me and considered in my medical decision making (see chart for details).  Clinical Course as of Apr 28 1641  Wed Apr 29, 2018  1349 CXR reveals no active cardiopulmonary disease. Stable cardiomegaly. Questionable distended esophagus.    DG  Chest 2 View [AH]  1352 UA reveals large Hgb, protein, leukocytes, RBCs, and rare bacteria. Will order urine culture. Patient is not having any urinary symptoms at this time.  Hgb urine dipstick(!): LARGE [AH]  1405 Elevated creatinine at 1.71. This appears to have improved from previous values.   Creatinine(!): 1.71 [AH]  1516 Reassessed patient. Patient appears in no acute distress. Oxygen saturation is 96% on room air.   [AH]  1634 Spoke to Dixon, guardian, and updated her about the results. [name] understands the CTA is still pending.    [AH]    Clinical Course User Index [AH] Arville Lime, [place]      Patient presents with shortness of breath. Patient is in no acute distress. No tracheal deviation, no JVD or new murmur, RRR, breath sounds equal bilaterally, EKG without acute abnormalities, negative troponin, and CXR without active cardiopulmonary disease. Oxygen saturation has remained stable on room air while in the ER. Discussed results with guardian. CTA is pending.   Case has been discussed with Dr. Alvino Chapel.  At shift change care was transferred to [facility], [place] who will follow pending studies, re-evaluate and determine disposition.    Final Clinical Impressions(s) / ED Diagnoses   Final diagnoses:  SOB (shortness of breath)    ED Discharge Orders    None       Julienne Kass 04/29/18 1649    Davonna Belling, MD 04/30/18 [phone]

## 2018-04-29 NOTE — ED Notes (Signed)
Unable to obtain IV. Patient will most likely need ultrasound IV. Placed ultrasound in room for coworker.

## 2018-04-29 NOTE — ED Notes (Signed)
Updated [name] on pt diagnosis, and notified her of patient being transported home via EMS.

## 2018-04-30 LAB — URINE CULTURE: Culture: NO GROWTH

## 2018-05-04 ENCOUNTER — Other Ambulatory Visit (HOSPITAL_COMMUNITY): Payer: Self-pay | Admitting: Family

## 2018-05-04 DIAGNOSIS — T82598A Other mechanical complication of other cardiac and vascular devices and implants, initial encounter: Secondary | ICD-10-CM

## 2018-05-06 ENCOUNTER — Ambulatory Visit (HOSPITAL_COMMUNITY)
Admission: RE | Admit: 2018-05-06 | Discharge: 2018-05-06 | Disposition: A | Discharge status: Home / Self Care | Payer: Medicare Other | Source: Ambulatory Visit | Attending: Family | Admitting: Family

## 2018-05-06 ENCOUNTER — Other Ambulatory Visit: Payer: Self-pay

## 2018-05-06 ENCOUNTER — Encounter (HOSPITAL_COMMUNITY): Payer: Self-pay | Admitting: Interventional Radiology

## 2018-05-06 DIAGNOSIS — T82598A Other mechanical complication of other cardiac and vascular devices and implants, initial encounter: Secondary | ICD-10-CM | POA: Insufficient documentation

## 2018-05-06 DIAGNOSIS — Y839 Surgical procedure, unspecified as the cause of abnormal reaction of the patient, or of later complication, without mention of misadventure at the time of the procedure: Secondary | ICD-10-CM

## 2018-05-06 DIAGNOSIS — J690 Pneumonitis due to inhalation of food and vomit: Secondary | ICD-10-CM | POA: Diagnosis not present

## 2018-05-06 DIAGNOSIS — J189 Pneumonia, unspecified organism: Secondary | ICD-10-CM | POA: Diagnosis not present

## 2018-05-06 HISTORY — PX: IR CV LINE INJECTION: IMG2294

## 2018-05-06 MED ORDER — IOHEXOL 300 MG/ML  SOLN
50.0000 mL | Freq: Once | INTRAMUSCULAR | Status: AC | PRN
  Administered 2018-05-06: 10 mL via INTRAVENOUS

## 2018-05-06 MED ORDER — HEPARIN SOD (PORK) LOCK FLUSH 100 UNIT/ML IV SOLN
INTRAVENOUS | Status: AC
  Filled 2018-05-06: qty 5

## 2018-05-06 MED ORDER — HEPARIN SOD (PORK) LOCK FLUSH 10 UNIT/ML IV SOLN
INTRAVENOUS | Status: DC | PRN
  Administered 2018-05-06: 10 [IU]

## 2018-05-07 ENCOUNTER — Emergency Department (HOSPITAL_COMMUNITY): Payer: Medicare Other

## 2018-05-07 ENCOUNTER — Other Ambulatory Visit: Payer: Self-pay

## 2018-05-07 ENCOUNTER — Encounter (HOSPITAL_COMMUNITY): Payer: Self-pay | Admitting: Emergency Medicine

## 2018-05-07 ENCOUNTER — Inpatient Hospital Stay (HOSPITAL_COMMUNITY)
Admission: EM | Admit: 2018-05-07 | Discharge: 2018-05-11 | DRG: 177 | Disposition: A | Discharge status: Home Health | Payer: Medicare Other | Attending: Family Medicine | Admitting: Family Medicine

## 2018-05-07 DIAGNOSIS — G9340 Encephalopathy, unspecified: Secondary | ICD-10-CM | POA: Diagnosis present

## 2018-05-07 DIAGNOSIS — T426X5A Adverse effect of other antiepileptic and sedative-hypnotic drugs, initial encounter: Secondary | ICD-10-CM | POA: Diagnosis present

## 2018-05-07 DIAGNOSIS — T43595A Adverse effect of other antipsychotics and neuroleptics, initial encounter: Secondary | ICD-10-CM | POA: Diagnosis present

## 2018-05-07 DIAGNOSIS — R8281 Pyuria: Secondary | ICD-10-CM | POA: Diagnosis present

## 2018-05-07 DIAGNOSIS — Z7982 Long term (current) use of aspirin: Secondary | ICD-10-CM

## 2018-05-07 DIAGNOSIS — T433X5A Adverse effect of phenothiazine antipsychotics and neuroleptics, initial encounter: Secondary | ICD-10-CM | POA: Diagnosis present

## 2018-05-07 DIAGNOSIS — Z881 Allergy status to other antibiotic agents status: Secondary | ICD-10-CM

## 2018-05-07 DIAGNOSIS — F79 Unspecified intellectual disabilities: Secondary | ICD-10-CM | POA: Diagnosis present

## 2018-05-07 DIAGNOSIS — E669 Obesity, unspecified: Secondary | ICD-10-CM | POA: Diagnosis present

## 2018-05-07 DIAGNOSIS — J9600 Acute respiratory failure, unspecified whether with hypoxia or hypercapnia: Secondary | ICD-10-CM | POA: Diagnosis present

## 2018-05-07 DIAGNOSIS — Z79899 Other long term (current) drug therapy: Secondary | ICD-10-CM

## 2018-05-07 DIAGNOSIS — E039 Hypothyroidism, unspecified: Secondary | ICD-10-CM | POA: Diagnosis present

## 2018-05-07 DIAGNOSIS — Z87442 Personal history of urinary calculi: Secondary | ICD-10-CM

## 2018-05-07 DIAGNOSIS — J690 Pneumonitis due to inhalation of food and vomit: Principal | ICD-10-CM | POA: Diagnosis present

## 2018-05-07 DIAGNOSIS — G9341 Metabolic encephalopathy: Secondary | ICD-10-CM | POA: Diagnosis present

## 2018-05-07 DIAGNOSIS — E875 Hyperkalemia: Secondary | ICD-10-CM | POA: Diagnosis present

## 2018-05-07 DIAGNOSIS — R1110 Vomiting, unspecified: Secondary | ICD-10-CM

## 2018-05-07 DIAGNOSIS — Z6841 Body Mass Index (BMI) 40.0 and over, adult: Secondary | ICD-10-CM

## 2018-05-07 DIAGNOSIS — N390 Urinary tract infection, site not specified: Secondary | ICD-10-CM

## 2018-05-07 DIAGNOSIS — Z87440 Personal history of urinary (tract) infections: Secondary | ICD-10-CM

## 2018-05-07 DIAGNOSIS — N183 Chronic kidney disease, stage 3 unspecified: Secondary | ICD-10-CM | POA: Diagnosis present

## 2018-05-07 DIAGNOSIS — G809 Cerebral palsy, unspecified: Secondary | ICD-10-CM | POA: Diagnosis present

## 2018-05-07 DIAGNOSIS — T424X5A Adverse effect of benzodiazepines, initial encounter: Secondary | ICD-10-CM | POA: Diagnosis present

## 2018-05-07 DIAGNOSIS — Z880 Allergy status to penicillin: Secondary | ICD-10-CM

## 2018-05-07 DIAGNOSIS — I10 Essential (primary) hypertension: Secondary | ICD-10-CM | POA: Diagnosis present

## 2018-05-07 DIAGNOSIS — N179 Acute kidney failure, unspecified: Secondary | ICD-10-CM

## 2018-05-07 DIAGNOSIS — J9601 Acute respiratory failure with hypoxia: Secondary | ICD-10-CM | POA: Diagnosis not present

## 2018-05-07 DIAGNOSIS — R6250 Unspecified lack of expected normal physiological development in childhood: Secondary | ICD-10-CM | POA: Diagnosis present

## 2018-05-07 DIAGNOSIS — J189 Pneumonia, unspecified organism: Secondary | ICD-10-CM | POA: Diagnosis present

## 2018-05-07 DIAGNOSIS — I1310 Hypertensive heart and chronic kidney disease without heart failure, with stage 1 through stage 4 chronic kidney disease, or unspecified chronic kidney disease: Secondary | ICD-10-CM | POA: Diagnosis present

## 2018-05-07 DIAGNOSIS — F418 Other specified anxiety disorders: Secondary | ICD-10-CM | POA: Diagnosis not present

## 2018-05-07 DIAGNOSIS — Z79890 Hormone replacement therapy (postmenopausal): Secondary | ICD-10-CM

## 2018-05-07 DIAGNOSIS — E860 Dehydration: Secondary | ICD-10-CM | POA: Diagnosis present

## 2018-05-07 DIAGNOSIS — J9811 Atelectasis: Secondary | ICD-10-CM | POA: Diagnosis present

## 2018-05-07 DIAGNOSIS — E1122 Type 2 diabetes mellitus with diabetic chronic kidney disease: Secondary | ICD-10-CM | POA: Diagnosis present

## 2018-05-07 DIAGNOSIS — K219 Gastro-esophageal reflux disease without esophagitis: Secondary | ICD-10-CM | POA: Diagnosis present

## 2018-05-07 DIAGNOSIS — G808 Other cerebral palsy: Secondary | ICD-10-CM | POA: Diagnosis not present

## 2018-05-07 DIAGNOSIS — R1310 Dysphagia, unspecified: Secondary | ICD-10-CM | POA: Diagnosis present

## 2018-05-07 LAB — COMPREHENSIVE METABOLIC PANEL
ALT: 14 U/L (ref 0–44)
AST: 16 U/L (ref 15–41)
Albumin: 3.5 g/dL (ref 3.5–5.0)
Alkaline Phosphatase: 79 U/L (ref 38–126)
Anion gap: 8 (ref 5–15)
BUN: 29 mg/dL — ABNORMAL HIGH (ref 8–23)
CO2: 24 mmol/L (ref 22–32)
Calcium: 8.8 mg/dL — ABNORMAL LOW (ref 8.9–10.3)
Chloride: 110 mmol/L (ref 98–111)
Creatinine, Ser: 2.02 mg/dL — ABNORMAL HIGH (ref 0.44–1.00)
GFR calc Af Amer: 29 mL/min — ABNORMAL LOW (ref >60)
GFR calc non Af Amer: 25 mL/min — ABNORMAL LOW (ref >60)
Glucose, Bld: 151 mg/dL — ABNORMAL HIGH (ref 70–99)
Potassium: 5.3 mmol/L — ABNORMAL HIGH (ref 3.5–5.1)
Sodium: 142 mmol/L (ref 135–145)
Total Bilirubin: 1 mg/dL (ref 0.3–1.2)
Total Protein: 6.5 g/dL (ref 6.5–8.1)

## 2018-05-07 LAB — CBC WITH DIFFERENTIAL/PLATELET
Abs Immature Granulocytes: 0.03 10*3/uL (ref 0.00–0.07)
Basophils Absolute: 0.1 10*3/uL (ref 0.0–0.1)
Basophils Relative: 1 %
Eosinophils Absolute: 0 10*3/uL (ref 0.0–0.5)
Eosinophils Relative: 1 %
HCT: 37.7 % (ref 36.0–46.0)
Hemoglobin: 11.6 g/dL — ABNORMAL LOW (ref 12.0–15.0)
Immature Granulocytes: 1 %
Lymphocytes Relative: 15 %
Lymphs Abs: 0.9 10*3/uL (ref 0.7–4.0)
MCH: 30.4 pg (ref 26.0–34.0)
MCHC: 30.8 g/dL (ref 30.0–36.0)
MCV: 99 fL (ref 80.0–100.0)
Monocytes Absolute: 0.3 10*3/uL (ref 0.1–1.0)
Monocytes Relative: 6 %
Neutro Abs: 4.3 10*3/uL (ref 1.7–7.7)
Neutrophils Relative %: 76 %
Platelets: 252 10*3/uL (ref 150–400)
RBC: 3.81 MIL/uL — ABNORMAL LOW (ref 3.87–5.11)
RDW: 14.6 % (ref 11.5–15.5)
WBC: 5.7 10*3/uL (ref 4.0–10.5)
nRBC: 0 % (ref 0.0–0.2)

## 2018-05-07 LAB — URINALYSIS, ROUTINE W REFLEX MICROSCOPIC
Glucose, UA: NEGATIVE mg/dL
Ketones, ur: 5 mg/dL — AB
Nitrite: NEGATIVE
Protein, ur: 30 mg/dL — AB
RBC / HPF: >50 RBC/hpf — ABNORMAL HIGH (ref 0–5)
Specific Gravity, Urine: 1.023 (ref 1.005–1.030)
pH: 5 (ref 5.0–8.0)

## 2018-05-07 MED ORDER — FUROSEMIDE 40 MG PO TABS: 40.0000 mg | ORAL_TABLET | Freq: Every day | ORAL | Status: DC

## 2018-05-07 MED ORDER — ACETAMINOPHEN 500 MG PO TABS
500.0000 mg | ORAL_TABLET | Freq: Four times a day (QID) | ORAL | Status: DC | PRN
  Administered 2018-05-09: 1000 mg via ORAL
  Filled 2018-05-07: qty 2

## 2018-05-07 MED ORDER — HEPARIN SODIUM (PORCINE) 5000 UNIT/ML IJ SOLN
5000.0000 [IU] | Freq: Three times a day (TID) | INTRAMUSCULAR | Status: DC
  Administered 2018-05-07 – 2018-05-11 (×10): 5000 [IU] via SUBCUTANEOUS
  Filled 2018-05-07 (×11): qty 1

## 2018-05-07 MED ORDER — ALPRAZOLAM 0.5 MG PO TABS
0.5000 mg | ORAL_TABLET | Freq: Every day | ORAL | Status: DC | PRN
  Administered 2018-05-09 – 2018-05-11 (×2): 0.5 mg via ORAL
  Filled 2018-05-07 (×2): qty 1

## 2018-05-07 MED ORDER — ASPIRIN EC 81 MG PO TBEC
81.0000 mg | DELAYED_RELEASE_TABLET | Freq: Every morning | ORAL | Status: DC
  Administered 2018-05-08 – 2018-05-11 (×4): 81 mg via ORAL
  Filled 2018-05-07 (×4): qty 1

## 2018-05-07 MED ORDER — ESCITALOPRAM OXALATE 10 MG PO TABS
20.0000 mg | ORAL_TABLET | Freq: Every day | ORAL | Status: DC
  Administered 2018-05-07 – 2018-05-10 (×4): 20 mg via ORAL
  Filled 2018-05-07 (×4): qty 2

## 2018-05-07 MED ORDER — NEPRO/CARBSTEADY PO LIQD
237.0000 mL | Freq: Three times a day (TID) | ORAL | Status: DC | PRN
  Administered 2018-05-11: 237 mL via ORAL

## 2018-05-07 MED ORDER — FLUCONAZOLE 150 MG PO TABS: 150.0000 mg | ORAL_TABLET | ORAL | Status: DC

## 2018-05-07 MED ORDER — ALBUTEROL SULFATE (2.5 MG/3ML) 0.083% IN NEBU: 2.5000 mg | INHALATION_SOLUTION | RESPIRATORY_TRACT | Status: DC | PRN

## 2018-05-07 MED ORDER — LEVOTHYROXINE SODIUM 50 MCG PO TABS
50.0000 ug | ORAL_TABLET | Freq: Every day | ORAL | Status: DC
  Administered 2018-05-08 – 2018-05-11 (×4): 50 ug via ORAL
  Filled 2018-05-07 (×4): qty 1

## 2018-05-07 MED ORDER — CHLORPROMAZINE HCL 10 MG PO TABS
10.0000 mg | ORAL_TABLET | Freq: Three times a day (TID) | ORAL | Status: DC
  Administered 2018-05-08 – 2018-05-10 (×8): 10 mg via ORAL
  Filled 2018-05-07 (×17): qty 1

## 2018-05-07 MED ORDER — FUROSEMIDE 40 MG PO TABS: 40.0000 mg | ORAL_TABLET | Freq: Once | ORAL | Status: DC

## 2018-05-07 MED ORDER — SODIUM CHLORIDE 0.9 % IV BOLUS: 500.0000 mL | Freq: Once | INTRAVENOUS | Status: DC

## 2018-05-07 MED ORDER — SENNOSIDES-DOCUSATE SODIUM 8.6-50 MG PO TABS
1.0000 | ORAL_TABLET | Freq: Two times a day (BID) | ORAL | Status: DC
  Administered 2018-05-07 – 2018-05-11 (×5): 1 via ORAL
  Filled 2018-05-07 (×5): qty 1

## 2018-05-07 MED ORDER — FUROSEMIDE 10 MG/ML IJ SOLN
40.0000 mg | Freq: Once | INTRAMUSCULAR | Status: AC
  Administered 2018-05-07: 19:00:00 40 mg via INTRAVENOUS
  Filled 2018-05-07: qty 4

## 2018-05-07 MED ORDER — SODIUM BICARBONATE 650 MG PO TABS
650.0000 mg | ORAL_TABLET | Freq: Two times a day (BID) | ORAL | Status: DC
  Administered 2018-05-07 – 2018-05-11 (×8): 650 mg via ORAL
  Filled 2018-05-07 (×8): qty 1

## 2018-05-07 MED ORDER — BUSPIRONE HCL 5 MG PO TABS
10.0000 mg | ORAL_TABLET | Freq: Two times a day (BID) | ORAL | Status: DC
  Administered 2018-05-07: 10 mg via ORAL
  Filled 2018-05-07: qty 2

## 2018-05-07 MED ORDER — OXYBUTYNIN CHLORIDE ER 5 MG PO TB24
10.0000 mg | ORAL_TABLET | Freq: Every morning | ORAL | Status: DC
  Administered 2018-05-08 – 2018-05-11 (×4): 10 mg via ORAL
  Filled 2018-05-07 (×4): qty 2

## 2018-05-07 MED ORDER — OLANZAPINE 5 MG PO TABS: 10.0000 mg | ORAL_TABLET | Freq: Every day | ORAL | Status: DC

## 2018-05-07 MED ORDER — PANTOPRAZOLE SODIUM 40 MG PO TBEC
40.0000 mg | DELAYED_RELEASE_TABLET | Freq: Every day | ORAL | Status: DC
  Administered 2018-05-08 – 2018-05-11 (×4): 40 mg via ORAL
  Filled 2018-05-07 (×4): qty 1

## 2018-05-07 NOTE — H&P (Signed)
TRH H&P   Patient Demographics:    Rachel Morgan, is a 68 y.o. female  MRN: [id number]   DOB - 07-18-1950  Admit Date - 05/07/2018  Outpatient Primary MD for the patient is [facility]  Referring MD/NP/PA: Dr Alvino Chapel  Patient coming from: Home  Chief Complaint  Patient presents with  . Weakness      HPI:    Rachel Morgan  is a 68 y.o. 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, acquired hypothyroidism, hypertension, stage III chronic kidney disease with baseline creatinine of 1.8, sent Port-A-Cath given she is difficult blood draw, recent prolonged hospitalization secondary to AK I on CKD stage III, and UTI, patient discharged home to her sister care, she presents back to ED with complaints of weakness and shortness of breath, patient with cerebral palsy, cannot provide any history, it was obtained with her sister, apparently patient has with increased work of breathing on exertion recently, was more tachypneic, since her  recent hospitalization with her Aldactone has been stopped ,well per sister patient was less communicative recently(she is minimally verbal at baseline) more lethargic, and less interactive, requiring more assistance on ambulation, where she brought her to ED, patient herself cannot provide any review of system or complaints. - in ED creatinine elevated at 2, which is around her baseline, she was afebrile, with no leukocytosis, UA had some pyuria, but it was not a clean sample with some squamous cell, chest x-ray significant for atelectasis, she was noted to have toxemia 87-88 on room air while laying supine intermittently, I was called to admit for further evaluation.   Review of systems:    Not obtain any appropriate review of system from the patient given her cerebral  palsy  With Past History of the following :    Past Medical History:  Diagnosis Date  . Anxiety   . Calculus of gallbladder   . Cerebral palsy ([place])   . Chronic kidney disease   . Diabetes mellitus   . Gall stones   . GERD (gastroesophageal reflux disease)   . High cholesterol   . History of kidney stones   . Hypertension   . Hypothyroidism   . Kidney stones   . MR (mental retardation)   . Pyelonephritis   . Sepsis [facility])       Past Surgical History:  Procedure Laterality Date  . CYSTOSCOPY W/ URETERAL STENT PLACEMENT Bilateral 09/26/2015   Procedure: CYSTOSCOPY WITH Bilateral  RETROGRADE PYELOGRAM/URETERAL STENT PLACEMENT;  Surgeon: [name], MD;  Location: [facility];  Service: Urology;  Laterality: Bilateral;  . CYSTOSCOPY W/ URETERAL STENT PLACEMENT Right 04/22/2018   Procedure: CYSTOSCOPY WITH RETROGRADE PYELOGRAM/URETERAL STENT PLACEMENT;  Surgeon: Cleon Gustin, MD;  Location: [facility];  Service: Urology;  Laterality: Right;  . CYSTOSCOPY W/ URETERAL STENT REMOVAL Left 11/10/2015  Procedure: CYSTOSCOPY WITH STENT REMOVAL;  Surgeon: Cleon Gustin, MD;  Location: [facility];  Service: Urology;  Laterality: Left;  . CYSTOSCOPY WITH STENT PLACEMENT Right 01/21/2016   Procedure: CYSTOSCOPY WITH STENT PLACEMENT;  Surgeon: Franchot Gallo, MD;  Location: [facility];  Service: Urology;  Laterality: Right;  . CYSTOSCOPY/RETROGRADE/URETEROSCOPY/STONE EXTRACTION WITH BASKET Left 10/31/2015   Procedure: CYSTOSCOPY/ BILATERAL URETEROSCOPY/BILATERAL STONE EXTRACTION/ LEFT STENT PLACEMENT;  Surgeon: Irine Seal, MD;  Location: [facility];  Service: Urology;  Laterality: Left;  . CYSTOSCOPY/URETEROSCOPY/HOLMIUM LASER/STENT PLACEMENT Right 02/27/2016   Procedure: CYSTOSCOPY/RIGHT URETEROSCOPY/HOLMIUM LASER/STENT PLACEMENT/STENT REMOVAL;  Surgeon: Irine Seal, MD;  Location: [facility];  Service: Urology;  Laterality: Right;  . HOLMIUM LASER APPLICATION N/A 41/93/7902   Procedure: HOLMIUM LASER  APPLICATION;  Surgeon: Irine Seal, MD;  Location: [facility];  Service: Urology;  Laterality: N/A;  . IR CV LINE INJECTION  05/06/2018  . IR FLUORO GUIDE PORT INSERTION RIGHT  04/10/2016  . IR US GUIDE VASC ACCESS RIGHT  04/10/2016      Social History:     Social History   Tobacco Use  . Smoking status: Never Smoker  . Smokeless tobacco: Never Used  Substance Use Topics  . Alcohol use: No     Lives -home with her sister  Mobility -with walkers, and assistance     Family History :     Family History  Family history unknown: Yes   Unable to obtain any family history from the patient given he has cerebral palsy   Home Medications:   Prior to Admission medications   Medication Sig Start Date End Date Taking? Authorizing Provider  acetaminophen (TYLENOL) 500 MG tablet Take 500-1,000 mg by mouth every 6 (six) hours as needed (for pain.).   Yes [provider]  ALPRAZolam Duanne Moron) 0.5 MG tablet Take 1 tablet by mouth daily as needed for anxiety.  04/09/18  Yes [provider]  aspirin EC 81 MG tablet Take 81 mg by mouth every morning.    Yes [provider]  busPIRone (BUSPAR) 10 MG tablet Take 10 mg by mouth 2 (two) times daily.    Yes [provider]  chlorproMAZINE (THORAZINE) 25 MG tablet Take 25 mg by mouth 2 (two) times daily. *May take 25mg  three times daily as prescribed*   Yes [provider]  escitalopram (LEXAPRO) 20 MG tablet Take 20 mg by mouth at bedtime.   Yes [provider]  levofloxacin (LEVAQUIN) 500 MG tablet Take 500 mg by mouth daily. Take 1 tablet daily for 3 days   Yes [provider]  levothyroxine (SYNTHROID, LEVOTHROID) 50 MCG tablet Take 50 mcg by mouth daily before breakfast.   Yes [provider]  lidocaine (XYLOCAINE) 5 % ointment Apply 1 application topically See admin instructions. Apply one to two grams to the affected area(s) two to four times daily   Yes [provider]   OLANZapine (ZYPREXA) 20 MG tablet Take 20 mg by mouth daily.  07/24/15  Yes [provider]  omeprazole (PRILOSEC) 20 MG capsule Take 20 mg by mouth daily before breakfast.    Yes [provider]  oxybutynin (DITROPAN-XL) 10 MG 24 hr tablet Take 10 mg by mouth every morning.    Yes [provider]  povidone-iodine (BETADINE) 10 % ointment Apply 1 application topically See admin instructions. Use as directed   Yes [provider]  sodium bicarbonate 650 MG tablet Take 1 tablet (650 mg total) by mouth 2 (two) times daily.  04/25/18  Yes Kathie Dike, MD  zolpidem (AMBIEN) 10 MG tablet Take 10 mg by mouth at bedtime.   Yes [provider]  fluconazole (DIFLUCAN) 150 MG tablet Take 150 mg by mouth See admin instructions. Prescribed on 04/15/2018-take one tablet by mouth once; repeat in 72 hours if still symptomatic    [provider]  Nutritional Supplements (FEEDING SUPPLEMENT, NEPRO CARB STEADY,) LIQD Take 237 mLs by mouth 3 (three) times daily as needed (Supplement). 01/26/16   Sheikh, Omair Latif, DO  senna-docusate (SENOKOT-S) 8.6-50 MG tablet Take 1 tablet by mouth 2 (two) times daily. Patient taking differently: Take 1 tablet by mouth 2 (two) times daily as needed.  01/26/16   Kerney Elbe, DO     Allergies:     Allergies  Allergen Reactions  . Macrobid [Nitrofurantoin Monohyd Macro] Hives and Swelling  . Penicillins Swelling and Rash    Has patient had a PCN reaction causing immediate rash, facial/tongue/throat swelling, SOB or lightheadedness with hypotension: Yes Has patient had a PCN reaction causing severe rash involving mucus membranes or skin necrosis: Yes Has patient had a PCN reaction that required hospitalization: no Has patient had a PCN reaction occurring within the last 10 years: no If all of the above answers are "NO", then may proceed with Cephalosporin use. Patient tolerated rocephin and ertapenem in the past      Physical Exam:   Vitals  Blood pressure (!) 106/58, pulse 68, temperature 97.6 F (36.4 C), temperature source Oral, resp. rate (!) 21, height 4\' 11"  (1.499 m), weight 130 kg, SpO2 95 %.   1. General obese elderly female, laying in bed in no apparent distress  2.  He is awake, flat affect, diminished insight, minimally verbal, cerebral palsy, does not answer questions appropriately .  3. No F.N deficits, ALL C.Nerves Intact, Strength 5/5 all 4 extremities, Sensation intact all 4 extremities, Plantars down going.  4. Ears and Eyes appear Normal, Conjunctivae clear, PERRLA. Moist Oral Mucosa.  5. Supple Neck, No JVD, No cervical lymphadenopathy appriciated, No Carotid Bruits.  6. Symmetrical Chest wall movement, Good air movement bilaterally, bibasilar Rales.  7. RRR, No Gallops, Rubs or Murmurs, No Parasternal Heave.  Mild ankle pitting edema  8. Positive Bowel Sounds, Abdomen Soft, No tenderness, No organomegaly appriciated,No rebound -guarding or rigidity.  9.  No Cyanosis, Normal Skin Turgor, No Skin Rash or Bruise.  10. Good muscle tone,  joints appear normal , no effusions, Normal ROM.  11. No Palpable Lymph Nodes in Neck or Axillae     Data Review:    CBC Recent Labs  Lab 05/07/18 1612  WBC 5.7  HGB 11.6*  HCT 37.7  PLT 252  MCV 99.0  MCH 30.4  MCHC 30.8  RDW 14.6  LYMPHSABS 0.9  MONOABS 0.3  EOSABS 0.0  BASOSABS 0.1   ------------------------------------------------------------------------------------------------------------------  Chemistries  Recent Labs  Lab 05/07/18 1612  NA 142  K 5.3*  CL 110  CO2 24  GLUCOSE 151*  BUN 29*  CREATININE 2.02*  CALCIUM 8.8*  AST 16  ALT 14  ALKPHOS 79  BILITOT 1.0   ------------------------------------------------------------------------------------------------------------------ estimated creatinine clearance is 33.7 mL/min (A) (by C-G formula based on SCr of 2.02 mg/dL  (H)). ------------------------------------------------------------------------------------------------------------------ No results for input(s): TSH, T4TOTAL, T3FREE, THYROIDAB in the last 72 hours.  Invalid input(s): FREET3  Coagulation profile No results for input(s): INR, PROTIME in the last 168 hours. ------------------------------------------------------------------------------------------------------------------- No results for input(s): DDIMER in the last 72 hours. -------------------------------------------------------------------------------------------------------------------  Cardiac Enzymes No results for input(s): CKMB, TROPONINI, MYOGLOBIN in the last 168 hours.  Invalid input(s): CK ------------------------------------------------------------------------------------------------------------------ No results found for: BNP   ---------------------------------------------------------------------------------------------------------------  Urinalysis    Component Value Date/Time   COLORURINE AMBER (A) 05/07/2018 1638   APPEARANCEUR HAZY (A) 05/07/2018 1638   LABSPEC 1.023 05/07/2018 1638   PHURINE 5.0 05/07/2018 1638   GLUCOSEU NEGATIVE 05/07/2018 1638   HGBUR LARGE (A) 05/07/2018 1638   BILIRUBINUR SMALL (A) 05/07/2018 1638   KETONESUR 5 (A) 05/07/2018 1638   PROTEINUR 30 (A) 05/07/2018 1638   UROBILINOGEN 1.0 02/11/2011 2035   NITRITE NEGATIVE 05/07/2018 1638   LEUKOCYTESUR SMALL (A) 05/07/2018 1638    ----------------------------------------------------------------------------------------------------------------   Imaging Results:    Ir Cv Line Injection  Result Date: 05/06/2018 INDICATION: 68 year old female with a right IJ port catheter which was placed by interventional radiology (Dr. Earleen Newport) on 04/10/2016. Portacatheter is not aspirating well in patient presents for catheter injection. EXAM: CENTRAL VENOUS CATHETER MEDICATIONS: None ANESTHESIA/SEDATION:  None FLUOROSCOPY TIME:  Fluoroscopy Time: 0 minutes 6 seconds (15 mGy). COMPLICATIONS: None immediate. PROCEDURE: Informed written consent was obtained from the patient after a thorough discussion of the procedural risks, benefits and alternatives. All questions were addressed. A timeout was performed prior to the initiation of the procedure. The port catheter was sterilely accessed. Initial fluoroscopic imaging demonstrates a well-positioned right IJ approach single-lumen power injectable catheter. The catheter tip overlies the superior cavoatrial junction. Catheter injection was then performed under digital subtraction angiography. The port reservoir is widely patent as is the catheter tubing. No evidence of catheter fracture, kinking or extravasation. Contrast material exits the catheter tip freely. No significant fibrin sheath is visualized. Following injection, the port catheter was again aspirated. This time, the catheter aspirates freely. There may have been a small clot at the tip of the catheter which was displaced during line injection. The catheter is now functioning well. IMPRESSION: 1. Well-positioned right IJ approach single-lumen power injectable port catheter without evidence of complication. 2. Following contrast injection, the port catheter aspirates and flushes easily. There may have been a small occlusive thrombus at the tip of the catheter which was displaced by the injection procedure. Electronically Signed   By: Jacqulynn Cadet M.D.   On: 05/06/2018 13:12   Dg Chest Portable 1 View  Result Date: 05/07/2018 CLINICAL DATA:  Altered status, unsteady gait, shortness of breath, diabetes mellitus, hypertension, cerebral palsy, GERD EXAM: PORTABLE CHEST 1 VIEW COMPARISON:  Portable exam 1621 hours compared to 04/29/2018 FINDINGS: RIGHT jugular Port-A-Cath with tip projecting over SVC. Enlargement of cardiac silhouette. Mediastinal contours and pulmonary vascularity normal. Question mild  atelectasis at RIGHT base. Remaining lungs grossly clear for technique. No definite pleural effusion or pneumothorax. IMPRESSION: Enlargement of cardiac silhouette. Question mild RIGHT basilar atelectasis. Electronically Signed   By: Lavonia Dana M.D.   On: 05/07/2018 16:35   EKG Pe    Assessment & Plan:    Active Problems:   Hypertension   Depression with anxiety   CKD (chronic kidney disease), stage III (HCC)   GERD (gastroesophageal reflux disease)   Cerebral palsy (HCC)   Encephalopathy   Hyperkalemia   Acute respiratory failure (HCC)   Acute hypoxic respiratory failure -Patient was brought by her sister secondary to dyspnea, patient was saturating in the 80s at rest intermittently, will start on 2 L nasal cannula, chest x-ray only significant for atelectasis and cardiomegaly, I will give 40 mg of IV Lasix, and start on Lasix 40 mg  p.o. daily, this is most likely elated to volume overload as her Aldactone has been stopped on recent discharge, I will obtain 2D echo, continue daily weight and strict ins and out.  Weakness -Sister report patient with progressive weakness, usually ambulates with walker with assistant, but has been more weak recently, I will consult PT, and I do think polypharmacy is contributing as well, please see discussion below under encephalopathy.  Encephalopathy -Patient with cerebral palsy at baseline, minimally communicative, only awake, oriented x1, but per her sister she has been less communicative, more lethargic, she is on multiple psychotic medications, I will decrease her Thorazine, Xanax and Zyprexa, and hold her Ambien  CKD stage III -Does appear to be at baseline 1.82, continue with sodium bicarbonate oral  Depression and anxiety -Resume home meds, please see discussion above regarding adjustment her antipsychotic medications  GERD -Continue with home PPI  Cerebral palsy -Continue with supportive care  Hyperkalemia -Will be given IV Lasix, will  repeat BMP in a.m.   DVT Prophylaxis Heparin -   SCDs  AM Labs Ordered, also please review Full Orders  Family Communication: Admission, patients condition and plan of care including tests being ordered have been discussed with the patient and daughter via phone who indicate understanding and agree with the plan and Code Status.  Code Status Full , confirmed by sister  Likely DC to , likely home with home health, PT consult pending  Condition GUARDED    Consults called: None  Admission status: Observation  Time spent in minutes : 60 minutes   Phillips Climes M.D on 05/07/2018 at 6:16 PM  Between 7am to 7pm - Pager - [phone]. After 7pm go to [email or web address] - password [facility]  [facility] - Office  [phone]

## 2018-05-07 NOTE — ED Triage Notes (Addendum)
Patient's family states that she has an unsteady gait and shortness of breath inconsistent with her baseline. Family believes she may have a unresolved UTI.

## 2018-05-07 NOTE — Progress Notes (Signed)
CSW at bedside to discuss MOON notice. CSW attempted to inform Pt of her observation status. Pt responded to each statement by saying " Yea, Yea". CSW followed up with Pt's nurse for orientation. CSW was informed by nurse that Pt has some MR. CSW will follow up with Pt's family to deliver MOON notice.

## 2018-05-07 NOTE — TOC Initial Note (Signed)
Transition of Care [facility]) - Initial/Assessment Note    Patient Details  Name: Rachel Morgan MRN: [id number] Date of Birth: 03-20-1950  [facility]) CM/SW Contact:    Kynsie Falkner Dimitri Ped, LCSW Phone Number: 05/07/2018, 7:11 PM  Clinical Narrative:   CSW visited Pt at bedside. Pt not fully oriented at this time and is unable to communicate due to MR diagnosis.   CSW contacted Pt's guardian to discuss discharge needs and MOON notice. Pt's sister Yicel Shannon reported that Pt lives at home with sister while receiving CAP services. Amedeo Plenty goes into detail and explains the hours in which patient has an aide in the home.   Pt uses a walker to ambulate and also used a bedside commode to assist with toileting. Pt is transported to medical appointments by EMS. Pt's PCP makes house-calls for her appointments. Pt's guardian also reports that Pt's next follow-up appointment is May 6th.   Amedeo Plenty also wanted to include that the bruising on the pt's body comes from a previous visit where there was difficulty placing an IV. Amedeo Plenty wanted this message to be relayed so that there are no suspicions.   CSW will continue to follow for D/C needs.  Springer LCSWA Transitions of Care  Clinical Social Worker  Ph: [phone]                    Expected Discharge Plan: Home/Self Care(Lives with Sister who provides care and supervision) Barriers to Discharge: No Barriers Identified   Patient Goals and CMS Choice Patient states their goals for this hospitalization and ongoing recovery are:: Pt is unable to communicate this goal at this time      Expected Discharge Plan and Services Expected Discharge Plan: Home/Self Care(Lives with Sister who provides care and supervision)       Living arrangements for the past 2 months: Single Family Home                                      Prior Living Arrangements/Services Living arrangements for the past 2 months: Single  Family Home Lives with:: Siblings   Do you feel safe going back to the place where you live?: Yes        Care giver support system in place?: Yes (comment)   Criminal Activity/Legal Involvement Pertinent to Current Situation/Hospitalization: No - Comment as needed  Activities of Daily Living      Permission Sought/Granted Permission sought to share information with : Facility Sport and exercise psychologist, Family Supports Permission granted to share information with : Yes, Verbal Permission Granted  Share Information with NAME: Ashaunti Treptow     Permission granted to share info w Relationship: Sister/Gaurdian  Permission granted to share info w Contact Information: [phone]  Emotional Assessment Appearance:: Appears stated age Attitude/Demeanor/Rapport: Unable to Assess Affect (typically observed): Calm Orientation: : Oriented to Self   Psych Involvement: No (comment)  Admission diagnosis:  UTI Patient Active Problem List   Diagnosis Date Noted  . Acute respiratory failure ([place]) 05/07/2018  . Acute metabolic encephalopathy 26/37/8588  . Hypomagnesemia 04/21/2018  . UTI (urinary tract infection) 04/20/2018  . PICC (peripherally inserted central catheter) in place   . Urinary tract infection without hematuria   . Hydronephrosis with renal and ureteral calculus obstruction 01/21/2016  . Hyperkalemia 01/21/2016  . Palliative care encounter   . DNR (do not resuscitate) discussion   .  Encephalopathy 10/17/2015  . Goals of care, counseling/discussion 10/17/2015  . Nephrolithiasis 09/27/2015  . Cerebral palsy ([place]) 09/27/2015  . Sepsis ([place]) 09/26/2015  . AKI (acute kidney injury) ([place]) 09/26/2015  . Fever   . Urinary tract infectious disease   . Dilated cbd, acquired 09/14/2015  . Hypothyroidism 09/14/2015  . Hypertension 09/14/2015  . Depression with anxiety 09/14/2015  . UPJ obstruction, acquired 09/14/2015  . CKD (chronic kidney disease), stage III ([place]) 09/14/2015  .  GERD (gastroesophageal reflux disease) 09/14/2015  . Calculus of gallbladder without cholecystitis without obstruction   . Abdominal pain, right upper quadrant 02/11/2011  . Acute pyelonephritis 02/11/2011  . Agoraphobia 02/11/2011  . Acute renal failure ([place]) 02/11/2011  . Hyponatremia 02/11/2011  . Dehydration 02/11/2011   PCP:  [facility]:   [place], [place] - [address] [place] [address] Phone: [phone] Fax: [phone]     Social Determinants of Health (SDOH) Interventions    Readmission Risk Interventions Readmission Risk Prevention Plan 04/24/2018  Transportation Screening Complete  PCP or Specialist Appt within 3-5 Days Not Complete  Social Work Consult for [place] Planning/Counseling [place] Not Applicable  Medication Review Press photographer) Complete  Some recent data might be hidden

## 2018-05-07 NOTE — Care Management Obs Status (Signed)
[place] NOTIFICATION   Patient Details  Name: Rachel Morgan MRN: [id number] Date of Birth: Mar 12, 1950   Medicare Observation Status Notification Given:  Other (see comment)(Notice delivered via telephone to Pt's sister/gaurdian by Boykin Peek, [place].  ph: [phone])    [place], LCSW 05/07/2018, 7:07 PM

## 2018-05-07 NOTE — ED Provider Notes (Signed)
[facility] EMERGENCY DEPARTMENT Provider Note   CSN: [id number] Arrival date & time: 05/07/18  1521    History   Chief Complaint Chief Complaint  Patient presents with  . Weakness    HPI Rachel Morgan is a 68 y.o. female.     HPI Level 5 caveat due to mental retardation. Patient reportedly brought in because more shortness of breath and unsteadiness.  Family is worried about an unresolved urinary tract infection.  However family is not here with patient and when to call the numbers there is no answer.  Really cannot get much history from the patient.  Was here around a week ago with worrisome for shortness of breath and perhaps blood clot.  At that time negative CTA of the chest and urine culture from 8 days ago showed no growth. Past Medical History:  Diagnosis Date  . Anxiety   . Calculus of gallbladder   . Cerebral palsy ([place])   . Chronic kidney disease   . Diabetes mellitus   . Gall stones   . GERD (gastroesophageal reflux disease)   . High cholesterol   . History of kidney stones   . Hypertension   . Hypothyroidism   . Kidney stones   . MR (mental retardation)   . Pyelonephritis   . Sepsis [facility])     Patient Active Problem List   Diagnosis Date Noted  . Acute metabolic encephalopathy 02/58/5277  . Hypomagnesemia 04/21/2018  . UTI (urinary tract infection) 04/20/2018  . PICC (peripherally inserted central catheter) in place   . Urinary tract infection without hematuria   . Hydronephrosis with renal and ureteral calculus obstruction 01/21/2016  . Hyperkalemia 01/21/2016  . Palliative care encounter   . DNR (do not resuscitate) discussion   . Encephalopathy 10/17/2015  . Goals of care, counseling/discussion 10/17/2015  . Nephrolithiasis 09/27/2015  . Cerebral palsy ([place]) 09/27/2015  . Sepsis ([place]) 09/26/2015  . AKI (acute kidney injury) ([place]) 09/26/2015  . Fever   . Urinary tract infectious disease   . Dilated cbd, acquired 09/14/2015  .  Hypothyroidism 09/14/2015  . Hypertension 09/14/2015  . Depression with anxiety 09/14/2015  . UPJ obstruction, acquired 09/14/2015  . CKD (chronic kidney disease), stage III ([place]) 09/14/2015  . GERD (gastroesophageal reflux disease) 09/14/2015  . Calculus of gallbladder without cholecystitis without obstruction   . Abdominal pain, right upper quadrant 02/11/2011  . Acute pyelonephritis 02/11/2011  . Agoraphobia 02/11/2011  . Acute renal failure ([place]) 02/11/2011  . Hyponatremia 02/11/2011  . Dehydration 02/11/2011    Past Surgical History:  Procedure Laterality Date  . CYSTOSCOPY W/ URETERAL STENT PLACEMENT Bilateral 09/26/2015   Procedure: CYSTOSCOPY WITH Bilateral  RETROGRADE PYELOGRAM/URETERAL STENT PLACEMENT;  Surgeon: [name], MD;  Location: [facility];  Service: Urology;  Laterality: Bilateral;  . CYSTOSCOPY W/ URETERAL STENT PLACEMENT Right 04/22/2018   Procedure: CYSTOSCOPY WITH RETROGRADE PYELOGRAM/URETERAL STENT PLACEMENT;  Surgeon: Cleon Gustin, MD;  Location: [facility];  Service: Urology;  Laterality: Right;  . CYSTOSCOPY W/ URETERAL STENT REMOVAL Left 11/10/2015   Procedure: CYSTOSCOPY WITH STENT REMOVAL;  Surgeon: Cleon Gustin, MD;  Location: [facility];  Service: Urology;  Laterality: Left;  . CYSTOSCOPY WITH STENT PLACEMENT Right 01/21/2016   Procedure: CYSTOSCOPY WITH STENT PLACEMENT;  Surgeon: Franchot Gallo, MD;  [facility];  Service: Urology;  Laterality: Right;  . CYSTOSCOPY/RETROGRADE/URETEROSCOPY/STONE EXTRACTION WITH BASKET Left 10/31/2015   Procedure: CYSTOSCOPY/ BILATERAL URETEROSCOPY/BILATERAL STONE EXTRACTION/ LEFT STENT PLACEMENT;  Surgeon: Irine Seal, MD;  Location:  [facility];  Service: Urology;  Laterality: Left;  . CYSTOSCOPY/URETEROSCOPY/HOLMIUM LASER/STENT PLACEMENT Right 02/27/2016   Procedure: CYSTOSCOPY/RIGHT URETEROSCOPY/HOLMIUM LASER/STENT PLACEMENT/STENT REMOVAL;  Surgeon: Irine Seal, MD;  [facility];  Service: Urology;  Laterality:  Right;  . HOLMIUM LASER APPLICATION N/A 09/40/7680   Procedure: HOLMIUM LASER APPLICATION;  Surgeon: Irine Seal, MD;  [facility];  Service: Urology;  Laterality: N/A;  . IR CV LINE INJECTION  05/06/2018  . IR FLUORO GUIDE PORT INSERTION RIGHT  04/10/2016  . IR US GUIDE VASC ACCESS RIGHT  04/10/2016     OB History   No obstetric history on file.      Home Medications    Prior to Admission medications   Medication Sig Start Date End Date Taking? Authorizing Provider  acetaminophen (TYLENOL) 500 MG tablet Take 500-1,000 mg by mouth every 6 (six) hours as needed (for pain.).   Yes [provider]  ALPRAZolam Duanne Moron) 0.5 MG tablet Take 1 tablet by mouth daily as needed for anxiety.  04/09/18  Yes [provider]  aspirin EC 81 MG tablet Take 81 mg by mouth every morning.    Yes [provider]  busPIRone (BUSPAR) 10 MG tablet Take 10 mg by mouth 2 (two) times daily.    Yes [provider]  chlorproMAZINE (THORAZINE) 25 MG tablet Take 25 mg by mouth 2 (two) times daily. *May take 25mg  three times daily as prescribed*   Yes [provider]  escitalopram (LEXAPRO) 20 MG tablet Take 20 mg by mouth at bedtime.   Yes [provider]  levofloxacin (LEVAQUIN) 500 MG tablet Take 500 mg by mouth daily. Take 1 tablet daily for 3 days   Yes [provider]  levothyroxine (SYNTHROID, LEVOTHROID) 50 MCG tablet Take 50 mcg by mouth daily before breakfast.   Yes [provider]  lidocaine (XYLOCAINE) 5 % ointment Apply 1 application topically See admin instructions. Apply one to two grams to the affected area(s) two to four times daily   Yes [provider]  OLANZapine (ZYPREXA) 20 MG tablet Take 20 mg by mouth daily.  07/24/15  Yes [provider]  omeprazole (PRILOSEC) 20 MG capsule Take 20 mg by mouth daily before breakfast.    Yes [provider]  oxybutynin (DITROPAN-XL) 10 MG 24 hr tablet Take 10 mg by  mouth every morning.    Yes [provider]  povidone-iodine (BETADINE) 10 % ointment Apply 1 application topically See admin instructions. Use as directed   Yes [provider]  sodium bicarbonate 650 MG tablet Take 1 tablet (650 mg total) by mouth 2 (two) times daily. 04/25/18  Yes Kathie Dike, MD  zolpidem (AMBIEN) 10 MG tablet Take 10 mg by mouth at bedtime.   Yes [provider]  fluconazole (DIFLUCAN) 150 MG tablet Take 150 mg by mouth See admin instructions. Prescribed on 04/15/2018-take one tablet by mouth once; repeat in 72 hours if still symptomatic    [provider]  Nutritional Supplements (FEEDING SUPPLEMENT, NEPRO CARB STEADY,) LIQD Take 237 mLs by mouth 3 (three) times daily as needed (Supplement). 01/26/16   Sheikh, Omair Latif, DO  senna-docusate (SENOKOT-S) 8.6-50 MG tablet Take 1 tablet by mouth 2 (two) times daily. Patient taking differently: Take 1 tablet by mouth 2 (two) times daily as needed.  01/26/16   Kerney Elbe, DO    Family History Family History  Family history unknown: Yes    Social History Social History   Tobacco  Use  . Smoking status: Never Smoker  . Smokeless tobacco: Never Used  Substance Use Topics  . Alcohol use: No  . Drug use: No     Allergies   Macrobid [nitrofurantoin monohyd macro] and Penicillins   Review of Systems Review of Systems  Unable to perform ROS: Dementia     Physical Exam Updated Vital Signs BP (!) 133/99   Pulse 72   Temp 97.6 F (36.4 C) (Oral)   Resp (!) 21   Ht 4\' 11"  (1.499 m)   Wt 130 kg   SpO2 95%   BMI 57.89 kg/m   Physical Exam Vitals signs and nursing note reviewed.  HENT:     Head: Normocephalic.     Mouth/Throat:     Comments: Patient is breathing with her mouth open tongue is somewhat dry Eyes:     Comments: Disconjugate gaze  Cardiovascular:     Rate and Rhythm: Normal rate.  Pulmonary:     Breath sounds: No wheezing or rhonchi.  Abdominal:      Tenderness: There is no abdominal tenderness.  Musculoskeletal:     Right lower leg: Edema present.     Left lower leg: Edema present.  Skin:    General: Skin is warm.  Neurological:     Comments: Awake.  Able answer some questions but unreliable answers.  Appears to be moving bilateral extremities.      ED Treatments / Results  Labs (all labs ordered are listed, but only abnormal results are displayed) Labs Reviewed  COMPREHENSIVE METABOLIC PANEL - Abnormal; Notable for the following components:      Result Value   Potassium 5.3 (*)    Glucose, Bld 151 (*)    BUN 29 (*)    Creatinine, Ser 2.02 (*)    Calcium 8.8 (*)    GFR calc non Af Amer 25 (*)    GFR calc Af Amer 29 (*)    All other components within normal limits  URINALYSIS, ROUTINE W REFLEX MICROSCOPIC - Abnormal; Notable for the following components:   Color, Urine AMBER (*)    APPearance HAZY (*)    Hgb urine dipstick LARGE (*)    Bilirubin Urine SMALL (*)    Ketones, ur 5 (*)    Protein, ur 30 (*)    Leukocytes,Ua SMALL (*)    RBC / HPF >50 (*)    Bacteria, UA RARE (*)    All other components within normal limits  CBC WITH DIFFERENTIAL/PLATELET - Abnormal; Notable for the following components:   RBC 3.81 (*)    Hemoglobin 11.6 (*)    All other components within normal limits    EKG None  Radiology Ir Cv Line Injection  Result Date: 05/06/2018 INDICATION: 68 year old female with a right IJ port catheter which was placed by interventional radiology (Dr. Earleen Newport) on 04/10/2016. Portacatheter is not aspirating well in patient presents for catheter injection. EXAM: CENTRAL VENOUS CATHETER MEDICATIONS: None ANESTHESIA/SEDATION: None FLUOROSCOPY TIME:  Fluoroscopy Time: 0 minutes 6 seconds (15 mGy). COMPLICATIONS: None immediate. PROCEDURE: Informed written consent was obtained from the patient after a thorough discussion of the procedural risks, benefits and alternatives. All questions were addressed. A timeout  was performed prior to the initiation of the procedure. The port catheter was sterilely accessed. Initial fluoroscopic imaging demonstrates a well-positioned right IJ approach single-lumen power injectable catheter. The catheter tip overlies the superior cavoatrial junction. Catheter injection was then performed under digital subtraction angiography. The port reservoir is widely patent  as is the catheter tubing. No evidence of catheter fracture, kinking or extravasation. Contrast material exits the catheter tip freely. No significant fibrin sheath is visualized. Following injection, the port catheter was again aspirated. This time, the catheter aspirates freely. There may have been a small clot at the tip of the catheter which was displaced during line injection. The catheter is now functioning well. IMPRESSION: 1. Well-positioned right IJ approach single-lumen power injectable port catheter without evidence of complication. 2. Following contrast injection, the port catheter aspirates and flushes easily. There may have been a small occlusive thrombus at the tip of the catheter which was displaced by the injection procedure. Electronically Signed   By: Jacqulynn Cadet M.D.   On: 05/06/2018 13:12   Dg Chest Portable 1 View  Result Date: 05/07/2018 CLINICAL DATA:  Altered status, unsteady gait, shortness of breath, diabetes mellitus, hypertension, cerebral palsy, GERD EXAM: PORTABLE CHEST 1 VIEW COMPARISON:  Portable exam 1621 hours compared to 04/29/2018 FINDINGS: RIGHT jugular Port-A-Cath with tip projecting over SVC. Enlargement of cardiac silhouette. Mediastinal contours and pulmonary vascularity normal. Question mild atelectasis at RIGHT base. Remaining lungs grossly clear for technique. No definite pleural effusion or pneumothorax. IMPRESSION: Enlargement of cardiac silhouette. Question mild RIGHT basilar atelectasis. Electronically Signed   By: Lavonia Dana M.D.   On: 05/07/2018 16:35     Procedures Procedures (including critical care time)  Medications Ordered in ED Medications - No data to display   Initial Impression / Assessment and Plan / ED Course  I have reviewed the triage vital signs and the nursing notes.  Pertinent labs & imaging results that were available during my care of the patient were reviewed by me and considered in my medical decision making (see chart for details).        Patient brought in for mental status changes.  Reportedly more unsteady may be more short of breath.  Patient's mouth is very dry.  Report is that family thought she may have a urinary tract infection however I am not been able to get through to the patient's legal guardian.  Patient's urine does have white cells and rare bacteria.  Creatinine is increasing again.  Has been down to 1.7 which is is higher than her baseline of 1.4, however has been on the way up to 4.  It is however increasing again.  With a questionable urinary tract infection and likely dehydration with increasing kidney function and mental status changes feels patient benefit from hospital admission.  Will discuss with hospitalist  Final Clinical Impressions(s) / ED Diagnoses   Final diagnoses:  AKI (acute kidney injury) ([place])  Encephalopathy  Urinary tract infection without hematuria, site unspecified    ED Discharge Orders    None       Davonna Belling, MD 05/07/18 1727

## 2018-05-08 ENCOUNTER — Observation Stay (HOSPITAL_COMMUNITY): Payer: Medicare Other

## 2018-05-08 ENCOUNTER — Other Ambulatory Visit: Payer: Self-pay

## 2018-05-08 ENCOUNTER — Observation Stay (HOSPITAL_BASED_OUTPATIENT_CLINIC_OR_DEPARTMENT_OTHER): Payer: Medicare Other

## 2018-05-08 DIAGNOSIS — I361 Nonrheumatic tricuspid (valve) insufficiency: Secondary | ICD-10-CM | POA: Diagnosis not present

## 2018-05-08 DIAGNOSIS — G808 Other cerebral palsy: Secondary | ICD-10-CM

## 2018-05-08 DIAGNOSIS — N183 Chronic kidney disease, stage 3 (moderate): Secondary | ICD-10-CM | POA: Diagnosis not present

## 2018-05-08 DIAGNOSIS — F418 Other specified anxiety disorders: Secondary | ICD-10-CM

## 2018-05-08 DIAGNOSIS — J9601 Acute respiratory failure with hypoxia: Secondary | ICD-10-CM

## 2018-05-08 DIAGNOSIS — K219 Gastro-esophageal reflux disease without esophagitis: Secondary | ICD-10-CM

## 2018-05-08 DIAGNOSIS — G9340 Encephalopathy, unspecified: Secondary | ICD-10-CM

## 2018-05-08 DIAGNOSIS — I10 Essential (primary) hypertension: Secondary | ICD-10-CM

## 2018-05-08 DIAGNOSIS — E875 Hyperkalemia: Secondary | ICD-10-CM

## 2018-05-08 LAB — CBC
HCT: 40.3 % (ref 36.0–46.0)
Hemoglobin: 12.6 g/dL (ref 12.0–15.0)
MCH: 30.7 pg (ref 26.0–34.0)
MCHC: 31.3 g/dL (ref 30.0–36.0)
MCV: 98.3 fL (ref 80.0–100.0)
Platelets: 285 K/uL (ref 150–400)
RBC: 4.1 MIL/uL (ref 3.87–5.11)
RDW: 14.6 % (ref 11.5–15.5)
WBC: 19.8 K/uL — ABNORMAL HIGH (ref 4.0–10.5)
nRBC: 0 % (ref 0.0–0.2)

## 2018-05-08 LAB — BASIC METABOLIC PANEL WITH GFR
Anion gap: 12 (ref 5–15)
BUN: 39 mg/dL — ABNORMAL HIGH (ref 8–23)
CO2: 23 mmol/L (ref 22–32)
Calcium: 8.6 mg/dL — ABNORMAL LOW (ref 8.9–10.3)
Chloride: 108 mmol/L (ref 98–111)
Creatinine, Ser: 2.4 mg/dL — ABNORMAL HIGH (ref 0.44–1.00)
GFR calc Af Amer: 24 mL/min — ABNORMAL LOW
GFR calc non Af Amer: 20 mL/min — ABNORMAL LOW
Glucose, Bld: 171 mg/dL — ABNORMAL HIGH (ref 70–99)
Potassium: 5.2 mmol/L — ABNORMAL HIGH (ref 3.5–5.1)
Sodium: 143 mmol/L (ref 135–145)

## 2018-05-08 LAB — ECHOCARDIOGRAM COMPLETE
Height: 59 in
Weight: 4472.69 oz

## 2018-05-08 MED ORDER — OLANZAPINE 5 MG PO TABS
5.0000 mg | ORAL_TABLET | Freq: Every day | ORAL | Status: DC
  Administered 2018-05-08 – 2018-05-11 (×4): 5 mg via ORAL
  Filled 2018-05-08 (×4): qty 1

## 2018-05-08 MED ORDER — ONDANSETRON HCL 4 MG/2ML IJ SOLN
4.0000 mg | Freq: Four times a day (QID) | INTRAMUSCULAR | Status: DC | PRN
  Administered 2018-05-08: 4 mg via INTRAVENOUS
  Filled 2018-05-08: qty 2

## 2018-05-08 MED ORDER — SODIUM CHLORIDE 0.9 % IV SOLN
INTRAVENOUS | Status: DC
  Administered 2018-05-08 – 2018-05-10 (×3): via INTRAVENOUS

## 2018-05-08 MED ORDER — SODIUM ZIRCONIUM CYCLOSILICATE 10 G PO PACK
10.0000 g | PACK | Freq: Three times a day (TID) | ORAL | Status: AC
  Administered 2018-05-08 (×2): 10 g via ORAL
  Filled 2018-05-08 (×2): qty 1

## 2018-05-08 MED ORDER — BUSPIRONE HCL 5 MG PO TABS
5.0000 mg | ORAL_TABLET | Freq: Two times a day (BID) | ORAL | Status: DC
  Administered 2018-05-08 – 2018-05-11 (×7): 5 mg via ORAL
  Filled 2018-05-08 (×7): qty 1

## 2018-05-08 MED ORDER — SODIUM CHLORIDE 0.9 % IV SOLN
1.0000 g | Freq: Two times a day (BID) | INTRAVENOUS | Status: AC
  Administered 2018-05-08 – 2018-05-10 (×6): 1 g via INTRAVENOUS
  Filled 2018-05-08 (×6): qty 1

## 2018-05-08 NOTE — Plan of Care (Signed)
Spoke with patient's family, daughter just wanted to know how she was doing.

## 2018-05-08 NOTE — Evaluation (Signed)
Clinical/Bedside Swallow Evaluation Patient Details  Name: Rachel Morgan MRN: [id number] Date of Birth: Jun 16, 1950  Today's Date: 05/08/2018 Time: SLP Start Time (ACUTE ONLY): 1200 SLP Stop Time (ACUTE ONLY): 1225 SLP Time Calculation (min) (ACUTE ONLY): 25 min  Past Medical History:  Past Medical History:  Diagnosis Date  . Anxiety   . Calculus of gallbladder   . Cerebral palsy ([place])   . Chronic kidney disease   . Diabetes mellitus   . Gall stones   . GERD (gastroesophageal reflux disease)   . High cholesterol   . History of kidney stones   . Hypertension   . Hypothyroidism   . Kidney stones   . MR (mental retardation)   . Pyelonephritis   . Sepsis [facility])    Past Surgical History:  Past Surgical History:  Procedure Laterality Date  . CYSTOSCOPY W/ URETERAL STENT PLACEMENT Bilateral 09/26/2015   Procedure: CYSTOSCOPY WITH Bilateral  RETROGRADE PYELOGRAM/URETERAL STENT PLACEMENT;  Surgeon: [name], MD;  [facility];  Service: Urology;  Laterality: Bilateral;  . CYSTOSCOPY W/ URETERAL STENT PLACEMENT Right 04/22/2018   Procedure: CYSTOSCOPY WITH RETROGRADE PYELOGRAM/URETERAL STENT PLACEMENT;  Surgeon: Cleon Gustin, MD;  Location: [facility];  Service: Urology;  Laterality: Right;  . CYSTOSCOPY W/ URETERAL STENT REMOVAL Left 11/10/2015   Procedure: CYSTOSCOPY WITH STENT REMOVAL;  Surgeon: Cleon Gustin, MD;  Location: [facility];  Service: Urology;  Laterality: Left;  . CYSTOSCOPY WITH STENT PLACEMENT Right 01/21/2016   Procedure: CYSTOSCOPY WITH STENT PLACEMENT;  Surgeon: Franchot Gallo, MD;  [facility];  Service: Urology;  Laterality: Right;  . CYSTOSCOPY/RETROGRADE/URETEROSCOPY/STONE EXTRACTION WITH BASKET Left 10/31/2015   Procedure: CYSTOSCOPY/ BILATERAL URETEROSCOPY/BILATERAL STONE EXTRACTION/ LEFT STENT PLACEMENT;  Surgeon: Irine Seal, MD;  [facility];  Service: Urology;  Laterality: Left;  . CYSTOSCOPY/URETEROSCOPY/HOLMIUM LASER/STENT PLACEMENT  Right 02/27/2016   Procedure: CYSTOSCOPY/RIGHT URETEROSCOPY/HOLMIUM LASER/STENT PLACEMENT/STENT REMOVAL;  Surgeon: Irine Seal, MD;  [facility];  Service: Urology;  Laterality: Right;  . HOLMIUM LASER APPLICATION N/A 53/97/6734   Procedure: HOLMIUM LASER APPLICATION;  Surgeon: Irine Seal, MD;  [facility];  Service: Urology;  Laterality: N/A;  . IR CV LINE INJECTION  05/06/2018  . IR FLUORO GUIDE PORT INSERTION RIGHT  04/10/2016  . IR US GUIDE VASC ACCESS RIGHT  04/10/2016   HPI:  Rachel Morgan  is a 68 y.o. female, with medical history significant forcognitive developmental delay in the setting of cerebral palsy, acquired hypothyroidism, hypertension, stage III chronic kidney disease with baseline creatinine of 1.8, sent Port-A-Cath given she is difficult blood draw, recent prolonged hospitalization secondary to AK I on CKD stage III, and UTI, patient discharged home to her sister care, she presents back to ED with complaints of weakness and shortness of breath, patient with cerebral palsy, cannot provide any history, it was obtained with her sister, apparently patient has with increased work of breathing on exertion recently, was more tachypneic, since her  recent hospitalization with her Aldactone has been stopped ,well per sister patient was less communicative recently(she is minimally verbal at baseline) more lethargic, and less interactive, requiring more assistance on ambulation, where she brought her to ED, patient herself cannot provide any review of system or complaints. Chest x-ray shows: Lower lung volumes. New patchy and indistinct opacity in the left lung, suspicious for aspiration in this clinical setting. Right lung atelectasis. BSE requested.   Assessment / Plan / Recommendation Clinical Impression  Clinical swallow evaluation completed while Pt seated upright in chair  in room. Pt required mod/max cues to follow directions for oral motor evaluation due to cognitive deficits. Pt is  edentulous. She exhibits tongue thrust pattern which carries over into eating/drinking. Pt consumed straw sips thin liquids without overt signs or symptoms of aspiration. She did not close lips around straw, but instead uses tongue and upper lip for closure around the straw. Pt with inefficient and prolonged oral prep with mechanical soft textures with resulting oral residuals. Pt benefited from liquid wash to help clear oral residuals and also needed toothbrush to further clear. SLP spoke with Pt's sister, Rachel Morgan, who reports that Pt typically feeds herself at home, avoids meats, and enjoys soft cooked vegetables. Pt noted to belch during po intake with SLP and Rachel Morgan endorses the same at home. Pt reportedly had an episode of emesis/regurgitation over night, however no records found in chart and current chest x-ray is suspicious for aspiration. Pt may be at risk for post prandial aspiration given reports of belching and emesis. Recommend D2 and thin liquids with 100% supervision with meals and oral care before and after. SLP will follow during acute stay. Above d/w RN.    SLP Visit Diagnosis: Dysphagia, unspecified (R13.10)    Aspiration Risk  Mild aspiration risk    Diet Recommendation Dysphagia 2 (Fine chop);Thin liquid   Liquid Administration via: Cup;Straw Medication Administration: Whole meds with liquid Supervision: Staff to assist with self feeding;Full supervision/cueing for compensatory strategies Compensations: Slow rate;Small sips/bites;Lingual sweep for clearance of pocketing Postural Changes: Seated upright at 90 degrees;Remain upright for at least 30 minutes after po intake    Other  Recommendations Oral Care Recommendations: Oral care BID;Staff/trained caregiver to provide oral care Other Recommendations: Clarify dietary restrictions   Follow up Recommendations 24 hour supervision/assistance      Frequency and Duration min 2x/week  1 week       Prognosis Prognosis for  Safe Diet Advancement: Fair Barriers to Reach Goals: Cognitive deficits      Swallow Study   General Date of Onset: 05/07/18 HPI: Rachel Morgan  is a 68 y.o. female, with medical history significant forcognitive developmental delay in the setting of cerebral palsy, acquired hypothyroidism, hypertension, stage III chronic kidney disease with baseline creatinine of 1.8, sent Port-A-Cath given she is difficult blood draw, recent prolonged hospitalization secondary to AK I on CKD stage III, and UTI, patient discharged home to her sister care, she presents back to ED with complaints of weakness and shortness of breath, patient with cerebral palsy, cannot provide any history, it was obtained with her sister, apparently patient has with increased work of breathing on exertion recently, was more tachypneic, since her  recent hospitalization with her Aldactone has been stopped ,well per sister patient was less communicative recently(she is minimally verbal at baseline) more lethargic, and less interactive, requiring more assistance on ambulation, where she brought her to ED, patient herself cannot provide any review of system or complaints. Chest x-ray shows: Lower lung volumes. New patchy and indistinct opacity in the left lung, suspicious for aspiration in this clinical setting. Right lung atelectasis. BSE requested. Type of Study: Bedside Swallow Evaluation Previous Swallow Assessment: None on record Diet Prior to this Study: Regular;Thin liquids Temperature Spikes Noted: No Respiratory Status: Nasal cannula History of Recent Intubation: No Behavior/Cognition: Alert;Cooperative;Pleasant mood;Requires cueing;Doesn't follow directions Oral Cavity Assessment: Within Functional Limits Oral Care Completed by SLP: Recent completion by staff Oral Cavity - Dentition: Edentulous Vision: Functional for self-feeding(assist for now; feeds self at home) Self-Feeding Abilities:  Needs assist Patient Positioning:  Upright in chair Baseline Vocal Quality: Normal Volitional Cough: Weak Volitional Swallow: Unable to elicit    Oral/Motor/Sensory Function Overall Oral Motor/Sensory Function: Mild impairment(Pt intermittently followed directions) Facial Symmetry: Within Functional Limits Lingual ROM: Within Functional Limits Lingual Symmetry: Within Functional Limits Mandible: Within Functional Limits   Ice Chips Ice chips: Within functional limits Presentation: Spoon   Thin Liquid Thin Liquid: Within functional limits Presentation: Straw Other Comments: Pt wraps tongue around straw    Nectar Thick Nectar Thick Liquid: Not tested   Honey Thick Honey Thick Liquid: Not tested   Puree Puree: Within functional limits Presentation: Spoon   Solid     Solid: Impaired Presentation: Spoon Oral Phase Impairments: Reduced lingual movement/coordination;Impaired mastication Oral Phase Functional Implications: Prolonged oral transit;Oral residue Other Comments: (liquid wash to clear)     Thank you,  Genene Churn, [place]  , 05/08/2018,12:36 PM

## 2018-05-08 NOTE — Evaluation (Signed)
Physical Therapy Evaluation Patient Details Name: Rachel Morgan MRN: [id number] DOB: 10-23-1950 Today's Date: 05/08/2018   History of Present Illness  Rachel Morgan is a 68 y.o. 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    Clinical Impression  Patient demonstrates labored movement for sitting up at bedside with assistance, limited to a few steps at bedside due to BLE weakness, requires frequent sitting rest breaks before transferring to chair and presently at risk for falls.  Patient tolerated sitting up in chair after therapy - nursing staff informed.  Patient will benefit from continued physical therapy in hospital and recommended venue below to increase strength, balance, endurance for safe ADLs and gait.    Follow Up Recommendations SNF;Supervision/Assistance - 24 hour;Supervision for mobility/OOB    Equipment Recommendations  None recommended by PT    Recommendations for Other Services       Precautions / Restrictions Precautions Precautions: Fall Restrictions Weight Bearing Restrictions: No      Mobility  Bed Mobility Overal bed mobility: Needs Assistance Bed Mobility: Supine to Sit     Supine to sit: Min assist     General bed mobility comments: slow labored movement  Transfers Overall transfer level: Needs assistance Equipment used: Rolling walker (2 wheeled) Transfers: Sit to/from [facility] Sit to Stand: Min assist Stand pivot transfers: Min assist;Mod assist       General transfer comment: increased time, frequent rest breaks  Ambulation/Gait Ambulation/Gait assistance: Mod assist Gait  Distance (Feet): 4 Feet Assistive device: Rolling walker (2 wheeled) Gait Pattern/deviations: Decreased step length - right;Decreased step length - left;Decreased stride length Gait velocity: slow   General Gait Details: slow labored steps with frequent sitting rest breaks before transferring to chair  Stairs            Wheelchair Mobility    Modified Rankin (Stroke Patients Only)       Balance Overall balance assessment: Needs assistance Sitting-balance support: Feet supported;No upper extremity supported Sitting balance-Leahy Scale: Fair     Standing balance support: During functional activity;Bilateral upper extremity supported Standing balance-Leahy Scale: Fair Standing balance comment: using RW                             Pertinent Vitals/Pain Pain Assessment: Faces Faces Pain Scale: Hurts a little bit Pain Location: right flank Pain Descriptors / Indicators: Discomfort Pain Intervention(s): Limited activity within patient's tolerance;Monitored during session    Home Living Family/patient expects to be discharged to:: Private residence Living Arrangements: Other relatives Available Help at Discharge: Family Type of Home: House Home Access: Level entry     Home Layout: One level Home Equipment: Environmental consultant - 2 wheels Additional Comments: Patient is a poor historian, most information take from previous admission    Prior Function Level of Independence: Needs assistance   Gait / Transfers Assistance Needed: short distanced household ambulation with RW  ADL's / Homemaking Assistance Needed: assisted by family        Hand Dominance   Dominant Hand: Right    Extremity/Trunk Assessment   Upper Extremity Assessment Upper Extremity Assessment: Generalized weakness    Lower Extremity Assessment Lower Extremity Assessment: Generalized weakness  Cervical / Trunk Assessment Cervical / Trunk Assessment: Normal  Communication    Communication: Other (comment)  Cognition Arousal/Alertness: Awake/alert Behavior During Therapy: WFL for tasks assessed/performed Overall Cognitive Status: History of cognitive impairments - at baseline                                        General Comments      Exercises     Assessment/Plan    PT Assessment Patient needs continued PT services  PT Problem List Decreased strength;Decreased activity tolerance;Decreased mobility;Decreased balance       PT Treatment Interventions Therapeutic exercise;Gait training;Stair training;Functional mobility training;Therapeutic activities;Patient/family education    PT Goals (Current goals can be found in the Care Plan section)  Acute Rehab PT Goals Patient Stated Goal: return home PT Goal Formulation: With patient Time For Goal Achievement: 05/22/18 Potential to Achieve Goals: Good    Frequency Min 3X/week   Barriers to discharge        Co-evaluation               AM-PAC PT "6 Clicks" Mobility  Outcome Measure Help needed turning from your back to your side while in a flat bed without using bedrails?: A Little Help needed moving from lying on your back to sitting on the side of a flat bed without using bedrails?: A Little Help needed moving to and from a bed to a chair (including a wheelchair)?: A Lot Help needed standing up from a chair using your arms (e.g., wheelchair or bedside chair)?: A Little Help needed to walk in hospital room?: A Lot Help needed climbing 3-5 steps with a railing? : A Lot 6 Click Score: 15    End of Session Equipment Utilized During Treatment: Gait belt Activity Tolerance: Patient tolerated treatment well;Patient limited by fatigue Patient left: in chair;with call bell/phone within reach Nurse Communication: Mobility status PT Visit Diagnosis: Unsteadiness on feet (R26.81);Other abnormalities of gait and mobility (R26.89);Muscle weakness (generalized) (M62.81)    Time:  9150-5697 PT Time Calculation (min) (ACUTE ONLY): 33 min   Charges:   PT Evaluation $PT Eval Moderate Complexity: 1 Mod PT Treatments $Therapeutic Activity: 23-37 mins        12:26 PM, 05/08/18 Lonell Grandchild, MPT Physical Therapist with [facility] [phone] office [phone] mobile phone

## 2018-05-08 NOTE — Progress Notes (Signed)
*  PRELIMINARY RESULTS* Echocardiogram 2D Echocardiogram has been performed.  Rachel Morgan 05/08/2018, 3:06 PM

## 2018-05-08 NOTE — TOC Progression Note (Addendum)
Transition of Care [facility]) - Progression Note    Patient Details  Name: [name] MRN: [id number] Date of Birth: 1950/05/01  Transition of Care [facility]) CM/SW Contact  Jasani Lengel, Chauncey Reading, RN Phone Number: 05/08/2018, 12:46 PM  Clinical Narrative:   Discussed PT eval with [name], patient's sister. She prefers that patient come home with home health services. Reports patient is active with [facility] for RN services. Will send orders for home health PT when available.  Per [name], MD anticipates patient will DC Monday or Tuesday of next week.   ADDENDUM: Call to [facility], they are active with patient for [facility] RN and [facility] RN. Updated that we will be sending orders to add [facility] PT next week.     Expected Discharge Plan: Home/Self Care(Lives with Sister who provides care and supervision) Barriers to Discharge: No Barriers Identified  Expected Discharge Plan and Services Expected Discharge Plan: Home/Self Care(Lives with Sister who provides care and supervision)       Living arrangements for the past 2 months: Single Family Home                             Readmission Risk Interventions Readmission Risk Prevention Plan 05/08/2018 04/24/2018  Transportation Screening Complete Complete  PCP or Specialist Appt within 3-5 Days Complete Not Complete  HRI or Home Care Consult Complete -  Social Work Consult for [place] Planning/Counseling Complete Complete  Palliative Care Screening Not Applicable Not Applicable  Medication Review Press photographer) Complete Complete  Some recent data might be hidden

## 2018-05-08 NOTE — Progress Notes (Addendum)
PROGRESS NOTE    Rachel Morgan  EUM:[id number]  DOB: 12-27-1950  DOA: 05/07/2018 PCP: [facility]   Brief Admission Hx: 68 year old female with cognitive developmental delay and cerebral palsy, hypothyroidism, stage III CKD, Port-A-Cath in place, status post recent hospitalization for UTI presents with weakness and shortness of breath.  MDM/Assessment & Plan:   1. Acute hypoxic respiratory failure- had an episode of aspiration on the floor and now has aspiration pneumonia.  She is on IV meropenem for that I have asked for aspiration precautions and ask for an SLP evaluation.  2D echocardiogram pending. 2. Generalized weakness-progressive weakness per family likely multifactorial.  Her centrally acting psychiatric medications have been reduced as much as possible to see if that will help with the symptoms.  She is being treated for a pneumonia as well. 3. Metabolic encephalopathy-multifactorial we have reduced her Thorazine alprazolam, Zyprexa and holding Ambien. 4. Stage III CKD- gentle IV fluid hydration in the setting of acute kidney injury and dehydration.  Continue sodium bicarbonate tablets. 5. GERD-PPI ordered for GI protection. 6. Cerebral palsy- continue supportive therapy we will follow. 7. Hyperkalemia-treated with IV Lasix.  Lokelma ordered.  Follow renal function panel. 8. Depression/anxiety- we will try to reduce medications as much as possible.  To improve mental status.  DVT prophylaxis: SCDs Code Status: Full Family Communication: Sister updated telephone Disposition Plan: Home with home health likely when medically stable, family not interested in SNF   Consultants:  N/A  Procedures:  N/A  Antimicrobials:  Meropenem 05/08/2018  Subjective: Unable to obtain due to mental status  Objective: Vitals:   05/07/18 2219 05/08/18 0245 05/08/18 0601 05/08/18 0612  BP: (!) 95/52 122/89 (!) 88/42 (!) 116/55  Pulse: 80 71 73 85  Resp:  17 (!) 27 17   Temp: 97.7 F (36.5 C)  97.8 F (36.6 C)   TempSrc: Oral  Oral   SpO2: 100% 96% 96%   Weight:    126.8 kg  Height:        Intake/Output Summary (Last 24 hours) at 05/08/2018 1210 Last data filed at 05/08/2018 0500 Gross per 24 hour  Intake 480 ml  Output 3 ml  Net 477 ml   Filed Weights   05/07/18 1533 05/07/18 2045 05/08/18 0612  Weight: 130 kg 123.5 kg 126.8 kg     REVIEW OF SYSTEMS  As per history otherwise all reviewed and reported negative  Exam:  General exam: Awake, alert, no apparent distress, cooperative. Respiratory system: Rales heard left lower lobe no increased work of breathing. Cardiovascular system: S1 & S2 heard. No JVD, murmurs, gallops, clicks or pedal edema. Gastrointestinal system: Abdomen is nondistended, soft and nontender. Normal bowel sounds heard. Central nervous system: Alert . No focal neurological deficits. Extremities: No cyanosis.  Data Reviewed: Basic Metabolic Panel: Recent Labs  Lab 05/07/18 1612 05/08/18 0640  NA 142 143  K 5.3* 5.2*  CL 110 108  CO2 24 23  GLUCOSE 151* 171*  BUN 29* 39*  CREATININE 2.02* 2.40*  CALCIUM 8.8* 8.6*   Liver Function Tests: Recent Labs  Lab 05/07/18 1612  AST 16  ALT 14  ALKPHOS 79  BILITOT 1.0  PROT 6.5  ALBUMIN 3.5   No results for input(s): LIPASE, AMYLASE in the last 168 hours. No results for input(s): AMMONIA in the last 168 hours. CBC: Recent Labs  Lab 05/07/18 1612 05/08/18 0640  WBC 5.7 19.8*  NEUTROABS 4.3  --   HGB 11.6* 12.6  HCT 37.7 40.3  MCV 99.0 98.3  PLT 252 285   Cardiac Enzymes: No results for input(s): CKTOTAL, CKMB, CKMBINDEX, TROPONINI in the last 168 hours. CBG (last 3)  No results for input(s): GLUCAP in the last 72 hours. Recent Results (from the past 240 hour(s))  Urine culture     Status: None   Collection Time: 04/29/18  1:13 PM  Result Value Ref Range Status   Specimen Description   Final    URINE, CLEAN CATCH Performed at [facility], [address], [place], [place] [address]    Special Requests   Final    NONE Performed at [facility], [address], [place], [place] [address]    Culture   Final    NO GROWTH Performed at [facility], [place] [address] [place], [place] [address]    Report Status 04/30/2018 FINAL  Final     Studies: Dg Chest Port 1 View  Result Date: 05/08/2018 CLINICAL DATA:  68 year old female with vomiting. EXAM: PORTABLE CHEST 1 VIEW COMPARISON:  Or 30 20 and earlier. FINDINGS: Portable AP semi upright view at 0230 hours. Large body habitus. Stable cardiac size and mediastinal contours. Lower lung volumes. New asymmetric patchy and indistinct opacity in the left lung. New linear opacity in the right mid lung which most resembles atelectasis. No pneumothorax or pleural effusion identified. Right chest Port-A-Cath redemonstrated and accessed. Visualized tracheal air column is within normal limits. No acute osseous abnormality identified. IMPRESSION: Lower lung volumes. New patchy and indistinct opacity in the left lung, suspicious for aspiration in this clinical setting. Right lung atelectasis. Electronically Signed   By: Genevie Ann M.D.   On: 05/08/2018 03:13   Dg Chest Portable 1 View  Result Date: 05/07/2018 CLINICAL DATA:  Altered status, unsteady gait, shortness of breath, diabetes mellitus, hypertension, cerebral palsy, GERD EXAM: PORTABLE CHEST 1 VIEW COMPARISON:  Portable exam 1621 hours compared to 04/29/2018 FINDINGS: RIGHT jugular Port-A-Cath with tip projecting over SVC. Enlargement of cardiac silhouette. Mediastinal contours and pulmonary vascularity normal. Question mild atelectasis at RIGHT base. Remaining lungs grossly clear for technique. No definite pleural effusion or pneumothorax. IMPRESSION: Enlargement of cardiac silhouette. Question mild RIGHT basilar atelectasis. Electronically Signed   By: Lavonia Dana M.D.   On: 05/07/2018 16:35     Scheduled Meds: . aspirin EC  81 mg  Oral q morning - 10a  . busPIRone  5 mg Oral BID  . chlorproMAZINE  10 mg Oral TID  . escitalopram  20 mg Oral QHS  . heparin  5,000 Units Subcutaneous Q8H  . levothyroxine  50 mcg Oral Q0600  . OLANZapine  5 mg Oral Daily  . oxybutynin  10 mg Oral q morning - 10a  . pantoprazole  40 mg Oral Daily  . senna-docusate  1 tablet Oral BID  . sodium bicarbonate  650 mg Oral BID  . sodium zirconium cyclosilicate  10 g Oral TID   Continuous Infusions: . sodium chloride 50 mL/hr at 05/08/18 1114  . meropenem (MERREM) IV 1 g (05/08/18 5638)    Active Problems:   Hypertension   Depression with anxiety   CKD (chronic kidney disease), stage III (HCC)   GERD (gastroesophageal reflux disease)   Cerebral palsy (HCC)   Encephalopathy   Hyperkalemia   Acute respiratory failure ([place])   Time spent:   Irwin Brakeman, MD [facility] 05/08/2018, 12:10 PM    LOS: 0 days  How to contact the [facility] Attending or  Consulting provider [place] or covering provider during after hours [place], for this patient?  1. Check the care team in [facility] and look for a) attending/consulting TR[facility] provider listed and b) the [place] team listed 2. Log into [email or web address] and use [place]'s universal password to access. If you do not have the password, please contact the hospital operator. 3. Locate the [facility] provider you are looking for under [facility] and page to a number that you can be directly reached. 4. If you still have difficulty reaching the provider, please page the [facility] (Director on Call) for the Hospitalists listed on amion for assistance.

## 2018-05-08 NOTE — Plan of Care (Signed)
  Problem: Acute Rehab PT Goals(only PT should resolve) Goal: Pt Will Go Supine/Side To Sit Outcome: Progressing Flowsheets (Taken 05/08/2018 1227) Pt will go Supine/Side to Sit: with min guard assist Goal: Patient Will Transfer Sit To/From Stand Outcome: Progressing Flowsheets (Taken 05/08/2018 1227) Patient will transfer sit to/from stand: with min guard assist Goal: Pt Will Transfer Bed To Chair/Chair To Bed Outcome: Progressing Flowsheets (Taken 05/08/2018 1227) Pt will Transfer Bed to Chair/Chair to Bed: min guard assist Goal: Pt Will Ambulate Outcome: Progressing Flowsheets (Taken 05/08/2018 1227) Pt will Ambulate: 25 feet; with rolling walker; with minimal assist   12:28 PM, 05/08/18 Lonell Grandchild, MPT Physical Therapist with [facility] 336 [phone] office [phone] mobile phone

## 2018-05-09 DIAGNOSIS — J9601 Acute respiratory failure with hypoxia: Secondary | ICD-10-CM | POA: Diagnosis present

## 2018-05-09 DIAGNOSIS — J189 Pneumonia, unspecified organism: Secondary | ICD-10-CM | POA: Diagnosis present

## 2018-05-09 DIAGNOSIS — E669 Obesity, unspecified: Secondary | ICD-10-CM | POA: Diagnosis present

## 2018-05-09 DIAGNOSIS — E1122 Type 2 diabetes mellitus with diabetic chronic kidney disease: Secondary | ICD-10-CM | POA: Diagnosis present

## 2018-05-09 DIAGNOSIS — R1310 Dysphagia, unspecified: Secondary | ICD-10-CM | POA: Diagnosis present

## 2018-05-09 DIAGNOSIS — T43595A Adverse effect of other antipsychotics and neuroleptics, initial encounter: Secondary | ICD-10-CM | POA: Diagnosis present

## 2018-05-09 DIAGNOSIS — J690 Pneumonitis due to inhalation of food and vomit: Secondary | ICD-10-CM | POA: Diagnosis present

## 2018-05-09 DIAGNOSIS — E860 Dehydration: Secondary | ICD-10-CM | POA: Diagnosis present

## 2018-05-09 DIAGNOSIS — R8281 Pyuria: Secondary | ICD-10-CM | POA: Diagnosis present

## 2018-05-09 DIAGNOSIS — F79 Unspecified intellectual disabilities: Secondary | ICD-10-CM | POA: Diagnosis present

## 2018-05-09 DIAGNOSIS — G9341 Metabolic encephalopathy: Secondary | ICD-10-CM | POA: Diagnosis present

## 2018-05-09 DIAGNOSIS — K219 Gastro-esophageal reflux disease without esophagitis: Secondary | ICD-10-CM | POA: Diagnosis present

## 2018-05-09 DIAGNOSIS — N183 Chronic kidney disease, stage 3 (moderate): Secondary | ICD-10-CM | POA: Diagnosis present

## 2018-05-09 DIAGNOSIS — E039 Hypothyroidism, unspecified: Secondary | ICD-10-CM | POA: Diagnosis present

## 2018-05-09 DIAGNOSIS — R6250 Unspecified lack of expected normal physiological development in childhood: Secondary | ICD-10-CM | POA: Diagnosis present

## 2018-05-09 DIAGNOSIS — T433X5A Adverse effect of phenothiazine antipsychotics and neuroleptics, initial encounter: Secondary | ICD-10-CM | POA: Diagnosis present

## 2018-05-09 DIAGNOSIS — J9811 Atelectasis: Secondary | ICD-10-CM | POA: Diagnosis present

## 2018-05-09 DIAGNOSIS — G809 Cerebral palsy, unspecified: Secondary | ICD-10-CM | POA: Diagnosis present

## 2018-05-09 DIAGNOSIS — I1310 Hypertensive heart and chronic kidney disease without heart failure, with stage 1 through stage 4 chronic kidney disease, or unspecified chronic kidney disease: Secondary | ICD-10-CM | POA: Diagnosis present

## 2018-05-09 DIAGNOSIS — F418 Other specified anxiety disorders: Secondary | ICD-10-CM | POA: Diagnosis present

## 2018-05-09 DIAGNOSIS — Z87442 Personal history of urinary calculi: Secondary | ICD-10-CM | POA: Diagnosis not present

## 2018-05-09 DIAGNOSIS — G808 Other cerebral palsy: Secondary | ICD-10-CM | POA: Diagnosis not present

## 2018-05-09 DIAGNOSIS — T424X5A Adverse effect of benzodiazepines, initial encounter: Secondary | ICD-10-CM | POA: Diagnosis present

## 2018-05-09 DIAGNOSIS — E875 Hyperkalemia: Secondary | ICD-10-CM | POA: Diagnosis present

## 2018-05-09 DIAGNOSIS — N179 Acute kidney failure, unspecified: Secondary | ICD-10-CM | POA: Diagnosis present

## 2018-05-09 DIAGNOSIS — Z6841 Body Mass Index (BMI) 40.0 and over, adult: Secondary | ICD-10-CM | POA: Diagnosis not present

## 2018-05-09 LAB — CBC WITH DIFFERENTIAL/PLATELET
Abs Immature Granulocytes: 0.04 10*3/uL (ref 0.00–0.07)
Basophils Absolute: 0.1 10*3/uL (ref 0.0–0.1)
Basophils Relative: 0 %
Eosinophils Absolute: 0 10*3/uL (ref 0.0–0.5)
Eosinophils Relative: 0 %
HCT: 35.7 % — ABNORMAL LOW (ref 36.0–46.0)
Hemoglobin: 11 g/dL — ABNORMAL LOW (ref 12.0–15.0)
Immature Granulocytes: 0 %
Lymphocytes Relative: 6 %
Lymphs Abs: 0.9 10*3/uL (ref 0.7–4.0)
MCH: 30.1 pg (ref 26.0–34.0)
MCHC: 30.8 g/dL (ref 30.0–36.0)
MCV: 97.5 fL (ref 80.0–100.0)
Monocytes Absolute: 1.2 10*3/uL — ABNORMAL HIGH (ref 0.1–1.0)
Monocytes Relative: 9 %
Neutro Abs: 11.9 10*3/uL — ABNORMAL HIGH (ref 1.7–7.7)
Neutrophils Relative %: 85 %
Platelets: 237 10*3/uL (ref 150–400)
RBC: 3.66 MIL/uL — ABNORMAL LOW (ref 3.87–5.11)
RDW: 14.9 % (ref 11.5–15.5)
WBC: 14.1 10*3/uL — ABNORMAL HIGH (ref 4.0–10.5)
nRBC: 0 % (ref 0.0–0.2)

## 2018-05-09 LAB — RENAL FUNCTION PANEL
Albumin: 3 g/dL — ABNORMAL LOW (ref 3.5–5.0)
Anion gap: 9 (ref 5–15)
BUN: 51 mg/dL — ABNORMAL HIGH (ref 8–23)
CO2: 25 mmol/L (ref 22–32)
Calcium: 8.1 mg/dL — ABNORMAL LOW (ref 8.9–10.3)
Chloride: 108 mmol/L (ref 98–111)
Creatinine, Ser: 2.91 mg/dL — ABNORMAL HIGH (ref 0.44–1.00)
GFR calc Af Amer: 19 mL/min — ABNORMAL LOW (ref >60)
GFR calc non Af Amer: 16 mL/min — ABNORMAL LOW (ref >60)
Glucose, Bld: 145 mg/dL — ABNORMAL HIGH (ref 70–99)
Phosphorus: 3.9 mg/dL (ref 2.5–4.6)
Potassium: 5.1 mmol/L (ref 3.5–5.1)
Sodium: 142 mmol/L (ref 135–145)

## 2018-05-09 LAB — URINE CULTURE: Culture: <10000 — AB

## 2018-05-09 NOTE — Progress Notes (Signed)
PROGRESS NOTE    Rachel Morgan  IWP:[id number]  DOB: 06/08/1950  DOA: 05/07/2018 PCP: [facility]   Brief Admission Hx: 68 year old female with cognitive developmental delay and cerebral palsy, hypothyroidism, stage III CKD, Port-A-Cath in place, status post recent hospitalization for UTI presents with weakness and shortness of breath.  MDM/Assessment & Plan:   1. Acute hypoxic respiratory failure-Improved now.  Pt had an episode of aspiration on the floor and now has aspiration pneumonia.  She is on IV meropenem for that I have asked for aspiration precautions.  Pt had a SLP evaluation and now on dys 2 diet.   2D echocardiogram below. 2. Generalized weakness-progressive weakness per family likely multifactorial.  Her centrally acting psychiatric medications have been reduced as much as possible to see if that will help with the symptoms.  She is being treated for a pneumonia as well. 3. Metabolic encephalopathy-multifactorial we have reduced her Thorazine alprazolam, Zyprexa and holding Ambien. 4. Stage III CKD- gentle IV fluid hydration in the setting of acute kidney injury and dehydration.  Continue sodium bicarbonate tablets. 5. GERD-PPI ordered for GI protection. 6. Cerebral palsy- continue supportive therapy we will follow. 7. Hyperkalemia-Resolved now.  She was treated with lokelma.   8. Depression/anxiety- attempting to reduce medications as much as possible to improve mental status.  DVT prophylaxis: SCDs Code Status: Full Family Communication: Sister updated telephone Disposition Plan: Home with home health Monday, family not interested in SNF   Consultants:  N/A  Procedures:  N/A  Antimicrobials:  Meropenem 05/08/2018  Procedures: Echocardiogram IMPRESSIONS  1. Images are limited due to body habitus.  2. The left ventricle has normal systolic function, with an ejection fraction of 55-60%. The cavity size was normal. There is  moderately increased left ventricular wall thickness. Left ventricular diastolic Doppler parameters are consistent with  impaired relaxation. Unable to assess focal wall motion.  3. The mitral valve is grossly normal. Mild thickening of the mitral valve leaflet.  4. The tricuspid valve is grossly normal.  5. The aortic valve has an indeterminate number of cusps.  6. The aortic root is normal in size and structure.  7. The interatrial septum was not well visualized.  8. The right ventricle has mildly reduced function. The cavity was normal. There is no increase in right ventricular wall thickness. Right ventricular systolic pressure could not be assessed.   Antimicrobials:  Meropenem 05/08/18 >>  Subjective: Unable to obtain due to condition  Objective: Vitals:   05/09/18 0705 05/09/18 0824 05/09/18 0959 05/09/18 1343  BP: (!) 111/45   (!) 96/45  Pulse: 72   75  Resp:    20  Temp:    98.6 F (37 C)  TempSrc:    Oral  SpO2:  93% 94% 98%  Weight:      Height:        Intake/Output Summary (Last 24 hours) at 05/09/2018 1442 Last data filed at 05/09/2018 1100 Gross per 24 hour  Intake 1904.11 ml  Output -  Net 1904.11 ml   Filed Weights   05/07/18 2045 05/08/18 0612 05/09/18 0500  Weight: 123.5 kg 126.8 kg 122.9 kg     REVIEW OF SYSTEMS  As per history otherwise all reviewed and reported negative  Exam:  General exam: lying in bed awake, alert, NAD, cooperative. Respiratory system: rales LLL. No increased work of breathing. Cardiovascular system: S1 & S2 heard. No JVD, murmurs, gallops, clicks. 1+ pedal edema. Gastrointestinal system: Abdomen is  nondistended, soft and nontender. Normal bowel sounds heard. Central nervous system: Alert. No focal neurological deficits. Extremities: no CCE.  Data Reviewed: Basic Metabolic Panel: Recent Labs  Lab 05/07/18 1612 05/08/18 0640 05/09/18 0454  NA 142 143 142  K 5.3* 5.2* 5.1  CL 110 108 108  CO2 24 23 25   GLUCOSE 151*  171* 145*  BUN 29* 39* 51*  CREATININE 2.02* 2.40* 2.91*  CALCIUM 8.8* 8.6* 8.1*  PHOS  --   --  3.9   Liver Function Tests: Recent Labs  Lab 05/07/18 1612 05/09/18 0454  AST 16  --   ALT 14  --   ALKPHOS 79  --   BILITOT 1.0  --   PROT 6.5  --   ALBUMIN 3.5 3.0*   No results for input(s): LIPASE, AMYLASE in the last 168 hours. No results for input(s): AMMONIA in the last 168 hours. CBC: Recent Labs  Lab 05/07/18 1612 05/08/18 0640 05/09/18 0454  WBC 5.7 19.8* 14.1*  NEUTROABS 4.3  --  11.9*  HGB 11.6* 12.6 11.0*  HCT 37.7 40.3 35.7*  MCV 99.0 98.3 97.5  PLT 252 285 237   Cardiac Enzymes: No results for input(s): CKTOTAL, CKMB, CKMBINDEX, TROPONINI in the last 168 hours. CBG (last 3)  No results for input(s): GLUCAP in the last 72 hours. Recent Results (from the past 240 hour(s))  Urine culture     Status: Abnormal   Collection Time: 05/07/18  4:38 PM  Result Value Ref Range Status   Specimen Description   Final    URINE, RANDOM Performed at [facility], [address] [place], [place] [address]    Special Requests   Final    NONE Performed at [facility], [address] [place], [place] [address]    Culture (A)  Final    <10,000 COLONIES/mL INSIGNIFICANT GROWTH Performed at [facility] [address] [place], [place] [address]    Report Status 05/09/2018 FINAL  Final     Studies: Dg Chest Port 1 View  Result Date: 05/08/2018 CLINICAL DATA:  68 year old female with vomiting. EXAM: PORTABLE CHEST 1 VIEW COMPARISON:  Or 30 20 and earlier. FINDINGS: Portable AP semi upright view at 0230 hours. Large body habitus. Stable cardiac size and mediastinal contours. Lower lung volumes. New asymmetric patchy and indistinct opacity in the left lung. New linear opacity in the right mid lung which most resembles atelectasis. No pneumothorax or pleural effusion identified. Right chest Port-A-Cath redemonstrated and accessed. Visualized tracheal air column is  within normal limits. No acute osseous abnormality identified. IMPRESSION: Lower lung volumes. New patchy and indistinct opacity in the left lung, suspicious for aspiration in this clinical setting. Right lung atelectasis. Electronically Signed   By: Genevie Ann M.D.   On: 05/08/2018 03:13   Dg Chest Portable 1 View  Result Date: 05/07/2018 CLINICAL DATA:  Altered status, unsteady gait, shortness of breath, diabetes mellitus, hypertension, cerebral palsy, GERD EXAM: PORTABLE CHEST 1 VIEW COMPARISON:  Portable exam 1621 hours compared to 04/29/2018 FINDINGS: RIGHT jugular Port-A-Cath with tip projecting over SVC. Enlargement of cardiac silhouette. Mediastinal contours and pulmonary vascularity normal. Question mild atelectasis at RIGHT base. Remaining lungs grossly clear for technique. No definite pleural effusion or pneumothorax. IMPRESSION: Enlargement of cardiac silhouette. Question mild RIGHT basilar atelectasis. Electronically Signed   By: Lavonia Dana M.D.   On: 05/07/2018 16:35     Scheduled Meds: . aspirin EC  81 mg Oral q morning - 10a  .  busPIRone  5 mg Oral BID  . chlorproMAZINE  10 mg Oral TID  . escitalopram  20 mg Oral QHS  . heparin  5,000 Units Subcutaneous Q8H  . levothyroxine  50 mcg Oral Q0600  . OLANZapine  5 mg Oral Daily  . oxybutynin  10 mg Oral q morning - 10a  . pantoprazole  40 mg Oral Daily  . senna-docusate  1 tablet Oral BID  . sodium bicarbonate  650 mg Oral BID   Continuous Infusions: . sodium chloride 50 mL/hr at 05/09/18 0507  . meropenem (MERREM) IV 1 g (05/09/18 0509)    Active Problems:   Hypertension   Depression with anxiety   CKD (chronic kidney disease), stage III (HCC)   GERD (gastroesophageal reflux disease)   Cerebral palsy (HCC)   Encephalopathy   Hyperkalemia   Acute respiratory failure ([place])  Time spent:   Irwin Brakeman, MD [facility] 05/09/2018, 2:42 PM    LOS: 0 days  How to contact the [facility] Attending or Consulting provider  [place] or covering provider during after hours [place], for this patient?  1. Check the care team in [facility] and look for a) attending/consulting TRH provider listed and b) the [facility] team listed 2. Log into [email or web address] and use [place]'s universal password to access. If you do not have the password, please contact the hospital operator. 3. Locate the [facility] provider you are looking for under [facility] and page to a number that you can be directly reached. 4. If you still have difficulty reaching the provider, please page the [facility] (Director on Call) for the Hospitalists listed on amion for assistance.

## 2018-05-10 LAB — RENAL FUNCTION PANEL
Albumin: 2.7 g/dL — ABNORMAL LOW (ref 3.5–5.0)
Anion gap: 8 (ref 5–15)
BUN: 51 mg/dL — ABNORMAL HIGH (ref 8–23)
CO2: 24 mmol/L (ref 22–32)
Calcium: 8.1 mg/dL — ABNORMAL LOW (ref 8.9–10.3)
Chloride: 110 mmol/L (ref 98–111)
Creatinine, Ser: 2.13 mg/dL — ABNORMAL HIGH (ref 0.44–1.00)
GFR calc Af Amer: 27 mL/min — ABNORMAL LOW (ref >60)
GFR calc non Af Amer: 24 mL/min — ABNORMAL LOW (ref >60)
Glucose, Bld: 124 mg/dL — ABNORMAL HIGH (ref 70–99)
Phosphorus: 3 mg/dL (ref 2.5–4.6)
Potassium: 4.9 mmol/L (ref 3.5–5.1)
Sodium: 142 mmol/L (ref 135–145)

## 2018-05-10 LAB — CBC WITH DIFFERENTIAL/PLATELET
Abs Immature Granulocytes: 0.05 10*3/uL (ref 0.00–0.07)
Basophils Absolute: 0.1 10*3/uL (ref 0.0–0.1)
Basophils Relative: 1 %
Eosinophils Absolute: 0.2 10*3/uL (ref 0.0–0.5)
Eosinophils Relative: 2 %
HCT: 34.2 % — ABNORMAL LOW (ref 36.0–46.0)
Hemoglobin: 10.4 g/dL — ABNORMAL LOW (ref 12.0–15.0)
Immature Granulocytes: 1 %
Lymphocytes Relative: 8 %
Lymphs Abs: 0.8 10*3/uL (ref 0.7–4.0)
MCH: 29.9 pg (ref 26.0–34.0)
MCHC: 30.4 g/dL (ref 30.0–36.0)
MCV: 98.3 fL (ref 80.0–100.0)
Monocytes Absolute: 1 10*3/uL (ref 0.1–1.0)
Monocytes Relative: 11 %
Neutro Abs: 7.3 10*3/uL (ref 1.7–7.7)
Neutrophils Relative %: 77 %
Platelets: 216 10*3/uL (ref 150–400)
RBC: 3.48 MIL/uL — ABNORMAL LOW (ref 3.87–5.11)
RDW: 14.6 % (ref 11.5–15.5)
WBC Morphology: INCREASED
WBC: 9.5 10*3/uL (ref 4.0–10.5)
nRBC: 0 % (ref 0.0–0.2)

## 2018-05-10 MED ORDER — DOXYCYCLINE HYCLATE 100 MG PO TABS
100.0000 mg | ORAL_TABLET | Freq: Two times a day (BID) | ORAL | Status: DC
  Administered 2018-05-11: 100 mg via ORAL
  Filled 2018-05-10: qty 1

## 2018-05-10 NOTE — Progress Notes (Signed)
PROGRESS NOTE    Rachel Morgan  IPJ:[id number]  DOB: 09-28-1950  DOA: 05/07/2018 PCP: [facility]   Brief Admission Hx: 68 year old female with cerebral palsy with cognitive developmental delay, CKD stage III, Port-A-Cath in place and status post recent hospitalization for UTI presented with weakness and shortness of breath.  MDM/Assessment & Plan:   1. Acute hypoxic respiratory failure-this has improved.  Patient had an aspiration event that was witnessed and is being treated for aspiration pneumonia.  She has responded well for the treatment.  Her WBC is improving.  She had an SLP evaluation and is now on a dysphasia 2 diet. 2. Generalized weakness- family reported progressive weakness at home.  We have reduced her centrally acting psychiatric medications as much as possible to see if that will help with symptoms.  She seems to be tolerating that so far.  PT has recommended SNF placement however family declines saying they prefer to bring her home with 24/7 supervision. 3. Metabolic encephalopathy- this is resolved now I think it was multifactorial related to multiple psychiatric medications and pneumonia. 4. Stage III CKD-patient responded to gentle IV fluid hydration.  Continue sodium bicarbonate tablets. 5. Cerebral palsy-patient has cognitive developmental delay however she responds well to the environment and has been cooperative with staff and anxious to get home.  She does miss her family members. 6. Hyperkalemia-resolved after treating with Lokelma.  DVT prophylaxis: SCDs Code Status: Full Family Communication: Telephone call to sister for update Disposition Plan: Home with home health on Monday.  Family declines SNF.   Consultants:    Procedures:    Antimicrobials:  Meropenem 05/08/2018 through 05/10/2018  Subjective: Patient is mostly nonvocal and not able to communicate feelings due to condition  Objective: Vitals:   05/09/18 2116  05/10/18 0500 05/10/18 0513 05/10/18 0903  BP: (!) 128/49  (!) 116/46   Pulse: 83  70   Resp:      Temp: 98.4 F (36.9 C)  98 F (36.7 C)   TempSrc: Oral  Oral   SpO2: 91%  90% 93%  Weight:  123.4 kg    Height:        Intake/Output Summary (Last 24 hours) at 05/10/2018 1224 Last data filed at 05/10/2018 0859 Gross per 24 hour  Intake 2818.55 ml  Output 1250 ml  Net 1568.55 ml   Filed Weights   05/08/18 0612 05/09/18 0500 05/10/18 0500  Weight: 126.8 kg 122.9 kg 123.4 kg     REVIEW OF SYSTEMS  As per history otherwise all reviewed and reported negative  Exam:  General exam: Patient sitting up in the chair today she is awake and alert and in no apparent distress.  She is very cooperative. Respiratory system: Clear. No increased work of breathing. Cardiovascular system: S1 & S2 heard. No JVD, murmurs, gallops, clicks or pedal edema. Gastrointestinal system: Abdomen is nondistended, soft and nontender. Normal bowel sounds heard. Central nervous system: Alert and oriented. No focal neurological deficits. Extremities: 1+ edema bilateral lower extremities.  Data Reviewed: Basic Metabolic Panel: Recent Labs  Lab 05/07/18 1612 05/08/18 0640 05/09/18 0454 05/10/18 0606  NA 142 143 142 142  K 5.3* 5.2* 5.1 4.9  CL 110 108 108 110  CO2 24 23 25 24   GLUCOSE 151* 171* 145* 124*  BUN 29* 39* 51* 51*  CREATININE 2.02* 2.40* 2.91* 2.13*  CALCIUM 8.8* 8.6* 8.1* 8.1*  PHOS  --   --  3.9 3.0   Liver Function Tests:  Recent Labs  Lab 05/07/18 1612 05/09/18 0454 05/10/18 0606  AST 16  --   --   ALT 14  --   --   ALKPHOS 79  --   --   BILITOT 1.0  --   --   PROT 6.5  --   --   ALBUMIN 3.5 3.0* 2.7*   No results for input(s): LIPASE, AMYLASE in the last 168 hours. No results for input(s): AMMONIA in the last 168 hours. CBC: Recent Labs  Lab 05/07/18 1612 05/08/18 0640 05/09/18 0454 05/10/18 0606  WBC 5.7 19.8* 14.1* 9.5  NEUTROABS 4.3  --  11.9* 7.3  HGB 11.6* 12.6  11.0* 10.4*  HCT 37.7 40.3 35.7* 34.2*  MCV 99.0 98.3 97.5 98.3  PLT 252 285 237 216   Cardiac Enzymes: No results for input(s): CKTOTAL, CKMB, CKMBINDEX, TROPONINI in the last 168 hours. CBG (last 3)  No results for input(s): GLUCAP in the last 72 hours. Recent Results (from the past 240 hour(s))  Urine culture     Status: Abnormal   Collection Time: 05/07/18  4:38 PM  Result Value Ref Range Status   Specimen Description   Final    URINE, RANDOM Performed at [facility], [address], [place], [place] [address]    Special Requests   Final    NONE Performed at [facility], [address], [place], [place] [address]    Culture (A)  Final    <10,000 COLONIES/mL INSIGNIFICANT GROWTH Performed at [facility] [address] [place], [place] [address]    Report Status 05/09/2018 FINAL  Final     Studies: No results found.   Scheduled Meds: . aspirin EC  81 mg Oral q morning - 10a  . busPIRone  5 mg Oral BID  . chlorproMAZINE  10 mg Oral TID  . [START ON 05/11/2018] doxycycline  100 mg Oral Q12H  . escitalopram  20 mg Oral QHS  . heparin  5,000 Units Subcutaneous Q8H  . levothyroxine  50 mcg Oral Q0600  . OLANZapine  5 mg Oral Daily  . oxybutynin  10 mg Oral q morning - 10a  . pantoprazole  40 mg Oral Daily  . senna-docusate  1 tablet Oral BID  . sodium bicarbonate  650 mg Oral BID   Continuous Infusions: . sodium chloride 35 mL/hr at 05/10/18 0555  . meropenem (MERREM) IV 1 g (05/10/18 0556)    Active Problems:   Hypertension   Depression with anxiety   CKD (chronic kidney disease), stage III (HCC)   GERD (gastroesophageal reflux disease)   Cerebral palsy (HCC)   Encephalopathy   Hyperkalemia   Acute respiratory failure (HCC)   Pneumonia   Time spent:   Irwin Brakeman, MD [facility] 05/10/2018, 12:24 PM    LOS: 1 day  How to contact the [facility] Attending or Consulting provider [place] or covering provider during after hours [place], for this  patient?  1. Check the care team in [facility] and look for a) attending/consulting TRH provider listed and b) the [facility] team listed 2. Log into [email or web address] and use [place]'s universal password to access. If you do not have the password, please contact the hospital operator. 3. Locate the [facility] provider you are looking for under [facility] and page to a number that you can be directly reached. 4. If you still have difficulty reaching the provider, please page the [facility] (Director on Call) for the Hospitalists listed on  amion for assistance.

## 2018-05-10 NOTE — Progress Notes (Signed)
Physical Therapy Treatment Patient Details Name: Rachel Morgan MRN: [id number] DOB: [birth year] Today's Date: 05/10/2018    History of Present Illness Rachel Morgan is a 68 y.o. female with medical history significant for cognitive developmental delay in the setting of cerebral palsy, acquired hypothyroidism, hypertension, stage III chronic kidney disease with baseline creatinine of 1.4-1.8, who is admitted to [facility] on 04/20/2018 with acute metabolic encephalopathy after presenting from home to the [facility] ED for further evaluation of increased somnolence.    PT Comments    Patient required cleaning prior to sitting up at bedside and able to hold onto bed rails when rolling side to side, demonstrated slow labored movement for sitting up at bedside, poor carryover for following instructions when attempting exercises, took three trials of taking 2-3 steps at bedside then having to sit to take a rest break with at least 3-4 minutes recovery time before transferring to chair.  Patient at high risk of falling and ambulation away from bedside not attempted.  Patient tolerated sitting up in chair after therapy.  Patient will benefit from continued physical therapy in hospital and recommended venue below to increase strength, balance, endurance for safe ADLs and gait.    Follow Up Recommendations  SNF;Supervision/Assistance - 24 hour;Supervision for mobility/OOB     Equipment Recommendations  None recommended by PT    Recommendations for Other Services       Precautions / Restrictions Precautions Precautions: Fall Restrictions Weight Bearing Restrictions: No    Mobility  Bed Mobility Overal bed mobility: Needs Assistance Bed Mobility: Rolling;Supine to Sit Rolling: Min assist;Mod assist   Supine to sit: Min assist;Mod assist     General bed mobility comments: slow labored movement  Transfers Overall transfer level: Needs assistance Equipment used: Rolling walker (2  wheeled) Transfers: Sit to/from [facility] Sit to Stand: Min assist Stand pivot transfers: Min assist;Mod assist       General transfer comment: increased time, frequent rest breaks  Ambulation/Gait Ambulation/Gait assistance: Mod assist Gait Distance (Feet): 4 Feet Assistive device: Rolling walker (2 wheeled) Gait Pattern/deviations: Decreased step length - right;Decreased step length - left;Decreased stride length Gait velocity: slow   General Gait Details: limited to up to 3-4 short unsteady labored steps at bedside, has to frequently sit down due to fatigue   Stairs             Wheelchair Mobility    Modified Rankin (Stroke Patients Only)       Balance Overall balance assessment: Needs assistance Sitting-balance support: Feet supported;No upper extremity supported Sitting balance-Leahy Scale: Fair Sitting balance - Comments: fair/good   Standing balance support: During functional activity;Bilateral upper extremity supported Standing balance-Leahy Scale: Fair Standing balance comment: using RW                            Cognition Arousal/Alertness: Awake/alert Behavior During Therapy: WFL for tasks assessed/performed Overall Cognitive Status: History of cognitive impairments - at baseline                                        Exercises General Exercises - Lower Extremity Long Arc Quad: Seated;Strengthening;AROM;5 reps    General Comments        Pertinent Vitals/Pain Pain Assessment: Faces Faces Pain Scale: Hurts a little bit Pain Location: right flank Pain Descriptors / Indicators: Discomfort  Pain Intervention(s): Limited activity within patient's tolerance;Monitored during session    Home Living                      Prior Function            PT Goals (current goals can now be found in the care plan section) Acute Rehab PT Goals Patient Stated Goal: return home PT Goal Formulation:  With patient Time For Goal Achievement: 05/22/18 Potential to Achieve Goals: Good Progress towards PT goals: Progressing toward goals    Frequency    Min 3X/week      PT Plan Current plan remains appropriate    Co-evaluation              AM-PAC PT "6 Clicks" Mobility   Outcome Measure  Help needed turning from your back to your side while in a flat bed without using bedrails?: A Little Help needed moving from lying on your back to sitting on the side of a flat bed without using bedrails?: A Lot Help needed moving to and from a bed to a chair (including a wheelchair)?: A Lot Help needed standing up from a chair using your arms (e.g., wheelchair or bedside chair)?: A Little Help needed to walk in hospital room?: A Lot Help needed climbing 3-5 steps with a railing? : A Lot 6 Click Score: 14    End of Session   Activity Tolerance: Patient tolerated treatment well;Patient limited by fatigue Patient left: in chair;with call bell/phone within reach;with chair alarm set Nurse Communication: Mobility status PT Visit Diagnosis: Unsteadiness on feet (R26.81);Other abnormalities of gait and mobility (R26.89);Muscle weakness (generalized) (M62.81)     Time: 1610-9604 PT Time Calculation (min) (ACUTE ONLY): 34 min  Charges:  $Therapeutic Activity: 23-37 mins                     11:21 AM, 05/10/18 Lonell Grandchild, MPT Physical Therapist with [facility] 336 [phone] office [phone] mobile phone

## 2018-05-11 LAB — CBC WITH DIFFERENTIAL/PLATELET
Abs Immature Granulocytes: 0.09 10*3/uL — ABNORMAL HIGH (ref 0.00–0.07)
Basophils Absolute: 0.1 10*3/uL (ref 0.0–0.1)
Basophils Relative: 1 %
Eosinophils Absolute: 0.3 10*3/uL (ref 0.0–0.5)
Eosinophils Relative: 4 %
HCT: 32.9 % — ABNORMAL LOW (ref 36.0–46.0)
Hemoglobin: 10.2 g/dL — ABNORMAL LOW (ref 12.0–15.0)
Immature Granulocytes: 1 %
Lymphocytes Relative: 14 %
Lymphs Abs: 1.2 10*3/uL (ref 0.7–4.0)
MCH: 29.9 pg (ref 26.0–34.0)
MCHC: 31 g/dL (ref 30.0–36.0)
MCV: 96.5 fL (ref 80.0–100.0)
Monocytes Absolute: 1.1 10*3/uL — ABNORMAL HIGH (ref 0.1–1.0)
Monocytes Relative: 12 %
Neutro Abs: 5.8 10*3/uL (ref 1.7–7.7)
Neutrophils Relative %: 68 %
Platelets: 206 10*3/uL (ref 150–400)
RBC: 3.41 MIL/uL — ABNORMAL LOW (ref 3.87–5.11)
RDW: 14.3 % (ref 11.5–15.5)
WBC: 8.5 10*3/uL (ref 4.0–10.5)
nRBC: 0 % (ref 0.0–0.2)

## 2018-05-11 LAB — RENAL FUNCTION PANEL
Albumin: 2.6 g/dL — ABNORMAL LOW (ref 3.5–5.0)
Anion gap: 7 (ref 5–15)
BUN: 45 mg/dL — ABNORMAL HIGH (ref 8–23)
CO2: 25 mmol/L (ref 22–32)
Calcium: 8.2 mg/dL — ABNORMAL LOW (ref 8.9–10.3)
Chloride: 108 mmol/L (ref 98–111)
Creatinine, Ser: 1.78 mg/dL — ABNORMAL HIGH (ref 0.44–1.00)
GFR calc Af Amer: 34 mL/min — ABNORMAL LOW (ref >60)
GFR calc non Af Amer: 29 mL/min — ABNORMAL LOW (ref >60)
Glucose, Bld: 97 mg/dL (ref 70–99)
Phosphorus: 2.4 mg/dL — ABNORMAL LOW (ref 2.5–4.6)
Potassium: 4.5 mmol/L (ref 3.5–5.1)
Sodium: 140 mmol/L (ref 135–145)

## 2018-05-11 MED ORDER — CHLORPROMAZINE HCL 10 MG PO TABS: 10.0000 mg | ORAL_TABLET | Freq: Three times a day (TID) | ORAL | 0 refills | Status: DC

## 2018-05-11 MED ORDER — OLANZAPINE 5 MG PO TABS: 5.0000 mg | ORAL_TABLET | Freq: Every day | ORAL | 0 refills | Status: DC

## 2018-05-11 MED ORDER — HEPARIN SOD (PORK) LOCK FLUSH 100 UNIT/ML IV SOLN
500.0000 [IU] | INTRAVENOUS | Status: DC | PRN
  Filled 2018-05-11: qty 5

## 2018-05-11 MED ORDER — BUSPIRONE HCL 5 MG PO TABS: 5.0000 mg | ORAL_TABLET | Freq: Two times a day (BID) | ORAL | 0 refills | Status: DC

## 2018-05-11 MED ORDER — DOXYCYCLINE HYCLATE 100 MG PO TABS: 100.0000 mg | ORAL_TABLET | Freq: Two times a day (BID) | ORAL | 0 refills | Status: AC

## 2018-05-11 NOTE — TOC Transition Note (Signed)
Transition of Care [facility]) - CM/SW Discharge Note   Patient Details  Name: Rachel Morgan MRN: [id number] Date of Birth: 11-18-1950  [facility] CM/SW Contact:  Boneta Lucks, RN Phone Number: 05/11/2018, 11:36 AM   Clinical -   Patient is being discharge back home today to stay with sister Rachel Morgan. Patient was recommended for SNF, family refused, states they can take care of her at home with home health.  Pt is active with [facility], called [facility] to make them aware of discharge and faxed new orders to them for RN and PT.    Final next level of care: Home w Home Health Services Barriers to Discharge: No Barriers Identified   Patient Goals and CMS Choice Patient states their goals for this hospitalization and ongoing recovery are:: Patient agrees to go back to sister home with home health care. Pt is active with [facility].      Discharge Placement                  Name of family member notified: Rachel Morgan - Sister Patient and family notified of of transfer: 05/11/18  Discharge Plan and Services                          HH Arranged: RN, PT [facility] Agency: [place] Date [place]: 05/11/18(To make them aware of discharge) Time [place]: 1134         Readmission Risk Interventions Readmission Risk Prevention Plan 05/08/2018 04/24/2018  Transportation Screening Complete Complete  PCP or Specialist Appt within 3-5 Days Complete Not Complete  HRI or Home Care Consult Complete -  Social Work Consult for [place] Planning/Counseling Complete Complete  Palliative Care Screening Not Applicable Not Applicable  Medication Review [employer]) Complete Complete  Some recent data might be hidden

## 2018-05-11 NOTE — Care Management Important Message (Signed)
Important Message  Patient Details  Name: Rachel Morgan MRN: [id number] Date of Birth: 12-Aug-1950   Medicare Important Message Given:  Yes    Tommy Medal 05/11/2018, 1:30 PM

## 2018-05-11 NOTE — Discharge Instructions (Signed)
Acute Kidney Injury, Adult  Acute kidney injury is a sudden worsening of kidney function. The kidneys are organs that have several jobs. They filter the blood to remove waste products and extra fluid. They also maintain a healthy balance of minerals and hormones in the body, which helps control blood pressure and keep bones strong. With this condition, your kidneys do not do their jobs as well as they should. This condition ranges from mild to severe. Over time it may develop into long-lasting (chronic) kidney disease. Early detection and treatment may prevent acute kidney injury from developing into a chronic condition. What are the causes? Common causes of this condition include:  A problem with blood flow to the kidneys. This may be caused by: ? Low blood pressure (hypotension) or shock. ? Blood loss. ? Heart and blood vessel (cardiovascular) disease. ? Severe burns. ? Liver disease.  Direct damage to the kidneys. This may be caused by: ? Certain medicines. ? A kidney infection. ? Poisoning. ? Being around or in contact with toxic substances. ? A surgical wound. ? A hard, direct hit to the kidney area.  A sudden blockage of urine flow. This may be caused by: ? Cancer. ? Kidney stones. ? An enlarged prostate in males. What are the signs or symptoms? Symptoms of this condition may not be obvious until the condition becomes severe. Symptoms of this condition can include:  Tiredness (lethargy), or difficulty staying awake.  Nausea or vomiting.  Swelling (edema) of the face, legs, ankles, or feet.  Problems with urination, such as: ? Abdominal pain, or pain along the side of your stomach (flank). ? Decreased urine production. ? Decrease in the force of urine flow.  Muscle twitches and cramps, especially in the legs.  Confusion or trouble concentrating.  Loss of appetite.  Fever. How is this diagnosed? This condition may be diagnosed with tests, including:  Blood  tests.  Urine tests.  Imaging tests.  A test in which a sample of tissue is removed from the kidneys to be examined under a microscope (kidney biopsy). How is this treated? Treatment for this condition depends on the cause and how severe the condition is. In mild cases, treatment may not be needed. The kidneys may heal on their own. In more severe cases, treatment will involve:  Treating the cause of the kidney injury. This may involve changing any medicines you are taking or adjusting your dosage.  Fluids. You may need specialized IV fluids to balance your body's needs.  Having a catheter placed to drain urine and prevent blockages.  Preventing problems from occurring. This may mean avoiding certain medicines or procedures that can cause further injury to the kidneys. In some cases treatment may also require:  A procedure to remove toxic wastes from the body (dialysis or continuous renal replacement therapy - CRRT).  Surgery. This may be done to repair a torn kidney, or to remove the blockage from the urinary system. Follow these instructions at home: Medicines  Take over-the-counter and prescription medicines only as told by your health care provider.  Do not take any new medicines without your health care provider's approval. Many medicines can worsen your kidney damage.  Do not take any vitamin and mineral supplements without your health care provider's approval. Many nutritional supplements can worsen your kidney damage. Lifestyle  If your health care provider prescribed changes to your diet, follow them. You may need to decrease the amount of protein you eat.  Achieve and maintain a  healthy weight. If you need help with this, ask your health care provider.  Start or continue an exercise plan. Try to exercise at least 30 minutes a day, 5 days a week.  Do not use any tobacco products, such as cigarettes, chewing tobacco, and e-cigarettes. If you need help quitting, ask your  health care provider. General instructions  Keep track of your blood pressure. Report changes in your blood pressure as told by your health care provider.  Stay up to date with immunizations. Ask your health care provider which immunizations you need.  Keep all follow-up visits as told by your health care provider. This is important. Where to find more information  American Association of Kidney Patients: BombTimer.gl  National Kidney Foundation: www.kidney.[place]: https://mathis.com/  Life Options Rehabilitation Program: ? www.lifeoptions.org ? www.kidneyschool.org Contact a health care provider if:  Your symptoms get worse.  You develop new symptoms. Get help right away if:  You develop symptoms of worsening kidney disease, which include: ? Headaches. ? Abnormally dark or light skin. ? Easy bruising. ? Frequent hiccups. ? Chest pain. ? Shortness of breath. ? End of menstruation in women. ? Seizures. ? Confusion or altered mental status. ? Abdominal or back pain. ? Itchiness.  You have a fever.  Your body is producing less urine.  You have pain or bleeding when you urinate. Summary  Acute kidney injury is a sudden worsening of kidney function.  Acute kidney injury can be caused by problems with blood flow to the kidneys, direct damage to the kidneys, and sudden blockage of urine flow.  Symptoms of this condition may not be obvious until it becomes severe. Symptoms may include edema, lethargy, confusion, nausea or vomiting, and problems passing urine.  This condition can usually be diagnosed with blood tests, urine tests, and imaging tests. Sometimes a kidney biopsy is done to diagnose this condition.  Treatment for this condition often involves treating the underlying cause. It is treated with fluids, medicines, dialysis, diet changes, or surgery. This information is not intended to replace advice given to you by your health care provider. Make  sure you discuss any questions you have with your health care provider. Document Released: 07/09/2010 Document Revised: 04/25/2016 Document Reviewed: 12/15/2015 Elsevier Interactive Patient Education  2019 [place]. Please keep appointment with Dr. Alyson Ingles on 05/13/2018 at [facility] to have stent removed as previously had been arranged.  The stent will be removed in the office.  Aspiration precautions recommended.  Dysphagia 2 diet recommended.   Acute Kidney Injury, Adult  Acute kidney injury is a sudden worsening of kidney function. The kidneys are organs that have several jobs. They filter the blood to remove waste products and extra fluid. They also maintain a healthy balance of minerals and hormones in the body, which helps control blood pressure and keep bones strong. With this condition, your kidneys do not do their jobs as well as they should. This condition ranges from mild to severe. Over time it may develop into long-lasting (chronic) kidney disease. Early detection and treatment may prevent acute kidney injury from developing into a chronic condition. What are the causes? Common causes of this condition include:  A problem with blood flow to the kidneys. This may be caused by: ? Low blood pressure (hypotension) or shock. ? Blood loss. ? Heart and blood vessel (cardiovascular) disease. ? Severe burns. ? Liver disease.  Direct damage to the kidneys. This may be caused by: ? Certain medicines. ? A  kidney infection. ? Poisoning. ? Being around or in contact with toxic substances. ? A surgical wound. ? A hard, direct hit to the kidney area.  A sudden blockage of urine flow. This may be caused by: ? Cancer. ? Kidney stones. ? An enlarged prostate in males. What are the signs or symptoms? Symptoms of this condition may not be obvious until the condition becomes severe. Symptoms of this condition can include:  Tiredness (lethargy), or difficulty staying  awake.  Nausea or vomiting.  Swelling (edema) of the face, legs, ankles, or feet.  Problems with urination, such as: ? Abdominal pain, or pain along the side of your stomach (flank). ? Decreased urine production. ? Decrease in the force of urine flow.  Muscle twitches and cramps, especially in the legs.  Confusion or trouble concentrating.  Loss of appetite.  Fever. How is this diagnosed? This condition may be diagnosed with tests, including:  Blood tests.  Urine tests.  Imaging tests.  A test in which a sample of tissue is removed from the kidneys to be examined under a microscope (kidney biopsy). How is this treated? Treatment for this condition depends on the cause and how severe the condition is. In mild cases, treatment may not be needed. The kidneys may heal on their own. In more severe cases, treatment will involve:  Treating the cause of the kidney injury. This may involve changing any medicines you are taking or adjusting your dosage.  Fluids. You may need specialized IV fluids to balance your body's needs.  Having a catheter placed to drain urine and prevent blockages.  Preventing problems from occurring. This may mean avoiding certain medicines or procedures that can cause further injury to the kidneys. In some cases treatment may also require:  A procedure to remove toxic wastes from the body (dialysis or continuous renal replacement therapy - CRRT).  Surgery. This may be done to repair a torn kidney, or to remove the blockage from the urinary system. Follow these instructions at home: Medicines  Take over-the-counter and prescription medicines only as told by your health care provider.  Do not take any new medicines without your health care provider's approval. Many medicines can worsen your kidney damage.  Do not take any vitamin and mineral supplements without your health care provider's approval. Many nutritional supplements can worsen your kidney  damage. Lifestyle  If your health care provider prescribed changes to your diet, follow them. You may need to decrease the amount of protein you eat.  Achieve and maintain a healthy weight. If you need help with this, ask your health care provider.  Start or continue an exercise plan. Try to exercise at least 30 minutes a day, 5 days a week.  Do not use any tobacco products, such as cigarettes, chewing tobacco, and e-cigarettes. If you need help quitting, ask your health care provider. General instructions  Keep track of your blood pressure. Report changes in your blood pressure as told by your health care provider.  Stay up to date with immunizations. Ask your health care provider which immunizations you need.  Keep all follow-up visits as told by your health care provider. This is important. Where to find more information  American Association of Kidney Patients: BombTimer.gl  National Kidney Foundation: www.kidney.[place]: https://mathis.com/  Life Options Rehabilitation Program: ? www.lifeoptions.org ? www.kidneyschool.org Contact a health care provider if:  Your symptoms get worse.  You develop new symptoms. Get help right away if:  You develop symptoms  of worsening kidney disease, which include: ? Headaches. ? Abnormally dark or light skin. ? Easy bruising. ? Frequent hiccups. ? Chest pain. ? Shortness of breath. ? End of menstruation in women. ? Seizures. ? Confusion or altered mental status. ? Abdominal or back pain. ? Itchiness.  You have a fever.  Your body is producing less urine.  You have pain or bleeding when you urinate. Summary  Acute kidney injury is a sudden worsening of kidney function.  Acute kidney injury can be caused by problems with blood flow to the kidneys, direct damage to the kidneys, and sudden blockage of urine flow.  Symptoms of this condition may not be obvious until it becomes severe. Symptoms may include edema,  lethargy, confusion, nausea or vomiting, and problems passing urine.  This condition can usually be diagnosed with blood tests, urine tests, and imaging tests. Sometimes a kidney biopsy is done to diagnose this condition.  Treatment for this condition often involves treating the underlying cause. It is treated with fluids, medicines, dialysis, diet changes, or surgery. This information is not intended to replace advice given to you by your health care provider. Make sure you discuss any questions you have with your health care provider. Document Released: 07/09/2010 Document Revised: 04/25/2016 Document Reviewed: 12/15/2015 Elsevier Interactive Patient Education  2019 Elsevier Inc.    Dysphagia 2 Eating Plan, Minced and Moist Foods This eating plan is for people with moderate swallowing problems who are transitioning from pureed to solid foods. Moist and minced foods are soft and cut into very small chunks so that they can be swallowed safely. On this eating plan, you may be instructed to drink liquids that are thickened. Work with your health care provider and your diet and nutrition specialist (dietitian) to make sure that you are following the diet safely and getting all the nutrients you need. What are tips for following this plan? General guidelines for foods   You may eat foods that are soft and moist.  Always test food texture before taking a bite. Poke food with a fork or spoon to make sure it is tender.  Take small bites. Each bite should be smaller than your little finger nail (about 4 mm by 4 mm).  If you were on a pureed food eating plan, you may still eat any of the foods included in that diet.  Avoid foods that are dry, hard, sticky, chewy, coarse, or crunchy.  Avoid foods that separate into thin liquids and solids, such as cereal with milk or chunky soups.  Avoid liquids that have seeds or chunks.  If instructed by your health care provider, thicken liquids. Follow  your health care provider's instructions about what products to use, how to do this, and to what thickness. ? You may use a commercial thickener, rice cereal, or potato flakes. ? Thickened liquids are usually a pudding-like consistency, or they may be as thick as honey or thick enough to eat with a spoon. Cooking  You may need to use a blender, whisk, or masher to soften some of your foods.  To moisten foods, you may add liquids while you are blending, mashing, or grinding your foods to the right consistency. These liquids include gravies, sauces, vegetable or fruit juice, milk, half and half, or water.  Reheat foods slowly to prevent a tough crust from forming. Meal planning  Eat a variety of foods in order to get all the nutrients you need.  Follow your meal plan as told by your  health care provider or dietitian. What foods are allowed? Grains Soaked soft breads without nuts or seeds. Pancakes, sweet rolls, pastries, and [place] toast that have been moistened with syrup or sauce. Well-cooked pasta, noodles, rice, and bread dressing in very small pieces and thick sauce. Soft dumplings or spaetzle in very small pieces and butter or gravy. Soft-cooked cereals. Vegetables Very soft, well-cooked vegetables in very small pieces. Soft-cooked, mashed potatoes. Thickened vegetable juice. Fruits Canned or cooked fruits that are soft or moist and do not have skin or seeds. Fresh, soft bananas. Thickened fruit juices. Meat and other protein foods Tender, moist, and finely minced or ground meats or poultry. Moist meatballs or meatloaf. Fish without bones. Scrambled, poached, or soft-cooked eggs. Tofu. Tempeh and meat alternatives in very small pieces. Well-cooked, moistened and mashed beans, baked beans, peas, and other legumes. Dairy Thickened milk. Cream cheese. Yogurt. Cottage cheese. Sour cream. Fats and oils Butter. Margarine. Cream for cereal, depending on liquid consistency allowed. Gravy.  Cream sauces. Mayonnaise. Sweets and desserts Pudding. Custard. Ice cream and sherbet. Whipped toppings. Soft, moist cakes. Icing. Jelly. Jams and preserves without seeds. Seasoning and other foods Sauces and salsas that have soft chunks that are smaller than 52mm. Salad dressings. Casseroles with small pieces of tender meat. All seasonings and sweeteners. Beverages Anything prepared at the thickness recommended by your dietitian. What foods are not allowed? Grains Breads that are hard or have nuts or seeds. Dry biscuits, pancakes, waffles, and bread dressing. Coarse cereals. Cereals that have nuts, seeds, dried fruits, or coconut. Sticky rice. Large pieces of pasta. Vegetables All raw vegetables. Tough, fibrous, chewy, or stringy cooked vegetables, such as celery, peas, broccoli, cabbage, Brussels sprouts, and asparagus. Potato skins. Potato and other vegetable chips. Fried or French-fried potatoes. Cooked corn and peas. Fruits Hard, crunchy, stringy, high-pulp, and juicy raw fruits such as apples, pineapple, papaya, and watermelon. Fruits with skins and seeds, such as grapes. Dried fruit and fruit leather. Meats and other protein foods Large pieces of meat. Dry, tough meats, such as bacon, sausage, and hot dogs. Chicken, [place], or fish with skin and bones. Crunchy peanut butter. Nuts. Seeds. Dairy Yogurt with nuts, seeds, or large chunks. Large chunks of cheese. Frozen desserts and milk consistency not allowed by your dietitian. Sweets and desserts Coarse, hard, chewy, or sticky desserts. Any dessert with nuts, seeds, coconut, pineapple, or dried fruit. Bread pudding. Seasoning and other foods Soups and casseroles with large chunks. Sandwiches. Pizza. Summary  Moist and minced foods can be helpful for people with moderate swallowing problems.  On the dysphagia eating plan, you may eat foods that are soft, moist, and cut into pieces smaller than 60mm by 16mm.  You may be instructed to  thicken liquids. Follow your health care provider's instructions about how to do this and to what consistency. This information is not intended to replace advice given to you by your health care provider. Make sure you discuss any questions you have with your health care provider. Document Released: 12/24/2004 Document Revised: 04/05/2016 Document Reviewed: 04/05/2016 Elsevier Interactive Patient Education  2019 Elsevier Inc.  Aspiration Precautions, Adult Aspiration is the breathing in (inhalation) of a liquid or object into the lungs. Things that can be inhaled into the lungs include:  Food.  Any type of liquid, such as drinks or saliva.  Stomach contents, such as vomit or stomach acid. What are the signs of aspiration? Signs of aspiration include:  Coughing after swallowing food or liquids.  Clearing the  throat often while eating.  Trouble breathing. This may include: ? Breathing quickly. ? Breathing very slowly. ? Loud breathing. ? Rumbling sounds from the lungs while breathing.  Coughing up phlegm (sputum) that: ? Is yellow, tan, or green. ? Has pieces of food in it. ? Is bad-smelling.  Having a hoarse, barky cough.  Not being able to speak.  A hoarse voice.  Drooling while eating.  A feeling of fullness in the throat or a feeling that something is stuck in the throat.  Choking often.  Having a runny noise while eating.  Coughing when lying down or having to sit up quickly after lying down.  A change in skin color. The skin may look red or blue.  Fever.  Watery eyes.  Pain in the chest or back.  A pained look on the face. What are the complications of aspiration? Complications of aspiration include:  Losing weight because the person is not absorbing needed nutrients.  Loss of enjoyment and the social benefits of eating.  Choking.  Lung irritation, if someone aspirates acidic food or drinks.  Lung infection (pneumonia).  Collection of infected  liquid (pus) in the lungs (lung abscess). In serious cases, death can occur. What can I do to prevent aspiration? Caring for someone who has a feeding tube If you are caring for someone who has a feeding tube who cannot eat or drink safely through his or her mouth:  Keep the person in an upright position as much as possible.  Do not lay the person flat if he or she is getting continuous feedings. If you need to lay the person flat for any reason, turn the feeding pump off.  Check feeding tube residuals as told by your health care provider. Ask your health care provider what residual amount is too high. Caring for someone who can eat and drink safely by mouth If you are caring for someone who can eat and drink safely through his or her mouth:  Have the person sit in an upright position when eating food or drinking fluids. This can be done in two ways: ? Have the person sit up in a chair. ? If sitting in a chair is not possible, position the person in bed so he or she is upright.  Remind the person to eat slowly and chew well. Make sure the person is awake and alert while eating.  Do not distract the person. This is especially important for people with thinking or memory (cognitive) problems.  Allow foods to cool. Hot foods may be more difficult to swallow.  Provide small meals more frequently, instead of 3 large meals. This may reduce fatigue during eating.  Check the person's mouth thoroughly for leftover food after eating.  Keep the person sitting upright for 30-45 minutes after eating.  Do not serve food or drink during 2 hours or more before bedtime. General instructions Follow these general guidelines to prevent aspiration in someone who can eat and drink safely by mouth:  Never put food or liquids in the mouth of a person who is not fully alert.  Feed small amounts of food. Do not force feed.  For a person who is on a diet for swallowing difficulty (dysphagia diet), follow  the recommended food and drink consistency. For example, in dysphagia diet level 1, thicken liquids to pudding-like consistency.  Use as little water as possible when brushing the person's teeth or cleaning his or her mouth.  Provide oral care before and after  meals.  Use adaptive devices such as cut-out cups, straws, or utensils as told by the health care provider.  Crush pills and put them in soft food such as pudding or ice cream. Some pills should not be crushed. Check with the health care provider before crushing any medicine. Contact a health care provider if:  The person has a feeding tube, and the feeding tube residual amount is too high.  The person has a fever.  The person tries to avoid food or water, such as refusing to eat, drink, or be fed, or is eating less than normal.  The person may have aspirated food or liquid.  You notice warning signs, such as choking or coughing, when the person eats or drinks. Get help right away if:  The person has trouble breathing or starts to breathe quickly.  The person is breathing very slowly or stops breathing.  The person coughs a lot after eating or drinking.  The person has a long-lasting (chronic) cough.  The person coughs up thick, yellow, or tan sputum.  If someone is choking on food or an object, perform the Heimlich maneuver (abdominal thrusts).  The person has symptoms of pneumonia, such as: ? Coughing a lot. ? Coughing up mucus with a bad smell or blood in it. ? Feeling short of breath. ? Complaining of chest pain. ? Sweating, fever, and chills. ? Feeling tired. ? Complaining of trouble breathing. ? Wheezing.  The person cannot stop choking.  The person is unable to breathe, turns blue, faints, or seems confused. These symptoms may represent a serious problem that is an emergency. Do not wait to see if the symptoms will go away. Get medical help right away. Call your local emergency services (911 in the [place]    Summary  Aspiration is the breathing in (inhalation) of a liquid or object into the lungs. Things that can be inhaled into the lungs include food, liquids, saliva, or stomach contents.  Aspiration can cause pneumonia or choking.  One sign of aspiration is coughing after swallowing food or liquids.  Contact a health care provider if you notice signs of aspiration. This information is not intended to replace advice given to you by your health care provider. Make sure you discuss any questions you have with your health care provider. Document Released: 01/26/2010 Document Revised: 09/21/2015 Document Reviewed: 09/21/2015 Elsevier Interactive Patient Education  2019 [place].  Aspiration Pneumonia Aspiration pneumonia is an infection in the lungs. It occurs when saliva or liquid contaminated with bacteria is inhaled (aspirated) into the lungs. When these things get into the lungs, swelling (inflammation) and infection can occur. This can make it difficult to breathe. Aspiration pneumonia is a serious condition and can be life threatening. What are the causes? This condition is caused when saliva or liquid from the mouth, throat, or stomach is inhaled into the lungs, and when those fluids are contaminated with bacteria. What increases the risk? The following factors may make you more likely to develop this condition:  A narrowing of the tube that carries food to the stomach (esophageal narrowing).  Having gastroesophageal reflux disease (GERD).  Having a weak immune system.  Having diabetes.  Having poor oral hygiene.  Being malnourished. The condition is more likely to occur when a person's cough (gag) reflex, or ability to swallow, has decreased. Some things that can cause this decrease include:  Having a brain injury or disease, such as stroke, seizures, Parkinson disease, dementia, or amyotrophic lateral sclerosis (ALS).  Being given a general anesthetic for  procedures.  Drinking too much alcohol. If a person passes out and vomits, vomit can be inhaled into the lungs.  Taking certain medicines, such as tranquilizers or sedatives. What are the signs or symptoms? Symptoms of this condition include:  Fever.  A cough with secretions that are yellow, tan, or green.  Breathing problems, such as wheezing or shortness of breath.  Chest pain.  Being more tired than usual (fatigue).  Having a history of coughing while eating or drinking.  Bad breath.  Bluish color to the lips, skin, or fingers. How is this diagnosed? This condition may be diagnosed based on:  A physical exam.  Tests, such as: ? Chest X-ray. ? Sputum culture. Saliva and mucus (sputum) are collected from the lungs or the tubes that carry air to the lungs (bronchi). The sputum is then tested for bacteria. ? Oximetry. A sensor or clip is placed on areas such as a finger, earlobe, or toe to measure the oxygen level in your blood. ? Blood tests. ? Swallowing study. This test looks at how food is swallowed and whether it goes into your breathing tube (trachea) or esophagus. ? Bronchoscopy. This test uses a flexible tube (bronchoscope) to see inside the lungs. How is this treated? This condition may be treated with:  Medicines. Antibiotic medicine will be given to kill the pneumonia bacteria. Other medicines may also be used to reduce fever or pain.  Breathing assistance and oxygen therapy. Depending on how well you are breathing, you may need to be given oxygen, or you may need breathing support from a breathing machine (ventilator).  Thoracentesis. This is a procedure to remove fluid that has built up in the space between the linings of the chest wall and the lungs.  Feeding tube and diet change. For people who have difficulty swallowing, a feeding tube might be placed in the stomach, or they may be asked to avoid certain food textures or liquids when eating. Follow these  instructions at home: Medicines  Take over-the-counter and prescription medicines only as told by your health care provider. ? If you were prescribed an antibiotic medicine, take it as told by your health care provider. Do not stop taking the antibiotic even if you start to feel better. ? Take cough medicine only if you are losing sleep. Cough medicine can prevent your bodys natural ability to remove mucus from your lungs. General instructions  Carefully follow any eating instructions you were given, such as avoiding certain food textures or thickening your liquids. Thickening liquids reduces the risk of developing aspiration pneumonia again.  Use breathing exercises such as postural drainage, deep breathing, and incentive spirometry to help expel secretions.  Rest as instructed by your health care provider.  Sleep in a semi-upright position at night. Try to sleep in a reclining chair, or place a few pillows under your head.  Do not use any products that contain nicotine or tobacco, such as cigarettes and e-cigarettes. If you need help quitting, ask your health care provider.  Keep all follow-up visits as told by your health care provider. This is important. Contact a health care provider if:  You have a fever.  You have a worsening cough with yellow, tan, or green secretions.  You have coughing while eating or drinking. Get help right away if:  You have worsening shortness of breath, wheezing, or difficulty breathing.  You have chest pain. Summary  Aspiration pneumonia is an infection in the lungs.  It is caused when saliva or liquid from the mouth, throat, or stomach is inhaled into the lungs.  Aspiration pneumonia is more likely to occur when a person's cough reflex or ability to swallow has decreased.  Symptoms of aspiration pneumonia include coughing, breathing problems, fever, and chest pain.  Aspiration pneumonia may be treated with antibiotic medicine, other medicines  to reduce pain or fever, and breathing assistance or oxygen therapy. This information is not intended to replace advice given to you by your health care provider. Make sure you discuss any questions you have with your health care provider. Document Released: 10/21/2008 Document Revised: 01/30/2016 Document Reviewed: 01/30/2016 Elsevier Interactive Patient Education  2019 [employer].

## 2018-05-11 NOTE — Progress Notes (Signed)
Physical Therapy Treatment Patient Details Name: Rachel Morgan MRN: [id number] DOB: May 31, 1950 Today's Date: 05/11/2018    History of Present Illness Rachel Morgan is a 68 y.o. 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    PT Comments    Patient demonstrates increased BLE strength for sit to stands, transfers and taking steps at bedside.  Has difficulty using BUE for sitting up at bedside mostly due to body habitus and has difficulty scooting forward.  Patient took a few steps at bedside to transfer to chair without having to take sitting rest breaks.  Patient will benefit from continued physical therapy in hospital and recommended venue below to increase strength, balance, endurance for safe ADLs and gait.   Follow Up Recommendations  SNF;Supervision/Assistance - 24 hour;Supervision for mobility/OOB     Equipment Recommendations  None recommended by PT    Recommendations for Other Services       Precautions / Restrictions Precautions Precautions: Fall Restrictions Weight Bearing Restrictions: No    Mobility  Bed Mobility Overal bed mobility: Needs Assistance Bed Mobility: Supine to Sit Rolling: Mod assist         General bed mobility comments: slow labored movement, unable to prop up on elbows to hands during supine to sitting  Transfers Overall transfer level: Needs assistance Equipment used: Rolling walker (2 wheeled) Transfers: Sit to/from [facility] Sit to Stand: Min guard Stand pivot transfers: Min guard       General transfer comment: increased BLE strength for sit to  stands/transfers  Ambulation/Gait Ambulation/Gait assistance: Min guard Gait Distance (Feet): 6 Feet Assistive device: Rolling walker (2 wheeled) Gait Pattern/deviations: Decreased step length - right;Decreased step length - left;Decreased stride length Gait velocity: decreased   General Gait Details: increased BLE strength/balance for taking steps with less rest breaks   Stairs             Wheelchair Mobility    Modified Rankin (Stroke Patients Only)       Balance Overall balance assessment: Needs assistance Sitting-balance support: Feet supported;No upper extremity supported Sitting balance-Leahy Scale: Fair Sitting balance - Comments: fair/good   Standing balance support: Bilateral upper extremity supported;During functional activity Standing balance-Leahy Scale: Fair Standing balance comment: using RW                            Cognition Arousal/Alertness: Awake/alert Behavior During Therapy: WFL for tasks assessed/performed Overall Cognitive Status: History of cognitive impairments - at baseline                                        Exercises General Exercises - Lower Extremity Ankle Circles/Pumps: Seated;AAROM;Strengthening;Both;10 reps Long Arc Quad: Seated;Strengthening;AAROM;Both;10 reps    General Comments        Pertinent Vitals/Pain Pain Assessment: No/denies pain    Home Living                      Prior Function            PT Goals (current goals can now be found in the  care plan section) Acute Rehab PT Goals Patient Stated Goal: return home PT Goal Formulation: With patient Time For Goal Achievement: 05/22/18 Potential to Achieve Goals: Good Progress towards PT goals: Progressing toward goals    Frequency    Min 3X/week      PT Plan Current plan remains appropriate    Co-evaluation              AM-PAC PT "6 Clicks" Mobility   Outcome Measure  Help needed turning from your  back to your side while in a flat bed without using bedrails?: A Little Help needed moving from lying on your back to sitting on the side of a flat bed without using bedrails?: A Lot Help needed moving to and from a bed to a chair (including a wheelchair)?: A Little Help needed standing up from a chair using your arms (e.g., wheelchair or bedside chair)?: A Little Help needed to walk in hospital room?: A Lot Help needed climbing 3-5 steps with a railing? : A Lot 6 Click Score: 15    End of Session   Activity Tolerance: Patient tolerated treatment well;Patient limited by fatigue Patient left: in chair;with call bell/phone within reach;with chair alarm set Nurse Communication: Mobility status PT Visit Diagnosis: Unsteadiness on feet (R26.81);Other abnormalities of gait and mobility (R26.89);Muscle weakness (generalized) (M62.81)     Time: 9379-0240 PT Time Calculation (min) (ACUTE ONLY): 28 min  Charges:  $Therapeutic Exercise: 8-22 mins $Therapeutic Activity: 8-22 mins                     9:14 AM, 05/11/18 Lonell Grandchild, MPT Physical Therapist with [facility] [phone] office [phone] mobile phone

## 2018-05-11 NOTE — Discharge Summary (Addendum)
Physician Discharge Summary  LETTICIA BHATTACHARYYA OYD:[id number] DOB: [birth year] DOA: 05/07/2018  PCP: [facility]ist: [place]enzie  Admit date: 05/07/2018 Discharge date: 05/11/2018  Admitted From:  Home  Disposition: Home   Recommendations for Outpatient Follow-up:  1. Follow up with PCP in 1 weeks 2. Follow up with urologist on 05/13/18 for scheduled stent removal 3. Establish care with nephrologist for chronic kidney disease   Home Health: RN, PT, SLP, SW  Discharge Condition: STABLE   CODE STATUS: FULL    Brief Hospitalization Summary: Please see all hospital notes, images, labs for full details of the hospitalization. HPI:  Rachel Morgan  is a 68 y.o. female, with medical history significant forcognitive developmental delay in the setting of cerebral palsy, acquired hypothyroidism, hypertension, stage III chronic kidney disease with baseline creatinine of 1.8, sent Port-A-Cath given she is difficult blood draw, recent prolonged hospitalization secondary to [place] I on CKD stage III, and UTI, patient discharged home to her sister care, she presents back to ED with complaints of weakness and shortness of breath, patient with cerebral palsy, cannot provide any history, it was obtained with her sister, apparently patient has with increased work of breathing on exertion recently, was more tachypneic, since her  recent hospitalization with her Aldactone has been stopped ,well per sister patient was less communicative recently(she is minimally verbal at baseline) more lethargic, and less interactive, requiring more assistance on ambulation, where she brought her to ED, patient herself cannot provide any review of system or complaints. - in ED creatinine elevated at 2, which is around her baseline, she was afebrile, with no leukocytosis, UA had some pyuria, but it was not a clean sample with some squamous cell, chest x-ray significant for atelectasis, she was noted to have  toxemia 87-88 on room air while laying supine intermittently, I was called to admit for further evaluation.  Brief Admission Hx: 68 year old female with cerebral palsy with cognitive developmental delay, CKD stage III, Port-A-Cath in place and status post recent hospitalization for UTI presented with weakness and shortness of breath.  MDM/Assessment & Plan:   1. Acute hypoxic respiratory failure-this has resolved now.  Patient had an aspiration event that was witnessed and is being treated for aspiration pneumonia.  She has responded well for the treatment.  Her WBC is improving.  She had an SLP evaluation and is now on a dysphasia 2 diet.  I have spoken with family and given information on dys 2 diet and ordered for home health SLP and aspiration precautions.  2. Generalized weakness- family reported progressive weakness at home.  We have reduced her centrally acting psychiatric medications as much as possible to see if that will help with symptoms and that seems to have helped.   She seems to be tolerating the reductions well with no behavioral problems noted and I recommend further reduction in her psychiatric medications in the outpatient setting.  PT has recommended SNF placement however family repeatedly declines saying they prefer to bring her home with 24/7 supervision. 3. Metabolic encephalopathy- this is resolved now I think it was multifactorial related to multiple psychiatric medications and pneumonia. 4. Stage III CKD-patient responded to gentle IV fluid hydration.  Continue sodium bicarbonate tablets.  I have recommended that patient establish care with nephrologist Dr. Lowanda Foster.  5. Cerebral palsy-patient has cognitive developmental delay however she responds well to the environment and has been cooperative with staff and anxious to get home.  She does miss her family members.  6. Hyperkalemia-resolved after treating with Lokelma. 7. Stent Removal: Pt's family says patient scheduled for  stent removal with Dr. Alyson Ingles 05/13/18 and wanted it done in hospital. I spoke with [facility] and they noted that it is usually done as an outpatient in the office and patient should keep appointment as scheduled.  I conveyed that to patient's sister [name] who verbalized understanding.   DVT prophylaxis: SCDs Code Status: Full Family Communication: Telephone call to sister Disposition Plan: Home with home health.   Family declines SNF repeatedly  Consultants:    Procedures:    Antimicrobials:  Meropenem 05/08/2018 through 05/10/2018   Doxycycline 5/4 >  Discharge Diagnoses:  Active Problems:   Hypertension   Depression with anxiety   CKD (chronic kidney disease), stage III (HCC)   GERD (gastroesophageal reflux disease)   Cerebral palsy (HCC)   Encephalopathy   Hyperkalemia   Acute respiratory failure (HCC)   Pneumonia   Discharge Instructions: Discharge Instructions    Call MD for:  extreme fatigue   Complete by:  As directed    Increase activity slowly   Complete by:  As directed      Allergies as of 05/11/2018      Reactions   Macrobid [nitrofurantoin Monohyd Macro] Hives, Swelling   Penicillins Swelling, Rash   Has patient had a PCN reaction causing immediate rash, facial/tongue/throat swelling, SOB or lightheadedness with hypotension: Yes Has patient had a PCN reaction causing severe rash involving mucus membranes or skin necrosis: Yes Has patient had a PCN reaction that required hospitalization: no Has patient had a PCN reaction occurring within the last 10 years: no If all of the above answers are "NO", then may proceed with Cephalosporin use. Patient tolerated rocephin and ertapenem in the past      Medication List    STOP taking these medications   levofloxacin 500 MG tablet Commonly known as:  LEVAQUIN   zolpidem 10 MG tablet Commonly known as:  AMBIEN     TAKE these medications   acetaminophen 500 MG tablet Commonly known as:   TYLENOL Take 500-1,000 mg by mouth every 6 (six) hours as needed (for pain.).   ALPRAZolam 0.5 MG tablet Commonly known as:  XANAX Take 1 tablet by mouth daily as needed for anxiety.   aspirin EC 81 MG tablet Take 81 mg by mouth every morning.   busPIRone 5 MG tablet Commonly known as:  BUSPAR Take 1 tablet (5 mg total) by mouth 2 (two) times daily for 30 days. What changed:    medication strength  how much to take   chlorproMAZINE 10 MG tablet Commonly known as:  THORAZINE Take 1 tablet (10 mg total) by mouth 3 (three) times daily for 30 days. *May take 25mg  three times daily as prescribed* What changed:    medication strength  how much to take  when to take this   doxycycline 100 MG tablet Commonly known as:  VIBRA-TABS Take 1 tablet (100 mg total) by mouth every 12 (twelve) hours for 3 days.   escitalopram 20 MG tablet Commonly known as:  LEXAPRO Take 20 mg by mouth at bedtime.   feeding supplement (NEPRO CARB STEADY) Liqd Take 237 mLs by mouth 3 (three) times daily as needed (Supplement).   fluconazole 150 MG tablet Commonly known as:  DIFLUCAN Take 150 mg by mouth See admin instructions. Prescribed on 04/15/2018-take one tablet by mouth once; repeat in 72 hours if still symptomatic   levothyroxine 50 MCG tablet Commonly  known as:  SYNTHROID Take 50 mcg by mouth daily before breakfast.   lidocaine 5 % ointment Commonly known as:  XYLOCAINE Apply 1 application topically See admin instructions. Apply one to two grams to the affected area(s) two to four times daily   OLANZapine 5 MG tablet Commonly known as:  ZYPREXA Take 1 tablet (5 mg total) by mouth at bedtime for 30 days. What changed:    medication strength  how much to take  when to take this   omeprazole 20 MG capsule Commonly known as:  PRILOSEC Take 20 mg by mouth daily before breakfast.   oxybutynin 10 MG 24 hr tablet Commonly known as:  DITROPAN-XL Take 10 mg by mouth every morning.    povidone-iodine 10 % ointment Commonly known as:  BETADINE Apply 1 application topically See admin instructions. Use as directed   senna-docusate 8.6-50 MG tablet Commonly known as:  Senokot-S Take 1 tablet by mouth 2 (two) times daily. What changed:    when to take this  reasons to take this   sodium bicarbonate 650 MG tablet Take 1 tablet (650 mg total) by mouth 2 (two) times daily.      Follow-up Information    [facility]. Go on 05/13/2018.   Why:  As scheduled for stent removal.  Contact information: [address], [address] [place] [address] [phone]       [facility] Schedule an appointment as soon as possible for a visit on 05/18/2018.   Why:  3:00  office will call for phone visit Contact information: [address] [place] [address] [phone]        Fran Lowes, MD. Schedule an appointment as soon as possible for a visit in 3 weeks.   Specialty:  Nephrology Why:  Establish care for chronic kidney disease  Contact information: [address] [place] [address] [phone]          Allergies  Allergen Reactions  . Macrobid [Nitrofurantoin Monohyd Macro] Hives and Swelling  . Penicillins Swelling and Rash    Has patient had a PCN reaction causing immediate rash, facial/tongue/throat swelling, SOB or lightheadedness with hypotension: Yes Has patient had a PCN reaction causing severe rash involving mucus membranes or skin necrosis: Yes Has patient had a PCN reaction that required hospitalization: no Has patient had a PCN reaction occurring within the last 10 years: no If all of the above answers are "NO", then may proceed with Cephalosporin use. Patient tolerated rocephin and ertapenem in the past   Allergies as of 05/11/2018      Reactions   Macrobid [nitrofurantoin Monohyd Macro] Hives, Swelling   Penicillins Swelling, Rash   Has patient had a PCN reaction causing  immediate rash, facial/tongue/throat swelling, SOB or lightheadedness with hypotension: Yes Has patient had a PCN reaction causing severe rash involving mucus membranes or skin necrosis: Yes Has patient had a PCN reaction that required hospitalization: no Has patient had a PCN reaction occurring within the last 10 years: no If all of the above answers are "NO", then may proceed with Cephalosporin use. Patient tolerated rocephin and ertapenem in the past      Medication List    STOP taking these medications   levofloxacin 500 MG tablet Commonly known as:  LEVAQUIN   zolpidem 10 MG tablet Commonly known as:  AMBIEN     TAKE these medications   acetaminophen 500 MG tablet Commonly known as:  TYLENOL Take  500-1,000 mg by mouth every 6 (six) hours as needed (for pain.).   ALPRAZolam 0.5 MG tablet Commonly known as:  XANAX Take 1 tablet by mouth daily as needed for anxiety.   aspirin EC 81 MG tablet Take 81 mg by mouth every morning.   busPIRone 5 MG tablet Commonly known as:  BUSPAR Take 1 tablet (5 mg total) by mouth 2 (two) times daily for 30 days. What changed:    medication strength  how much to take   chlorproMAZINE 10 MG tablet Commonly known as:  THORAZINE Take 1 tablet (10 mg total) by mouth 3 (three) times daily for 30 days. *May take 25mg  three times daily as prescribed* What changed:    medication strength  how much to take  when to take this   doxycycline 100 MG tablet Commonly known as:  VIBRA-TABS Take 1 tablet (100 mg total) by mouth every 12 (twelve) hours for 3 days.   escitalopram 20 MG tablet Commonly known as:  LEXAPRO Take 20 mg by mouth at bedtime.   feeding supplement (NEPRO CARB STEADY) Liqd Take 237 mLs by mouth 3 (three) times daily as needed (Supplement).   fluconazole 150 MG tablet Commonly known as:  DIFLUCAN Take 150 mg by mouth See admin instructions. Prescribed on 04/15/2018-take one tablet by mouth once; repeat in 72 hours if  still symptomatic   levothyroxine 50 MCG tablet Commonly known as:  SYNTHROID Take 50 mcg by mouth daily before breakfast.   lidocaine 5 % ointment Commonly known as:  XYLOCAINE Apply 1 application topically See admin instructions. Apply one to two grams to the affected area(s) two to four times daily   OLANZapine 5 MG tablet Commonly known as:  ZYPREXA Take 1 tablet (5 mg total) by mouth at bedtime for 30 days. What changed:    medication strength  how much to take  when to take this   omeprazole 20 MG capsule Commonly known as:  PRILOSEC Take 20 mg by mouth daily before breakfast.   oxybutynin 10 MG 24 hr tablet Commonly known as:  DITROPAN-XL Take 10 mg by mouth every morning.   povidone-iodine 10 % ointment Commonly known as:  BETADINE Apply 1 application topically See admin instructions. Use as directed   senna-docusate 8.6-50 MG tablet Commonly known as:  Senokot-S Take 1 tablet by mouth 2 (two) times daily. What changed:    when to take this  reasons to take this   sodium bicarbonate 650 MG tablet Take 1 tablet (650 mg total) by mouth 2 (two) times daily.       Procedures/Studies: Dg Chest 1 View  Result Date: 04/20/2018 CLINICAL DATA:  Altered mental status for 1 week. Nausea and vomiting for 1 day. EXAM: CHEST  1 VIEW COMPARISON:  Chest x-rays dated 01/20/2016 and 01/19/2016 FINDINGS: Power port in place with the tip in the superior vena cava at the level of the carina. Heart size is within normal limits considering the AP portable technique and the shallow inspiration. Pulmonary vascularity is normal. No discrete infiltrates or effusions. IMPRESSION: No acute abnormalities. Electronically Signed   By: Lorriane Shire M.D.   On: 04/20/2018 14:44   Dg Chest 2 View  Result Date: 04/29/2018 CLINICAL DATA:  Shortness of breath. Cerebral palsy. Recent urinary tract infection. EXAM: CHEST - 2 VIEW COMPARISON:  04/20/2018 FINDINGS: Port-A-Cath tip in the SVC  and stable. Lungs are clear. Heart size is enlarged and stable. No acute bone abnormality. Lucency along the left  paraspinal region could represent gas in the esophagus. No large pleural effusions. IMPRESSION: No active cardiopulmonary disease. Stable cardiomegaly. Question distended esophagus. Electronically Signed   By: Markus Daft M.D.   On: 04/29/2018 13:39   Ct Head Wo Contrast  Result Date: 04/20/2018 CLINICAL DATA:  68 year old female with altered mental status. EXAM: CT HEAD WITHOUT CONTRAST TECHNIQUE: Contiguous axial images were obtained from the base of the skull through the vertex without intravenous contrast. COMPARISON:  10/17/2015 CT FINDINGS: Brain: No evidence of acute infarction, hemorrhage, hydrocephalus, extra-axial collection or mass lesion/mass effect. Atrophy, mild chronic small-vessel white matter ischemic changes and bifrontal encephalomalacia again noted. Vascular: Atherosclerotic calcifications noted. Skull: Normal. Negative for fracture or focal lesion. Sinuses/Orbits: No acute finding. Other: None. IMPRESSION: 1. No acute intracranial abnormality. 2. Atrophy, mild chronic small-vessel white matter ischemic changes and bifrontal encephalomalacia. Electronically Signed   By: Margarette Canada M.D.   On: 04/20/2018 15:07   Ct Angio Chest Pe W/cm &/or Wo Cm  Result Date: 04/29/2018 CLINICAL DATA:  Shortness of breath EXAM: CT ANGIOGRAPHY CHEST WITH CONTRAST TECHNIQUE: Multidetector CT imaging of the chest was performed using the standard protocol during bolus administration of intravenous contrast. Multiplanar CT image reconstructions and MIPs were obtained to evaluate the vascular anatomy. CONTRAST:  37mL OMNIPAQUE IOHEXOL 350 MG/ML SOLN COMPARISON:  04/29/2018 chest x-ray FINDINGS: Cardiovascular: Heart is mildly enlarged. Aorta is normal caliber. No filling defects in the pulmonary arteries to suggest pulmonary emboli. Mediastinum/Nodes: No mediastinal, hilar, or axillary adenopathy.  Esophagus has an unremarkable appearance. There is some gas within the upper thoracic esophagus as seen on prior plain film, but this has an unremarkable appearance. Lungs/Pleura: Calcified granuloma in the left upper lobe. No confluent opacities or effusions. Low lung volumes. Upper Abdomen: Gallstones noted within the gallbladder. Musculoskeletal: Chest wall soft tissues are unremarkable. Right chest wall Port-A-Cath in place. No acute bony abnormality. Review of the MIP images confirms the above findings. IMPRESSION: Low lung volumes. Mild cardiomegaly. No acute cardiopulmonary disease. Negative for pulmonary embolus. Cholelithiasis. Electronically Signed   By: Rolm Baptise M.D.   On: 04/29/2018 19:19   US Renal  Result Date: 04/21/2018 CLINICAL DATA:  Acute kidney injury. EXAM: RENAL / URINARY TRACT ULTRASOUND COMPLETE COMPARISON:  Ultrasound of June 21, 2016. FINDINGS: Right Kidney: Renal measurements: 11.9 x 5.8 x 8.1 cm = volume: 297 mL. Moderate hydronephrosis is noted. Possible 14 mm calculus seen in lower pole collecting system. Echogenicity within normal limits. No mass visualized. Left Kidney: Renal measurements: 9.5 x 5.1 x 4.9 cm = volume: 125 mL. Echogenicity within normal limits. No mass or hydronephrosis visualized. Bladder: Not visualized. IMPRESSION: Moderate right hydronephrosis is noted with possible nonobstructive calculus seen in lower pole collecting system of right kidney. Left kidney is unremarkable. Electronically Signed   By: Marijo Conception M.D.   On: 04/21/2018 12:23   Ir Cv Line Injection  Result Date: 05/06/2018 INDICATION: 68 year old female with a right IJ port catheter which was placed by interventional radiology (Dr. Earleen Newport) on 04/10/2016. Portacatheter is not aspirating well in patient presents for catheter injection. EXAM: CENTRAL VENOUS CATHETER MEDICATIONS: None ANESTHESIA/SEDATION: None FLUOROSCOPY TIME:  Fluoroscopy Time: 0 minutes 6 seconds (15 mGy). COMPLICATIONS:  None immediate. PROCEDURE: Informed written consent was obtained from the patient after a thorough discussion of the procedural risks, benefits and alternatives. All questions were addressed. A timeout was performed prior to the initiation of the procedure. The port catheter was sterilely accessed. Initial fluoroscopic imaging demonstrates  a well-positioned right IJ approach single-lumen power injectable catheter. The catheter tip overlies the superior cavoatrial junction. Catheter injection was then performed under digital subtraction angiography. The port reservoir is widely patent as is the catheter tubing. No evidence of catheter fracture, kinking or extravasation. Contrast material exits the catheter tip freely. No significant fibrin sheath is visualized. Following injection, the port catheter was again aspirated. This time, the catheter aspirates freely. There may have been a small clot at the tip of the catheter which was displaced during line injection. The catheter is now functioning well. IMPRESSION: 1. Well-positioned right IJ approach single-lumen power injectable port catheter without evidence of complication. 2. Following contrast injection, the port catheter aspirates and flushes easily. There may have been a small occlusive thrombus at the tip of the catheter which was displaced by the injection procedure. Electronically Signed   By: Jacqulynn Cadet M.D.   On: 05/06/2018 13:12   Dg Chest Port 1 View  Result Date: 05/08/2018 CLINICAL DATA:  68 year old female with vomiting. EXAM: PORTABLE CHEST 1 VIEW COMPARISON:  Or 30 20 and earlier. FINDINGS: Portable AP semi upright view at 0230 hours. Large body habitus. Stable cardiac size and mediastinal contours. Lower lung volumes. New asymmetric patchy and indistinct opacity in the left lung. New linear opacity in the right mid lung which most resembles atelectasis. No pneumothorax or pleural effusion identified. Right chest Port-A-Cath redemonstrated  and accessed. Visualized tracheal air column is within normal limits. No acute osseous abnormality identified. IMPRESSION: Lower lung volumes. New patchy and indistinct opacity in the left lung, suspicious for aspiration in this clinical setting. Right lung atelectasis. Electronically Signed   By: Genevie Ann M.D.   On: 05/08/2018 03:13   Dg Chest Portable 1 View  Result Date: 05/07/2018 CLINICAL DATA:  Altered status, unsteady gait, shortness of breath, diabetes mellitus, hypertension, cerebral palsy, GERD EXAM: PORTABLE CHEST 1 VIEW COMPARISON:  Portable exam 1621 hours compared to 04/29/2018 FINDINGS: RIGHT jugular Port-A-Cath with tip projecting over SVC. Enlargement of cardiac silhouette. Mediastinal contours and pulmonary vascularity normal. Question mild atelectasis at RIGHT base. Remaining lungs grossly clear for technique. No definite pleural effusion or pneumothorax. IMPRESSION: Enlargement of cardiac silhouette. Question mild RIGHT basilar atelectasis. Electronically Signed   By: Lavonia Dana M.D.   On: 05/07/2018 16:35   Dg C-arm 1-60 Min-no Report  Result Date: 04/22/2018 Fluoroscopy was utilized by the requesting physician.  No radiographic interpretation.   Ct Renal Stone Study  Result Date: 04/22/2018 CLINICAL DATA:  Hydronephrosis EXAM: CT ABDOMEN AND PELVIS WITHOUT CONTRAST TECHNIQUE: Multidetector CT imaging of the abdomen and pelvis was performed following the standard protocol without IV contrast. COMPARISON:  01/21/2016 FINDINGS: Lower chest: No acute abnormality. Hepatobiliary: No focal liver abnormality is seen. No gallstones, gallbladder wall thickening, or biliary dilatation. Pancreas: No pancreatic mass or peripancreatic inflammatory changes. Fatty atrophy of the pancreas. Spleen: Normal in size without focal abnormality. Adrenals/Urinary Tract: Normal adrenal glands. 9 mm distal right ureteral calculus resulting in severe right hydroureteronephrosis. Bilateral nonobstructing renal  calculi. Atrophic left kidney. Normal bladder. Stomach/Bowel: Stomach is within normal limits. No evidence of bowel wall thickening, distention, or inflammatory changes. Vascular/Lymphatic: Abdominal aortic atherosclerosis. No lymphadenopathy. Reproductive: Uterus and bilateral adnexa are unremarkable. Other: No abdominal wall hernia or abnormality. No abdominopelvic ascites. Musculoskeletal: No acute osseous abnormality. No aggressive osseous lesion. Grade 1 anterolisthesis of L4 on L5 secondary to facet disease. Bilateral foraminal stenosis at L4-5. Degenerative disc disease with disc height loss at L3-4,  L4-5 and L5-S1. IMPRESSION: 1. 9 mm distal right ureteral calculus resulting in severe right hydroureteronephrosis. 2. Bilateral nonobstructing renal calculi. 3. Atrophic left kidney. 4.  Aortic Atherosclerosis (ICD10-I70.0). Electronically Signed   By: Kathreen Devoid   On: 04/22/2018 14:17   Subjective: Pt awake and alert, in no distress.  Cooperative.   Discharge Exam: Vitals:   05/11/18 0550 05/11/18 0742  BP: (!) 103/57   Pulse: 69   Resp: 17   Temp: 98.2 F (36.8 C)   SpO2: 96% 91%   Vitals:   05/10/18 2230 05/11/18 0550 05/11/18 0636 05/11/18 0742  BP: (!) 123/47 (!) 103/57    Pulse: 70 69    Resp: 16 17    Temp: 98.2 F (36.8 C) 98.2 F (36.8 C)    TempSrc: Oral Oral    SpO2: 100% 96%  91%  Weight:   126.4 kg   Height:       General exam: Pt is awake, alert, NAD.  She is very cooperative. Respiratory system: Clear. No increased work of breathing. Cardiovascular system: S1 & S2 heard. No JVD, murmurs, gallops, clicks or pedal edema. Gastrointestinal system: Abdomen is nondistended, soft and nontender. Normal bowel sounds heard. Central nervous system: Alert and oriented. No focal neurological deficits. Extremities: 1+ edema bilateral lower extremities unchanged.   The results of significant diagnostics from this hospitalization (including imaging, microbiology, ancillary and  laboratory) are listed below for reference.     Microbiology: Recent Results (from the past 240 hour(s))  Urine culture     Status: Abnormal   Collection Time: 05/07/18  4:38 PM  Result Value Ref Range Status   Specimen Description   Final    URINE, RANDOM Performed at [facility], [address] [place], [place] [address]    Special Requests   Final    NONE Performed at [facility], [address], [place], [place] [address]    Culture (A)  Final    <10,000 COLONIES/mL INSIGNIFICANT GROWTH Performed at [facility] [address], [place], [place] [address]    Report Status 05/09/2018 FINAL  Final     Labs: BNP (last 3 results) No results for input(s): BNP in the last 8760 hours. Basic Metabolic Panel: Recent Labs  Lab 05/07/18 1612 05/08/18 0640 05/09/18 0454 05/10/18 0606 05/11/18 0530  NA 142 143 142 142 140  K 5.3* 5.2* 5.1 4.9 4.5  CL 110 108 108 110 108  CO2 24 23 25 24 25   GLUCOSE 151* 171* 145* 124* 97  BUN 29* 39* 51* 51* 45*  CREATININE 2.02* 2.40* 2.91* 2.13* 1.78*  CALCIUM 8.8* 8.6* 8.1* 8.1* 8.2*  PHOS  --   --  3.9 3.0 2.4*   Liver Function Tests: Recent Labs  Lab 05/07/18 1612 05/09/18 0454 05/10/18 0606 05/11/18 0530  AST 16  --   --   --   ALT 14  --   --   --   ALKPHOS 79  --   --   --   BILITOT 1.0  --   --   --   PROT 6.5  --   --   --   ALBUMIN 3.5 3.0* 2.7* 2.6*   No results for input(s): LIPASE, AMYLASE in the last 168 hours. No results for input(s): AMMONIA in the last 168 hours. CBC: Recent Labs  Lab 05/07/18 1612 05/08/18 0640 05/09/18 0454 05/10/18 0606 05/11/18 0530  WBC 5.7 19.8* 14.1* 9.5 8.5  NEUTROABS 4.3  --  11.9* 7.3 5.8  HGB 11.6* 12.6 11.0* 10.4* 10.2*  HCT 37.7 40.3 35.7* 34.2* 32.9*  MCV 99.0 98.3 97.5 98.3 96.5  PLT 252 285 237 216 206   Cardiac Enzymes: No results for input(s): CKTOTAL, CKMB, CKMBINDEX, TROPONINI in the last 168 hours. BNP: Invalid input(s): POCBNP CBG: No results for  input(s): GLUCAP in the last 168 hours. D-Dimer No results for input(s): DDIMER in the last 72 hours. Hgb A1c No results for input(s): HGBA1C in the last 72 hours. Lipid Profile No results for input(s): CHOL, HDL, LDLCALC, TRIG, CHOLHDL, LDLDIRECT in the last 72 hours. Thyroid function studies No results for input(s): TSH, T4TOTAL, T3FREE, THYROIDAB in the last 72 hours.  Invalid input(s): FREET3 Anemia work up No results for input(s): VITAMINB12, FOLATE, FERRITIN, TIBC, IRON, RETICCTPCT in the last 72 hours. Urinalysis    Component Value Date/Time   COLORURINE AMBER (A) 05/07/2018 1638   APPEARANCEUR HAZY (A) 05/07/2018 1638   LABSPEC 1.023 05/07/2018 1638   PHURINE 5.0 05/07/2018 1638   GLUCOSEU NEGATIVE 05/07/2018 1638   HGBUR LARGE (A) 05/07/2018 1638   BILIRUBINUR SMALL (A) 05/07/2018 1638   KETONESUR 5 (A) 05/07/2018 1638   PROTEINUR 30 (A) 05/07/2018 1638   UROBILINOGEN 1.0 02/11/2011 2035   NITRITE NEGATIVE 05/07/2018 1638   LEUKOCYTESUR SMALL (A) 05/07/2018 1638   Sepsis Labs Invalid input(s): PROCALCITONIN,  WBC,  LACTICIDVEN Microbiology Recent Results (from the past 240 hour(s))  Urine culture     Status: Abnormal   Collection Time: 05/07/18  4:38 PM  Result Value Ref Range Status   Specimen Description   Final    URINE, RANDOM Performed at [facility], [address], [place], [place] [address]    Special Requests   Final    NONE Performed at [facility], [address], [place], [place] [address]    Culture (A)  Final    <10,000 COLONIES/mL INSIGNIFICANT GROWTH Performed at [facility], [place] [address], [place], [place] [address]    Report Status 05/09/2018 FINAL  Final   Time coordinating discharge: 36 minutes   SIGNED:  Irwin Brakeman, MD  [facility] 05/11/2018, 11:13 AM How to contact the [facility] Attending or Consulting provider [place] or covering provider during after hours [place], for this patient?  1. Check the care team in [facility]  and look for a) attending/consulting [facility] provider listed and b) the [facility] team listed 2. Log into [email or web address] and use [place]'s universal password to access. If you do not have the password, please contact the hospital operator. 3. Locate the [facility] provider you are looking for under [facility] and page to a number that you can be directly reached. 4. If you still have difficulty reaching the provider, please page the [facility] (Director on Call) for the Hospitalists listed on amion for assistance.

## 2018-05-13 ENCOUNTER — Ambulatory Visit (HOSPITAL_COMMUNITY)
Admission: RE | Admit: 2018-05-13 | Discharge: 2018-05-13 | Disposition: A | Discharge status: Home / Self Care | Payer: Medicare Other | Source: Ambulatory Visit | Attending: Urology | Admitting: Urology

## 2018-05-13 ENCOUNTER — Other Ambulatory Visit (HOSPITAL_COMMUNITY): Payer: Self-pay | Admitting: Urology

## 2018-05-13 ENCOUNTER — Ambulatory Visit (INDEPENDENT_AMBULATORY_CARE_PROVIDER_SITE_OTHER): Payer: Medicare Other | Admitting: Urology

## 2018-05-13 ENCOUNTER — Other Ambulatory Visit: Payer: Self-pay

## 2018-05-13 DIAGNOSIS — N201 Calculus of ureter: Secondary | ICD-10-CM | POA: Diagnosis not present

## 2018-05-13 DIAGNOSIS — J159 Unspecified bacterial pneumonia: Secondary | ICD-10-CM | POA: Diagnosis not present

## 2018-05-15 ENCOUNTER — Other Ambulatory Visit: Payer: Self-pay | Admitting: Urology

## 2018-05-18 ENCOUNTER — Other Ambulatory Visit (HOSPITAL_COMMUNITY): Payer: Self-pay | Admitting: Urology

## 2018-05-18 DIAGNOSIS — J159 Unspecified bacterial pneumonia: Secondary | ICD-10-CM

## 2018-06-01 ENCOUNTER — Emergency Department (HOSPITAL_COMMUNITY): Payer: Medicare Other

## 2018-06-01 ENCOUNTER — Inpatient Hospital Stay (HOSPITAL_COMMUNITY)
Admission: EM | Admit: 2018-06-01 | Discharge: 2018-06-08 | DRG: 291 | Disposition: A | Discharge status: Home / Self Care | Payer: Medicare Other | Attending: Family Medicine | Admitting: Family Medicine

## 2018-06-01 ENCOUNTER — Encounter (HOSPITAL_COMMUNITY): Payer: Self-pay | Admitting: Emergency Medicine

## 2018-06-01 ENCOUNTER — Other Ambulatory Visit: Payer: Self-pay

## 2018-06-01 DIAGNOSIS — N179 Acute kidney failure, unspecified: Secondary | ICD-10-CM | POA: Diagnosis present

## 2018-06-01 DIAGNOSIS — D638 Anemia in other chronic diseases classified elsewhere: Secondary | ICD-10-CM | POA: Diagnosis present

## 2018-06-01 DIAGNOSIS — G808 Other cerebral palsy: Secondary | ICD-10-CM

## 2018-06-01 DIAGNOSIS — E1122 Type 2 diabetes mellitus with diabetic chronic kidney disease: Secondary | ICD-10-CM | POA: Diagnosis present

## 2018-06-01 DIAGNOSIS — Z79899 Other long term (current) drug therapy: Secondary | ICD-10-CM

## 2018-06-01 DIAGNOSIS — F418 Other specified anxiety disorders: Secondary | ICD-10-CM | POA: Diagnosis present

## 2018-06-01 DIAGNOSIS — R0902 Hypoxemia: Secondary | ICD-10-CM

## 2018-06-01 DIAGNOSIS — N183 Chronic kidney disease, stage 3 unspecified: Secondary | ICD-10-CM | POA: Diagnosis present

## 2018-06-01 DIAGNOSIS — Z6841 Body Mass Index (BMI) 40.0 and over, adult: Secondary | ICD-10-CM | POA: Diagnosis not present

## 2018-06-01 DIAGNOSIS — J810 Acute pulmonary edema: Secondary | ICD-10-CM | POA: Diagnosis present

## 2018-06-01 DIAGNOSIS — Z880 Allergy status to penicillin: Secondary | ICD-10-CM | POA: Diagnosis not present

## 2018-06-01 DIAGNOSIS — Z87442 Personal history of urinary calculi: Secondary | ICD-10-CM

## 2018-06-01 DIAGNOSIS — E876 Hypokalemia: Secondary | ICD-10-CM | POA: Diagnosis present

## 2018-06-01 DIAGNOSIS — J9691 Respiratory failure, unspecified with hypoxia: Secondary | ICD-10-CM

## 2018-06-01 DIAGNOSIS — Z1159 Encounter for screening for other viral diseases: Secondary | ICD-10-CM | POA: Diagnosis not present

## 2018-06-01 DIAGNOSIS — G809 Cerebral palsy, unspecified: Secondary | ICD-10-CM | POA: Diagnosis present

## 2018-06-01 DIAGNOSIS — I509 Heart failure, unspecified: Secondary | ICD-10-CM | POA: Diagnosis not present

## 2018-06-01 DIAGNOSIS — K219 Gastro-esophageal reflux disease without esophagitis: Secondary | ICD-10-CM | POA: Diagnosis present

## 2018-06-01 DIAGNOSIS — I5033 Acute on chronic diastolic (congestive) heart failure: Secondary | ICD-10-CM | POA: Diagnosis present

## 2018-06-01 DIAGNOSIS — R778 Other specified abnormalities of plasma proteins: Secondary | ICD-10-CM

## 2018-06-01 DIAGNOSIS — E8770 Fluid overload, unspecified: Secondary | ICD-10-CM

## 2018-06-01 DIAGNOSIS — R0602 Shortness of breath: Secondary | ICD-10-CM

## 2018-06-01 DIAGNOSIS — J9601 Acute respiratory failure with hypoxia: Secondary | ICD-10-CM | POA: Diagnosis present

## 2018-06-01 DIAGNOSIS — Z79890 Hormone replacement therapy (postmenopausal): Secondary | ICD-10-CM

## 2018-06-01 DIAGNOSIS — F79 Unspecified intellectual disabilities: Secondary | ICD-10-CM | POA: Diagnosis present

## 2018-06-01 DIAGNOSIS — I130 Hypertensive heart and chronic kidney disease with heart failure and stage 1 through stage 4 chronic kidney disease, or unspecified chronic kidney disease: Secondary | ICD-10-CM | POA: Diagnosis not present

## 2018-06-01 DIAGNOSIS — Z9981 Dependence on supplemental oxygen: Secondary | ICD-10-CM

## 2018-06-01 DIAGNOSIS — R4189 Other symptoms and signs involving cognitive functions and awareness: Secondary | ICD-10-CM

## 2018-06-01 DIAGNOSIS — Z791 Long term (current) use of non-steroidal anti-inflammatories (NSAID): Secondary | ICD-10-CM | POA: Diagnosis not present

## 2018-06-01 DIAGNOSIS — E039 Hypothyroidism, unspecified: Secondary | ICD-10-CM | POA: Diagnosis present

## 2018-06-01 DIAGNOSIS — I10 Essential (primary) hypertension: Secondary | ICD-10-CM | POA: Diagnosis not present

## 2018-06-01 LAB — CBC
HCT: 28.5 % — ABNORMAL LOW (ref 36.0–46.0)
Hemoglobin: 8.8 g/dL — ABNORMAL LOW (ref 12.0–15.0)
MCH: 29.9 pg (ref 26.0–34.0)
MCHC: 30.9 g/dL (ref 30.0–36.0)
MCV: 96.9 fL (ref 80.0–100.0)
Platelets: 228 10*3/uL (ref 150–400)
RBC: 2.94 MIL/uL — ABNORMAL LOW (ref 3.87–5.11)
RDW: 14.7 % (ref 11.5–15.5)
WBC: 7.5 10*3/uL (ref 4.0–10.5)
nRBC: 0 % (ref 0.0–0.2)

## 2018-06-01 LAB — COMPREHENSIVE METABOLIC PANEL
ALT: 14 U/L (ref 0–44)
AST: 15 U/L (ref 15–41)
Albumin: 2.8 g/dL — ABNORMAL LOW (ref 3.5–5.0)
Alkaline Phosphatase: 77 U/L (ref 38–126)
Anion gap: 12 (ref 5–15)
BUN: 33 mg/dL — ABNORMAL HIGH (ref 8–23)
CO2: 24 mmol/L (ref 22–32)
Calcium: 7.8 mg/dL — ABNORMAL LOW (ref 8.9–10.3)
Chloride: 106 mmol/L (ref 98–111)
Creatinine, Ser: 2.29 mg/dL — ABNORMAL HIGH (ref 0.44–1.00)
GFR calc Af Amer: 25 mL/min — ABNORMAL LOW (ref >60)
GFR calc non Af Amer: 22 mL/min — ABNORMAL LOW (ref >60)
Glucose, Bld: 125 mg/dL — ABNORMAL HIGH (ref 70–99)
Potassium: 3.2 mmol/L — ABNORMAL LOW (ref 3.5–5.1)
Sodium: 142 mmol/L (ref 135–145)
Total Bilirubin: 1 mg/dL (ref 0.3–1.2)
Total Protein: 5.8 g/dL — ABNORMAL LOW (ref 6.5–8.1)

## 2018-06-01 LAB — SARS CORONAVIRUS 2 BY RT PCR (HOSPITAL ORDER, PERFORMED IN ~~LOC~~ HOSPITAL LAB): SARS Coronavirus 2: NEGATIVE

## 2018-06-01 LAB — LACTIC ACID, PLASMA: Lactic Acid, Venous: 0.8 mmol/L (ref 0.5–1.9)

## 2018-06-01 LAB — BRAIN NATRIURETIC PEPTIDE: B Natriuretic Peptide: 47 pg/mL (ref 0.0–100.0)

## 2018-06-01 LAB — TROPONIN I: Troponin I: 0.03 ng/mL (ref <0.03)

## 2018-06-01 LAB — PROCALCITONIN: Procalcitonin: 0.13 ng/mL

## 2018-06-01 MED ORDER — ONDANSETRON HCL 4 MG/2ML IJ SOLN: 4.0000 mg | Freq: Four times a day (QID) | INTRAMUSCULAR | Status: DC | PRN

## 2018-06-01 MED ORDER — POLYETHYLENE GLYCOL 3350 17 G PO PACK
17.0000 g | PACK | Freq: Every day | ORAL | Status: DC | PRN
  Administered 2018-06-05 – 2018-06-07 (×2): 17 g via ORAL
  Filled 2018-06-01 (×2): qty 1

## 2018-06-01 MED ORDER — OXYBUTYNIN CHLORIDE ER 5 MG PO TB24
10.0000 mg | ORAL_TABLET | Freq: Every morning | ORAL | Status: DC
  Administered 2018-06-02 – 2018-06-08 (×7): 10 mg via ORAL
  Filled 2018-06-01 (×5): qty 2
  Filled 2018-06-01: qty 1
  Filled 2018-06-01 (×2): qty 2
  Filled 2018-06-01: qty 1

## 2018-06-01 MED ORDER — ESCITALOPRAM OXALATE 10 MG PO TABS
20.0000 mg | ORAL_TABLET | Freq: Two times a day (BID) | ORAL | Status: DC
  Administered 2018-06-01 – 2018-06-08 (×14): 20 mg via ORAL
  Filled 2018-06-01 (×16): qty 2

## 2018-06-01 MED ORDER — POTASSIUM CHLORIDE CRYS ER 20 MEQ PO TBCR
40.0000 meq | EXTENDED_RELEASE_TABLET | Freq: Once | ORAL | Status: AC
  Administered 2018-06-01: 40 meq via ORAL
  Filled 2018-06-01: qty 2

## 2018-06-01 MED ORDER — ASPIRIN EC 81 MG PO TBEC
81.0000 mg | DELAYED_RELEASE_TABLET | Freq: Every morning | ORAL | Status: DC
  Administered 2018-06-02 – 2018-06-08 (×7): 81 mg via ORAL
  Filled 2018-06-01 (×9): qty 1

## 2018-06-01 MED ORDER — ONDANSETRON HCL 4 MG PO TABS: 4.0000 mg | ORAL_TABLET | Freq: Four times a day (QID) | ORAL | Status: DC | PRN

## 2018-06-01 MED ORDER — PANTOPRAZOLE SODIUM 40 MG PO TBEC
40.0000 mg | DELAYED_RELEASE_TABLET | Freq: Every day | ORAL | Status: DC
  Administered 2018-06-02 – 2018-06-08 (×7): 40 mg via ORAL
  Filled 2018-06-01 (×8): qty 1

## 2018-06-01 MED ORDER — CHLORPROMAZINE HCL 25 MG PO TABS
25.0000 mg | ORAL_TABLET | Freq: Three times a day (TID) | ORAL | Status: DC
  Administered 2018-06-01 – 2018-06-08 (×20): 25 mg via ORAL
  Filled 2018-06-01 (×30): qty 1

## 2018-06-01 MED ORDER — ACETAMINOPHEN 650 MG RE SUPP: 650.0000 mg | Freq: Four times a day (QID) | RECTAL | Status: DC | PRN

## 2018-06-01 MED ORDER — NALOXONE HCL 0.4 MG/ML IJ SOLN
INTRAMUSCULAR | Status: AC
  Filled 2018-06-01: qty 1

## 2018-06-01 MED ORDER — ACETAMINOPHEN 325 MG PO TABS
650.0000 mg | ORAL_TABLET | Freq: Four times a day (QID) | ORAL | Status: DC | PRN
  Administered 2018-06-07: 650 mg via ORAL
  Filled 2018-06-01: qty 2

## 2018-06-01 MED ORDER — SENNOSIDES-DOCUSATE SODIUM 8.6-50 MG PO TABS
2.0000 | ORAL_TABLET | Freq: Every evening | ORAL | Status: DC | PRN
  Filled 2018-06-01: qty 2

## 2018-06-01 MED ORDER — ALPRAZOLAM 1 MG PO TABS
1.0000 mg | ORAL_TABLET | Freq: Two times a day (BID) | ORAL | Status: DC
  Administered 2018-06-01 – 2018-06-08 (×14): 1 mg via ORAL
  Filled 2018-06-01 (×14): qty 1

## 2018-06-01 MED ORDER — ZOLPIDEM TARTRATE 5 MG PO TABS
5.0000 mg | ORAL_TABLET | Freq: Every day | ORAL | Status: DC
  Administered 2018-06-01 – 2018-06-07 (×7): 5 mg via ORAL
  Filled 2018-06-01 (×7): qty 1

## 2018-06-01 MED ORDER — HYDRALAZINE HCL 20 MG/ML IJ SOLN: 10.0000 mg | INTRAMUSCULAR | Status: DC | PRN

## 2018-06-01 MED ORDER — BUSPIRONE HCL 5 MG PO TABS
10.0000 mg | ORAL_TABLET | Freq: Two times a day (BID) | ORAL | Status: DC
  Administered 2018-06-01 – 2018-06-08 (×14): 10 mg via ORAL
  Filled 2018-06-01 (×15): qty 2

## 2018-06-01 MED ORDER — OLANZAPINE 5 MG PO TABS
20.0000 mg | ORAL_TABLET | Freq: Every day | ORAL | Status: DC
  Administered 2018-06-01 – 2018-06-07 (×7): 20 mg via ORAL
  Filled 2018-06-01 (×7): qty 4

## 2018-06-01 MED ORDER — HYDROCODONE-ACETAMINOPHEN 5-325 MG PO TABS: 1.0000 | ORAL_TABLET | ORAL | Status: DC | PRN

## 2018-06-01 MED ORDER — NALOXONE HCL 4 MG/0.1ML NA LIQD
NASAL | Status: AC
  Filled 2018-06-01: qty 4

## 2018-06-01 MED ORDER — SODIUM BICARBONATE 650 MG PO TABS
650.0000 mg | ORAL_TABLET | Freq: Two times a day (BID) | ORAL | Status: DC
  Administered 2018-06-01: 650 mg via ORAL
  Filled 2018-06-01 (×6): qty 1

## 2018-06-01 MED ORDER — LEVOTHYROXINE SODIUM 50 MCG PO TABS
50.0000 ug | ORAL_TABLET | Freq: Every day | ORAL | Status: DC
  Administered 2018-06-02 – 2018-06-08 (×7): 50 ug via ORAL
  Filled 2018-06-01 (×7): qty 1

## 2018-06-01 MED ORDER — FUROSEMIDE 10 MG/ML IJ SOLN
40.0000 mg | Freq: Three times a day (TID) | INTRAMUSCULAR | Status: DC
  Administered 2018-06-01 – 2018-06-02 (×2): 40 mg via INTRAVENOUS
  Filled 2018-06-01 (×2): qty 4

## 2018-06-01 MED ORDER — HEPARIN SODIUM (PORCINE) 5000 UNIT/ML IJ SOLN
5000.0000 [IU] | Freq: Three times a day (TID) | INTRAMUSCULAR | Status: DC
  Administered 2018-06-01 – 2018-06-08 (×20): 5000 [IU] via SUBCUTANEOUS
  Filled 2018-06-01 (×20): qty 1

## 2018-06-01 NOTE — ED Triage Notes (Signed)
Ems states pt's sister called for shortness of breath with increased edema for several days.  Pt was recently started on Lasix.  Initial O2 sat was about 88 and pt is not usually on oxygen.

## 2018-06-01 NOTE — ED Provider Notes (Signed)
[facility] Emergency Department Provider Note MRN:  [id number]  Arrival date & time: 06/01/18     Chief Complaint   Shortness of Breath   History of Present Illness   Rachel Morgan is a 68 y.o. year-old female with a history of cerebral palsy, CKD presenting to the ED with chief complaint of shortness of breath.  Report of increased shortness of breath and leg edema for the past few days.  Has recently been started on Lasix.  I was unable to obtain an accurate HPI, PMH, or ROS due to the patient's cognitive impairment, cerebral palsy.  Review of Systems  Positive for shortness of breath, leg edema.  Patient's Health History    Past Medical History:  Diagnosis Date  . Anxiety   . Calculus of gallbladder   . Cerebral palsy ([place])   . Chronic kidney disease   . Diabetes mellitus   . Gall stones   . GERD (gastroesophageal reflux disease)   . High cholesterol   . History of kidney stones   . Hypertension   . Hypothyroidism   . Kidney stones   . MR (mental retardation)   . Pyelonephritis   . Sepsis [facility])     Past Surgical History:  Procedure Laterality Date  . CYSTOSCOPY W/ URETERAL STENT PLACEMENT Bilateral 09/26/2015   Procedure: CYSTOSCOPY WITH Bilateral  RETROGRADE PYELOGRAM/URETERAL STENT PLACEMENT;  Surgeon: [name], MD;  Location: [facility];  Service: Urology;  Laterality: Bilateral;  . CYSTOSCOPY W/ URETERAL STENT PLACEMENT Right 04/22/2018   Procedure: CYSTOSCOPY WITH RETROGRADE PYELOGRAM/URETERAL STENT PLACEMENT;  Surgeon: Cleon Gustin, MD;  Location: [facility];  Service: Urology;  Laterality: Right;  . CYSTOSCOPY W/ URETERAL STENT REMOVAL Left 11/10/2015   Procedure: CYSTOSCOPY WITH STENT REMOVAL;  Surgeon: Cleon Gustin, MD;  Location: [facility];  Service: Urology;  Laterality: Left;  . CYSTOSCOPY WITH STENT PLACEMENT Right 01/21/2016   Procedure: CYSTOSCOPY WITH STENT PLACEMENT;  Surgeon: Franchot Gallo, MD;  Location: [facility];   Service: Urology;  Laterality: Right;  . CYSTOSCOPY/RETROGRADE/URETEROSCOPY/STONE EXTRACTION WITH BASKET Left 10/31/2015   Procedure: CYSTOSCOPY/ BILATERAL URETEROSCOPY/BILATERAL STONE EXTRACTION/ LEFT STENT PLACEMENT;  Surgeon: Irine Seal, MD;  Location: [facility];  Service: Urology;  Laterality: Left;  . CYSTOSCOPY/URETEROSCOPY/HOLMIUM LASER/STENT PLACEMENT Right 02/27/2016   Procedure: CYSTOSCOPY/RIGHT URETEROSCOPY/HOLMIUM LASER/STENT PLACEMENT/STENT REMOVAL;  Surgeon: Irine Seal, MD;  Location: [facility];  Service: Urology;  Laterality: Right;  . HOLMIUM LASER APPLICATION N/A 30/05/1100   Procedure: HOLMIUM LASER APPLICATION;  Surgeon: Irine Seal, MD;  Location: [facility];  Service: Urology;  Laterality: N/A;  . IR CV LINE INJECTION  05/06/2018  . IR FLUORO GUIDE PORT INSERTION RIGHT  04/10/2016  . IR US GUIDE VASC ACCESS RIGHT  04/10/2016    Family History  Family history unknown: Yes    Social History   Socioeconomic History  . Marital status: Single    Spouse name: Not on file  . Number of children: Not on file  . Years of education: Not on file  . Highest education level: Not on file  Occupational History  . Not on file  Social Needs  . Financial resource strain: Patient refused  . Food insecurity:    Worry: Patient refused    Inability: Patient refused  . Transportation needs:    Medical: Patient refused    Non-medical: Patient refused  Tobacco Use  . Smoking status: Never Smoker  . Smokeless tobacco: Never Used  Substance and Sexual Activity  . Alcohol  use: No  . Drug use: No  . Sexual activity: Never    Birth control/protection: None  Lifestyle  . Physical activity:    Days per week: Patient refused    Minutes per session: Patient refused  . Stress: Not at all  Relationships  . Social connections:    Talks on phone: Patient refused    Gets together: Patient refused    Attends religious service: Patient refused    Active member of club or organization: Patient refused     Attends meetings of clubs or organizations: Patient refused    Relationship status: Patient refused  . Intimate partner violence:    Fear of current or ex partner: Patient refused    Emotionally abused: Patient refused    Physically abused: Patient refused    Forced sexual activity: Patient refused  Other Topics Concern  . Not on file  Social History Narrative  . Not on file     Physical Exam  Vital Signs and Nursing Notes reviewed Vitals:   06/01/18 1730 06/01/18 1800  BP: (!) 118/57 (!) 110/57  Pulse:  69  Resp:  (!) 22  Temp:    SpO2:  100%    CONSTITUTIONAL: Chronically ill-appearing, NAD NEURO: Awake, makes noises but is largely nonverbal, does not follow commands, moving all extremities EYES:  eyes equal and reactive ENT/NECK:  no LAD, no JVD CARDIO: Regular rate, well-perfused, normal S1 and S2 PULM:  CTAB no wheezing or rhonchi GI/GU:  normal bowel sounds, non-distended, non-tender MSK/SPINE:  No gross deformities, 2+ pitting edema to bilateral lower extremities SKIN:  no rash, atraumatic PSYCH:  Appropriate speech and behavior  Diagnostic and Interventional Summary    EKG Interpretation  Date/Time:  Monday Jun 01 2018 17:31:22 EDT Ventricular Rate:  74 PR Interval:    QRS Duration: 69 QT Interval:  395 QTC Calculation: 439 R Axis:   39 Text Interpretation:  Sinus rhythm Atrial premature complexes Low voltage, precordial leads Nonspecific T abnormalities, diffuse leads Confirmed by Gerlene Fee [phone]) on 06/01/2018 5:35:27 PM      Labs Reviewed  CBC - Abnormal; Notable for the following components:      Result Value   RBC 2.94 (*)    Hemoglobin 8.8 (*)    HCT 28.5 (*)    All other components within normal limits  COMPREHENSIVE METABOLIC PANEL - Abnormal; Notable for the following components:   Potassium 3.2 (*)    Glucose, Bld 125 (*)    BUN 33 (*)    Creatinine, Ser 2.29 (*)    Calcium 7.8 (*)    Total Protein 5.8 (*)    Albumin 2.8 (*)    GFR  calc non Af Amer 22 (*)    GFR calc Af Amer 25 (*)    All other components within normal limits  TROPONIN I - Abnormal; Notable for the following components:   Troponin I 0.03 (*)    All other components within normal limits  SARS CORONAVIRUS 2 (HOSPITAL ORDER, [facility])  LACTIC ACID, PLASMA  BRAIN NATRIURETIC PEPTIDE  URINALYSIS, ROUTINE W REFLEX MICROSCOPIC  BASIC METABOLIC PANEL  MAGNESIUM    DG Chest Port 1 View  Final Result    DG Ankle Complete Right    (Results Pending)    Medications  naloxone (NARCAN) 0.4 MG/ML injection (has no administration in time range)  naloxone (NARCAN) 4 MG/0.1ML nasal spray kit (has no administration in time range)  furosemide (LASIX) injection 40  mg (has no administration in time range)  potassium chloride SA (K-DUR) CR tablet 40 mEq (has no administration in time range)  senna-docusate (Senokot-S) tablet 2 tablet (has no administration in time range)  polyethylene glycol (MIRALAX / GLYCOLAX) packet 17 g (has no administration in time range)  hydrALAZINE (APRESOLINE) injection 10 mg (has no administration in time range)     Procedures Critical Care  ED Course and Medical Decision Making  I have reviewed the triage vital signs and the nursing notes.  Pertinent labs & imaging results that were available during my care of the patient were reviewed by me and considered in my medical decision making (see below for details).  Suspect CHF or CKD with fluid overload in this 68 year old female here with shortness of breath and edema.  Patient is hemodynamically stable, requiring 3 L nasal cannula to maintain oxygen saturations in the 90s.  New oxygen requirement, anticipating admission.  Pulmonary edema noted on chest x-ray.  Kidney function is near recent baseline.  Suspect CHF exacerbation despite normal BNP given her obesity.  Admitted to hospital service for further care.  Barth Kirks. [place] Small, [place] [email or web address]  Final Clinical Impressions(s) / ED Diagnoses     ICD-10-CM   1. Hypervolemia, unspecified hypervolemia type E87.70   2. SOB (shortness of breath) R06.02 DG Chest [facility] 1 View    DG Chest [place] 1 View  3. Acute pulmonary edema (HCC) J81.0   4. Requires supplemental oxygen Z99.81   5. Elevated troponin R79.89   6. Cognitive impairment R41.89     ED Discharge Orders    None         Maudie Flakes, MD 06/01/18 [phone]

## 2018-06-01 NOTE — H&P (Signed)
History and Physical    Rachel Morgan:[id number] DOB: 06-13-1950 DOA: 06/01/2018  PCP: [facility] Patient coming from: Home  Chief Complaint: Shortness of breath  HPI: Rachel Morgan is a 68 y.o. female with medical history significant of cerebral palsy, essential hypertension, hypothyroidism, diastolic CHF with ejection fraction 60%, CKD stage III, GERD came to the hospital for evaluation of lower extremity swelling and exertional shortness of breath.  Due to patient's cerebral palsy and unable to carry on a full meaningful conversation, history is provided by the sister over the phone. Sister tells me that for the past several days they have noticed progressive lower extremity swelling.  She was placed on Lasix 20 mg daily by her outpatient PCP about a week ago but despite of taking this she has progressively noticed lower extremity swelling and in the last 2 or 3 days also shortness of breath especially even walking to the bathroom.  Typically patient only goes to the bathroom with the help of a walker but otherwise remains mostly in her bed.  She does have some notable orthopnea as well.  With the symptoms they had called her outpatient provider who advised her to come to the ER.  In the ER she was noted to have bilateral lower extremity swelling.  Creatinine was 2.29 up from baseline of 1.7. CXR showed Pulm Vasc Congestion. Ankle XR- showed swelling.    Review of Systems: As per HPI otherwise 10 point review of systems negative.  Review of Systems Otherwise negative except as per HPI, including: General: Denies fever, chills, night sweats or unintended weight loss. Resp: Denies cough, wheezing, shortness of breath. Cardiac: Denies chest pain, palpitations, orthopnea, paroxysmal nocturnal dyspnea. GI: Denies abdominal pain, nausea, vomiting, diarrhea or constipation GU: Denies dysuria, frequency, hesitancy or incontinence MS: Denies muscle aches, joint  pain or swelling Neuro: Denies headache, neurologic deficits (focal weakness, numbness, tingling), abnormal gait Psych: Denies anxiety, depression, SI/HI/AVH Skin: Denies new rashes or lesions ID: Denies sick contacts, exotic exposures, travel  Past Medical History:  Diagnosis Date  . Anxiety   . Calculus of gallbladder   . Cerebral palsy ([place])   . Chronic kidney disease   . Diabetes mellitus   . Gall stones   . GERD (gastroesophageal reflux disease)   . High cholesterol   . History of kidney stones   . Hypertension   . Hypothyroidism   . Kidney stones   . MR (mental retardation)   . Pyelonephritis   . Sepsis [facility])     Past Surgical History:  Procedure Laterality Date  . CYSTOSCOPY W/ URETERAL STENT PLACEMENT Bilateral 09/26/2015   Procedure: CYSTOSCOPY WITH Bilateral  RETROGRADE PYELOGRAM/URETERAL STENT PLACEMENT;  Surgeon: [name], MD;  Location: [facility];  Service: Urology;  Laterality: Bilateral;  . CYSTOSCOPY W/ URETERAL STENT PLACEMENT Right 04/22/2018   Procedure: CYSTOSCOPY WITH RETROGRADE PYELOGRAM/URETERAL STENT PLACEMENT;  Surgeon: Cleon Gustin, MD;  Location: [facility];  Service: Urology;  Laterality: Right;  . CYSTOSCOPY W/ URETERAL STENT REMOVAL Left 11/10/2015   Procedure: CYSTOSCOPY WITH STENT REMOVAL;  Surgeon: Cleon Gustin, MD;  Location: [facility];  Service: Urology;  Laterality: Left;  . CYSTOSCOPY WITH STENT PLACEMENT Right 01/21/2016   Procedure: CYSTOSCOPY WITH STENT PLACEMENT;  Surgeon: Franchot Gallo, MD;  Location: [facility];  Service: Urology;  Laterality: Right;  . CYSTOSCOPY/RETROGRADE/URETEROSCOPY/STONE EXTRACTION WITH BASKET Left 10/31/2015   Procedure: CYSTOSCOPY/ BILATERAL URETEROSCOPY/BILATERAL STONE EXTRACTION/ LEFT STENT PLACEMENT;  Surgeon: Irine Seal,  MD;  Location: [facility];  Service: Urology;  Laterality: Left;  . CYSTOSCOPY/URETEROSCOPY/HOLMIUM LASER/STENT PLACEMENT Right 02/27/2016   Procedure: CYSTOSCOPY/RIGHT URETEROSCOPY/HOLMIUM  LASER/STENT PLACEMENT/STENT REMOVAL;  Surgeon: Irine Seal, MD;  Location: [facility];  Service: Urology;  Laterality: Right;  . HOLMIUM LASER APPLICATION N/A 07/03/9483   Procedure: HOLMIUM LASER APPLICATION;  Surgeon: Irine Seal, MD;  Location: [facility];  Service: Urology;  Laterality: N/A;  . IR CV LINE INJECTION  05/06/2018  . IR FLUORO GUIDE PORT INSERTION RIGHT  04/10/2016  . IR US GUIDE VASC ACCESS RIGHT  04/10/2016    SOCIAL HISTORY:  reports that she has never smoked. She has never used smokeless tobacco. She reports that she does not drink alcohol or use drugs.  Allergies  Allergen Reactions  . Macrobid [Nitrofurantoin Monohyd Macro] Hives and Swelling  . Penicillins Swelling and Rash    Has patient had a PCN reaction causing immediate rash, facial/tongue/throat swelling, SOB or lightheadedness with hypotension: Yes Has patient had a PCN reaction causing severe rash involving mucus membranes or skin necrosis: Yes Has patient had a PCN reaction that required hospitalization: no Has patient had a PCN reaction occurring within the last 10 years: no If all of the above answers are "NO", then may proceed with Cephalosporin use. Patient tolerated rocephin and ertapenem in the past    FAMILY HISTORY: Unable to obtain from the patient due to her mentation Family History  Family history unknown: Yes     Prior to Admission medications   Medication Sig Start Date End Date Taking? Authorizing Provider  ALPRAZolam Duanne Moron) 1 MG tablet Take 1 mg by mouth 2 (two) times daily.  04/09/18  Yes [provider]  aspirin EC 81 MG tablet Take 81 mg by mouth every morning.    Yes [provider]  busPIRone (BUSPAR) 10 MG tablet Take 10 mg by mouth 2 (two) times daily.   Yes [provider]  chlorproMAZINE (THORAZINE) 25 MG tablet Take 25 mg by mouth 3 (three) times daily.    Yes [provider]  escitalopram (LEXAPRO) 20 MG tablet Take 20 mg by mouth 2 (two) times a day.     Yes [provider]  furosemide (LASIX) 20 MG tablet Take 20 mg by mouth daily. 05/25/18 06/15/18 Yes [provider]  ibuprofen (ADVIL) 200 MG tablet Take 200 mg by mouth every 6 (six) hours as needed for mild pain or moderate pain.   Yes [provider]  levothyroxine (SYNTHROID, LEVOTHROID) 50 MCG tablet Take 50 mcg by mouth daily before breakfast.   Yes [provider]  OLANZapine (ZYPREXA) 20 MG tablet Take 20 mg by mouth at bedtime.   Yes [provider]  omeprazole (PRILOSEC) 20 MG capsule Take 20 mg by mouth daily before breakfast.    Yes [provider]  oxybutynin (DITROPAN-XL) 10 MG 24 hr tablet Take 10 mg by mouth every morning.    Yes [provider]  senna-docusate (SENOKOT-S) 8.6-50 MG tablet Take 1 tablet by mouth 2 (two) times daily. Patient taking differently: Take 1 tablet by mouth 2 (two) times daily as needed.  01/26/16  Yes Sheikh, Omair Latif, DO  sodium bicarbonate 650 MG tablet Take 1 tablet (650 mg total) by mouth 2 (two) times daily. 04/25/18  Yes Kathie Dike, MD  zolpidem (AMBIEN) 10 MG tablet Take 10 mg by mouth at bedtime.   Yes [provider]    Physical Exam: Vitals:   06/01/18 1730 06/01/18 1800  06/01/18 1900 06/01/18 2005  BP: (!) 118/57 (!) 110/57 (!) 100/55   Pulse:  69 62   Resp:  (!) 22 20   Temp:      TempSrc:      SpO2:  100% 100% 99%  Weight:      Height:          Constitutional: NAD, calm, comfortable, unable to carry on a conversation.  3 L nasal cannula Eyes: PERRL, lids and conjunctivae normal ENMT: Mucous membranes are dry posterior pharynx clear of any exudate or lesions.Normal dentition.  Neck: normal, supple, no masses, no thyromegaly Respiratory: Diminished breath sounds bilaterally Cardiovascular: Regular rate and rhythm, no murmurs / rubs / gallops.  3+ bilateral lower extremity pitting edema. 2+ pedal pulses. No carotid bruits.  Abdomen: no tenderness, no  masses palpated. No hepatosplenomegaly. Bowel sounds positive.  Musculoskeletal: Difficulty raising her legs due to bilateral lower extremity swelling. Skin: no rashes, lesions, ulcers. No induration Neurologic: CN 2-12 grossly intact. Sensation intact, DTR normal. Strength 4/5 in all 4.  Psychiatric: Poor judgment, alert to her name.  Unable to carry on a conversation    Labs on Admission: I have personally reviewed following labs and imaging studies  CBC: Recent Labs  Lab 06/01/18 1741  WBC 7.5  HGB 8.8*  HCT 28.5*  MCV 96.9  PLT 875   Basic Metabolic Panel: Recent Labs  Lab 06/01/18 1741  NA 142  K 3.2*  CL 106  CO2 24  GLUCOSE 125*  BUN 33*  CREATININE 2.29*  CALCIUM 7.8*   GFR: Estimated Creatinine Clearance: 29.2 mL/min (A) (by C-G formula based on SCr of 2.29 mg/dL (H)). Liver Function Tests: Recent Labs  Lab 06/01/18 1741  AST 15  ALT 14  ALKPHOS 77  BILITOT 1.0  PROT 5.8*  ALBUMIN 2.8*   No results for input(s): LIPASE, AMYLASE in the last 168 hours. No results for input(s): AMMONIA in the last 168 hours. Coagulation Profile: No results for input(s): INR, PROTIME in the last 168 hours. Cardiac Enzymes: Recent Labs  Lab 06/01/18 1741  TROPONINI 0.03*   BNP (last 3 results) No results for input(s): PROBNP in the last 8760 hours. HbA1C: No results for input(s): HGBA1C in the last 72 hours. CBG: No results for input(s): GLUCAP in the last 168 hours. Lipid Profile: No results for input(s): CHOL, HDL, LDLCALC, TRIG, CHOLHDL, LDLDIRECT in the last 72 hours. Thyroid Function Tests: No results for input(s): TSH, T4TOTAL, FREET4, T3FREE, THYROIDAB in the last 72 hours. Anemia Panel: No results for input(s): VITAMINB12, FOLATE, FERRITIN, TIBC, IRON, RETICCTPCT in the last 72 hours. Urine analysis:    Component Value Date/Time   COLORURINE AMBER (A) 05/07/2018 1638   APPEARANCEUR HAZY (A) 05/07/2018 1638   LABSPEC 1.023 05/07/2018 1638   PHURINE  5.0 05/07/2018 1638   GLUCOSEU NEGATIVE 05/07/2018 1638   HGBUR LARGE (A) 05/07/2018 1638   BILIRUBINUR SMALL (A) 05/07/2018 1638   KETONESUR 5 (A) 05/07/2018 1638   PROTEINUR 30 (A) 05/07/2018 1638   UROBILINOGEN 1.0 02/11/2011 2035   NITRITE NEGATIVE 05/07/2018 1638   LEUKOCYTESUR SMALL (A) 05/07/2018 1638   Sepsis Labs: !!!!!!!!!!!!!!!!!!!!!!!!!!!!!!!!!!!!!!!!!!!! @LABRCNTIP (procalcitonin:4,lacticidven:4) ) Recent Results (from the past 240 hour(s))  SARS Coronavirus 2 (CEPHEID - Performed in [facility] hospital lab), Hosp Order     Status: None   Collection Time: 06/01/18  6:10 PM  Result Value Ref Range Status   SARS Coronavirus 2 NEGATIVE NEGATIVE Final    Comment: (NOTE) If result  is NEGATIVE SARS-CoV-2 target nucleic acids are NOT DETECTED. The SARS-CoV-2 RNA is generally detectable in upper and lower  respiratory specimens during the acute phase of infection. The lowest  concentration of SARS-CoV-2 viral copies this assay can detect is 250  copies / mL. A negative result does not preclude SARS-CoV-2 infection  and should not be used as the sole basis for treatment or other  patient management decisions.  A negative result may occur with  improper specimen collection / handling, submission of specimen other  than nasopharyngeal swab, presence of viral mutation(s) within the  areas targeted by this assay, and inadequate number of viral copies  (<250 copies / mL). A negative result must be combined with clinical  observations, patient history, and epidemiological information. If result is POSITIVE SARS-CoV-2 target nucleic acids are DETECTED. The SARS-CoV-2 RNA is generally detectable in upper and lower  respiratory specimens dur ing the acute phase of infection.  Positive  results are indicative of active infection with SARS-CoV-2.  Clinical  correlation with patient history and other diagnostic information is  necessary to determine patient infection status.  Positive  results do  not rule out bacterial infection or co-infection with other viruses. If result is PRESUMPTIVE POSTIVE SARS-CoV-2 nucleic acids MAY BE PRESENT.   A presumptive positive result was obtained on the submitted specimen  and confirmed on repeat testing.  While 2019 novel coronavirus  (SARS-CoV-2) nucleic acids may be present in the submitted sample  additional confirmatory testing may be necessary for epidemiological  and / or clinical management purposes  to differentiate between  SARS-CoV-2 and other Sarbecovirus currently known to infect humans.  If clinically indicated additional testing with an alternate test  methodology [phone]) is advised. The SARS-CoV-2 RNA is generally  detectable in upper and lower respiratory sp ecimens during the acute  phase of infection. The expected result is Negative. Fact Sheet for Patients:  StrictlyIdeas.no Fact Sheet for Healthcare Providers: BankingDealers.co.za This test is not yet approved or cleared by the [place] FDA and has been authorized for detection and/or diagnosis of SARS-CoV-2 by FDA under an Emergency Use Authorization (EUA).  This EUA will remain in effect (meaning this test can be used) for the duration of the COVID-19 declaration under Section 564(b)(1) of the Act, 21 U.S.C. section 360bbb-3(b)(1), unless the authorization is terminated or revoked sooner. Performed at [facility], [address] [place], [place] [address]      Radiological Exams on Admission: Dg Ankle Complete Right  Result Date: 06/01/2018 CLINICAL DATA:  Pain  and swelling EXAM: RIGHT ANKLE - COMPLETE 3+ VIEW COMPARISON:  Right foot 01/17/2016 FINDINGS: Negative for acute fracture. Ankle joint space normal. No effusion. Small ossicle at the tip of the distal fibula may be due to old injury. Diffuse soft tissue swelling. IMPRESSION: Diffuse soft tissue swelling.  Negative for acute fracture.  Electronically Signed   By: Franchot Gallo M.D.   On: 06/01/2018 19:16   Dg Chest Port 1 View  Result Date: 06/01/2018 CLINICAL DATA:  Shortness of breath. EXAM: PORTABLE CHEST 1 VIEW COMPARISON:  05/13/2018 FINDINGS: 1802 hours. The cardio pericardial silhouette is enlarged. Vascular congestion is associated with pulmonary edema pattern bilaterally. No substantial pleural effusions. The visualized bony structures of the thorax are intact. Right Port-A-Cath again noted. Telemetry leads overlie the chest. IMPRESSION: Cardiomegaly with vascular congestion and pulmonary edema pattern. Electronically Signed   By: Misty Stanley M.D.   On: 06/01/2018 18:27     All images  have been reviewed by me personally.    Assessment/Plan Principal Problem:   Acute exacerbation of CHF (congestive heart failure) (HCC) Active Problems:   Acute renal failure (HCC)   Hypothyroidism   Hypertension   Depression with anxiety   CKD (chronic kidney disease), stage III (HCC)   GERD (gastroesophageal reflux disease)   Cerebral palsy (HCC)   Hypoxia   Acute respiratory distress with hypoxia secondary to CHF exacerbation Acute exacerbation of heart failure with preserved ejection fraction, 60%, class III - Admit the patient to the hospital for IV diuresis.  Lasix 40 mg IV every 8 hours, monitor input and output, daily weight.  Closely monitor electrolytes and replete as necessary - Echocardiogram 05/08/2018-ejection fraction 55-60% -Fluid restriction 1800 cc. -Currently requiring 3 L nasal cannula, wean off oxygen as deemed appropriate.  Acute kidney injury on CKD stage III -Baseline creatinine 1.8, today's 2.29.  Hold off on nephrotoxic drugs except IV diuresis.  Closely monitor renal numbers.  Anemia of chronic disease -Hemoglobin 8.8, baseline 10.0.  No obvious signs of blood loss.  Continue to monitor  History of cerebral palsy - Supportive care, dysphagia 2 diet  Hypothyroidism -Resume home  meds  Hypertension, essential -Resume home meds.  DVT prophylaxis: Subcu heparin Code Status: Full code Family Communication: Spoke with the patient's sister over the phone Disposition Plan: To be determined Consults called: None Admission status: Inpatient admission for IV diuresis   Time Spent: 65 minutes.  >50% of the time was devoted to discussing the patients care, assessment, plan and disposition with other care givers along with counseling the patient about the risks and benefits of treatment.    Ankit Arsenio Loader MD [facility]  If 7PM-7AM, please contact night-coverage [email or web address]  06/01/2018, 8:09 PM

## 2018-06-01 NOTE — ED Notes (Signed)
Date and time results received: 06/01/18 1847 (use smartphrase ".now" to insert current time)  Test: Troponin Critical Value: 0.03 Name of Provider Notified:Dr Bero Orders Received? Or Actions Taken?: NA

## 2018-06-02 ENCOUNTER — Other Ambulatory Visit (HOSPITAL_COMMUNITY): Payer: Medicare Other

## 2018-06-02 LAB — BASIC METABOLIC PANEL
Anion gap: 9 (ref 5–15)
BUN: 31 mg/dL — ABNORMAL HIGH (ref 8–23)
CO2: 28 mmol/L (ref 22–32)
Calcium: 7.7 mg/dL — ABNORMAL LOW (ref 8.9–10.3)
Chloride: 108 mmol/L (ref 98–111)
Creatinine, Ser: 2.1 mg/dL — ABNORMAL HIGH (ref 0.44–1.00)
GFR calc Af Amer: 28 mL/min — ABNORMAL LOW (ref >60)
GFR calc non Af Amer: 24 mL/min — ABNORMAL LOW (ref >60)
Glucose, Bld: 95 mg/dL (ref 70–99)
Potassium: 3.5 mmol/L (ref 3.5–5.1)
Sodium: 145 mmol/L (ref 135–145)

## 2018-06-02 LAB — MAGNESIUM: Magnesium: 1.2 mg/dL — ABNORMAL LOW (ref 1.7–2.4)

## 2018-06-02 LAB — GLUCOSE, CAPILLARY: Glucose-Capillary: 76 mg/dL (ref 70–99)

## 2018-06-02 MED ORDER — FUROSEMIDE 10 MG/ML IJ SOLN
40.0000 mg | Freq: Two times a day (BID) | INTRAMUSCULAR | Status: DC
  Administered 2018-06-02 – 2018-06-06 (×8): 40 mg via INTRAVENOUS
  Filled 2018-06-02 (×8): qty 4

## 2018-06-02 MED ORDER — MAGNESIUM SULFATE 4 GM/100ML IV SOLN
4.0000 g | Freq: Once | INTRAVENOUS | Status: AC
  Administered 2018-06-02: 4 g via INTRAVENOUS
  Filled 2018-06-02: qty 100

## 2018-06-02 NOTE — Progress Notes (Signed)
Patient magnesium level is 1.2. paged MD. MD gave verbal order to give 4g of magnesium IV. Will continue to monitor throughout shift.

## 2018-06-02 NOTE — Progress Notes (Addendum)
PROGRESS NOTE    Rachel Morgan  JME:[id number] DOB: Oct 01, 1950 DOA: 06/01/2018 PCP: [facility]    Brief Narrative:  68 year old female who presented with dyspnea.  She does have significant past medical history for cerebral palsy, hypertension, hypothyroidism, chronic kidney disease stage III, diastolic heart failure and GERD.  She was noted to have progressive lower extremity edema over the last several days, associated with dyspnea and orthopnea.  Her primary care provider prescribed 20 mg of daily furosemide with no improvement of her symptoms.  On her initial physical examination her blood pressure was 118/57, heart rate 69, respiratory rate 22, oxygen saturation 99%.  She had dry mucous membranes, lungs with decreased breath sounds bilaterally, heart S1-S2 present and rhythmic, abdomen soft nontender, 3+ pitting bilateral lower extremity edema.  Sodium 142, potassium 3.2, chloride 106, bicarb 24, glucose 125, BUN 33, creatinine 2.29, procalcitonin 0.13, troponin 0.03, white count 7.5, hemoglobin 8.8, hematocrit 28.5, platelets 228.  SARS COVID-19 negative.  Chest x-ray had right rotation, hypoinflation, increased interstitial markings bilaterally, cardiomegaly, right internal jugular vein Port-A-Cath in place.  EKG 74 bpm, normal axis, normal intervals, no ST segment or T wave changes, positive premature atrial complexes, low voltage.  Patient was admitted to the hospital for working diagnosis of acute decompensated diastolic heart failure, complicated by cardiogenic pulmonary edema and acute kidney injury.    Assessment & Plan:   Principal Problem:   Acute exacerbation of CHF (congestive heart failure) (HCC) Active Problems:   Acute renal failure (HCC)   Hypothyroidism   Hypertension   Depression with anxiety   CKD (chronic kidney disease), stage III (HCC)   GERD (gastroesophageal reflux disease)   Cerebral palsy (HCC)   Hypoxia   1. Acute  decompensation of chronic diastolic heart failure with acute cardiogenic pulmonary edema and acute hypoxic respiratory failure. Documented urine output over last 24 H is 300 ml, physical exam with persistent lower extremity edema, but improved ventilation. Blood pressure systolic 229 mmHg. Will continue diuresis with IV furosemide 40 mg IV q12 H, strict in and out. Challenging clinical volume assessment due to body habitus. Oxymetry is 95% on 3 LPM of supplemental 02 per [place]. Echocardiogram from 05/08/18 (old records personally reviewed) with a preserved LV systolic function EF 55 to 60%, moderate increase wall thickness. RV with mild reduced systolic function.   2. AKI on CKD stage 3 with hypokalemia/ hypomagnesemia. Renal function with serum cr at 2,10 from 2,29, K at 3.5 and serum bicarbonate at 28. Will continue diuresis with furosemide 40 mg IV q12 H. Follow on renal panel in am, avoid hypotension and nephrotoxic medications. DC oral bicarbonate. One dose of Kcl 40 meq today for K at 3,5.   3. HTN. Continue blood pressure monitoring.   4. Hypothyroid. Continue levothyroxine.   5. Cerebral palsy. Continue neuro checks per unit protocol, no confusion or agitation, will consult physical therapy and have patient out of bed tid with meals. Continue alprazolam, olanzapine, escitalopram and buspirone.    DVT prophylaxis: sq heparin  Code Status:  full Family Communication: no family at the bedside  Disposition Plan/ discharge barriers: pending clinical improvement.   Body mass index is 56.15 kg/m. Malnutrition Type:      Malnutrition Characteristics:      Nutrition Interventions:     RN Pressure Injury Documentation:     Consultants:     Procedures:     Antimicrobials:       Subjective: Patient with improved  dyspnea, no chest pain, not yet back to baseline, due to persistent lower extremity edema. No nausea or vomiting.   Objective: Vitals:   06/01/18 2005 06/01/18  2031 06/02/18 0431 06/02/18 0538  BP:  (!) 120/56  (!) 120/101  Pulse:  64  66  Resp:  19  17  Temp:  97.7 F (36.5 C)  98.3 F (36.8 C)  TempSrc:  Oral  Oral  SpO2: 99% 100%  95%  Weight:   126.1 kg   Height:        Intake/Output Summary (Last 24 hours) at 06/02/2018 0950 Last data filed at 06/02/2018 0500 Gross per 24 hour  Intake -  Output 300 ml  Net -300 ml   Filed Weights   06/01/18 1727 06/02/18 0431  Weight: 126.4 kg 126.1 kg    Examination:   General: deconditioned  Neurology: Awake and alert, non focal  E ENT: no pallor, no icterus, oral mucosa moist/ short neck.  Cardiovascular: No JVD. S1-S2 present, rhythmic, no gallops, rubs, or murmurs. ++ non pitting lower extremity edema. Pulmonary: positive breath sounds bilaterally, no wheezing, rhonchi or rales. On anterior auscultation.  Gastrointestinal. Abdomen is protuberant with no organomegaly, non tender, no rebound or guarding Skin. No rashes Musculoskeletal: no joint deformities     Data Reviewed: I have personally reviewed following labs and imaging studies  CBC: Recent Labs  Lab 06/01/18 1741  WBC 7.5  HGB 8.8*  HCT 28.5*  MCV 96.9  PLT 322   Basic Metabolic Panel: Recent Labs  Lab 06/01/18 1741 06/02/18 0536  NA 142 145  K 3.2* 3.5  CL 106 108  CO2 24 28  GLUCOSE 125* 95  BUN 33* 31*  CREATININE 2.29* 2.10*  CALCIUM 7.8* 7.7*  MG  --  1.2*   GFR: Estimated Creatinine Clearance: 31.8 mL/min (A) (by C-G formula based on SCr of 2.1 mg/dL (H)). Liver Function Tests: Recent Labs  Lab 06/01/18 1741  AST 15  ALT 14  ALKPHOS 77  BILITOT 1.0  PROT 5.8*  ALBUMIN 2.8*   No results for input(s): LIPASE, AMYLASE in the last 168 hours. No results for input(s): AMMONIA in the last 168 hours. Coagulation Profile: No results for input(s): INR, PROTIME in the last 168 hours. Cardiac Enzymes: Recent Labs  Lab 06/01/18 1741  TROPONINI 0.03*   BNP (last 3 results) No results for  input(s): PROBNP in the last 8760 hours. HbA1C: No results for input(s): HGBA1C in the last 72 hours. CBG: Recent Labs  Lab 06/02/18 0740  GLUCAP 76   Lipid Profile: No results for input(s): CHOL, HDL, LDLCALC, TRIG, CHOLHDL, LDLDIRECT in the last 72 hours. Thyroid Function Tests: No results for input(s): TSH, T4TOTAL, FREET4, T3FREE, THYROIDAB in the last 72 hours. Anemia Panel: No results for input(s): VITAMINB12, FOLATE, FERRITIN, TIBC, IRON, RETICCTPCT in the last 72 hours.    Radiology Studies: I have reviewed all of the imaging during this hospital visit personally     Scheduled Meds: . ALPRAZolam  1 mg Oral BID  . aspirin EC  81 mg Oral q morning - 10a  . busPIRone  10 mg Oral BID  . chlorproMAZINE  25 mg Oral TID  . escitalopram  20 mg Oral BID  . furosemide  40 mg Intravenous Q8H  . heparin  5,000 Units Subcutaneous Q8H  . levothyroxine  50 mcg Oral QAC breakfast  . OLANZapine  20 mg Oral QHS  . oxybutynin  10 mg Oral q morning -  10a  . pantoprazole  40 mg Oral Daily  . sodium bicarbonate  650 mg Oral BID  . zolpidem  5 mg Oral QHS   Continuous Infusions:   LOS: 1 day         Gerome Apley, MD

## 2018-06-03 DIAGNOSIS — I5033 Acute on chronic diastolic (congestive) heart failure: Secondary | ICD-10-CM

## 2018-06-03 DIAGNOSIS — N179 Acute kidney failure, unspecified: Secondary | ICD-10-CM | POA: Diagnosis present

## 2018-06-03 DIAGNOSIS — N183 Chronic kidney disease, stage 3 unspecified: Secondary | ICD-10-CM | POA: Diagnosis present

## 2018-06-03 DIAGNOSIS — E876 Hypokalemia: Secondary | ICD-10-CM | POA: Diagnosis present

## 2018-06-03 DIAGNOSIS — Z6841 Body Mass Index (BMI) 40.0 and over, adult: Secondary | ICD-10-CM

## 2018-06-03 DIAGNOSIS — J9601 Acute respiratory failure with hypoxia: Secondary | ICD-10-CM

## 2018-06-03 DIAGNOSIS — J810 Acute pulmonary edema: Secondary | ICD-10-CM

## 2018-06-03 LAB — MAGNESIUM: Magnesium: 1.7 mg/dL (ref 1.7–2.4)

## 2018-06-03 LAB — BASIC METABOLIC PANEL
Anion gap: 12 (ref 5–15)
BUN: 27 mg/dL — ABNORMAL HIGH (ref 8–23)
CO2: 28 mmol/L (ref 22–32)
Calcium: 7.9 mg/dL — ABNORMAL LOW (ref 8.9–10.3)
Chloride: 102 mmol/L (ref 98–111)
Creatinine, Ser: 1.91 mg/dL — ABNORMAL HIGH (ref 0.44–1.00)
GFR calc Af Amer: 31 mL/min — ABNORMAL LOW (ref >60)
GFR calc non Af Amer: 27 mL/min — ABNORMAL LOW (ref >60)
Glucose, Bld: 108 mg/dL — ABNORMAL HIGH (ref 70–99)
Potassium: 3.4 mmol/L — ABNORMAL LOW (ref 3.5–5.1)
Sodium: 142 mmol/L (ref 135–145)

## 2018-06-03 LAB — GLUCOSE, CAPILLARY: Glucose-Capillary: 98 mg/dL (ref 70–99)

## 2018-06-03 MED ORDER — POTASSIUM CHLORIDE CRYS ER 20 MEQ PO TBCR
40.0000 meq | EXTENDED_RELEASE_TABLET | Freq: Every day | ORAL | Status: DC
  Administered 2018-06-03 – 2018-06-08 (×6): 40 meq via ORAL
  Filled 2018-06-03 (×6): qty 2

## 2018-06-03 MED ORDER — MAGNESIUM SULFATE IN D5W 1-5 GM/100ML-% IV SOLN
1.0000 g | Freq: Once | INTRAVENOUS | Status: AC
  Administered 2018-06-03: 1 g via INTRAVENOUS
  Filled 2018-06-03: qty 100

## 2018-06-03 NOTE — Pre-Procedure Instructions (Signed)
While looking over patients chart for PAT, I discovered she had been an inpatient at [facility] since Monday 06/01/2018 with CHF. I notified Salita at Dr Noland Fordyce office and she will have him review chart and decide if he will proceed with her surgery on 06/08/2018.

## 2018-06-03 NOTE — Care Management Important Message (Signed)
Important Message  Patient Details  Name: Rachel Morgan MRN: [id number] Date of Birth: 11/17/1950   Medicare Important Message Given:  Yes    Tommy Medal 06/03/2018, 3:31 PM

## 2018-06-03 NOTE — Progress Notes (Signed)
PROGRESS NOTE                                                                                                                                                                                                             Patient Demographics:    Rachel Morgan, is a 68 y.o. female, DOB - [birth year], VHQ:[id number]  Admit date - 06/01/2018   Admitting Physician Ankit Arsenio Loader, MD  Outpatient Primary MD for the patient is [facility]  LOS - 2  Outpatient Specialists:  Chief Complaint  Patient presents with  . Shortness of Breath       Brief Narrative   68 year-old morbidly obese female with cerebral palsy, hypertension, hypothyroidism, chronic kidney disease stage III, diastolic CHF and GERD presented with progressive lower extremity swelling over the last several days associated with orthopnea and dyspnea.  Her PCP prescribed her 20 mg of daily Lasix with no improvement of her symptoms.  Patient found to have acute decompensation of her diastolic CHF with pulmonary edema, with acute on chronic kidney disease.  Sats COVID-19 was tested negative. Admitted for further management.   Subjective:   Patient has cerebral palsy and unable to communicate.  I's and O's not adequately charted.   Assessment  & Plan :    Principal Problem:   Acute on chronic diastolic CHF (congestive heart failure) (HCC) Acute respiratory failure with hypoxia (HCC) Still has leg swellings and diminished bibasilar breath sounds.  Weight appears to be close to her recent discharge few weeks back.  Will need another day of IV diuretic (Lasix 40 mg twice daily). Continue 2 L O2 via nasal cannula.  Recent echo showed preserved LV systolic function with moderately increased wall thickness. Monitor strict I's/O and daily weight.  Wean oxygen as tolerated.  Active Problems: Acute kidney injury superimposed on chronic kidney  disease stage III (baseline creatinine 1.8-2) Suspect cardiorenal.  Improving to baseline with diuresis.  Avoid nephrotoxins.  Hypokalemia/hypomagnesemia Noted multiple PVCs on the monitor.  Replenished.  Recheck in a.m.  Essential hypertension Stable.  Continue home meds  Hypothyroidism Continue Synthroid.  Cerebral palsy Continue home meds including olanzapine, citalopram, risperidone and alprazolam. No agitation or worsened confusion.     Code Status : Full code  Family Communication  : None at bedside  Disposition Plan  :  Home possibly in 24 hours if adequately diuresed.  Barriers For Discharge : Active symptoms  Consults  : None  Procedures  : None  DVT Prophylaxis  : Subcu heparin  Lab Results  Component Value Date   PLT 228 06/01/2018    Antibiotics  :    Anti-infectives (From admission, onward)   None        Objective:   Vitals:   06/02/18 2210 06/03/18 0414 06/03/18 0522 06/03/18 0523  BP: (!) 103/45  (!) 89/43 (!) 113/43  Pulse: 74  64 61  Resp: 20  (!) 24   Temp: 99.2 F (37.3 C)  99 F (37.2 C)   TempSrc: Oral  Oral   SpO2: 96%  95%   Weight:  123.9 kg    Height:        Wt Readings from Last 3 Encounters:  06/03/18 123.9 kg  05/11/18 126.4 kg  04/29/18 130 kg     Intake/Output Summary (Last 24 hours) at 06/03/2018 1041 Last data filed at 06/03/2018 1194 Gross per 24 hour  Intake 600 ml  Output -  Net 600 ml     Physical Exam  Gen: not in distress, fatigued HEENT:moist mucosa, supple neck Chest: Diminished bibasilar breath sound, right-sided Port-A-Cath CVs: Normal S1-S2, no murmurs GI: Soft, nondistended or nontender Musculoskeletal: Warm, 1+ pitting edema bilaterally CNS: Alert and awake, poorly communicative     Data Review:    CBC Recent Labs  Lab 06/01/18 1741  WBC 7.5  HGB 8.8*  HCT 28.5*  PLT 228  MCV 96.9  MCH 29.9  MCHC 30.9  RDW 14.7    Chemistries  Recent Labs  Lab 06/01/18 1741  06/02/18 0536 06/03/18 0615  NA 142 145 142  K 3.2* 3.5 3.4*  CL 106 108 102  CO2 24 28 28   GLUCOSE 125* 95 108*  BUN 33* 31* 27*  CREATININE 2.29* 2.10* 1.91*  CALCIUM 7.8* 7.7* 7.9*  MG  --  1.2* 1.7  AST 15  --   --   ALT 14  --   --   ALKPHOS 77  --   --   BILITOT 1.0  --   --    ------------------------------------------------------------------------------------------------------------------ No results for input(s): CHOL, HDL, LDLCALC, TRIG, CHOLHDL, LDLDIRECT in the last 72 hours.  Lab Results  Component Value Date   HGBA1C 5.3 02/27/2016   ------------------------------------------------------------------------------------------------------------------ No results for input(s): TSH, T4TOTAL, T3FREE, THYROIDAB in the last 72 hours.  Invalid input(s): FREET3 ------------------------------------------------------------------------------------------------------------------ No results for input(s): VITAMINB12, FOLATE, FERRITIN, TIBC, IRON, RETICCTPCT in the last 72 hours.  Coagulation profile No results for input(s): INR, PROTIME in the last 168 hours.  No results for input(s): DDIMER in the last 72 hours.  Cardiac Enzymes Recent Labs  Lab 06/01/18 1741  TROPONINI 0.03*   ------------------------------------------------------------------------------------------------------------------    Component Value Date/Time   BNP 47.0 06/01/2018 1741    Inpatient Medications  Scheduled Meds: . ALPRAZolam  1 mg Oral BID  . aspirin EC  81 mg Oral q morning - 10a  . busPIRone  10 mg Oral BID  . chlorproMAZINE  25 mg Oral TID  . escitalopram  20 mg Oral BID  . furosemide  40 mg Intravenous Q12H  . heparin  5,000 Units Subcutaneous Q8H  . levothyroxine  50 mcg Oral QAC breakfast  . OLANZapine  20 mg Oral QHS  . oxybutynin  10 mg Oral q morning - 10a  . pantoprazole  40 mg Oral Daily  .  potassium chloride  40 mEq Oral Daily  . zolpidem  5 mg Oral QHS   Continuous  Infusions: PRN Meds:.acetaminophen **OR** acetaminophen, hydrALAZINE, HYDROcodone-acetaminophen, ondansetron **OR** ondansetron (ZOFRAN) IV, polyethylene glycol, senna-docusate  Micro Results Recent Results (from the past 240 hour(s))  SARS Coronavirus 2 (CEPHEID - Performed in [place] hospital lab), Hosp Order     Status: None   Collection Time: 06/01/18  6:10 PM  Result Value Ref Range Status   SARS Coronavirus 2 NEGATIVE NEGATIVE Final    Comment: (NOTE) If result is NEGATIVE SARS-CoV-2 target nucleic acids are NOT DETECTED. The SARS-CoV-2 RNA is generally detectable in upper and lower  respiratory specimens during the acute phase of infection. The lowest  concentration of SARS-CoV-2 viral copies this assay can detect is 250  copies / mL. A negative result does not preclude SARS-CoV-2 infection  and should not be used as the sole basis for treatment or other  patient management decisions.  A negative result may occur with  improper specimen collection / handling, submission of specimen other  than nasopharyngeal swab, presence of viral mutation(s) within the  areas targeted by this assay, and inadequate number of viral copies  (<250 copies / mL). A negative result must be combined with clinical  observations, patient history, and epidemiological information. If result is POSITIVE SARS-CoV-2 target nucleic acids are DETECTED. The SARS-CoV-2 RNA is generally detectable in upper and lower  respiratory specimens dur ing the acute phase of infection.  Positive  results are indicative of active infection with SARS-CoV-2.  Clinical  correlation with patient history and other diagnostic information is  necessary to determine patient infection status.  Positive results do  not rule out bacterial infection or co-infection with other viruses. If result is PRESUMPTIVE POSTIVE SARS-CoV-2 nucleic acids MAY BE PRESENT.   A presumptive positive result was obtained on the submitted specimen   and confirmed on repeat testing.  While 2019 novel coronavirus  (SARS-CoV-2) nucleic acids may be present in the submitted sample  additional confirmatory testing may be necessary for epidemiological  and / or clinical management purposes  to differentiate between  SARS-CoV-2 and other Sarbecovirus currently known to infect humans.  If clinically indicated additional testing with an alternate test  methodology [phone]) is advised. The SARS-CoV-2 RNA is generally  detectable in upper and lower respiratory sp ecimens during the acute  phase of infection. The expected result is Negative. Fact Sheet for Patients:  StrictlyIdeas.no Fact Sheet for Healthcare Providers: BankingDealers.co.za This test is not yet approved or cleared by the [place] FDA and has been authorized for detection and/or diagnosis of SARS-CoV-2 by FDA under an Emergency Use Authorization (EUA).  This EUA will remain in effect (meaning this test can be used) for the duration of the COVID-19 declaration under Section 564(b)(1) of the Act, 21 U.S.C. section 360bbb-3(b)(1), unless the authorization is terminated or revoked sooner. Performed at [facility], [address] [place], [place] [address]     Radiology Reports Dg Chest 1 View  Result Date: 05/13/2018 CLINICAL DATA:  Pneumonia. EXAM: CHEST  1 VIEW COMPARISON:  Chest x-ray dated 05/08/2018 FINDINGS: The right-sided Port-A-Cath is unchanged in positioning. The lung volumes are low. The cardiac silhouette is enlarged. There is improved aeration throughout the left upper lobe. Evaluation is significantly limited by patient body habitus. No large pneumothorax or pleural effusion. IMPRESSION: 1. Slight interval improvement in aeration within the left upper lobe. 2. Low lung volumes and patient body habitus limit evaluation.  3. Cardiomegaly. 4. Well-positioned right-sided Port-A-Cath. Electronically Signed   By:  Constance Holster M.D.   On: 05/13/2018 17:19   Dg Ankle Complete Right  Result Date: 06/01/2018 CLINICAL DATA:  Pain  and swelling EXAM: RIGHT ANKLE - COMPLETE 3+ VIEW COMPARISON:  Right foot 01/17/2016 FINDINGS: Negative for acute fracture. Ankle joint space normal. No effusion. Small ossicle at the tip of the distal fibula may be due to old injury. Diffuse soft tissue swelling. IMPRESSION: Diffuse soft tissue swelling.  Negative for acute fracture. Electronically Signed   By: Franchot Gallo M.D.   On: 06/01/2018 19:16   Ir Cv Line Injection  Result Date: 05/06/2018 INDICATION: 68 year old female with a right IJ port catheter which was placed by interventional radiology (Dr. Earleen Newport) on 04/10/2016. Portacatheter is not aspirating well in patient presents for catheter injection. EXAM: CENTRAL VENOUS CATHETER MEDICATIONS: None ANESTHESIA/SEDATION: None FLUOROSCOPY TIME:  Fluoroscopy Time: 0 minutes 6 seconds (15 mGy). COMPLICATIONS: None immediate. PROCEDURE: Informed written consent was obtained from the patient after a thorough discussion of the procedural risks, benefits and alternatives. All questions were addressed. A timeout was performed prior to the initiation of the procedure. The port catheter was sterilely accessed. Initial fluoroscopic imaging demonstrates a well-positioned right IJ approach single-lumen power injectable catheter. The catheter tip overlies the superior cavoatrial junction. Catheter injection was then performed under digital subtraction angiography. The port reservoir is widely patent as is the catheter tubing. No evidence of catheter fracture, kinking or extravasation. Contrast material exits the catheter tip freely. No significant fibrin sheath is visualized. Following injection, the port catheter was again aspirated. This time, the catheter aspirates freely. There may have been a small clot at the tip of the catheter which was displaced during line injection. The catheter is  now functioning well. IMPRESSION: 1. Well-positioned right IJ approach single-lumen power injectable port catheter without evidence of complication. 2. Following contrast injection, the port catheter aspirates and flushes easily. There may have been a small occlusive thrombus at the tip of the catheter which was displaced by the injection procedure. Electronically Signed   By: Jacqulynn Cadet M.D.   On: 05/06/2018 13:12   Dg Chest Port 1 View  Result Date: 06/01/2018 CLINICAL DATA:  Shortness of breath. EXAM: PORTABLE CHEST 1 VIEW COMPARISON:  05/13/2018 FINDINGS: 1802 hours. The cardio pericardial silhouette is enlarged. Vascular congestion is associated with pulmonary edema pattern bilaterally. No substantial pleural effusions. The visualized bony structures of the thorax are intact. Right Port-A-Cath again noted. Telemetry leads overlie the chest. IMPRESSION: Cardiomegaly with vascular congestion and pulmonary edema pattern. Electronically Signed   By: Misty Stanley M.D.   On: 06/01/2018 18:27   Dg Chest Port 1 View  Result Date: 05/08/2018 CLINICAL DATA:  68 year old female with vomiting. EXAM: PORTABLE CHEST 1 VIEW COMPARISON:  Or 30 20 and earlier. FINDINGS: Portable AP semi upright view at 0230 hours. Large body habitus. Stable cardiac size and mediastinal contours. Lower lung volumes. New asymmetric patchy and indistinct opacity in the left lung. New linear opacity in the right mid lung which most resembles atelectasis. No pneumothorax or pleural effusion identified. Right chest Port-A-Cath redemonstrated and accessed. Visualized tracheal air column is within normal limits. No acute osseous abnormality identified. IMPRESSION: Lower lung volumes. New patchy and indistinct opacity in the left lung, suspicious for aspiration in this clinical setting. Right lung atelectasis. Electronically Signed   By: Genevie Ann M.D.   On: 05/08/2018 03:13   Dg Chest Portable 1 View  Result Date: 05/07/2018 CLINICAL  DATA:  Altered status, unsteady gait, shortness of breath, diabetes mellitus, hypertension, cerebral palsy, GERD EXAM: PORTABLE CHEST 1 VIEW COMPARISON:  Portable exam 1621 hours compared to 04/29/2018 FINDINGS: RIGHT jugular Port-A-Cath with tip projecting over SVC. Enlargement of cardiac silhouette. Mediastinal contours and pulmonary vascularity normal. Question mild atelectasis at RIGHT base. Remaining lungs grossly clear for technique. No definite pleural effusion or pneumothorax. IMPRESSION: Enlargement of cardiac silhouette. Question mild RIGHT basilar atelectasis. Electronically Signed   By: Lavonia Dana M.D.   On: 05/07/2018 16:35    Time Spent in minutes 35  Somya Jauregui M.D on 06/03/2018 at 10:41 AM  Between 7am to 7pm - Pager - [phone]  After 7pm go to [email or web address] - password [email or web address]  [facility] -  Office  [phone]

## 2018-06-04 ENCOUNTER — Encounter (HOSPITAL_COMMUNITY)
Admission: RE | Admit: 2018-06-04 | Discharge: 2018-06-04 | Disposition: A | Discharge status: Home / Self Care | Payer: Medicare Other | Source: Ambulatory Visit | Attending: Urology | Admitting: Urology

## 2018-06-04 LAB — BASIC METABOLIC PANEL WITH GFR
Anion gap: 14 (ref 5–15)
BUN: 28 mg/dL — ABNORMAL HIGH (ref 8–23)
CO2: 31 mmol/L (ref 22–32)
Calcium: 8.7 mg/dL — ABNORMAL LOW (ref 8.9–10.3)
Chloride: 100 mmol/L (ref 98–111)
Creatinine, Ser: 1.97 mg/dL — ABNORMAL HIGH (ref 0.44–1.00)
GFR calc Af Amer: 30 mL/min — ABNORMAL LOW
GFR calc non Af Amer: 26 mL/min — ABNORMAL LOW
Glucose, Bld: 121 mg/dL — ABNORMAL HIGH (ref 70–99)
Potassium: 3.6 mmol/L (ref 3.5–5.1)
Sodium: 145 mmol/L (ref 135–145)

## 2018-06-04 LAB — MAGNESIUM: Magnesium: 1.9 mg/dL (ref 1.7–2.4)

## 2018-06-04 LAB — GLUCOSE, CAPILLARY: Glucose-Capillary: 107 mg/dL — ABNORMAL HIGH (ref 70–99)

## 2018-06-04 NOTE — TOC Progression Note (Signed)
Transition of Care [facility]) - Progression Note    Patient Details  Name: AMYLEE LODATO MRN: [id number] Date of Birth: 1950/03/12  Transition of Care [facility]) CM/SW Contact  Ihor Gully, LCSW Phone Number: 06/04/2018, 2:26 PM  Clinical Narrative:    LCSW spoke with sister/guardian, Phillip Heal, and discussed PT evaluation recommendations. Ms. Piechowski does not want SNF. She wants to continue with HH (PT, RN, aide) through [facility] and CAP services at discharge.        Expected Discharge Plan and Services                                                 Social Determinants of Health (SDOH) Interventions    Readmission Risk Interventions Readmission Risk Prevention Plan 06/03/2018 05/11/2018 05/11/2018  Transportation Screening Complete - -  PCP or Specialist Appt within 3-5 Days - - -  HRI or Home Care Consult Complete - -  Social Work Consult for [place] Planning/Counseling Complete - -  Palliative Care Screening Not Applicable - -  Medication Review Press photographer) Complete - -  PCP or Specialist appointment within 3-5 days of discharge - Complete Complete  HRI or Ong - Complete Complete  SW Recovery Care/Counseling Consult - Complete Complete  Palliative Care Screening - Not Applicable Not Applicable  Skilled Nursing Facility - Not Applicable Not Applicable  Some recent data might be hidden

## 2018-06-04 NOTE — Evaluation (Signed)
Physical Therapy Evaluation Patient Details Name: Rachel Morgan MRN: [id number] DOB: 12/31/1950 Today's Date: 06/04/2018   History of Present Illness  Rachel Morgan is a 68 y.o. female with medical history significant of cerebral palsy, essential hypertension, hypothyroidism, diastolic CHF with ejection fraction 60%, CKD stage III, GERD came to the hospital for evaluation of lower extremity swelling and exertional shortness of breath.  Due to patient's cerebral palsy and unable to carry on a full meaningful conversation, history is provided by the sister over the phone.    Clinical Impression  Patient very weak and at severe risk for falls due to BLE weakness resulting in buckling of knees and near loss of balance during transfers.  Patient limited to a few steps at bedside with SpO2 dropping from 94% to 81% while on room air, once sitting and resting SpO2 increased to 94-96%.  Patient tolerated sitting up in chair with her sister in room.  Patient will benefit from continued physical therapy in hospital and recommended venue below to increase strength, balance, endurance for safe ADLs and gait.    Follow Up Recommendations SNF;Supervision/Assistance - 24 hour;Supervision for mobility/OOB    Equipment Recommendations  None recommended by PT    Recommendations for Other Services       Precautions / Restrictions Precautions Precautions: Fall Restrictions Weight Bearing Restrictions: No      Mobility  Bed Mobility Overal bed mobility: Needs Assistance Bed Mobility: Supine to Sit Rolling: Mod assist         General bed mobility comments: increased time, labored movement, required assistance to move BLE off bed  Transfers Overall transfer level: Needs assistance Equipment used: Rolling walker (2 wheeled) Transfers: Sit to/from [facility] Sit to Stand: Min assist;Mod assist Stand pivot transfers: Mod assist       General transfer comment: limited  secondary to BLE weakness and buckling of knees  Ambulation/Gait Ambulation/Gait assistance: Mod assist Gait Distance (Feet): 3 Feet Assistive device: Rolling walker (2 wheeled) Gait Pattern/deviations: Decreased step length - right;Decreased step length - left;Decreased stride length Gait velocity: slow   General Gait Details: limited to 3-4 slow labored steps at bedside due to BLE weakness and buckling of knees  Stairs            Wheelchair Mobility    Modified Rankin (Stroke Patients Only)       Balance Overall balance assessment: Needs assistance Sitting-balance support: Feet supported;No upper extremity supported Sitting balance-Leahy Scale: Fair     Standing balance support: Bilateral upper extremity supported;During functional activity Standing balance-Leahy Scale: Poor Standing balance comment: fair/poor using RW                             Pertinent Vitals/Pain Pain Assessment: No/denies pain    Home Living Family/patient expects to be discharged to:: Private residence Living Arrangements: Other relatives Available Help at Discharge: Family Type of Home: House Home Access: Level entry     Home Layout: One level Home Equipment: Environmental consultant - 2 wheels;Bedside commode Additional Comments: Patient's sister provided information    Prior Function Level of Independence: Needs assistance   Gait / Transfers Assistance Needed: short distanced household ambulation with RW  ADL's / Homemaking Assistance Needed: assisted by family        Hand Dominance   Dominant Hand: Right    Extremity/Trunk Assessment   Upper Extremity Assessment Upper Extremity Assessment: Generalized weakness    Lower Extremity Assessment  Lower Extremity Assessment: Generalized weakness    Cervical / Trunk Assessment Cervical / Trunk Assessment: Normal  Communication   Communication: Expressive difficulties  Cognition Arousal/Alertness: Awake/alert Behavior During  Therapy: WFL for tasks assessed/performed Overall Cognitive Status: History of cognitive impairments - at baseline                                 General Comments: cooperative and follows directions consistently      General Comments      Exercises     Assessment/Plan    PT Assessment Patient needs continued PT services  PT Problem List Decreased strength;Decreased activity tolerance;Decreased mobility;Decreased balance       PT Treatment Interventions Therapeutic exercise;Gait training;Stair training;Functional mobility training;Therapeutic activities;Patient/family education    PT Goals (Current goals can be found in the Care Plan section)  Acute Rehab PT Goals Patient Stated Goal: return home PT Goal Formulation: With patient/family Time For Goal Achievement: 06/18/18 Potential to Achieve Goals: Good    Frequency Min 3X/week   Barriers to discharge        Co-evaluation               AM-PAC PT "6 Clicks" Mobility  Outcome Measure Help needed turning from your back to your side while in a flat bed without using bedrails?: A Lot Help needed moving from lying on your back to sitting on the side of a flat bed without using bedrails?: A Lot Help needed moving to and from a bed to a chair (including a wheelchair)?: A Lot Help needed standing up from a chair using your arms (e.g., wheelchair or bedside chair)?: A Lot Help needed to walk in hospital room?: A Lot Help needed climbing 3-5 steps with a railing? : Total 6 Click Score: 11    End of Session Equipment Utilized During Treatment: Gait belt Activity Tolerance: Patient limited by fatigue;Patient tolerated treatment well Patient left: in chair;with call bell/phone within reach;with family/visitor present Nurse Communication: Mobility status PT Visit Diagnosis: Unsteadiness on feet (R26.81);Other abnormalities of gait and mobility (R26.89);Muscle weakness (generalized) (M62.81)    Time:  2423-5361 PT Time Calculation (min) (ACUTE ONLY): 34 min   Charges:   PT Evaluation $PT Eval Moderate Complexity: 1 Mod PT Treatments $Therapeutic Activity: 23-37 mins        12:42 PM, 06/04/18 Lonell Grandchild, MPT Physical Therapist with [facility] [phone] office [phone] mobile phone

## 2018-06-04 NOTE — Progress Notes (Signed)
Patient Demographics:    Rachel Morgan, is a 68 y.o. female, DOB - 1950-02-07, WUG:[id number]  Admit date - 06/01/2018   Admitting Physician Ankit Arsenio Loader, MD  Outpatient Primary MD for the patient is [facility]  LOS - 3   Chief Complaint  Patient presents with  . Shortness of Breath        Subjective:    Rachel Morgan today has no fevers, no emesis,  No chest pain,  O2 sats dropped to 81% with minimum activity with PT, needs additional IV Lasix /diuresis  Assessment  & Plan :    Principal Problem:   Acute on chronic diastolic CHF (congestive heart failure) (HCC) Active Problems:   Acute renal failure (HCC)   Hypothyroidism   Hypertension   Depression with anxiety   CKD (chronic kidney disease), stage III (HCC)   GERD (gastroesophageal reflux disease)   Cerebral palsy (HCC)   Acute exacerbation of CHF (congestive heart failure) (HCC)   Hypoxia   Acute renal failure superimposed on stage 3 chronic kidney disease (HCC)   Acute respiratory failure with hypoxia (HCC)   Hypokalemia   Morbid obesity with BMI of 50.0-59.9, adult ([place])  Brief Summary 68 year-old morbidly obese female with cerebral palsy, hypertension, hypothyroidism, chronic kidney disease stage III, diastolic CHF and GERD presented with progressive lower extremity swelling over the last several days associated with orthopnea and dyspnea.  Her PCP prescribed her 20 mg of daily Lasix with no improvement of her symptoms.  Patient found to have acute decompensation of her diastolic CHF with pulmonary edema, with acute on chronic kidney disease  A/p Acute on chronic diastolic CHF (congestive heart failure) (HCC) Acute respiratory failure with hypoxia - Weight is down 10 pounds since admission, O2 sats dropped to 81% with minimum activity with PT, needs additional IV Lasix /diuresis, will repeat chest  x-ray on 06/05/2018 Continue 2 L O2 via nasal cannula.  Recent echo showed preserved LV systolic function with moderately increased wall thickness. Monitor strict I's/O and daily weight.  Wean oxygen as tolerated.  Active Problems: Acute kidney injury superimposed on chronic kidney disease stage III (baseline creatinine 1.8-2) Suspect cardiorenal.  Improving to baseline with diuresis.  Avoid nephrotoxins.  Hypokalemia/hypomagnesemia Noted multiple PVCs on the monitor.    Recheck and replace  Essential hypertension Stable.  Continue home meds  Hypothyroidism Continue Synthroid.  Cerebral palsy Continue home meds including olanzapine, citalopram, risperidone and alprazolam. No agitation or worsened confusion.  Generalized weakness/debility--- physical therapy eval appreciated, they recommend SNF rehab, patient sister/caregiver is not sure she wants patient to go to SNF at this time due to her cognitive deficits  Code Status : Full code  Family Communication  :  Sister Rachel Morgan at bedside  Disposition Plan  :  SNF rehab versus home with home health  Barriers For Discharge : Active symptoms  procedures  : None  DVT Prophylaxis  : Subcu heparin Disposition/Need for in-Hospital Stay- patient unable to be discharged at this time due to hypoxia and respiratory problems persist needs additional IV diuresis  Lab Results  Component Value Date   PLT 228 06/01/2018    Inpatient Medications  Scheduled Meds: . ALPRAZolam  1 mg Oral BID  .  aspirin EC  81 mg Oral q morning - 10a  . busPIRone  10 mg Oral BID  . chlorproMAZINE  25 mg Oral TID  . escitalopram  20 mg Oral BID  . furosemide  40 mg Intravenous Q12H  . heparin  5,000 Units Subcutaneous Q8H  . levothyroxine  50 mcg Oral QAC breakfast  . OLANZapine  20 mg Oral QHS  . oxybutynin  10 mg Oral q morning - 10a  . pantoprazole  40 mg Oral Daily  . potassium chloride  40 mEq Oral Daily  . zolpidem  5 mg Oral QHS    Continuous Infusions: PRN Meds:.acetaminophen **OR** acetaminophen, hydrALAZINE, HYDROcodone-acetaminophen, ondansetron **OR** ondansetron (ZOFRAN) IV, polyethylene glycol, senna-docusate    Anti-infectives (From admission, onward)   None        Objective:   Vitals:   06/04/18 0506 06/04/18 1200 06/04/18 1210 06/04/18 1429  BP: 111/61   (!) 146/100  Pulse: 70   81  Resp: 20   18  Temp: 98.2 F (36.8 C)   98.9 F (37.2 C)  TempSrc: Oral   Oral  SpO2: 93% (!) 81% 95% 93%  Weight:      Height:        Wt Readings from Last 3 Encounters:  06/04/18 121.8 kg  05/11/18 126.4 kg  04/29/18 130 kg     Intake/Output Summary (Last 24 hours) at 06/04/2018 1815 Last data filed at 06/04/2018 0931 Gross per 24 hour  Intake 360 ml  Output 700 ml  Net -340 ml     Physical Exam Patient is examined daily including today on 06/04/18 , exams remain the same as of yesterday except that has changed   Gen:- Awake Alert,  Obese, no acute distress at rest HEENT:- [place].AT, No sclera icterus Neck-Supple Neck, _+ JVD,.  Lungs-diminished in bases with bibasilar rales  CV- S1, S2 normal, regular  Abd-  +ve B.Sounds, Abd Soft, No tenderness, increased truncal adiposity    Extremity/Skin:-1+ edema, pedal pulses present, Psych-significant cognitive and verbal deficits due to underlying cerebral palsy  neuro-generalized weakness, no new focal deficits, no tremors   Data Review:   Micro Results Recent Results (from the past 240 hour(s))  SARS Coronavirus 2 (CEPHEID - Performed in [place] hospital lab), Hosp Order     Status: None   Collection Time: 06/01/18  6:10 PM  Result Value Ref Range Status   SARS Coronavirus 2 NEGATIVE NEGATIVE Final    Comment: (NOTE) If result is NEGATIVE SARS-CoV-2 target nucleic acids are NOT DETECTED. The SARS-CoV-2 RNA is generally detectable in upper and lower  respiratory specimens during the acute phase of infection. The lowest  concentration of  SARS-CoV-2 viral copies this assay can detect is 250  copies / mL. A negative result does not preclude SARS-CoV-2 infection  and should not be used as the sole basis for treatment or other  patient management decisions.  A negative result may occur with  improper specimen collection / handling, submission of specimen other  than nasopharyngeal swab, presence of viral mutation(s) within the  areas targeted by this assay, and inadequate number of viral copies  (<250 copies / mL). A negative result must be combined with clinical  observations, patient history, and epidemiological information. If result is POSITIVE SARS-CoV-2 target nucleic acids are DETECTED. The SARS-CoV-2 RNA is generally detectable in upper and lower  respiratory specimens dur ing the acute phase of infection.  Positive  results are indicative of active infection with SARS-CoV-2.  Clinical  correlation with patient history and other diagnostic information is  necessary to determine patient infection status.  Positive results do  not rule out bacterial infection or co-infection with other viruses. If result is PRESUMPTIVE POSTIVE SARS-CoV-2 nucleic acids MAY BE PRESENT.   A presumptive positive result was obtained on the submitted specimen  and confirmed on repeat testing.  While 2019 novel coronavirus  (SARS-CoV-2) nucleic acids may be present in the submitted sample  additional confirmatory testing may be necessary for epidemiological  and / or clinical management purposes  to differentiate between  SARS-CoV-2 and other Sarbecovirus currently known to infect humans.  If clinically indicated additional testing with an alternate test  methodology [phone]) is advised. The SARS-CoV-2 RNA is generally  detectable in upper and lower respiratory sp ecimens during the acute  phase of infection. The expected result is Negative. Fact Sheet for Patients:  StrictlyIdeas.no Fact Sheet for Healthcare  Providers: BankingDealers.co.za This test is not yet approved or cleared by the [place] FDA and has been authorized for detection and/or diagnosis of SARS-CoV-2 by FDA under an Emergency Use Authorization (EUA).  This EUA will remain in effect (meaning this test can be used) for the duration of the COVID-19 declaration under Section 564(b)(1) of the Act, 21 U.S.C. section 360bbb-3(b)(1), unless the authorization is terminated or revoked sooner. Performed at [facility], [address], [place], [place] [address]     Radiology Reports Dg Chest 1 View  Result Date: 05/13/2018 CLINICAL DATA:  Pneumonia. EXAM: CHEST  1 VIEW COMPARISON:  Chest x-ray dated 05/08/2018 FINDINGS: The right-sided Port-A-Cath is unchanged in positioning. The lung volumes are low. The cardiac silhouette is enlarged. There is improved aeration throughout the left upper lobe. Evaluation is significantly limited by patient body habitus. No large pneumothorax or pleural effusion. IMPRESSION: 1. Slight interval improvement in aeration within the left upper lobe. 2. Low lung volumes and patient body habitus limit evaluation. 3. Cardiomegaly. 4. Well-positioned right-sided Port-A-Cath. Electronically Signed   By: Constance Holster M.D.   On: 05/13/2018 17:19   Dg Ankle Complete Right  Result Date: 06/01/2018 CLINICAL DATA:  Pain  and swelling EXAM: RIGHT ANKLE - COMPLETE 3+ VIEW COMPARISON:  Right foot 01/17/2016 FINDINGS: Negative for acute fracture. Ankle joint space normal. No effusion. Small ossicle at the tip of the distal fibula may be due to old injury. Diffuse soft tissue swelling. IMPRESSION: Diffuse soft tissue swelling.  Negative for acute fracture. Electronically Signed   By: Franchot Gallo M.D.   On: 06/01/2018 19:16   Ir Cv Line Injection  Result Date: 05/06/2018 INDICATION: 68 year old female with a right IJ port catheter which was placed by interventional radiology (Dr. Earleen Newport) on  04/10/2016. Portacatheter is not aspirating well in patient presents for catheter injection. EXAM: CENTRAL VENOUS CATHETER MEDICATIONS: None ANESTHESIA/SEDATION: None FLUOROSCOPY TIME:  Fluoroscopy Time: 0 minutes 6 seconds (15 mGy). COMPLICATIONS: None immediate. PROCEDURE: Informed written consent was obtained from the patient after a thorough discussion of the procedural risks, benefits and alternatives. All questions were addressed. A timeout was performed prior to the initiation of the procedure. The port catheter was sterilely accessed. Initial fluoroscopic imaging demonstrates a well-positioned right IJ approach single-lumen power injectable catheter. The catheter tip overlies the superior cavoatrial junction. Catheter injection was then performed under digital subtraction angiography. The port reservoir is widely patent as is the catheter tubing. No evidence of catheter fracture, kinking or extravasation. Contrast material exits the catheter tip freely. No significant fibrin  sheath is visualized. Following injection, the port catheter was again aspirated. This time, the catheter aspirates freely. There may have been a small clot at the tip of the catheter which was displaced during line injection. The catheter is now functioning well. IMPRESSION: 1. Well-positioned right IJ approach single-lumen power injectable port catheter without evidence of complication. 2. Following contrast injection, the port catheter aspirates and flushes easily. There may have been a small occlusive thrombus at the tip of the catheter which was displaced by the injection procedure. Electronically Signed   By: Jacqulynn Cadet M.D.   On: 05/06/2018 13:12   Dg Chest Port 1 View  Result Date: 06/01/2018 CLINICAL DATA:  Shortness of breath. EXAM: PORTABLE CHEST 1 VIEW COMPARISON:  05/13/2018 FINDINGS: 1802 hours. The cardio pericardial silhouette is enlarged. Vascular congestion is associated with pulmonary edema pattern  bilaterally. No substantial pleural effusions. The visualized bony structures of the thorax are intact. Right Port-A-Cath again noted. Telemetry leads overlie the chest. IMPRESSION: Cardiomegaly with vascular congestion and pulmonary edema pattern. Electronically Signed   By: Misty Stanley M.D.   On: 06/01/2018 18:27   Dg Chest Port 1 View  Result Date: 05/08/2018 CLINICAL DATA:  68 year old female with vomiting. EXAM: PORTABLE CHEST 1 VIEW COMPARISON:  Or 30 20 and earlier. FINDINGS: Portable AP semi upright view at 0230 hours. Large body habitus. Stable cardiac size and mediastinal contours. Lower lung volumes. New asymmetric patchy and indistinct opacity in the left lung. New linear opacity in the right mid lung which most resembles atelectasis. No pneumothorax or pleural effusion identified. Right chest Port-A-Cath redemonstrated and accessed. Visualized tracheal air column is within normal limits. No acute osseous abnormality identified. IMPRESSION: Lower lung volumes. New patchy and indistinct opacity in the left lung, suspicious for aspiration in this clinical setting. Right lung atelectasis. Electronically Signed   By: Genevie Ann M.D.   On: 05/08/2018 03:13   Dg Chest Portable 1 View  Result Date: 05/07/2018 CLINICAL DATA:  Altered status, unsteady gait, shortness of breath, diabetes mellitus, hypertension, cerebral palsy, GERD EXAM: PORTABLE CHEST 1 VIEW COMPARISON:  Portable exam 1621 hours compared to 04/29/2018 FINDINGS: RIGHT jugular Port-A-Cath with tip projecting over SVC. Enlargement of cardiac silhouette. Mediastinal contours and pulmonary vascularity normal. Question mild atelectasis at RIGHT base. Remaining lungs grossly clear for technique. No definite pleural effusion or pneumothorax. IMPRESSION: Enlargement of cardiac silhouette. Question mild RIGHT basilar atelectasis. Electronically Signed   By: Lavonia Dana M.D.   On: 05/07/2018 16:35     CBC Recent Labs  Lab 06/01/18 1741  WBC 7.5   HGB 8.8*  HCT 28.5*  PLT 228  MCV 96.9  MCH 29.9  MCHC 30.9  RDW 14.7    Chemistries  Recent Labs  Lab 06/01/18 1741 06/02/18 0536 06/03/18 0615 06/04/18 0545  NA 142 145 142 145  K 3.2* 3.5 3.4* 3.6  CL 106 108 102 100  CO2 24 28 28 31   GLUCOSE 125* 95 108* 121*  BUN 33* 31* 27* 28*  CREATININE 2.29* 2.10* 1.91* 1.97*  CALCIUM 7.8* 7.7* 7.9* 8.7*  MG  --  1.2* 1.7 1.9  AST 15  --   --   --   ALT 14  --   --   --   ALKPHOS 77  --   --   --   BILITOT 1.0  --   --   --    ------------------------------------------------------------------------------------------------------------------ No results for input(s): CHOL, HDL, LDLCALC, TRIG, CHOLHDL, LDLDIRECT in  the last 72 hours.  Lab Results  Component Value Date   HGBA1C 5.3 02/27/2016   ------------------------------------------------------------------------------------------------------------------ No results for input(s): TSH, T4TOTAL, T3FREE, THYROIDAB in the last 72 hours.  Invalid input(s): FREET3 ------------------------------------------------------------------------------------------------------------------ No results for input(s): VITAMINB12, FOLATE, FERRITIN, TIBC, IRON, RETICCTPCT in the last 72 hours.  Coagulation profile No results for input(s): INR, PROTIME in the last 168 hours.  No results for input(s): DDIMER in the last 72 hours.  Cardiac Enzymes Recent Labs  Lab 06/01/18 1741  TROPONINI 0.03*   ------------------------------------------------------------------------------------------------------------------    Component Value Date/Time   BNP 47.0 06/01/2018 1741     Cinnamon Morency M.D on 06/04/2018 at 6:15 PM  Go to [email or web address] - for contact info  [facility] - Office  [phone]

## 2018-06-04 NOTE — Plan of Care (Signed)
  Problem: Acute Rehab PT Goals(only PT should resolve) Goal: Pt Will Go Supine/Side To Sit Outcome: Progressing Flowsheets (Taken 06/04/2018 1244) Pt will go Supine/Side to Sit: with minimal assist Goal: Patient Will Transfer Sit To/From Stand Outcome: Progressing Flowsheets (Taken 06/04/2018 1244) Patient will transfer sit to/from stand: with minimal assist Goal: Pt Will Transfer Bed To Chair/Chair To Bed Outcome: Progressing Flowsheets (Taken 06/04/2018 1244) Pt will Transfer Bed to Chair/Chair to Bed: with min assist; with mod assist Goal: Pt Will Ambulate Outcome: Progressing Flowsheets (Taken 06/04/2018 1244) Pt will Ambulate: 10 feet; with minimal assist; with moderate assist; with rolling walker   12:45 PM, 06/04/18 Lonell Grandchild, MPT Physical Therapist with [facility] 336 [phone] office [phone] mobile phone

## 2018-06-04 NOTE — Progress Notes (Signed)
PT tried to walk patient in room and was very weak.,  Her O2 dropped to 81% on room air but came back up to 95% at rest.  Sitting in chair now.  Sister still visiting.

## 2018-06-05 ENCOUNTER — Inpatient Hospital Stay (HOSPITAL_COMMUNITY): Payer: Medicare Other

## 2018-06-05 LAB — MAGNESIUM: Magnesium: 1.7 mg/dL (ref 1.7–2.4)

## 2018-06-05 LAB — BASIC METABOLIC PANEL
Anion gap: 15 (ref 5–15)
BUN: 28 mg/dL — ABNORMAL HIGH (ref 8–23)
CO2: 30 mmol/L (ref 22–32)
Calcium: 8.9 mg/dL (ref 8.9–10.3)
Chloride: 97 mmol/L — ABNORMAL LOW (ref 98–111)
Creatinine, Ser: 2.21 mg/dL — ABNORMAL HIGH (ref 0.44–1.00)
GFR calc Af Amer: 26 mL/min — ABNORMAL LOW (ref >60)
GFR calc non Af Amer: 22 mL/min — ABNORMAL LOW (ref >60)
Glucose, Bld: 122 mg/dL — ABNORMAL HIGH (ref 70–99)
Potassium: 4 mmol/L (ref 3.5–5.1)
Sodium: 142 mmol/L (ref 135–145)

## 2018-06-05 MED ORDER — SENNOSIDES-DOCUSATE SODIUM 8.6-50 MG PO TABS: 2.0000 | ORAL_TABLET | Freq: Every evening | ORAL | Status: DC | PRN

## 2018-06-05 NOTE — TOC Progression Note (Signed)
Transition of Care [facility]) - Progression Note    Patient Details  Name: YUKARI FLAX MRN: [id number] Date of Birth: 1950/08/07  Transition of [facility] CM/SW Contact  Ihor Gully, LCSW Phone Number: 06/05/2018, 4:28 PM  Clinical Narrative:    Horris Latino at [facility] was advised that patient is expected to discharge on 06/06/2018. Orders were faxed to [facility].  Oxygen and lift orders were faxed to [facility]. They were advised that patient will discharge tomorrow.  Ms. Boyson, sister, informed of of expected discharge on 06/06/2018 and to expect the deliveries tomorrow from the [facility].        Expected Discharge Plan and Services                           DME Arranged: Oxygen, Other see comment([place] [facility]) DME Agency: [facility] Date DME Agency Contacted: 06/05/18 Time DME Agency Contacted: [phone] Representative spoke with at DME Agency: [place]: RN, PT, Nurse's Aide           Social Determinants of Health ([place]) Interventions    Readmission Risk Interventions Readmission Risk Prevention Plan 06/03/2018 05/11/2018 05/11/2018  Transportation Screening Complete - -  PCP or Specialist Appt within 3-5 Days - - -  HRI or [place] Complete - -  Social Work Consult for [place] Planning/Counseling Complete - -  Palliative Care Screening Not Applicable - -  Medication Review Press photographer) Complete - -  PCP or Specialist appointment within 3-5 days of discharge - Complete Complete  HRI or [place] - Complete Complete  SW Recovery Care/Counseling Consult - Complete Complete  Palliative Care Screening - Not Applicable Not Applicable  Skilled Nursing Facility - Not Applicable Not Applicable  Some recent data might be hidden

## 2018-06-05 NOTE — Progress Notes (Signed)
Patient Demographics:    Rachel Morgan, is a 68 y.o. female, DOB - [birth year], PQZ:[id number]  Admit date - 06/01/2018   Admitting Physician Ankit Arsenio Loader, MD  Outpatient Primary MD for the patient is [facility]  LOS - 4  Chief Complaint  Patient presents with  . Shortness of Breath        Subjective:    Rachel Morgan today has no fevers, no emesis,  No chest pain,  Sister at bedside, questions answered   Assessment  & Plan :    Principal Problem:   Acute on chronic diastolic CHF (congestive heart failure) (HCC) Active Problems:   Acute renal failure (HCC)   Hypothyroidism   Hypertension   Depression with anxiety   CKD (chronic kidney disease), stage III (HCC)   GERD (gastroesophageal reflux disease)   Cerebral palsy (HCC)   Acute exacerbation of CHF (congestive heart failure) (HCC)   Hypoxia   Acute renal failure superimposed on stage 3 chronic kidney disease (HCC)   Acute respiratory failure with hypoxia (HCC)   Hypokalemia   Morbid obesity with BMI of 50.0-59.9, adult ([place])  Brief Summary 68 year-old morbidly obese female with cerebral palsy, hypertension, hypothyroidism, chronic kidney disease stage III, diastolic CHF and GERD presented with progressive lower extremity swelling over the last several days associated with orthopnea and dyspnea.  Her PCP prescribed her 20 mg of daily Lasix with no improvement of her symptoms.  Patient found to have acute decompensation of her diastolic CHF with pulmonary edema, with acute on chronic kidney disease  A/p Acute on chronic diastolic CHF (congestive heart failure) (HCC) Acute respiratory failure with hypoxia - Weight is down 10 pounds since admission, O2 sats dropped to 81% with minimum activity with PT, needs additional IV Lasix /diuresis,   chest x-ray on 06/05/2018  With mild interstitial pulm edema and new  small Lt pleural effusion Continue 2 L O2 via nasal cannula.  Recent echo showed preserved LV systolic function with moderately increased wall thickness. Monitor strict I's/O and daily weight.  c/n oxygen , hypoxia is persistent , plan for home oxygen, Wt is down 12 lb since admission, fluid Balance is Negative..  c/n IV Lasix  Active Problems: Acute kidney injury superimposed on chronic kidney disease stage III (baseline creatinine 1.8-2) Suspect cardiorenal. Creatinine is 2.2 with diuresis    Avoid nephrotoxins.   Hypokalemia/hypomagnesemia Noted multiple PVCs on the monitor.    Recheck and replace  Essential hypertension Stable.  Continue home meds  Hypothyroidism Continue Synthroid.  Cerebral palsy Continue home meds including olanzapine, citalopram, risperidone and alprazolam. No agitation or worsened confusion.  Generalized weakness/debility--- physical therapy eval appreciated, they recommend SNF rehab, patient sister/caregiver is not sure she wants patient to go to SNF at this time due to her cognitive deficits--- possible d/c home with [facility] PT and RN, aide, oxygen and [employer]  Code Status : Full code  Family Communication  :  Sister [name] at bedside  Disposition Plan  :  SNF rehab versus home with home health  Barriers For Discharge : Active symptoms  Procedures  : None  DVT Prophylaxis  : Subcu heparin  Disposition/Need for in-Hospital Stay- patient unable to be discharged at  this time due to hypoxia and respiratory problems persist needs additional IV diuresis (Iv Lasix).... possible d/c home with [facility] PT and RN, aide, oxygen and [employer]er   Lab Results  Component Value Date   PLT 228 06/01/2018    Inpatient Medications  Scheduled Meds: . ALPRAZolam  1 mg Oral BID  . aspirin EC  81 mg Oral q morning - 10a  . busPIRone  10 mg Oral BID  . chlorproMAZINE  25 mg Oral TID  . escitalopram  20 mg Oral BID  . furosemide  40 mg Intravenous Q12H  .  heparin  5,000 Units Subcutaneous Q8H  . levothyroxine  50 mcg Oral QAC breakfast  . OLANZapine  20 mg Oral QHS  . oxybutynin  10 mg Oral q morning - 10a  . pantoprazole  40 mg Oral Daily  . potassium chloride  40 mEq Oral Daily  . zolpidem  5 mg Oral QHS   Continuous Infusions: PRN Meds:.acetaminophen **OR** acetaminophen, hydrALAZINE, HYDROcodone-acetaminophen, ondansetron **OR** ondansetron (ZOFRAN) IV, polyethylene glycol, senna-docusate   Anti-infectives (From admission, onward)   None       Objective:   Vitals:   06/05/18 0500 06/05/18 0543 06/05/18 1442 06/05/18 1715  BP:  (!) 109/51 (!) 113/49   Pulse:  65 66   Resp:  20 20   Temp:  98.4 F (36.9 C) 98.3 F (36.8 C)   TempSrc:  Oral    SpO2:  98% 98% 100%  Weight: 120.9 kg     Height:        Wt Readings from Last 3 Encounters:  06/05/18 120.9 kg  05/11/18 126.4 kg  04/29/18 130 kg    Intake/Output Summary (Last 24 hours) at 06/05/2018 1742 Last data filed at 06/05/2018 1100 Gross per 24 hour  Intake 240 ml  Output 1000 ml  Net -760 ml   Physical Exam Patient is examined daily including today on 06/05/18 , exams remain the same as of yesterday except that has changed   Gen:- Awake Alert,  Obese, no acute distress at rest HEENT:- [place].AT, No sclera icterus Nose- [place] 2L/min Neck-Supple Neck, _+ JVD,.  Lungs-diminished in bases with bibasilar rales  CV- S1, S2 normal, regular , Rt Subclavian Portha-Cath in situ Abd-  +ve B.Sounds, Abd Soft, No tenderness, increased truncal adiposity    Extremity/Skin:-1+ edema, pedal pulses present, Psych-significant cognitive and verbal deficits due to underlying cerebral palsy  Neuro-generalized weakness, no new focal deficits, no tremors   Data Review:   Micro Results Recent Results (from the past 240 hour(s))  SARS Coronavirus 2 (CEPHEID - Performed in [place] hospital lab), Hosp Order     Status: None   Collection Time: 06/01/18  6:10 PM  Result Value Ref Range  Status   SARS Coronavirus 2 NEGATIVE NEGATIVE Final    Comment: (NOTE) If result is NEGATIVE SARS-CoV-2 target nucleic acids are NOT DETECTED. The SARS-CoV-2 RNA is generally detectable in upper and lower  respiratory specimens during the acute phase of infection. The lowest  concentration of SARS-CoV-2 viral copies this assay can detect is 250  copies / mL. A negative result does not preclude SARS-CoV-2 infection  and should not be used as the sole basis for treatment or other  patient management decisions.  A negative result may occur with  improper specimen collection / handling, submission of specimen other  than nasopharyngeal swab, presence of viral mutation(s) within the  areas targeted by this assay, and inadequate number of viral  copies  (<250 copies / mL). A negative result must be combined with clinical  observations, patient history, and epidemiological information. If result is POSITIVE SARS-CoV-2 target nucleic acids are DETECTED. The SARS-CoV-2 RNA is generally detectable in upper and lower  respiratory specimens dur ing the acute phase of infection.  Positive  results are indicative of active infection with SARS-CoV-2.  Clinical  correlation with patient history and other diagnostic information is  necessary to determine patient infection status.  Positive results do  not rule out bacterial infection or co-infection with other viruses. If result is PRESUMPTIVE POSTIVE SARS-CoV-2 nucleic acids MAY BE PRESENT.   A presumptive positive result was obtained on the submitted specimen  and confirmed on repeat testing.  While 2019 novel coronavirus  (SARS-CoV-2) nucleic acids may be present in the submitted sample  additional confirmatory testing may be necessary for epidemiological  and / or clinical management purposes  to differentiate between  SARS-CoV-2 and other Sarbecovirus currently known to infect humans.  If clinically indicated additional testing with an alternate  test  methodology [phone]) is advised. The SARS-CoV-2 RNA is generally  detectable in upper and lower respiratory sp ecimens during the acute  phase of infection. The expected result is Negative. Fact Sheet for Patients:  StrictlyIdeas.no Fact Sheet for Healthcare Providers: BankingDealers.co.za This test is not yet approved or cleared by the [place] FDA and has been authorized for detection and/or diagnosis of SARS-CoV-2 by FDA under an Emergency Use Authorization (EUA).  This EUA will remain in effect (meaning this test can be used) for the duration of the COVID-19 declaration under Section 564(b)(1) of the Act, 21 U.S.C. section 360bbb-3(b)(1), unless the authorization is terminated or revoked sooner. Performed at [facility], [address], [place], [place] [address]     Radiology Reports Dg Chest 1 View  Result Date: 05/13/2018 CLINICAL DATA:  Pneumonia. EXAM: CHEST  1 VIEW COMPARISON:  Chest x-ray dated 05/08/2018 FINDINGS: The right-sided Port-A-Cath is unchanged in positioning. The lung volumes are low. The cardiac silhouette is enlarged. There is improved aeration throughout the left upper lobe. Evaluation is significantly limited by patient body habitus. No large pneumothorax or pleural effusion. IMPRESSION: 1. Slight interval improvement in aeration within the left upper lobe. 2. Low lung volumes and patient body habitus limit evaluation. 3. Cardiomegaly. 4. Well-positioned right-sided Port-A-Cath. Electronically Signed   By: Constance Holster M.D.   On: 05/13/2018 17:19   Dg Chest 2 View  Result Date: 06/05/2018 CLINICAL DATA:  Shortness of breath. EXAM: CHEST - 2 VIEW COMPARISON:  Chest x-ray dated Jun 01, 2018. FINDINGS: Unchanged right chest wall port catheter. Stable cardiomegaly with pulmonary vascular congestion and mild interstitial edema. Increasing hazy density of the left lung base related to new small pleural  effusion and associated left basilar atelectasis. No pneumothorax. No acute osseous abnormality. IMPRESSION: 1. New small left pleural effusion. Unchanged mild interstitial pulmonary edema. Electronically Signed   By: Titus Dubin M.D.   On: 06/05/2018 09:06   Dg Ankle Complete Right  Result Date: 06/01/2018 CLINICAL DATA:  Pain  and swelling EXAM: RIGHT ANKLE - COMPLETE 3+ VIEW COMPARISON:  Right foot 01/17/2016 FINDINGS: Negative for acute fracture. Ankle joint space normal. No effusion. Small ossicle at the tip of the distal fibula may be due to old injury. Diffuse soft tissue swelling. IMPRESSION: Diffuse soft tissue swelling.  Negative for acute fracture. Electronically Signed   By: Franchot Gallo M.D.   On: 06/01/2018 19:16  Dg Chest Port 1 View  Result Date: 06/01/2018 CLINICAL DATA:  Shortness of breath. EXAM: PORTABLE CHEST 1 VIEW COMPARISON:  05/13/2018 FINDINGS: 1802 hours. The cardio pericardial silhouette is enlarged. Vascular congestion is associated with pulmonary edema pattern bilaterally. No substantial pleural effusions. The visualized bony structures of the thorax are intact. Right Port-A-Cath again noted. Telemetry leads overlie the chest. IMPRESSION: Cardiomegaly with vascular congestion and pulmonary edema pattern. Electronically Signed   By: Misty Stanley M.D.   On: 06/01/2018 18:27   Dg Chest Port 1 View  Result Date: 05/08/2018 CLINICAL DATA:  68 year old female with vomiting. EXAM: PORTABLE CHEST 1 VIEW COMPARISON:  Or 30 20 and earlier. FINDINGS: Portable AP semi upright view at 0230 hours. Large body habitus. Stable cardiac size and mediastinal contours. Lower lung volumes. New asymmetric patchy and indistinct opacity in the left lung. New linear opacity in the right mid lung which most resembles atelectasis. No pneumothorax or pleural effusion identified. Right chest Port-A-Cath redemonstrated and accessed. Visualized tracheal air column is within normal limits. No acute  osseous abnormality identified. IMPRESSION: Lower lung volumes. New patchy and indistinct opacity in the left lung, suspicious for aspiration in this clinical setting. Right lung atelectasis. Electronically Signed   By: Genevie Ann M.D.   On: 05/08/2018 03:13   Dg Chest Portable 1 View  Result Date: 05/07/2018 CLINICAL DATA:  Altered status, unsteady gait, shortness of breath, diabetes mellitus, hypertension, cerebral palsy, GERD EXAM: PORTABLE CHEST 1 VIEW COMPARISON:  Portable exam 1621 hours compared to 04/29/2018 FINDINGS: RIGHT jugular Port-A-Cath with tip projecting over SVC. Enlargement of cardiac silhouette. Mediastinal contours and pulmonary vascularity normal. Question mild atelectasis at RIGHT base. Remaining lungs grossly clear for technique. No definite pleural effusion or pneumothorax. IMPRESSION: Enlargement of cardiac silhouette. Question mild RIGHT basilar atelectasis. Electronically Signed   By: Lavonia Dana M.D.   On: 05/07/2018 16:35    CBC Recent Labs  Lab 06/01/18 1741  WBC 7.5  HGB 8.8*  HCT 28.5*  PLT 228  MCV 96.9  MCH 29.9  MCHC 30.9  RDW 14.7   Chemistries  Recent Labs  Lab 06/01/18 1741 06/02/18 0536 06/03/18 0615 06/04/18 0545 06/05/18 0544  NA 142 145 142 145 142  K 3.2* 3.5 3.4* 3.6 4.0  CL 106 108 102 100 97*  CO2 24 28 28 31 30   GLUCOSE 125* 95 108* 121* 122*  BUN 33* 31* 27* 28* 28*  CREATININE 2.29* 2.10* 1.91* 1.97* 2.21*  CALCIUM 7.8* 7.7* 7.9* 8.7* 8.9  MG  --  1.2* 1.7 1.9 1.7  AST 15  --   --   --   --   ALT 14  --   --   --   --   ALKPHOS 77  --   --   --   --   BILITOT 1.0  --   --   --   --    Lab Results  Component Value Date   HGBA1C 5.3 02/27/2016   Cardiac Enzymes Recent Labs  Lab 06/01/18 1741  TROPONINI 0.03*   ------------------------------------------------------------------------------------------------------------------    Component Value Date/Time   BNP 47.0 06/01/2018 1741   Lenee Franze M.D on 06/05/2018 at  5:42 PM  Go to [email or web address] - for contact info  [facility] - Office  [phone]

## 2018-06-05 NOTE — Progress Notes (Addendum)
  SATURATION QUALIFICATIONS: (This note is used to comply with regulatory documentation for home oxygen)  Patient Saturations on Room Air at Rest = 88%  Patient Saturations on Room Air while Ambulating = 81%  Patient Saturations on 2 Liters of oxygen while Ambulating = 93%    O2 sats down to 81% with minimum ambulation,  With 2 L of oxygen via nasal cannula O2 sat is back to 93 to 95%  Patient needs continuous O2 at 2 L/min continuously via nasal cannula with humidifier, with gaseous portability and conserving device due to hypoxic respiratory failure secondary to congestive heart failure  Roxan Hockey, MD

## 2018-06-05 NOTE — Progress Notes (Signed)
Physical Therapy Treatment Patient Details Name: Rachel Morgan MRN: [id number] DOB: 11-30-1950 Today's Date: 06/05/2018    History of Present Illness Rachel Morgan is a 68 y.o. female with medical history significant of cerebral palsy, essential hypertension, hypothyroidism, diastolic CHF with ejection fraction 60%, CKD stage III, GERD came to the hospital for evaluation of lower extremity swelling and exertional shortness of breath.  Due to patient's cerebral palsy and unable to carry on a full meaningful conversation, history is provided by the sister over the phone.    PT Comments    Pt with minimal speaking though communicated with notes, eye attention and sister in room to assist with communication.  Pt able to follow all commands correctly.  Mod A (+2 for safety standing) with bed mobility, transfer training and short distance with gait training to chair due to LE weakness and knees buckling.  Pt with O2 saturation range from 96-100% with 2L O2A via nasal cannula.  Later in day assisted RN with O2 saturation on room air for safety with with following results.  (Room air: Supine 94%; sitting at 91% and standing at 92-93%.  Pt very loving attempting to kiss and hug therapist at [place].     Follow Up Recommendations  SNF;Supervision/Assistance - 24 hour;Supervision for mobility/OOB     Equipment Recommendations  None recommended by PT    Recommendations for Other Services       Precautions / Restrictions Precautions Precautions: Fall Restrictions Weight Bearing Restrictions: No    Mobility  Bed Mobility Overal bed mobility: Needs Assistance Bed Mobility: Supine to Sit Rolling: Mod assist   Supine to sit: Min assist;Mod assist     General bed mobility comments: increased time, labored movement, required assistance to move BLE off bed  Transfers Overall transfer level: Needs assistance Equipment used: Rolling walker (2 wheeled) Transfers: Sit to/from Stand Sit to  Stand: Min assist;Mod assist         General transfer comment: limited secondary to BLE weakness and buckling of knees  Ambulation/Gait Ambulation/Gait assistance: Mod assist Gait Distance (Feet): 3 Feet Assistive device: Rolling walker (2 wheeled) Gait Pattern/deviations: Decreased step length - right;Decreased step length - left;Decreased stride length Gait velocity: slow   General Gait Details: limited to 3-4 slow labored steps at bedside due to BLE weakness and buckling of knees   Stairs             Wheelchair Mobility    Modified Rankin (Stroke Patients Only)       Balance                                            Cognition Arousal/Alertness: Awake/alert Behavior During Therapy: WFL for tasks assessed/performed Overall Cognitive Status: History of cognitive impairments - at baseline                                 General Comments: cooperative and follows directions consistently      Exercises      General Comments        Pertinent Vitals/Pain      Home Living                      Prior Function            PT Goals (  current goals can now be found in the care plan section)      Frequency    Min 3X/week      PT Plan Current plan remains appropriate    Co-evaluation              AM-PAC PT "6 Clicks" Mobility   Outcome Measure  Help needed turning from your back to your side while in a flat bed without using bedrails?: A Lot Help needed moving from lying on your back to sitting on the side of a flat bed without using bedrails?: A Lot Help needed moving to and from a bed to a chair (including a wheelchair)?: A Lot Help needed standing up from a chair using your arms (e.g., wheelchair or bedside chair)?: A Lot Help needed to walk in hospital room?: A Lot Help needed climbing 3-5 steps with a railing? : Total 6 Click Score: 11    End of Session Equipment Utilized During Treatment:  Gait belt Activity Tolerance: Patient limited by fatigue;Patient tolerated treatment well Patient left: in chair;with call bell/phone within reach;with family/visitor present Nurse Communication: Mobility status PT Visit Diagnosis: Unsteadiness on feet (R26.81);Other abnormalities of gait and mobility (R26.89);Muscle weakness (generalized) (M62.81)     Time: 1010-1040 PT Time Calculation (min) (ACUTE ONLY): 30 min  Charges:  $Therapeutic Activity: 23-37 mins                     [address] LPTA; CBIS [phone]  Aldona Lento 06/05/2018, 5:19 PM

## 2018-06-05 NOTE — Care Management Important Message (Signed)
Important Message  Patient Details  Name: Rachel Morgan MRN: [id number] Date of Birth: Sep 26, 1950   Medicare Important Message Given:  Yes    Tommy Medal 06/05/2018, 2:30 PM

## 2018-06-06 LAB — BASIC METABOLIC PANEL
Anion gap: 13 (ref 5–15)
BUN: 33 mg/dL — ABNORMAL HIGH (ref 8–23)
CO2: 32 mmol/L (ref 22–32)
Calcium: 8.9 mg/dL (ref 8.9–10.3)
Chloride: 94 mmol/L — ABNORMAL LOW (ref 98–111)
Creatinine, Ser: 2.5 mg/dL — ABNORMAL HIGH (ref 0.44–1.00)
GFR calc Af Amer: 22 mL/min — ABNORMAL LOW (ref >60)
GFR calc non Af Amer: 19 mL/min — ABNORMAL LOW (ref >60)
Glucose, Bld: 109 mg/dL — ABNORMAL HIGH (ref 70–99)
Potassium: 4.3 mmol/L (ref 3.5–5.1)
Sodium: 139 mmol/L (ref 135–145)

## 2018-06-06 LAB — MAGNESIUM: Magnesium: 1.6 mg/dL — ABNORMAL LOW (ref 1.7–2.4)

## 2018-06-06 LAB — GLUCOSE, CAPILLARY: Glucose-Capillary: 123 mg/dL — ABNORMAL HIGH (ref 70–99)

## 2018-06-06 MED ORDER — FUROSEMIDE 10 MG/ML IJ SOLN
40.0000 mg | Freq: Two times a day (BID) | INTRAMUSCULAR | Status: DC
  Administered 2018-06-06 – 2018-06-07 (×2): 40 mg via INTRAVENOUS
  Filled 2018-06-06 (×2): qty 4

## 2018-06-06 MED ORDER — FUROSEMIDE 10 MG/ML IJ SOLN: 40.0000 mg | Freq: Every day | INTRAMUSCULAR | Status: DC

## 2018-06-06 MED ORDER — MAGNESIUM SULFATE 2 GM/50ML IV SOLN
2.0000 g | Freq: Once | INTRAVENOUS | Status: AC
  Administered 2018-06-06: 2 g via INTRAVENOUS
  Filled 2018-06-06: qty 50

## 2018-06-06 NOTE — Progress Notes (Signed)
Patient Demographics:    Rachel Morgan, is a 68 y.o. female, DOB - 19-Dec-1950, IPJ:[id number]  Admit date - 06/01/2018   Admitting Physician Ankit Arsenio Loader, MD  Outpatient Primary MD for the patient is [facility]  LOS - 5  Chief Complaint  Patient presents with  . Shortness of Breath        Subjective:    Rachel Morgan today has no fevers, no emesis,  No chest pain,  Sister [name] at bedside, questions answered   Assessment  & Plan :    Principal Problem:   Acute on chronic diastolic CHF (congestive heart failure) (HCC) Active Problems:   Acute renal failure (HCC)   Hypothyroidism   Hypertension   Depression with anxiety   CKD (chronic kidney disease), stage III (HCC)   GERD (gastroesophageal reflux disease)   Cerebral palsy (HCC)   Acute exacerbation of CHF (congestive heart failure) (HCC)   Hypoxia   Acute renal failure superimposed on stage 3 chronic kidney disease (HCC)   Acute respiratory failure with hypoxia (HCC)   Hypokalemia   Morbid obesity with BMI of 50.0-59.9, adult ([place])  Brief Summary 68 year-old morbidly obese female with cerebral palsy, hypertension, hypothyroidism, chronic kidney disease stage III, diastolic CHF and GERD presented with progressive lower extremity swelling over the last several days associated with orthopnea and dyspnea.  Her PCP prescribed her 20 mg of daily Lasix with no improvement of her symptoms.  Patient found to have acute decompensation of her diastolic CHF with pulmonary edema, with acute on chronic kidney disease  A/p Acute on chronic diastolic CHF (congestive heart failure) (HCC) Acute respiratory failure with hypoxia - Weight is down 12 pounds since admission, hypoxia and significant dyspnea on exertion persist,, needs additional IV Lasix /diuresis,   chest x-ray on 06/05/2018  With mild interstitial pulm edema and  new small Lt pleural effusion Continue 2 L O2 via nasal cannula.  Recent echo showed preserved LV systolic function with moderately increased wall thickness. Monitor strict I's/O and daily weight.  c/n oxygen , hypoxia is persistent , plan for home  oxygen, Wt is down 12 lb since admission, fluid Balance is Negative..  c/n IV Lasix Discussed with Dr. Marval Regal from nephrology service as patient's creatinine is trending up, nephrology service is familiar with patient from previous hospitalizations, okay to continue IV Lasix at current dose .  Active Problems: Acute kidney injury superimposed on chronic kidney disease stage III (baseline creatinine 1.8-2) Suspect cardiorenal. Creatinine is 2.5 with diuresis    Avoid nephrotoxins. Discussed with Dr. Marval Regal from nephrology service as patient's creatinine is trending up, nephrology service is familiar with patient from previous hospitalizations, okay to continue IV Lasix at current dose  Hypokalemia/hypomagnesemia Noted multiple PVCs on the monitor.    Magnesium is down to 1.6, replace iv   Essential hypertension Stable.  Continue home meds  Hypothyroidism Continue Synthroid.  Cerebral palsy Continue home meds including olanzapine, citalopram, risperidone and alprazolam. No agitation or worsened confusion.  Generalized weakness/debility--- physical therapy eval appreciated, they recommend SNF rehab, patient sister/caregiver is not sure she wants patient to go to SNF at this time due to her cognitive deficits--- possible d/c home with [facility] PT and RN,  aide, oxygen and [employer]  Code Status : Full code  Family Communication  :  Sister [name] at bedside, sister [name] by Phone  Disposition Plan  :  SNF rehab versus home with home health  Barriers For Discharge : Active symptoms  Procedures  : None  DVT Prophylaxis  : Subcu heparin  Disposition/Need for in-Hospital Stay- patient unable to be discharged at this time due to  hypoxia and respiratory problems persist needs additional IV diuresis (Iv Lasix).... possible d/c home with [facility] PT and RN, aide, oxygen and [employer]   Lab Results  Component Value Date   PLT 228 06/01/2018    Inpatient Medications  Scheduled Meds: . ALPRAZolam  1 mg Oral BID  . aspirin EC  81 mg Oral q morning - 10a  . busPIRone  10 mg Oral BID  . chlorproMAZINE  25 mg Oral TID  . escitalopram  20 mg Oral BID  . furosemide  40 mg Intravenous BID  . heparin  5,000 Units Subcutaneous Q8H  . levothyroxine  50 mcg Oral QAC breakfast  . OLANZapine  20 mg Oral QHS  . oxybutynin  10 mg Oral q morning - 10a  . pantoprazole  40 mg Oral Daily  . potassium chloride  40 mEq Oral Daily  . zolpidem  5 mg Oral QHS   Continuous Infusions: PRN Meds:.acetaminophen **OR** acetaminophen, hydrALAZINE, HYDROcodone-acetaminophen, ondansetron **OR** ondansetron (ZOFRAN) IV, polyethylene glycol, senna-docusate   Anti-infectives (From admission, onward)   None       Objective:   Vitals:   06/05/18 2211 06/06/18 0524 06/06/18 0611 06/06/18 1324  BP: (!) 103/50 (!) 117/50  (!) 105/54  Pulse: 69 70  61  Resp: (!) 24   19  Temp: 98.8 F (37.1 C) 99 F (37.2 C)    TempSrc: Oral Oral    SpO2: 95% (!) 87% 93% 100%  Weight:      Height:        Wt Readings from Last 3 Encounters:  06/05/18 120.9 kg  05/11/18 126.4 kg  04/29/18 130 kg    Intake/Output Summary (Last 24 hours) at 06/06/2018 1533 Last data filed at 06/06/2018 1319 Gross per 24 hour  Intake 690 ml  Output 2200 ml  Net -1510 ml   Physical Exam Patient is examined daily including today on 06/06/18 , exams remain the same as of yesterday except that has changed   Gen:- Awake Alert,  Obese, no acute distress at rest HEENT:- [place].AT, No sclera icterus Nose- [place] 2L/min Neck-Supple Neck, _+ JVD,.  Lungs-diminished in bases with bibasilar rales  CV- S1, S2 normal, regular , Rt Subclavian Portha-Cath in situ Abd-  +ve B.Sounds, Abd  Soft, No tenderness, increased truncal adiposity    Extremity/Skin:-1+ edema, pedal pulses present, Psych-significant cognitive and verbal deficits due to underlying cerebral palsy  Neuro-generalized weakness, no new focal deficits, no tremors   Data Review:   Micro Results Recent Results (from the past 240 hour(s))  SARS Coronavirus 2 (CEPHEID - Performed in [place] hospital lab), Hosp Order     Status: None   Collection Time: 06/01/18  6:10 PM  Result Value Ref Range Status   SARS Coronavirus 2 NEGATIVE NEGATIVE Final    Comment: (NOTE) If result is NEGATIVE SARS-CoV-2 target nucleic acids are NOT DETECTED. The SARS-CoV-2 RNA is generally detectable in upper and lower  respiratory specimens during the acute phase of infection. The lowest  concentration of SARS-CoV-2 viral copies this assay can detect  is 250  copies / mL. A negative result does not preclude SARS-CoV-2 infection  and should not be used as the sole basis for treatment or other  patient management decisions.  A negative result may occur with  improper specimen collection / handling, submission of specimen other  than nasopharyngeal swab, presence of viral mutation(s) within the  areas targeted by this assay, and inadequate number of viral copies  (<250 copies / mL). A negative result must be combined with clinical  observations, patient history, and epidemiological information. If result is POSITIVE SARS-CoV-2 target nucleic acids are DETECTED. The SARS-CoV-2 RNA is generally detectable in upper and lower  respiratory specimens dur ing the acute phase of infection.  Positive  results are indicative of active infection with SARS-CoV-2.  Clinical  correlation with patient history and other diagnostic information is  necessary to determine patient infection status.  Positive results do  not rule out bacterial infection or co-infection with other viruses. If result is PRESUMPTIVE POSTIVE SARS-CoV-2 nucleic acids  MAY BE PRESENT.   A presumptive positive result was obtained on the submitted specimen  and confirmed on repeat testing.  While 2019 novel coronavirus  (SARS-CoV-2) nucleic acids may be present in the submitted sample  additional confirmatory testing may be necessary for epidemiological  and / or clinical management purposes  to differentiate between  SARS-CoV-2 and other Sarbecovirus currently known to infect humans.  If clinically indicated additional testing with an alternate test  methodology [phone]) is advised. The SARS-CoV-2 RNA is generally  detectable in upper and lower respiratory sp ecimens during the acute  phase of infection. The expected result is Negative. Fact Sheet for Patients:  StrictlyIdeas.no Fact Sheet for Healthcare Providers: BankingDealers.co.za This test is not yet approved or cleared by the [place] FDA and has been authorized for detection and/or diagnosis of SARS-CoV-2 by FDA under an Emergency Use Authorization (EUA).  This EUA will remain in effect (meaning this test can be used) for the duration of the COVID-19 declaration under Section 564(b)(1) of the Act, 21 U.S.C. section 360bbb-3(b)(1), unless the authorization is terminated or revoked sooner. Performed at [facility], [address], [place], [place] [address]     Radiology Reports Dg Chest 1 View  Result Date: 05/13/2018 CLINICAL DATA:  Pneumonia. EXAM: CHEST  1 VIEW COMPARISON:  Chest x-ray dated 05/08/2018 FINDINGS: The right-sided Port-A-Cath is unchanged in positioning. The lung volumes are low. The cardiac silhouette is enlarged. There is improved aeration throughout the left upper lobe. Evaluation is significantly limited by patient body habitus. No large pneumothorax or pleural effusion. IMPRESSION: 1. Slight interval improvement in aeration within the left upper lobe. 2. Low lung volumes and patient body habitus limit evaluation. 3.  Cardiomegaly. 4. Well-positioned right-sided Port-A-Cath. Electronically Signed   By: Constance Holster M.D.   On: 05/13/2018 17:19   Dg Chest 2 View  Result Date: 06/05/2018 CLINICAL DATA:  Shortness of breath. EXAM: CHEST - 2 VIEW COMPARISON:  Chest x-ray dated Jun 01, 2018. FINDINGS: Unchanged right chest wall port catheter. Stable cardiomegaly with pulmonary vascular congestion and mild interstitial edema. Increasing hazy density of the left lung base related to new small pleural effusion and associated left basilar atelectasis. No pneumothorax. No acute osseous abnormality. IMPRESSION: 1. New small left pleural effusion. Unchanged mild interstitial pulmonary edema. Electronically Signed   By: Titus Dubin M.D.   On: 06/05/2018 09:06   Dg Ankle Complete Right  Result Date: 06/01/2018 CLINICAL DATA:  Pain  and swelling EXAM: RIGHT ANKLE - COMPLETE 3+ VIEW COMPARISON:  Right foot 01/17/2016 FINDINGS: Negative for acute fracture. Ankle joint space normal. No effusion. Small ossicle at the tip of the distal fibula may be due to old injury. Diffuse soft tissue swelling. IMPRESSION: Diffuse soft tissue swelling.  Negative for acute fracture. Electronically Signed   By: Franchot Gallo M.D.   On: 06/01/2018 19:16   Dg Chest Port 1 View  Result Date: 06/01/2018 CLINICAL DATA:  Shortness of breath. EXAM: PORTABLE CHEST 1 VIEW COMPARISON:  05/13/2018 FINDINGS: 1802 hours. The cardio pericardial silhouette is enlarged. Vascular congestion is associated with pulmonary edema pattern bilaterally. No substantial pleural effusions. The visualized bony structures of the thorax are intact. Right Port-A-Cath again noted. Telemetry leads overlie the chest. IMPRESSION: Cardiomegaly with vascular congestion and pulmonary edema pattern. Electronically Signed   By: Misty Stanley M.D.   On: 06/01/2018 18:27   Dg Chest Port 1 View  Result Date: 05/08/2018 CLINICAL DATA:  68 year old female with vomiting. EXAM: PORTABLE  CHEST 1 VIEW COMPARISON:  Or 30 20 and earlier. FINDINGS: Portable AP semi upright view at 0230 hours. Large body habitus. Stable cardiac size and mediastinal contours. Lower lung volumes. New asymmetric patchy and indistinct opacity in the left lung. New linear opacity in the right mid lung which most resembles atelectasis. No pneumothorax or pleural effusion identified. Right chest Port-A-Cath redemonstrated and accessed. Visualized tracheal air column is within normal limits. No acute osseous abnormality identified. IMPRESSION: Lower lung volumes. New patchy and indistinct opacity in the left lung, suspicious for aspiration in this clinical setting. Right lung atelectasis. Electronically Signed   By: Genevie Ann M.D.   On: 05/08/2018 03:13   Dg Chest Portable 1 View  Result Date: 05/07/2018 CLINICAL DATA:  Altered status, unsteady gait, shortness of breath, diabetes mellitus, hypertension, cerebral palsy, GERD EXAM: PORTABLE CHEST 1 VIEW COMPARISON:  Portable exam 1621 hours compared to 04/29/2018 FINDINGS: RIGHT jugular Port-A-Cath with tip projecting over SVC. Enlargement of cardiac silhouette. Mediastinal contours and pulmonary vascularity normal. Question mild atelectasis at RIGHT base. Remaining lungs grossly clear for technique. No definite pleural effusion or pneumothorax. IMPRESSION: Enlargement of cardiac silhouette. Question mild RIGHT basilar atelectasis. Electronically Signed   By: Lavonia Dana M.D.   On: 05/07/2018 16:35    CBC Recent Labs  Lab 06/01/18 1741  WBC 7.5  HGB 8.8*  HCT 28.5*  PLT 228  MCV 96.9  MCH 29.9  MCHC 30.9  RDW 14.7   Chemistries  Recent Labs  Lab 06/01/18 1741 06/02/18 0536 06/03/18 0615 06/04/18 0545 06/05/18 0544 06/06/18 0601  NA 142 145 142 145 142 139  K 3.2* 3.5 3.4* 3.6 4.0 4.3  CL 106 108 102 100 97* 94*  CO2 24 28 28 31 30  32  GLUCOSE 125* 95 108* 121* 122* 109*  BUN 33* 31* 27* 28* 28* 33*  CREATININE 2.29* 2.10* 1.91* 1.97* 2.21* 2.50*   CALCIUM 7.8* 7.7* 7.9* 8.7* 8.9 8.9  MG  --  1.2* 1.7 1.9 1.7 1.6*  AST 15  --   --   --   --   --   ALT 14  --   --   --   --   --   ALKPHOS 77  --   --   --   --   --   BILITOT 1.0  --   --   --   --   --    Lab Results  Component Value Date   HGBA1C 5.3 02/27/2016   Cardiac Enzymes Recent Labs  Lab 06/01/18 1741  TROPONINI 0.03*   ------------------------------------------------------------------------------------------------------------------    Component Value Date/Time   BNP 47.0 06/01/2018 1741   Denyla Cortese M.D on 06/06/2018 at 3:33 PM  Go to [email or web address] - for contact info  [facility] - Office  [phone]

## 2018-06-06 NOTE — Progress Notes (Signed)
Physical Therapy Treatment Patient Details Name: Rachel Morgan MRN: [id number] DOB: 01-17-1950 Today's Date: 06/06/2018    History of Present Illness Rachel Morgan is a 68 y.o. female with medical history significant of cerebral palsy, essential hypertension, hypothyroidism, diastolic CHF with ejection fraction 60%, CKD stage III, GERD came to the hospital for evaluation of lower extremity swelling and exertional shortness of breath.  Due to patient's cerebral palsy and unable to carry on a full meaningful conversation, history is provided by the sister over the phone.    PT Comments    Patient continues to fatigue easily and can only tolerate standing/taking steps for up to 20-30 seconds before having to sit.  Patient required 2 attempts to transfer to chair having to sit at bedside after a few steps then able to transfer to chair after a brief rest break.  Patient tolerated sitting up in chair with her sister present at bedside after therapy - RN aware.  Patient will benefit from continued physical therapy in hospital and recommended venue below to increase strength, balance, endurance for safe ADLs and gait.   Follow Up Recommendations  SNF;Supervision/Assistance - 24 hour;Supervision for mobility/OOB     Equipment Recommendations  None recommended by PT    Recommendations for Other Services       Precautions / Restrictions Precautions Precautions: Fall Restrictions Weight Bearing Restrictions: No    Mobility  Bed Mobility Overal bed mobility: Needs Assistance Bed Mobility: Supine to Sit     Supine to sit: Mod assist     General bed mobility comments: increased time, slow labored movement with help to move BLE  Transfers Overall transfer level: Needs assistance Equipment used: Rolling walker (2 wheeled) Transfers: Sit to/from [facility] Sit to Stand: Min assist;Mod assist Stand pivot transfers: Mod assist       General transfer comment:  limited secondary to BLE weakness  Ambulation/Gait Ambulation/Gait assistance: Mod assist Gait Distance (Feet): 3 Feet Assistive device: Rolling walker (2 wheeled) Gait Pattern/deviations: Decreased step length - right;Decreased step length - left;Decreased stride length Gait velocity: slow   General Gait Details: limitd to 3-4 slow labored steps due to BLE weakness, only able to stand/take steps for 20-30 seconds before having to sit   Stairs             Wheelchair Mobility    Modified Rankin (Stroke Patients Only)       Balance Overall balance assessment: Needs assistance Sitting-balance support: Feet supported;No upper extremity supported Sitting balance-Leahy Scale: Fair Sitting balance - Comments: fair/good   Standing balance support: Bilateral upper extremity supported;During functional activity Standing balance-Leahy Scale: Poor Standing balance comment: fair/poor using RW                            Cognition Arousal/Alertness: Awake/alert Behavior During Therapy: WFL for tasks assessed/performed Overall Cognitive Status: History of cognitive impairments - at baseline                                        Exercises General Exercises - Lower Extremity Long Arc Quad: Seated;Strengthening;AAROM;Both;5 reps    General Comments        Pertinent Vitals/Pain Pain Assessment: No/denies pain    Home Living  Prior Function            PT Goals (current goals can now be found in the care plan section) Acute Rehab PT Goals Patient Stated Goal: return home PT Goal Formulation: With patient/family Time For Goal Achievement: 06/18/18 Potential to Achieve Goals: Good Progress towards PT goals: Progressing toward goals    Frequency    Min 3X/week      PT Plan Current plan remains appropriate    Co-evaluation              AM-PAC PT "6 Clicks" Mobility   Outcome Measure  Help needed  turning from your back to your side while in a flat bed without using bedrails?: A Lot Help needed moving from lying on your back to sitting on the side of a flat bed without using bedrails?: A Lot Help needed moving to and from a bed to a chair (including a wheelchair)?: A Lot Help needed standing up from a chair using your arms (e.g., wheelchair or bedside chair)?: A Lot Help needed to walk in hospital room?: A Lot Help needed climbing 3-5 steps with a railing? : Total 6 Click Score: 11    End of Session   Activity Tolerance: Patient tolerated treatment well;Patient limited by fatigue Patient left: in chair;with call bell/phone within reach;with family/visitor present Nurse Communication: Mobility status PT Visit Diagnosis: Unsteadiness on feet (R26.81);Other abnormalities of gait and mobility (R26.89);Muscle weakness (generalized) (M62.81)     Time: 1898-4210 PT Time Calculation (min) (ACUTE ONLY): 23 min  Charges:  $Therapeutic Activity: 23-37 mins                     11:42 AM, 06/06/18 Lonell Grandchild, MPT Physical Therapist with [facility] 336 [phone] office [phone] mobile phone

## 2018-06-07 ENCOUNTER — Inpatient Hospital Stay (HOSPITAL_COMMUNITY): Payer: Medicare Other

## 2018-06-07 LAB — BASIC METABOLIC PANEL
Anion gap: 15 (ref 5–15)
BUN: 39 mg/dL — ABNORMAL HIGH (ref 8–23)
CO2: 30 mmol/L (ref 22–32)
Calcium: 9 mg/dL (ref 8.9–10.3)
Chloride: 93 mmol/L — ABNORMAL LOW (ref 98–111)
Creatinine, Ser: 2.34 mg/dL — ABNORMAL HIGH (ref 0.44–1.00)
GFR calc Af Amer: 24 mL/min — ABNORMAL LOW (ref >60)
GFR calc non Af Amer: 21 mL/min — ABNORMAL LOW (ref >60)
Glucose, Bld: 123 mg/dL — ABNORMAL HIGH (ref 70–99)
Potassium: 4.1 mmol/L (ref 3.5–5.1)
Sodium: 138 mmol/L (ref 135–145)

## 2018-06-07 LAB — GLUCOSE, CAPILLARY: Glucose-Capillary: 119 mg/dL — ABNORMAL HIGH (ref 70–99)

## 2018-06-07 MED ORDER — FUROSEMIDE 10 MG/ML IJ SOLN
60.0000 mg | Freq: Once | INTRAMUSCULAR | Status: AC
  Administered 2018-06-07: 60 mg via INTRAVENOUS
  Filled 2018-06-07: qty 6

## 2018-06-07 MED ORDER — FUROSEMIDE 10 MG/ML IJ SOLN: 60.0000 mg | Freq: Two times a day (BID) | INTRAMUSCULAR | Status: DC

## 2018-06-07 MED ORDER — FUROSEMIDE 10 MG/ML IJ SOLN
40.0000 mg | Freq: Two times a day (BID) | INTRAMUSCULAR | Status: DC
  Administered 2018-06-07 – 2018-06-08 (×2): 40 mg via INTRAVENOUS
  Filled 2018-06-07 (×2): qty 4

## 2018-06-07 NOTE — Progress Notes (Signed)
Patient Demographics:    Rachel Morgan, is a 68 y.o. female, DOB - 01/19/1950, PPJ:[id number]  Admit date - 06/01/2018   Admitting Physician Ankit Arsenio Loader, MD  Outpatient Primary MD for the patient is [facility]  LOS - 6  Chief Complaint  Patient presents with  . Shortness of Breath        Subjective:    [name] today has no fevers, no emesis,  No chest pain,  Sister Rachel Morgan at bedside, questions answered .... Eating and drinking well, voiding okay  Assessment  & Plan :    Principal Problem:   Acute on chronic diastolic CHF (congestive heart failure) (HCC) Active Problems:   Acute renal failure (HCC)   Hypothyroidism   Hypertension   Depression with anxiety   CKD (chronic kidney disease), stage III (HCC)   GERD (gastroesophageal reflux disease)   Cerebral palsy (HCC)   Acute exacerbation of CHF (congestive heart failure) (HCC)   Hypoxia   Acute renal failure superimposed on stage 3 chronic kidney disease (HCC)   Acute respiratory failure with hypoxia (HCC)   Hypokalemia   Morbid obesity with BMI of 50.0-59.9, adult ([place])  Brief Summary 68 year-old morbidly obese female with cerebral palsy, hypertension, hypothyroidism, chronic kidney disease stage III, diastolic CHF and GERD presented with progressive lower extremity swelling over the last several days associated with orthopnea and dyspnea.  Her PCP prescribed her 20 mg of daily Lasix with no improvement of her symptoms.  Patient found to have acute decompensation of her diastolic CHF with pulmonary edema, with acute on chronic kidney disease  A/p Acute on chronic diastolic CHF (congestive heart failure) (HCC) Acute respiratory failure with hypoxia - Weight is down 16 pounds since admission, hypoxia and significant dyspnea on exertion persist,, needs additional IV Lasix /diuresis,   chest x-ray on  06/07/2018  With worsening CHF findings .... continue 2 L O2 via nasal cannula.  Recent echo showed preserved LV systolic function with moderately increased wall thickness. Monitor strict I's/O and daily weight.  c/n oxygen , hypoxia is persistent , plan for home  oxygen, Wt is down 16 lb since admission, fluid Balance is Negative (patient weight is down to 262 pounds (119kg) from 278 pounds this admission).. Please note the patient's weight was 1 2 2  kg on 05/09/2018  c/n IV Lasix for 1 more day, Discussed with Dr. Marval Regal from nephrology service as patient's creatinine is trending up, nephrology service is familiar with patient from previous hospitalizations, okay to continue IV Lasix  Acute hypoxic respiratory failure--- secondary to CHF exacerbation/volume overload, hypoxia persist despite diuresis, patient will need home O2  Active Problems: Acute kidney injury superimposed on chronic kidney disease stage III (baseline creatinine 1.8-2) Suspect cardiorenal. Creatinine is 2.3 with diuresis    Avoid nephrotoxins. Discussed with Dr. Marval Regal from nephrology service as patient's creatinine is trending up, nephrology service is familiar with patient from previous hospitalizations, okay to continue IV Lasix  Hypokalemia/hypomagnesemia Noted multiple PVCs on the monitor.    Magnesium is down to 1.6, replace iv   Essential hypertension Stable.  Continue home meds  Hypothyroidism Continue Synthroid.  Cerebral palsy Continue home meds including olanzapine, citalopram, risperidone and alprazolam. No agitation or  worsened confusion.  Generalized weakness/debility--- physical therapy eval appreciated, they recommend SNF rehab, patient sister/caregiver is not sure she wants patient to go to SNF at this time due to her cognitive deficits--- possible d/c home with [facility] PT and RN, aide, oxygen and [employer]  Code Status : Full code  Family Communication  :  Sister Rachel Morgan at bedside, sister  [name] by Phone  Disposition Plan  :  SNF rehab versus home with home health  Barriers For Discharge : Active symptoms  Procedures  : None  DVT Prophylaxis  : Subcu heparin  Disposition/Need for in-Hospital Stay- patient unable to be discharged at this time due to hypoxia and respiratory problems persist needs additional IV diuresis (Iv Lasix).... possible d/c home with [facility] PT and RN, aide, oxygen and [employer]   Lab Results  Component Value Date   PLT 228 06/01/2018    Inpatient Medications  Scheduled Meds: . ALPRAZolam  1 mg Oral BID  . aspirin EC  81 mg Oral q morning - 10a  . busPIRone  10 mg Oral BID  . chlorproMAZINE  25 mg Oral TID  . escitalopram  20 mg Oral BID  . furosemide  60 mg Intravenous BID  . heparin  5,000 Units Subcutaneous Q8H  . levothyroxine  50 mcg Oral QAC breakfast  . OLANZapine  20 mg Oral QHS  . oxybutynin  10 mg Oral q morning - 10a  . pantoprazole  40 mg Oral Daily  . potassium chloride  40 mEq Oral Daily  . zolpidem  5 mg Oral QHS   Continuous Infusions: PRN Meds:.acetaminophen **OR** acetaminophen, hydrALAZINE, HYDROcodone-acetaminophen, ondansetron **OR** ondansetron (ZOFRAN) IV, polyethylene glycol, senna-docusate   Anti-infectives (From admission, onward)   None       Objective:   Vitals:   06/06/18 2133 06/07/18 0522 06/07/18 1328 06/07/18 1437  BP: (!) 107/46 113/71 (!) 97/49   Pulse: 68 73 70   Resp: 19 18 18    Temp: 98 F (36.7 C) 98.3 F (36.8 C) 97.6 F (36.4 C)   TempSrc: Oral Oral Oral   SpO2: 98% 96% 96%   Weight:    119 kg  Height:        Wt Readings from Last 3 Encounters:  06/07/18 119 kg  05/11/18 126.4 kg  04/29/18 130 kg    Intake/Output Summary (Last 24 hours) at 06/07/2018 1507 Last data filed at 06/07/2018 1329 Gross per 24 hour  Intake 1200 ml  Output 2350 ml  Net -1150 ml   Physical Exam Patient is examined daily including today on 06/07/18 , exams remain the same as of yesterday except that  has changed   Gen:- Awake Alert,  Obese, no acute distress at rest HEENT:- [employer].AT, No sclera icterus Nose- [place] 2L/min Neck-Supple Neck, _+ JVD,.  Lungs-diminished in bases with bibasilar rales  CV- S1, S2 normal, regular , Rt Subclavian Portha-Cath in situ Abd-  +ve B.Sounds, Abd Soft, No tenderness, increased truncal adiposity    Extremity/Skin:-1+ edema, pedal pulses present, Psych-significant cognitive and verbal deficits due to underlying cerebral palsy  Neuro-generalized weakness, no new focal deficits, no tremors   Data Review:   Micro Results Recent Results (from the past 240 hour(s))  SARS Coronavirus 2 (CEPHEID - Performed in [place] hospital lab), Hosp Order     Status: None   Collection Time: 06/01/18  6:10 PM  Result Value Ref Range Status   SARS Coronavirus 2 NEGATIVE NEGATIVE Final    Comment: (NOTE)  If result is NEGATIVE SARS-CoV-2 target nucleic acids are NOT DETECTED. The SARS-CoV-2 RNA is generally detectable in upper and lower  respiratory specimens during the acute phase of infection. The lowest  concentration of SARS-CoV-2 viral copies this assay can detect is 250  copies / mL. A negative result does not preclude SARS-CoV-2 infection  and should not be used as the sole basis for treatment or other  patient management decisions.  A negative result may occur with  improper specimen collection / handling, submission of specimen other  than nasopharyngeal swab, presence of viral mutation(s) within the  areas targeted by this assay, and inadequate number of viral copies  (<250 copies / mL). A negative result must be combined with clinical  observations, patient history, and epidemiological information. If result is POSITIVE SARS-CoV-2 target nucleic acids are DETECTED. The SARS-CoV-2 RNA is generally detectable in upper and lower  respiratory specimens dur ing the acute phase of infection.  Positive  results are indicative of active infection with SARS-CoV-2.   Clinical  correlation with patient history and other diagnostic information is  necessary to determine patient infection status.  Positive results do  not rule out bacterial infection or co-infection with other viruses. If result is PRESUMPTIVE POSTIVE SARS-CoV-2 nucleic acids MAY BE PRESENT.   A presumptive positive result was obtained on the submitted specimen  and confirmed on repeat testing.  While 2019 novel coronavirus  (SARS-CoV-2) nucleic acids may be present in the submitted sample  additional confirmatory testing may be necessary for epidemiological  and / or clinical management purposes  to differentiate between  SARS-CoV-2 and other Sarbecovirus currently known to infect humans.  If clinically indicated additional testing with an alternate test  methodology [phone]) is advised. The SARS-CoV-2 RNA is generally  detectable in upper and lower respiratory sp ecimens during the acute  phase of infection. The expected result is Negative. Fact Sheet for Patients:  StrictlyIdeas.no Fact Sheet for Healthcare Providers: BankingDealers.co.za This test is not yet approved or cleared by the [place] FDA and has been authorized for detection and/or diagnosis of SARS-CoV-2 by FDA under an Emergency Use Authorization (EUA).  This EUA will remain in effect (meaning this test can be used) for the duration of the COVID-19 declaration under Section 564(b)(1) of the Act, 21 U.S.C. section 360bbb-3(b)(1), unless the authorization is terminated or revoked sooner. Performed at [facility], [address] [place], [place] [address]     Radiology Reports Dg Chest 1 View  Result Date: 05/13/2018 CLINICAL DATA:  Pneumonia. EXAM: CHEST  1 VIEW COMPARISON:  Chest x-ray dated 05/08/2018 FINDINGS: The right-sided Port-A-Cath is unchanged in positioning. The lung volumes are low. The cardiac silhouette is enlarged. There is improved aeration  throughout the left upper lobe. Evaluation is significantly limited by patient body habitus. No large pneumothorax or pleural effusion. IMPRESSION: 1. Slight interval improvement in aeration within the left upper lobe. 2. Low lung volumes and patient body habitus limit evaluation. 3. Cardiomegaly. 4. Well-positioned right-sided Port-A-Cath. Electronically Signed   By: Constance Holster M.D.   On: 05/13/2018 17:19   Dg Chest 2 View  Result Date: 06/05/2018 CLINICAL DATA:  Shortness of breath. EXAM: CHEST - 2 VIEW COMPARISON:  Chest x-ray dated Jun 01, 2018. FINDINGS: Unchanged right chest wall port catheter. Stable cardiomegaly with pulmonary vascular congestion and mild interstitial edema. Increasing hazy density of the left lung base related to new small pleural effusion and associated left basilar atelectasis. No pneumothorax. No acute osseous  abnormality. IMPRESSION: 1. New small left pleural effusion. Unchanged mild interstitial pulmonary edema. Electronically Signed   By: Titus Dubin M.D.   On: 06/05/2018 09:06   Dg Ankle Complete Right  Result Date: 06/01/2018 CLINICAL DATA:  Pain  and swelling EXAM: RIGHT ANKLE - COMPLETE 3+ VIEW COMPARISON:  Right foot 01/17/2016 FINDINGS: Negative for acute fracture. Ankle joint space normal. No effusion. Small ossicle at the tip of the distal fibula may be due to old injury. Diffuse soft tissue swelling. IMPRESSION: Diffuse soft tissue swelling.  Negative for acute fracture. Electronically Signed   By: Franchot Gallo M.D.   On: 06/01/2018 19:16   Dg Chest Port 1 View  Result Date: 06/07/2018 CLINICAL DATA:  Respiratory failure, hypoxia EXAM: PORTABLE CHEST 1 VIEW COMPARISON:  06/05/2018 chest radiograph. FINDINGS: Right internal jugular Port-A-Cath terminates at the cavoatrial junction. Stable cardiomediastinal silhouette with mild cardiomegaly. No pneumothorax. No pleural effusion. Right rotated chest radiograph. Mild-to-moderate pulmonary edema appears  slightly worsened. IMPRESSION: Mild-to-moderate congestive heart failure, slightly worsened. Electronically Signed   By: Ilona Sorrel M.D.   On: 06/07/2018 10:06   Dg Chest Port 1 View  Result Date: 06/01/2018 CLINICAL DATA:  Shortness of breath. EXAM: PORTABLE CHEST 1 VIEW COMPARISON:  05/13/2018 FINDINGS: 1802 hours. The cardio pericardial silhouette is enlarged. Vascular congestion is associated with pulmonary edema pattern bilaterally. No substantial pleural effusions. The visualized bony structures of the thorax are intact. Right Port-A-Cath again noted. Telemetry leads overlie the chest. IMPRESSION: Cardiomegaly with vascular congestion and pulmonary edema pattern. Electronically Signed   By: Misty Stanley M.D.   On: 06/01/2018 18:27    CBC Recent Labs  Lab 06/01/18 1741  WBC 7.5  HGB 8.8*  HCT 28.5*  PLT 228  MCV 96.9  MCH 29.9  MCHC 30.9  RDW 14.7   Chemistries  Recent Labs  Lab 06/01/18 1741 06/02/18 0536 06/03/18 0615 06/04/18 0545 06/05/18 0544 06/06/18 0601 06/07/18 0612  NA 142 145 142 145 142 139 138  K 3.2* 3.5 3.4* 3.6 4.0 4.3 4.1  CL 106 108 102 100 97* 94* 93*  CO2 24 28 28 31 30  32 30  GLUCOSE 125* 95 108* 121* 122* 109* 123*  BUN 33* 31* 27* 28* 28* 33* 39*  CREATININE 2.29* 2.10* 1.91* 1.97* 2.21* 2.50* 2.34*  CALCIUM 7.8* 7.7* 7.9* 8.7* 8.9 8.9 9.0  MG  --  1.2* 1.7 1.9 1.7 1.6*  --   AST 15  --   --   --   --   --   --   ALT 14  --   --   --   --   --   --   ALKPHOS 77  --   --   --   --   --   --   BILITOT 1.0  --   --   --   --   --   --    Lab Results  Component Value Date   HGBA1C 5.3 02/27/2016   Cardiac Enzymes Recent Labs  Lab 06/01/18 1741  TROPONINI 0.03*   ------------------------------------------------------------------------------------------------------------------    Component Value Date/Time   BNP 47.0 06/01/2018 1741   Tranquilino Fischler M.D on 06/07/2018 at 3:07 PM  Go to [email or web address] - for contact info  [facility] - Office  [phone]

## 2018-06-08 LAB — BASIC METABOLIC PANEL
Anion gap: 14 (ref 5–15)
BUN: 41 mg/dL — ABNORMAL HIGH (ref 8–23)
CO2: 32 mmol/L (ref 22–32)
Calcium: 9.2 mg/dL (ref 8.9–10.3)
Chloride: 91 mmol/L — ABNORMAL LOW (ref 98–111)
Creatinine, Ser: 2.62 mg/dL — ABNORMAL HIGH (ref 0.44–1.00)
GFR calc Af Amer: 21 mL/min — ABNORMAL LOW (ref >60)
GFR calc non Af Amer: 18 mL/min — ABNORMAL LOW (ref >60)
Glucose, Bld: 126 mg/dL — ABNORMAL HIGH (ref 70–99)
Potassium: 4.3 mmol/L (ref 3.5–5.1)
Sodium: 137 mmol/L (ref 135–145)

## 2018-06-08 MED ORDER — ASPIRIN EC 81 MG PO TBEC: 81.0000 mg | DELAYED_RELEASE_TABLET | Freq: Every day | ORAL | 4 refills | Status: DC

## 2018-06-08 MED ORDER — SODIUM BICARBONATE 650 MG PO TABS: 650.0000 mg | ORAL_TABLET | Freq: Every day | ORAL | 0 refills | Status: DC

## 2018-06-08 MED ORDER — ZOLPIDEM TARTRATE 5 MG PO TABS: 5.0000 mg | ORAL_TABLET | Freq: Every evening | ORAL | 1 refills | Status: DC | PRN

## 2018-06-08 MED ORDER — POTASSIUM CHLORIDE CRYS ER 20 MEQ PO TBCR: 40.0000 meq | EXTENDED_RELEASE_TABLET | Freq: Every day | ORAL | 1 refills | Status: DC

## 2018-06-08 MED ORDER — ACETAMINOPHEN 325 MG PO TABS: 650.0000 mg | ORAL_TABLET | Freq: Four times a day (QID) | ORAL | 1 refills | Status: AC | PRN

## 2018-06-08 MED ORDER — TORSEMIDE 20 MG PO TABS: 20.0000 mg | ORAL_TABLET | Freq: Two times a day (BID) | ORAL | 2 refills | Status: DC

## 2018-06-08 NOTE — Discharge Summary (Signed)
Rachel Morgan, is a 68 y.o. female  DOB 1950-03-10  MRN [id number].  Admission date:  06/01/2018  Admitting Physician  Damita Lack, MD  Discharge Date:  06/08/2018   Primary MD  [facility]  Recommendations for primary care physician for things to follow:   1)Very low-salt diet advised 2)Weigh yourself daily, call if you gain more than 3 pounds in 1 day or more than 5 pounds in 1 week as your diuretic medications may need to be adjusted 3)Limit your Fluid  intake to no more than 60 ounces (1.8 Liters) per day 4)Avoid ibuprofen/Advil/Aleve/Motrin/Goody Powders/Naproxen/BC powders/Meloxicam/Diclofenac/Indomethacin and other Nonsteroidal anti-inflammatory medications as these will make you more likely to bleed and can cause stomach ulcers, can also cause Kidney problems.  5)Repeat BMP/kidney electrolyte test on Friday, 06/12/2018 (home health RN may be able to draw the blood sample)  Admission Diagnosis  Acute pulmonary edema (HCC) [J81.0] SOB (shortness of breath) [R06.02] Elevated troponin [R79.89] Cognitive impairment [R41.89] Requires supplemental oxygen [Z99.81] Hypervolemia, unspecified hypervolemia type [E87.70]  Discharge Diagnosis  Acute pulmonary edema (HCC) [J81.0] SOB (shortness of breath) [R06.02] Elevated troponin [R79.89] Cognitive impairment [R41.89] Requires supplemental oxygen [Z99.81] Hypervolemia, unspecified hypervolemia type [E87.70]   Principal Problem:   Acute on chronic diastolic CHF (congestive heart failure) (HCC) Active Problems:   Acute renal failure (HCC)   Hypothyroidism   Hypertension   Depression with anxiety   CKD (chronic kidney disease), stage III (HCC)   GERD (gastroesophageal reflux disease)   Cerebral palsy (HCC)   Acute exacerbation of CHF (congestive heart failure) (HCC)   Hypoxia   Acute renal failure superimposed on stage  3 chronic kidney disease ([place])   Acute respiratory failure with hypoxia (HCC)   Hypokalemia   Morbid obesity with BMI of 50.0-59.9, adult ([place])      Past Medical History:  Diagnosis Date  . Anxiety   . Calculus of gallbladder   . Cerebral palsy ([place])   . Chronic kidney disease   . Diabetes mellitus   . Gall stones   . GERD (gastroesophageal reflux disease)   . High cholesterol   . History of kidney stones   . Hypertension   . Hypothyroidism   . Kidney stones   . MR (mental retardation)   . Pyelonephritis   . Sepsis [facility])     Past Surgical History:  Procedure Laterality Date  . CYSTOSCOPY W/ URETERAL STENT PLACEMENT Bilateral 09/26/2015   Procedure: CYSTOSCOPY WITH Bilateral  RETROGRADE PYELOGRAM/URETERAL STENT PLACEMENT;  Surgeon: [name], MD;  Location: [facility];  Service: Urology;  Laterality: Bilateral;  . CYSTOSCOPY W/ URETERAL STENT PLACEMENT Right 04/22/2018   Procedure: CYSTOSCOPY WITH RETROGRADE PYELOGRAM/URETERAL STENT PLACEMENT;  Surgeon: Cleon Gustin, MD;  Location: [facility];  Service: Urology;  Laterality: Right;  . CYSTOSCOPY W/ URETERAL STENT REMOVAL Left 11/10/2015   Procedure: CYSTOSCOPY WITH STENT REMOVAL;  Surgeon: Cleon Gustin, MD;  Location: [facility];  Service: Urology;  Laterality: Left;  . CYSTOSCOPY WITH STENT  PLACEMENT Right 01/21/2016   Procedure: CYSTOSCOPY WITH STENT PLACEMENT;  Surgeon: Franchot Gallo, MD;  Location: [facility];  Service: Urology;  Laterality: Right;  . CYSTOSCOPY/RETROGRADE/URETEROSCOPY/STONE EXTRACTION WITH BASKET Left 10/31/2015   Procedure: CYSTOSCOPY/ BILATERAL URETEROSCOPY/BILATERAL STONE EXTRACTION/ LEFT STENT PLACEMENT;  Surgeon: Irine Seal, MD;  Location: [facility];  Service: Urology;  Laterality: Left;  . CYSTOSCOPY/URETEROSCOPY/HOLMIUM LASER/STENT PLACEMENT Right 02/27/2016   Procedure: CYSTOSCOPY/RIGHT URETEROSCOPY/HOLMIUM LASER/STENT PLACEMENT/STENT REMOVAL;  Surgeon: Irine Seal, MD;  Location: [facility];  Service:  Urology;  Laterality: Right;  . HOLMIUM LASER APPLICATION N/A 71/06/2692   Procedure: HOLMIUM LASER APPLICATION;  Surgeon: Irine Seal, MD;  Location: [facility];  Service: Urology;  Laterality: N/A;  . IR CV LINE INJECTION  05/06/2018  . IR FLUORO GUIDE PORT INSERTION RIGHT  04/10/2016  . IR US GUIDE VASC ACCESS RIGHT  04/10/2016     HPI  from the history and physical done on the day of admission:    Chief Complaint: Shortness of breath  HPI: Rachel Morgan is a 68 y.o. female with medical history significant of cerebral palsy, essential hypertension, hypothyroidism, diastolic CHF with ejection fraction 60%, CKD stage III, GERD came to the hospital for evaluation of lower extremity swelling and exertional shortness of breath.  Due to patient's cerebral palsy and unable to carry on a full meaningful conversation, history is provided by the sister over the phone. Sister tells me that for the past several days they have noticed progressive lower extremity swelling.  She was placed on Lasix 20 mg daily by her outpatient PCP about a week ago but despite of taking this she has progressively noticed lower extremity swelling and in the last 2 or 3 days also shortness of breath especially even walking to the bathroom.  Typically patient only goes to the bathroom with the help of a walker but otherwise remains mostly in her bed.  She does have some notable orthopnea as well.  With the symptoms they had called her outpatient provider who advised her to come to the ER.  In the ER she was noted to have bilateral lower extremity swelling.  Creatinine was 2.29 up from baseline of 1.7. CXR showed Pulm Vasc Congestion. Ankle XR- showed swelling    Hospital Course:   Brief Summary 68 year-old morbidly obese female with cerebral palsy, hypertension, hypothyroidism, chronic kidney disease stage III, diastolic CHF and GERD presented with progressive lower extremity swelling over the last several days associated with  orthopnea and dyspnea. Her PCP prescribed her 20 mg of daily Lasix with no improvement of her symptoms. Patient found to have acute decompensation of her diastolic CHF with pulmonary edema, with acute on chronic kidney disease  Patient previously qualified for home O2 please see previous documentation, O2 sats at rest around 88%, on 2 L of oxygen via nasal cannula O2 sats was 93% at rest and above 93% with ambulation on 2 L of oxygen via nasal cannula  A/p Acute on chronic diastolicCHF (congestive heart failure) (HCC) Acute respiratory failure with hypoxia - Weight is down 20 pounds since admission (admission weight was 278 pounds on 06/01/2018 which is now down to 258 pounds or 117 kg after diuresis with IV Lasix), hypoxia and significant dyspnea on exertion improving, okay to discharge home on torsemide 20 mg twice daily.    chest x-ray on 06/07/2018  With worsening CHF findings so patient was kept for additional increased dose of Lasix, she lost an additional 4 pounds since 06/07/2018, clinically much improved  after additional IV diuresis ....  Recent echo showed preserved LV systolic function with moderately increased wall thickness. .. Please note the patient's weight was 122 kg on 05/09/2018 Discussed with Dr. Marval Regal from nephrology service as patient's creatinine is trending up, nephrology service is familiar with patient from previous hospitalizations.  Creatinine is currently up to 2.6, repeat BMP on Friday, 06/11/2018 advised  Acute hypoxic respiratory failure--- secondary to CHF exacerbation/volume overload, hypoxia improving with diuresis  Acute kidney injury superimposed on chronic kidney disease stage III (baseline creatinine 1.8-2) Suspect cardiorenal. Creatinine is 2.6 with diuresis   Avoid nephrotoxins. Discussed with Dr. Marval Regal from nephrology service as patient's creatinine is trending up, nephrology service is familiar with patient from previous hospitalizations, repeat BMP  on Friday, 06/11/2018 advised, continue sodium bicarb  Hypokalemia/hypomagnesemia--- electrolytes were replaced, discharged home on p.o. potassium as patient will be on torsemide at home, repeat BMP on Friday, 06/11/2018 advised  Essential hypertension Stable. Continue home meds  Hypothyroidism Continue Synthroid.  Cerebral palsy Continue home meds including olanzapine, citalopram, risperidone and alprazolam. No agitation or worsened confusion.  Generalized weakness/debility--- physical therapy eval appreciated, they recommend SNF rehab, patient sister/caregiver declines SNF rehab at this time due to her cognitive deficits---home with HH PT and RN, aide, oxygen and [employer] streamer  Code Status:Full code  Family Communication : Sister [name] at bedside, sister [name] by Phone  Disposition Plan:  home with home health  Barriers For Discharge:Active symptoms  Procedures : None  Discharge Condition: stable  Follow UP  Follow-up Information    [facility] Follow up on 06/15/2018.   Why:  phone visit @ 10:15am  Contact information: [address] [place] [address] [phone]           Diet and Activity recommendation:  As advised  Discharge Instructions    Discharge Instructions    Call MD for:  difficulty breathing, headache or visual disturbances   Complete by:  As directed    Call MD for:  persistant dizziness or light-headedness   Complete by:  As directed    Call MD for:  persistant nausea and vomiting   Complete by:  As directed    Call MD for:  temperature >100.4   Complete by:  As directed    Diet - low sodium heart healthy   Complete by:  As directed    Discharge instructions   Complete by:  As directed    1)1)Very low-salt diet advised 2)Weigh yourself daily, call if you gain more than 3 pounds in 1 day or more than 5 pounds in 1 week as your diuretic medications may need to be adjusted 3)Limit your Fluid   intake to no more than 60 ounces (1.8 Liters) per day 4)Avoid ibuprofen/Advil/Aleve/Motrin/Goody Powders/Naproxen/BC powders/Meloxicam/Diclofenac/Indomethacin and other Nonsteroidal anti-inflammatory medications as these will make you more likely to bleed and can cause stomach ulcers, can also cause Kidney problems.  5) repeat BMP/kidney electrolyte test on Friday, 06/12/2018 (home health RN may be able to draw the blood sample)   Increase activity slowly   Complete by:  As directed        Discharge Medications     Allergies as of 06/08/2018      Reactions   Macrobid [nitrofurantoin Monohyd Macro] Hives, Swelling   Penicillins Swelling, Rash   Has patient had a PCN reaction causing immediate rash, facial/tongue/throat swelling, SOB or lightheadedness with hypotension: Yes Has patient had a PCN reaction causing severe rash involving mucus  membranes or skin necrosis: Yes Has patient had a PCN reaction that required hospitalization: no Has patient had a PCN reaction occurring within the last 10 years: no If all of the above answers are "NO", then may proceed with Cephalosporin use. Patient tolerated rocephin and ertapenem in the past      Medication List    STOP taking these medications   chlorproMAZINE 25 MG tablet Commonly known as:  THORAZINE   furosemide 20 MG tablet Commonly known as:  LASIX   ibuprofen 200 MG tablet Commonly known as:  ADVIL     TAKE these medications   acetaminophen 325 MG tablet Commonly known as:  TYLENOL Take 2 tablets (650 mg total) by mouth every 6 (six) hours as needed for mild pain (or Fever >/= 101).   ALPRAZolam 1 MG tablet Commonly known as:  XANAX Take 1 mg by mouth 2 (two) times daily.   aspirin EC 81 MG tablet Take 1 tablet (81 mg total) by mouth daily with breakfast. What changed:  when to take this   busPIRone 10 MG tablet Commonly known as:  BUSPAR Take 10 mg by mouth 2 (two) times daily.   escitalopram 20 MG tablet Commonly known  as:  LEXAPRO Take 20 mg by mouth 2 (two) times a day.   levothyroxine 50 MCG tablet Commonly known as:  SYNTHROID Take 50 mcg by mouth daily before breakfast.   OLANZapine 20 MG tablet Commonly known as:  ZYPREXA Take 20 mg by mouth at bedtime.   omeprazole 20 MG capsule Commonly known as:  PRILOSEC Take 20 mg by mouth daily before breakfast.   oxybutynin 10 MG 24 hr tablet Commonly known as:  DITROPAN-XL Take 10 mg by mouth every morning.   potassium chloride SA 20 MEQ tablet Commonly known as:  K-DUR Take 2 tablets (40 mEq total) by mouth daily. Start taking on:  June 09, 2018   senna-docusate 8.6-50 MG tablet Commonly known as:  Senokot-S Take 1 tablet by mouth 2 (two) times daily. What changed:    when to take this  reasons to take this   sodium bicarbonate 650 MG tablet Take 1 tablet (650 mg total) by mouth daily. What changed:  when to take this   torsemide 20 MG tablet Commonly known as:  Demadex Take 1 tablet (20 mg total) by mouth 2 (two) times daily. For Fluid   zolpidem 5 MG tablet Commonly known as:  AMBIEN Take 1 tablet (5 mg total) by mouth at bedtime as needed for sleep. What changed:    medication strength  how much to take  when to take this  reasons to take this            Durable Medical Equipment  (From admission, onward)         Start     Ordered   06/05/18 1345  For home use only DME Other see comment  Once    Comments:  [employer].... Pt with underlying cerebral palsy, generalized weakness and mobility related difficulties--- needs Hoyer lift  Question:  Length of Need  Answer:  Lifetime   06/05/18 1345   06/05/18 1208  For home use only DME oxygen  Once    Comments:  O2 sats down to 81% with minimum ambulation,  With 2 L of oxygen via nasal cannula O2 sat is back to 93 to 95%  Patient needs continuous O2 at 2 L/min continuously via nasal cannula with humidifier, with gaseous portability and  conserving device due to  hypoxic respiratory failure secondary to congestive heart failure  Question Answer Comment  Length of Need 12 Months   Mode or (Route) Nasal cannula   Liters per Minute 2   Frequency Continuous (stationary and portable oxygen unit needed)   Oxygen conserving device Yes   Oxygen delivery system Gas      06/05/18 1208          Major procedures and Radiology Reports - PLEASE review detailed and final reports for all details, in brief -   Dg Chest 1 View  Result Date: 05/13/2018 CLINICAL DATA:  Pneumonia. EXAM: CHEST  1 VIEW COMPARISON:  Chest x-ray dated 05/08/2018 FINDINGS: The right-sided Port-A-Cath is unchanged in positioning. The lung volumes are low. The cardiac silhouette is enlarged. There is improved aeration throughout the left upper lobe. Evaluation is significantly limited by patient body habitus. No large pneumothorax or pleural effusion. IMPRESSION: 1. Slight interval improvement in aeration within the left upper lobe. 2. Low lung volumes and patient body habitus limit evaluation. 3. Cardiomegaly. 4. Well-positioned right-sided Port-A-Cath. Electronically Signed   By: Constance Holster M.D.   On: 05/13/2018 17:19   Dg Chest 2 View  Result Date: 06/05/2018 CLINICAL DATA:  Shortness of breath. EXAM: CHEST - 2 VIEW COMPARISON:  Chest x-ray dated Jun 01, 2018. FINDINGS: Unchanged right chest wall port catheter. Stable cardiomegaly with pulmonary vascular congestion and mild interstitial edema. Increasing hazy density of the left lung base related to new small pleural effusion and associated left basilar atelectasis. No pneumothorax. No acute osseous abnormality. IMPRESSION: 1. New small left pleural effusion. Unchanged mild interstitial pulmonary edema. Electronically Signed   By: Titus Dubin M.D.   On: 06/05/2018 09:06   Dg Ankle Complete Right  Result Date: 06/01/2018 CLINICAL DATA:  Pain  and swelling EXAM: RIGHT ANKLE - COMPLETE 3+ VIEW COMPARISON:  Right foot 01/17/2016  FINDINGS: Negative for acute fracture. Ankle joint space normal. No effusion. Small ossicle at the tip of the distal fibula may be due to old injury. Diffuse soft tissue swelling. IMPRESSION: Diffuse soft tissue swelling.  Negative for acute fracture. Electronically Signed   By: Franchot Gallo M.D.   On: 06/01/2018 19:16   Dg Chest Port 1 View  Result Date: 06/07/2018 CLINICAL DATA:  Respiratory failure, hypoxia EXAM: PORTABLE CHEST 1 VIEW COMPARISON:  06/05/2018 chest radiograph. FINDINGS: Right internal jugular Port-A-Cath terminates at the cavoatrial junction. Stable cardiomediastinal silhouette with mild cardiomegaly. No pneumothorax. No pleural effusion. Right rotated chest radiograph. Mild-to-moderate pulmonary edema appears slightly worsened. IMPRESSION: Mild-to-moderate congestive heart failure, slightly worsened. Electronically Signed   By: Ilona Sorrel M.D.   On: 06/07/2018 10:06   Dg Chest Port 1 View  Result Date: 06/01/2018 CLINICAL DATA:  Shortness of breath. EXAM: PORTABLE CHEST 1 VIEW COMPARISON:  05/13/2018 FINDINGS: 1802 hours. The cardio pericardial silhouette is enlarged. Vascular congestion is associated with pulmonary edema pattern bilaterally. No substantial pleural effusions. The visualized bony structures of the thorax are intact. Right Port-A-Cath again noted. Telemetry leads overlie the chest. IMPRESSION: Cardiomegaly with vascular congestion and pulmonary edema pattern. Electronically Signed   By: Misty Stanley M.D.   On: 06/01/2018 18:27    Micro Results   Recent Results (from the past 240 hour(s))  SARS Coronavirus 2 (CEPHEID - Performed in [facility] hospital lab), Hosp Order     Status: None   Collection Time: 06/01/18  6:10 PM  Result Value Ref Range Status   SARS Coronavirus  2 NEGATIVE NEGATIVE Final    Comment: (NOTE) If result is NEGATIVE SARS-CoV-2 target nucleic acids are NOT DETECTED. The SARS-CoV-2 RNA is generally detectable in upper and lower   respiratory specimens during the acute phase of infection. The lowest  concentration of SARS-CoV-2 viral copies this assay can detect is 250  copies / mL. A negative result does not preclude SARS-CoV-2 infection  and should not be used as the sole basis for treatment or other  patient management decisions.  A negative result may occur with  improper specimen collection / handling, submission of specimen other  than nasopharyngeal swab, presence of viral mutation(s) within the  areas targeted by this assay, and inadequate number of viral copies  (<250 copies / mL). A negative result must be combined with clinical  observations, patient history, and epidemiological information. If result is POSITIVE SARS-CoV-2 target nucleic acids are DETECTED. The SARS-CoV-2 RNA is generally detectable in upper and lower  respiratory specimens dur ing the acute phase of infection.  Positive  results are indicative of active infection with SARS-CoV-2.  Clinical  correlation with patient history and other diagnostic information is  necessary to determine patient infection status.  Positive results do  not rule out bacterial infection or co-infection with other viruses. If result is PRESUMPTIVE POSTIVE SARS-CoV-2 nucleic acids MAY BE PRESENT.   A presumptive positive result was obtained on the submitted specimen  and confirmed on repeat testing.  While 2019 novel coronavirus  (SARS-CoV-2) nucleic acids may be present in the submitted sample  additional confirmatory testing may be necessary for epidemiological  and / or clinical management purposes  to differentiate between  SARS-CoV-2 and other Sarbecovirus currently known to infect humans.  If clinically indicated additional testing with an alternate test  methodology [phone]) is advised. The SARS-CoV-2 RNA is generally  detectable in upper and lower respiratory sp ecimens during the acute  phase of infection. The expected result is Negative. Fact  Sheet for Patients:  StrictlyIdeas.no Fact Sheet for Healthcare Providers: BankingDealers.co.za This test is not yet approved or cleared by the [place] FDA and has been authorized for detection and/or diagnosis of SARS-CoV-2 by FDA under an Emergency Use Authorization (EUA).  This EUA will remain in effect (meaning this test can be used) for the duration of the COVID-19 declaration under Section 564(b)(1) of the Act, 21 U.S.C. section 360bbb-3(b)(1), unless the authorization is terminated or revoked sooner. Performed at [facility], [address], [place], [place] [address]        Today   Subjective   Kyiesha Millward today has no new complaints, hypoxia and dyspnea on exertion continues to improve, patient voided very well, fluid balance remains negative          Patient has been seen and examined prior to discharge   Objective   Blood pressure (!) 99/49, pulse 63, temperature 99.3 F (37.4 C), temperature source Oral, resp. rate 18, height 4\' 11"  (1.499 m), weight 117.2 kg, SpO2 94 %.   Intake/Output Summary (Last 24 hours) at 06/08/2018 1228 Last data filed at 06/08/2018 0900 Gross per 24 hour  Intake 1200 ml  Output 1400 ml  Net -200 ml   Exam Gen:- Awake Alert,  Obese, no acute distress at rest HEENT:- [place].AT, No sclera icterus Nose- [place] 2L/min Neck-Supple Neck,   Lungs-improving air movement bilaterally, no wheezing  CV- S1, S2 normal, regular , Rt Subclavian Portha-Cath in situ Abd-  +ve B.Sounds, Abd Soft, No tenderness, increased truncal adiposity  Extremity/Skin:-Trace edema, pedal pulses present, Psych-significant cognitive and verbal deficits due to underlying cerebral palsy  Neuro-generalized weakness, no new focal deficits, no tremors   Data Review   CBC w Diff:  Lab Results  Component Value Date   WBC 7.5 06/01/2018   HGB 8.8 (L) 06/01/2018   HCT 28.5 (L) 06/01/2018   PLT 228 06/01/2018    LYMPHOPCT 14 05/11/2018   MONOPCT 12 05/11/2018   EOSPCT 4 05/11/2018   BASOPCT 1 05/11/2018   CMP:  Lab Results  Component Value Date   NA 137 06/08/2018   K 4.3 06/08/2018   CL 91 (L) 06/08/2018   CO2 32 06/08/2018   BUN 41 (H) 06/08/2018   CREATININE 2.62 (H) 06/08/2018   PROT 5.8 (L) 06/01/2018   ALBUMIN 2.8 (L) 06/01/2018   BILITOT 1.0 06/01/2018   ALKPHOS 77 06/01/2018   AST 15 06/01/2018   ALT 14 06/01/2018  . Total Discharge time is about 33 minutes  Roxan Hockey M.D on 06/08/2018 at 12:28 PM  Go to [email or web address] -  for contact info  [facility] - Office  [phone]

## 2018-06-08 NOTE — Discharge Instructions (Signed)
1)1)Very low-salt diet advised 2)Weigh yourself daily, call if you gain more than 3 pounds in 1 day or more than 5 pounds in 1 week as your diuretic medications may need to be adjusted 3)Limit your Fluid  intake to no more than 60 ounces (1.8 Liters) per day 4)Avoid ibuprofen/Advil/Aleve/Motrin/Goody Powders/Naproxen/BC powders/Meloxicam/Diclofenac/Indomethacin and other Nonsteroidal anti-inflammatory medications as these will make you more likely to bleed and can cause stomach ulcers, can also cause Kidney problems.  5) repeat BMP/kidney electrolyte test on Friday, 06/12/2018 (home health RN may be able to draw the blood sample)

## 2018-06-08 NOTE — Progress Notes (Signed)
Physical Therapy Treatment Patient Details Name: Rachel Morgan MRN: [id number] DOB: 1950-01-18 Today's Date: 06/08/2018    History of Present Illness Rachel Morgan is a 68 y.o. female with medical history significant of cerebral palsy, essential hypertension, hypothyroidism, diastolic CHF with ejection fraction 60%, CKD stage III, GERD came to the hospital for evaluation of lower extremity swelling and exertional shortness of breath.  Due to patient's cerebral palsy and unable to carry on a full meaningful conversation, history is provided by the sister over the phone.    PT Comments    RN requested O2 saturation % on room air prior discharge later today.  Pt's sister in room for session.  Pt continues to require mod A bed mobility but improved mechanics with less assistance for standing.  Pt able to stand by EOB for 1 minute while assessing O2 saturation with good balance and UE support on RW.  O2 saturation at 94% supine and range from 95-99% on room air.  Pt sister instructed use of gait belt with proper fit and mechanics to assist with safety and reduce strain at home care.  Pt returned to bed as plans for DC later today with some assistance required for scooting to [facility] with sister and therapist.      Follow Up Recommendations  SNF;Supervision/Assistance - 24 hour;Supervision for mobility/OOB     Equipment Recommendations  None recommended by PT    Recommendations for Other Services       Precautions / Restrictions Precautions Precautions: Fall Restrictions Weight Bearing Restrictions: No    Mobility  Bed Mobility Overal bed mobility: Needs Assistance Bed Mobility: Supine to Sit Rolling: Mod assist   Supine to sit: Mod assist     General bed mobility comments: increased time, slow labored movement with help to move BLE  Transfers Overall transfer level: Needs assistance Equipment used: Rolling walker (2 wheeled) Transfers: Sit to/from Stand Sit to Stand: Min  assist;Mod assist         General transfer comment: Able to stand min-mod x2 for safety (sister assisted) .  Pt able to stand for 1 min with min A while assessing O2 saturation on room air.   Ambulation/Gait                 Stairs             Wheelchair Mobility    Modified Rankin (Stroke Patients Only)       Balance                                            Cognition Arousal/Alertness: Awake/alert Behavior During Therapy: WFL for tasks assessed/performed Overall Cognitive Status: Within Functional Limits for tasks assessed                                 General Comments: cooperative and follows directions consistently      Exercises      General Comments        Pertinent Vitals/Pain Pain Assessment: No/denies pain    Home Living                      Prior Function            PT Goals (current goals can now be found in the care plan  section)      Frequency    Min 3X/week      PT Plan Current plan remains appropriate    Co-evaluation              AM-PAC PT "6 Clicks" Mobility   Outcome Measure  Help needed turning from your back to your side while in a flat bed without using bedrails?: A Lot Help needed moving from lying on your back to sitting on the side of a flat bed without using bedrails?: A Lot Help needed moving to and from a bed to a chair (including a wheelchair)?: A Lot Help needed standing up from a chair using your arms (e.g., wheelchair or bedside chair)?: A Lot Help needed to walk in hospital room?: A Lot Help needed climbing 3-5 steps with a railing? : Total 6 Click Score: 11    End of Session Equipment Utilized During Treatment: Gait belt Activity Tolerance: Patient tolerated treatment well;Patient limited by fatigue Patient left: in bed;with bed alarm set;with call bell/phone within reach;with family/visitor present Nurse Communication: Mobility status PT Visit  Diagnosis: Unsteadiness on feet (R26.81);Other abnormalities of gait and mobility (R26.89);Muscle weakness (generalized) (M62.81)     Time: 1000-1023 PT Time Calculation (min) (ACUTE ONLY): 23 min  Charges:  $Therapeutic Activity: 23-37 mins                     [address], LPTA; CBIS [phone]  Aldona Lento 06/08/2018, 12:51 PM

## 2018-06-08 NOTE — Progress Notes (Signed)
O2 Sat 93% on RA at Rest (Obese). O2 Sat 99% on RA  With  Ambulation. Assist x 2.

## 2018-06-08 NOTE — Plan of Care (Signed)

## 2018-06-08 NOTE — TOC Transition Note (Signed)
Transition of Care [facility]) - CM/SW Discharge Note   Patient Details  Name: Rachel Morgan MRN: [id number] Date of Birth: [birth year]  Transition of Care [facility]) CM/SW Contact:  Sherald Barge, RN Phone Number: 06/08/2018, 1:49 PM   Clinical Narrative:   DC summary faxed to [place]. EMS transport scheduled. DC plan discussed with sister at bedside this AM.     Final next level of care: Home w Home Health Services Barriers to Discharge: No Barriers Identified   Patient Goals and CMS Choice Patient states their goals for this hospitalization and ongoing recovery are:: Patient/family wishes for pt to go back to sisters home with [facility].        Name of family member notified: [name] - Sister Patient and family notified of of transfer: 06/08/18            DME Arranged: Oxygen, Other see comment(lift) DME Agency: [facility] [place] Date DME Agency Contacted: 06/05/18 Time DME Agency Contacted: [phone] Representative spoke with at DME Agency: [place]: RN, PT, Nurse's Aide [place] Agency: [place] Date [place]: 06/08/18 Time [place]: 1349 Representative spoke with at [place]: Amy   Readmission Risk Interventions Readmission Risk Prevention Plan 06/03/2018 05/11/2018 05/11/2018  Transportation Screening Complete - -  PCP or Specialist Appt within 3-5 Days - - -  HRI or [place] Complete - -  Social Work Consult for [place] Planning/Counseling Complete - -  [place] Screening Not Applicable - -  Medication Review Press photographer) Complete - -  PCP or Specialist appointment within 3-5 days of discharge - Complete Complete  HRI or [place] - Complete Complete  SW Recovery Care/Counseling Consult - Complete Complete  Palliative Care Screening - Not Applicable Not Applicable  Skilled Nursing Facility - Not Applicable Not Applicable  Some recent data might be hidden

## 2018-06-08 NOTE — Care Management Important Message (Signed)
Important Message  Patient Details  Name: Rachel Morgan MRN: [id number] Date of Birth: October 16, 1950   Medicare Important Message Given:  Yes    Tommy Medal 06/08/2018, 3:26 PM

## 2018-06-12 ENCOUNTER — Other Ambulatory Visit: Payer: Self-pay

## 2018-06-12 ENCOUNTER — Encounter (HOSPITAL_COMMUNITY): Payer: Self-pay | Admitting: Emergency Medicine

## 2018-06-12 ENCOUNTER — Emergency Department (HOSPITAL_COMMUNITY): Payer: Medicare Other

## 2018-06-12 ENCOUNTER — Inpatient Hospital Stay (HOSPITAL_COMMUNITY)
Admission: EM | Admit: 2018-06-12 | Discharge: 2018-06-17 | DRG: 871 | Disposition: A | Discharge status: Home Health | Payer: Medicare Other | Attending: Family Medicine | Admitting: Family Medicine

## 2018-06-12 DIAGNOSIS — E875 Hyperkalemia: Secondary | ICD-10-CM | POA: Diagnosis present

## 2018-06-12 DIAGNOSIS — A4151 Sepsis due to Escherichia coli [E. coli]: Secondary | ICD-10-CM

## 2018-06-12 DIAGNOSIS — Z883 Allergy status to other anti-infective agents status: Secondary | ICD-10-CM

## 2018-06-12 DIAGNOSIS — I130 Hypertensive heart and chronic kidney disease with heart failure and stage 1 through stage 4 chronic kidney disease, or unspecified chronic kidney disease: Secondary | ICD-10-CM | POA: Diagnosis present

## 2018-06-12 DIAGNOSIS — E1122 Type 2 diabetes mellitus with diabetic chronic kidney disease: Secondary | ICD-10-CM | POA: Diagnosis present

## 2018-06-12 DIAGNOSIS — F419 Anxiety disorder, unspecified: Secondary | ICD-10-CM | POA: Diagnosis present

## 2018-06-12 DIAGNOSIS — Z7982 Long term (current) use of aspirin: Secondary | ICD-10-CM

## 2018-06-12 DIAGNOSIS — G809 Cerebral palsy, unspecified: Secondary | ICD-10-CM | POA: Diagnosis present

## 2018-06-12 DIAGNOSIS — R34 Anuria and oliguria: Secondary | ICD-10-CM | POA: Diagnosis not present

## 2018-06-12 DIAGNOSIS — Z87442 Personal history of urinary calculi: Secondary | ICD-10-CM

## 2018-06-12 DIAGNOSIS — I10 Essential (primary) hypertension: Secondary | ICD-10-CM | POA: Diagnosis present

## 2018-06-12 DIAGNOSIS — J9601 Acute respiratory failure with hypoxia: Secondary | ICD-10-CM

## 2018-06-12 DIAGNOSIS — A4159 Other Gram-negative sepsis: Secondary | ICD-10-CM | POA: Diagnosis not present

## 2018-06-12 DIAGNOSIS — R410 Disorientation, unspecified: Secondary | ICD-10-CM

## 2018-06-12 DIAGNOSIS — G9341 Metabolic encephalopathy: Secondary | ICD-10-CM | POA: Diagnosis present

## 2018-06-12 DIAGNOSIS — Z79890 Hormone replacement therapy (postmenopausal): Secondary | ICD-10-CM

## 2018-06-12 DIAGNOSIS — N3001 Acute cystitis with hematuria: Secondary | ICD-10-CM | POA: Diagnosis present

## 2018-06-12 DIAGNOSIS — R32 Unspecified urinary incontinence: Secondary | ICD-10-CM | POA: Diagnosis not present

## 2018-06-12 DIAGNOSIS — K219 Gastro-esophageal reflux disease without esophagitis: Secondary | ICD-10-CM | POA: Diagnosis present

## 2018-06-12 DIAGNOSIS — Z6841 Body Mass Index (BMI) 40.0 and over, adult: Secondary | ICD-10-CM | POA: Diagnosis not present

## 2018-06-12 DIAGNOSIS — Z79899 Other long term (current) drug therapy: Secondary | ICD-10-CM | POA: Diagnosis not present

## 2018-06-12 DIAGNOSIS — R6520 Severe sepsis without septic shock: Secondary | ICD-10-CM | POA: Diagnosis present

## 2018-06-12 DIAGNOSIS — N179 Acute kidney failure, unspecified: Secondary | ICD-10-CM | POA: Diagnosis present

## 2018-06-12 DIAGNOSIS — I5032 Chronic diastolic (congestive) heart failure: Secondary | ICD-10-CM | POA: Diagnosis present

## 2018-06-12 DIAGNOSIS — F79 Unspecified intellectual disabilities: Secondary | ICD-10-CM | POA: Diagnosis present

## 2018-06-12 DIAGNOSIS — I5033 Acute on chronic diastolic (congestive) heart failure: Secondary | ICD-10-CM

## 2018-06-12 DIAGNOSIS — E039 Hypothyroidism, unspecified: Secondary | ICD-10-CM | POA: Diagnosis present

## 2018-06-12 DIAGNOSIS — N170 Acute kidney failure with tubular necrosis: Secondary | ICD-10-CM | POA: Diagnosis present

## 2018-06-12 DIAGNOSIS — N184 Chronic kidney disease, stage 4 (severe): Secondary | ICD-10-CM | POA: Diagnosis present

## 2018-06-12 DIAGNOSIS — B961 Klebsiella pneumoniae [K. pneumoniae] as the cause of diseases classified elsewhere: Secondary | ICD-10-CM | POA: Diagnosis present

## 2018-06-12 DIAGNOSIS — D631 Anemia in chronic kidney disease: Secondary | ICD-10-CM | POA: Diagnosis present

## 2018-06-12 DIAGNOSIS — N139 Obstructive and reflux uropathy, unspecified: Secondary | ICD-10-CM | POA: Diagnosis present

## 2018-06-12 DIAGNOSIS — N390 Urinary tract infection, site not specified: Secondary | ICD-10-CM | POA: Diagnosis present

## 2018-06-12 DIAGNOSIS — N183 Chronic kidney disease, stage 3 unspecified: Secondary | ICD-10-CM

## 2018-06-12 DIAGNOSIS — A419 Sepsis, unspecified organism: Secondary | ICD-10-CM | POA: Diagnosis present

## 2018-06-12 DIAGNOSIS — Z1159 Encounter for screening for other viral diseases: Secondary | ICD-10-CM | POA: Diagnosis not present

## 2018-06-12 DIAGNOSIS — Z880 Allergy status to penicillin: Secondary | ICD-10-CM

## 2018-06-12 LAB — COMPREHENSIVE METABOLIC PANEL
ALT: 22 U/L (ref 0–44)
AST: 27 U/L (ref 15–41)
Albumin: 3.3 g/dL — ABNORMAL LOW (ref 3.5–5.0)
Alkaline Phosphatase: 80 U/L (ref 38–126)
Anion gap: 17 — ABNORMAL HIGH (ref 5–15)
BUN: 48 mg/dL — ABNORMAL HIGH (ref 8–23)
CO2: 22 mmol/L (ref 22–32)
Calcium: 9.1 mg/dL (ref 8.9–10.3)
Chloride: 100 mmol/L (ref 98–111)
Creatinine, Ser: 3.18 mg/dL — ABNORMAL HIGH (ref 0.44–1.00)
GFR calc Af Amer: 17 mL/min — ABNORMAL LOW (ref >60)
GFR calc non Af Amer: 14 mL/min — ABNORMAL LOW (ref >60)
Glucose, Bld: 182 mg/dL — ABNORMAL HIGH (ref 70–99)
Potassium: 5.5 mmol/L — ABNORMAL HIGH (ref 3.5–5.1)
Sodium: 139 mmol/L (ref 135–145)
Total Bilirubin: 0.6 mg/dL (ref 0.3–1.2)
Total Protein: 7 g/dL (ref 6.5–8.1)

## 2018-06-12 LAB — URINALYSIS, ROUTINE W REFLEX MICROSCOPIC
Bilirubin Urine: NEGATIVE
Glucose, UA: NEGATIVE mg/dL
Ketones, ur: NEGATIVE mg/dL
Nitrite: NEGATIVE
Protein, ur: 100 mg/dL — AB
Specific Gravity, Urine: 1.025 (ref 1.005–1.030)
pH: 7.5 (ref 5.0–8.0)

## 2018-06-12 LAB — CBC WITH DIFFERENTIAL/PLATELET
Abs Immature Granulocytes: 0.09 10*3/uL — ABNORMAL HIGH (ref 0.00–0.07)
Basophils Absolute: 0.1 10*3/uL (ref 0.0–0.1)
Basophils Relative: 1 %
Eosinophils Absolute: 0 10*3/uL (ref 0.0–0.5)
Eosinophils Relative: 0 %
HCT: 35.1 % — ABNORMAL LOW (ref 36.0–46.0)
Hemoglobin: 10.9 g/dL — ABNORMAL LOW (ref 12.0–15.0)
Immature Granulocytes: 1 %
Lymphocytes Relative: 13 %
Lymphs Abs: 1.5 10*3/uL (ref 0.7–4.0)
MCH: 30 pg (ref 26.0–34.0)
MCHC: 31.1 g/dL (ref 30.0–36.0)
MCV: 96.7 fL (ref 80.0–100.0)
Monocytes Absolute: 0.9 10*3/uL (ref 0.1–1.0)
Monocytes Relative: 8 %
Neutro Abs: 8.5 10*3/uL — ABNORMAL HIGH (ref 1.7–7.7)
Neutrophils Relative %: 77 %
Platelets: 352 10*3/uL (ref 150–400)
RBC: 3.63 MIL/uL — ABNORMAL LOW (ref 3.87–5.11)
RDW: 14.3 % (ref 11.5–15.5)
WBC: 11 10*3/uL — ABNORMAL HIGH (ref 4.0–10.5)
nRBC: 0 % (ref 0.0–0.2)

## 2018-06-12 LAB — CBG MONITORING, ED: Glucose-Capillary: 121 mg/dL — ABNORMAL HIGH (ref 70–99)

## 2018-06-12 LAB — TROPONIN I: Troponin I: <0.03 ng/mL (ref <0.03)

## 2018-06-12 LAB — URINALYSIS, MICROSCOPIC (REFLEX): WBC, UA: >50 WBC/hpf (ref 0–5)

## 2018-06-12 LAB — BRAIN NATRIURETIC PEPTIDE: B Natriuretic Peptide: 70 pg/mL (ref 0.0–100.0)

## 2018-06-12 LAB — LACTIC ACID, PLASMA
Lactic Acid, Venous: 2.1 mmol/L (ref 0.5–1.9)
Lactic Acid, Venous: 1.1 mmol/L (ref 0.5–1.9)

## 2018-06-12 LAB — GLUCOSE, CAPILLARY: Glucose-Capillary: 144 mg/dL — ABNORMAL HIGH (ref 70–99)

## 2018-06-12 LAB — MRSA PCR SCREENING: MRSA by PCR: NEGATIVE

## 2018-06-12 LAB — SARS CORONAVIRUS 2 BY RT PCR (HOSPITAL ORDER, PERFORMED IN ~~LOC~~ HOSPITAL LAB): SARS Coronavirus 2: NEGATIVE

## 2018-06-12 MED ORDER — LEVOFLOXACIN IN D5W 500 MG/100ML IV SOLN
500.0000 mg | INTRAVENOUS | Status: DC
  Administered 2018-06-14 – 2018-06-16 (×2): 500 mg via INTRAVENOUS
  Filled 2018-06-12 (×2): qty 100

## 2018-06-12 MED ORDER — LACTATED RINGERS IV SOLN
INTRAVENOUS | Status: DC
  Administered 2018-06-12 – 2018-06-14 (×5): via INTRAVENOUS

## 2018-06-12 MED ORDER — OXYBUTYNIN CHLORIDE ER 5 MG PO TB24
10.0000 mg | ORAL_TABLET | Freq: Every morning | ORAL | Status: DC
  Administered 2018-06-13 – 2018-06-17 (×5): 10 mg via ORAL
  Filled 2018-06-12 (×8): qty 2

## 2018-06-12 MED ORDER — PANTOPRAZOLE SODIUM 40 MG PO TBEC
40.0000 mg | DELAYED_RELEASE_TABLET | Freq: Every day | ORAL | Status: DC
  Administered 2018-06-13 – 2018-06-17 (×5): 40 mg via ORAL
  Filled 2018-06-12 (×5): qty 1

## 2018-06-12 MED ORDER — ORAL CARE MOUTH RINSE
15.0000 mL | Freq: Two times a day (BID) | OROMUCOSAL | Status: DC
  Administered 2018-06-13 – 2018-06-17 (×9): 15 mL via OROMUCOSAL

## 2018-06-12 MED ORDER — ASPIRIN EC 81 MG PO TBEC
81.0000 mg | DELAYED_RELEASE_TABLET | Freq: Every day | ORAL | Status: DC
  Administered 2018-06-13 – 2018-06-17 (×5): 81 mg via ORAL
  Filled 2018-06-12 (×5): qty 1

## 2018-06-12 MED ORDER — ONDANSETRON HCL 4 MG PO TABS: 4.0000 mg | ORAL_TABLET | Freq: Four times a day (QID) | ORAL | Status: DC | PRN

## 2018-06-12 MED ORDER — ONDANSETRON HCL 4 MG/2ML IJ SOLN: 4.0000 mg | Freq: Four times a day (QID) | INTRAMUSCULAR | Status: DC | PRN

## 2018-06-12 MED ORDER — INSULIN ASPART 100 UNIT/ML ~~LOC~~ SOLN
0.0000 [IU] | SUBCUTANEOUS | Status: DC
  Administered 2018-06-12 (×2): 1 [IU] via SUBCUTANEOUS
  Filled 2018-06-12: qty 1

## 2018-06-12 MED ORDER — SODIUM CHLORIDE 0.9 % IV BOLUS
2000.0000 mL | Freq: Once | INTRAVENOUS | Status: AC
  Administered 2018-06-12: 2000 mL via INTRAVENOUS

## 2018-06-12 MED ORDER — PANTOPRAZOLE SODIUM 40 MG PO TBEC
DELAYED_RELEASE_TABLET | ORAL | Status: AC
  Filled 2018-06-12: qty 1

## 2018-06-12 MED ORDER — HEPARIN SODIUM (PORCINE) 5000 UNIT/ML IJ SOLN
5000.0000 [IU] | Freq: Three times a day (TID) | INTRAMUSCULAR | Status: DC
  Administered 2018-06-13 – 2018-06-17 (×13): 5000 [IU] via SUBCUTANEOUS
  Filled 2018-06-12 (×13): qty 1

## 2018-06-12 MED ORDER — ACETAMINOPHEN 325 MG PO TABS
650.0000 mg | ORAL_TABLET | Freq: Four times a day (QID) | ORAL | Status: DC | PRN
  Administered 2018-06-14 – 2018-06-17 (×3): 650 mg via ORAL
  Filled 2018-06-12 (×3): qty 2

## 2018-06-12 MED ORDER — ACETAMINOPHEN 650 MG RE SUPP: 650.0000 mg | Freq: Four times a day (QID) | RECTAL | Status: DC | PRN

## 2018-06-12 MED ORDER — LEVOFLOXACIN IN D5W 500 MG/100ML IV SOLN
500.0000 mg | Freq: Once | INTRAVENOUS | Status: AC
  Administered 2018-06-12: 500 mg via INTRAVENOUS
  Filled 2018-06-12: qty 100

## 2018-06-12 MED ORDER — SODIUM ZIRCONIUM CYCLOSILICATE 5 G PO PACK
10.0000 g | PACK | Freq: Once | ORAL | Status: AC
  Administered 2018-06-12: 10 g via ORAL
  Filled 2018-06-12: qty 2

## 2018-06-12 MED ORDER — LEVOTHYROXINE SODIUM 25 MCG PO TABS
50.0000 ug | ORAL_TABLET | Freq: Every day | ORAL | Status: DC
  Administered 2018-06-13 – 2018-06-17 (×5): 50 ug via ORAL
  Filled 2018-06-12 (×5): qty 2

## 2018-06-12 NOTE — H&P (Signed)
History and Physical    KAJSA BUTRUM KGM:[id number] DOB: 1950/05/24 DOA: 06/12/2018  PCP: [facility]   Patient coming from: Home  Chief Complaint: Altered mental status  HPI: ALEXANDRYA CHIM is a 68 y.o. female with medical history significant for morbid obesity, MR, hypertension, hypothyroidism, CKD stage III, chronic diastolic heart failure with EF 60%, GERD, nephrolithiasis with prior stent, and anxiety who was brought to the ED with some altered mental status which occurred earlier this morning.  This was accompanied with some epigastric abdominal pain.  Her sister is a primary historian at the bedside and states that she was also noted to have some mild nausea.  She had an episode of emesis in the emergency department.  Of note, patient was recently discharged on 6/1 after treatment for acute on chronic diastolic heart failure.  She was previously noted to have nephrolithiasis and underwent ureteral stent placement as well.  No symptoms of dysuria or hematuria noted.   ED Course: Patient initially arrived with systolic blood pressures in the 60s and she received 2 L of fluid bolus with systolic now approximately 80.  She is noted to have a lactic acid level of 2.1.  She has been started on Levaquin with blood and urine cultures ordered.  Laboratory data with leukocytosis of 11,000 and hemoglobin 10.9.  Potassium 5.5 noted.  BUN 48 and creatinine 3.18 with baseline creatinine 1.8-2.  Blood glucose noted to be 182.  Review of Systems: Cannot be obtained given patient condition.  Past Medical History:  Diagnosis Date  . Anxiety   . Calculus of gallbladder   . Cerebral palsy ([place])   . Chronic kidney disease   . Diabetes mellitus   . Gall stones   . GERD (gastroesophageal reflux disease)   . High cholesterol   . History of kidney stones   . Hypertension   . Hypothyroidism   . Kidney stones   . MR (mental retardation)   . Pyelonephritis   . Sepsis [facility])      Past Surgical History:  Procedure Laterality Date  . CYSTOSCOPY W/ URETERAL STENT PLACEMENT Bilateral 09/26/2015   Procedure: CYSTOSCOPY WITH Bilateral  RETROGRADE PYELOGRAM/URETERAL STENT PLACEMENT;  Surgeon: [name], MD;  Location: [facility];  Service: Urology;  Laterality: Bilateral;  . CYSTOSCOPY W/ URETERAL STENT PLACEMENT Right 04/22/2018   Procedure: CYSTOSCOPY WITH RETROGRADE PYELOGRAM/URETERAL STENT PLACEMENT;  Surgeon: Cleon Gustin, MD;  Location: [facility];  Service: Urology;  Laterality: Right;  . CYSTOSCOPY W/ URETERAL STENT REMOVAL Left 11/10/2015   Procedure: CYSTOSCOPY WITH STENT REMOVAL;  Surgeon: Cleon Gustin, MD;  Location: [facility];  Service: Urology;  Laterality: Left;  . CYSTOSCOPY WITH STENT PLACEMENT Right 01/21/2016   Procedure: CYSTOSCOPY WITH STENT PLACEMENT;  Surgeon: Franchot Gallo, MD;  Location: [facility];  Service: Urology;  Laterality: Right;  . CYSTOSCOPY/RETROGRADE/URETEROSCOPY/STONE EXTRACTION WITH BASKET Left 10/31/2015   Procedure: CYSTOSCOPY/ BILATERAL URETEROSCOPY/BILATERAL STONE EXTRACTION/ LEFT STENT PLACEMENT;  Surgeon: Irine Seal, MD;  Location: [facility];  Service: Urology;  Laterality: Left;  . CYSTOSCOPY/URETEROSCOPY/HOLMIUM LASER/STENT PLACEMENT Right 02/27/2016   Procedure: CYSTOSCOPY/RIGHT URETEROSCOPY/HOLMIUM LASER/STENT PLACEMENT/STENT REMOVAL;  Surgeon: Irine Seal, MD;  Location: [facility];  Service: Urology;  Laterality: Right;  . HOLMIUM LASER APPLICATION N/A 64/40/3474   Procedure: HOLMIUM LASER APPLICATION;  Surgeon: Irine Seal, MD;  Location: [facility];  Service: Urology;  Laterality: N/A;  . IR CV LINE INJECTION  05/06/2018  . IR FLUORO GUIDE PORT INSERTION RIGHT  04/10/2016  . IR US GUIDE VASC ACCESS RIGHT  04/10/2016     reports that she has never smoked. She has never used smokeless tobacco. She reports that she does not drink alcohol or use drugs.  Allergies  Allergen Reactions  . Macrobid [Nitrofurantoin Monohyd Macro] Hives and  Swelling  . Penicillins Swelling and Rash    Has patient had a PCN reaction causing immediate rash, facial/tongue/throat swelling, SOB or lightheadedness with hypotension: Yes Has patient had a PCN reaction causing severe rash involving mucus membranes or skin necrosis: Yes Has patient had a PCN reaction that required hospitalization: no Has patient had a PCN reaction occurring within the last 10 years: no If all of the above answers are "NO", then may proceed with Cephalosporin use. Patient tolerated rocephin and ertapenem in the past    Family History  Family history unknown: Yes    Prior to Admission medications   Medication Sig Start Date End Date Taking? Authorizing Provider  acetaminophen (TYLENOL) 325 MG tablet Take 2 tablets (650 mg total) by mouth every 6 (six) hours as needed for mild pain (or Fever >/= 101). 06/08/18   Roxan Hockey, MD  ALPRAZolam Duanne Moron) 1 MG tablet Take 1 mg by mouth 2 (two) times daily.  04/09/18   [provider]  aspirin EC 81 MG tablet Take 1 tablet (81 mg total) by mouth daily with breakfast. 06/08/18   Emokpae, Courage, MD  busPIRone (BUSPAR) 10 MG tablet Take 10 mg by mouth 2 (two) times daily.    [provider]  escitalopram (LEXAPRO) 20 MG tablet Take 20 mg by mouth 2 (two) times a day.     [provider]  levothyroxine (SYNTHROID, LEVOTHROID) 50 MCG tablet Take 50 mcg by mouth daily before breakfast.    [provider]  OLANZapine (ZYPREXA) 20 MG tablet Take 20 mg by mouth at bedtime.    [provider]  omeprazole (PRILOSEC) 20 MG capsule Take 20 mg by mouth daily before breakfast.     [provider]  oxybutynin (DITROPAN-XL) 10 MG 24 hr tablet Take 10 mg by mouth every morning.     [provider]  potassium chloride SA (K-DUR) 20 MEQ tablet Take 2 tablets (40 mEq total) by mouth daily. 06/09/18   Roxan Hockey, MD  senna-docusate (SENOKOT-S) 8.6-50 MG tablet Take 1 tablet by mouth 2  (two) times daily. Patient taking differently: Take 1 tablet by mouth 2 (two) times daily as needed.  01/26/16   Raiford Noble Latif, DO  sodium bicarbonate 650 MG tablet Take 1 tablet (650 mg total) by mouth daily. 06/08/18   Roxan Hockey, MD  torsemide (DEMADEX) 20 MG tablet Take 1 tablet (20 mg total) by mouth 2 (two) times daily. For Fluid 06/08/18   Roxan Hockey, MD  zolpidem (AMBIEN) 5 MG tablet Take 1 tablet (5 mg total) by mouth at bedtime as needed for sleep. 06/08/18   Roxan Hockey, MD    Physical Exam: Vitals:   06/12/18 1236 06/12/18 1319 06/12/18 1330 06/12/18 1345  BP:   (!) 80/41   Pulse:   70 66  Resp:   (!) 28 (!) 27  Temp:  98.2 F (36.8 C)    TempSrc:  Rectal    SpO2:   96% 95%  Weight: 117 kg     Height: 4\' 11"  (1.499 m)       Constitutional: NAD, calm, comfortable, morbidly obese with MR Vitals:   06/12/18 1236 06/12/18 1319 06/12/18  1330 06/12/18 1345  BP:   (!) 80/41   Pulse:   70 66  Resp:   (!) 28 (!) 27  Temp:  98.2 F (36.8 C)    TempSrc:  Rectal    SpO2:   96% 95%  Weight: 117 kg     Height: 4\' 11"  (1.499 m)      Eyes: lids and conjunctivae normal ENMT: Mucous membranes are moist.  Neck: normal, supple Respiratory: clear to auscultation bilaterally. Normal respiratory effort. No accessory muscle use.  Cardiovascular: Regular rate and rhythm, no murmurs. No extremity edema.  Port to right chest wall Abdomen: no tenderness, no distention. Bowel sounds positive.  Musculoskeletal:  No joint deformity upper and lower extremities.   Skin: no rashes, lesions, ulcers.  Psychiatric: Difficult to assess.  Labs on Admission: I have personally reviewed following labs and imaging studies  CBC: Recent Labs  Lab 06/12/18 1334  WBC 11.0*  NEUTROABS 8.5*  HGB 10.9*  HCT 35.1*  MCV 96.7  PLT 540   Basic Metabolic Panel: Recent Labs  Lab 06/06/18 0601 06/07/18 0612 06/08/18 0550 06/12/18 1334  NA 139 138 137 139  K 4.3 4.1 4.3 5.5*  CL  94* 93* 91* 100  CO2 32 30 32 22  GLUCOSE 109* 123* 126* 182*  BUN 33* 39* 41* 48*  CREATININE 2.50* 2.34* 2.62* 3.18*  CALCIUM 8.9 9.0 9.2 9.1  MG 1.6*  --   --   --    GFR: Estimated Creatinine Clearance: 20 mL/min (A) (by C-G formula based on SCr of 3.18 mg/dL (H)). Liver Function Tests: Recent Labs  Lab 06/12/18 1334  AST 27  ALT 22  ALKPHOS 80  BILITOT 0.6  PROT 7.0  ALBUMIN 3.3*   No results for input(s): LIPASE, AMYLASE in the last 168 hours. No results for input(s): AMMONIA in the last 168 hours. Coagulation Profile: No results for input(s): INR, PROTIME in the last 168 hours. Cardiac Enzymes: Recent Labs  Lab 06/12/18 1334  TROPONINI <0.03   BNP (last 3 results) No results for input(s): PROBNP in the last 8760 hours. HbA1C: No results for input(s): HGBA1C in the last 72 hours. CBG: Recent Labs  Lab 06/06/18 0742 06/07/18 0720  GLUCAP 123* 119*   Lipid Profile: No results for input(s): CHOL, HDL, LDLCALC, TRIG, CHOLHDL, LDLDIRECT in the last 72 hours. Thyroid Function Tests: No results for input(s): TSH, T4TOTAL, FREET4, T3FREE, THYROIDAB in the last 72 hours. Anemia Panel: No results for input(s): VITAMINB12, FOLATE, FERRITIN, TIBC, IRON, RETICCTPCT in the last 72 hours. Urine analysis:    Component Value Date/Time   COLORURINE YELLOW 06/12/2018 1247   APPEARANCEUR CLOUDY (A) 06/12/2018 1247   LABSPEC 1.025 06/12/2018 1247   PHURINE 7.5 06/12/2018 1247   GLUCOSEU NEGATIVE 06/12/2018 1247   HGBUR MODERATE (A) 06/12/2018 1247   BILIRUBINUR NEGATIVE 06/12/2018 [place] 06/12/2018 1247   PROTEINUR 100 (A) 06/12/2018 1247   UROBILINOGEN 1.0 02/11/2011 2035   NITRITE NEGATIVE 06/12/2018 1247   LEUKOCYTESUR LARGE (A) 06/12/2018 1247    Radiological Exams on Admission: Dg Chest Portable 1 View  Result Date: 06/12/2018 CLINICAL DATA:  Two episodes of loss of consciousness. EXAM: PORTABLE CHEST 1 VIEW COMPARISON:  06/07/2018 FINDINGS:  The right IJ power port is stable. The heart is enlarged but unchanged. The mediastinal and hilar contours are prominent but unchanged. The lungs demonstrate slight improved aeration with resolving edema and atelectasis. No obvious pleural effusions. The bony thorax is intact.  IMPRESSION: Slight improved lung aeration with resolving edema and atelectasis. No definite pleural effusions. Electronically Signed   By: Marijo Sanes M.D.   On: 06/12/2018 13:06    Assessment/Plan Principal Problem:   Sepsis ([place]) Active Problems:   Hypothyroidism   Hypertension   GERD (gastroesophageal reflux disease)   Hyperkalemia   UTI (urinary tract infection)   Acute renal failure superimposed on stage 3 chronic kidney disease ([place])   Morbid obesity with BMI of 50.0-59.9, adult ([place])    Severe sepsis secondary to UTI -Blood and urine cultures collected, will follow -Maintain on IV fluid, gentle in the setting of CHF.  2 L given in ED -Monitor repeat lactic acid levels -IV Levaquin -Avoid blood pressure agents  AKI on CKD stage III secondary to above -Avoid nephrotoxic agents -Repeat renal panel in a.m. -Monitor strict I's and O's  Hyperkalemia secondary to AKI -Administer dose of Lokelma and repeat labs in a.m.  Acute encephalopathy -Difficult to assess in the setting of MR, but will monitor with neurochecks in stepdown unit -Secondary to sepsis  Nausea and vomiting -Zofran as needed -Clear liquid diet for now and advance as tolerated  Chronic diastolic heart failure -Currently without any acute symptomatology -Monitor daily weights with recent weight on discharge 6/1 117 kg, current weight appears to be the same  GERD -PPI  Hypothyroidism -Synthroid  Hypertension -Currently with soft blood pressure readings; avoid antihypertensives  MR -Sister at bedside to assist with history  Morbid obesity -Lifestyle changes   DVT prophylaxis: Heparin Code Status: Full code Family  Communication: Sister, [name] at bedside Disposition Plan: Admit for treatment of sepsis and UTI Consults called: None Admission status: Inpatient, stepdown   Gena Laski Darleen Crocker DO [facility] Pager [phone]  If 7PM-7AM, please contact night-coverage [email or web address] Password [email or web address]  06/12/2018, 2:59 PM

## 2018-06-12 NOTE — ED Triage Notes (Signed)
Per EMS. Pt's family reported pt had " two episodes of LOC and back to her baseline".  Pt c/o of abdomen pain.

## 2018-06-12 NOTE — Progress Notes (Signed)
Pharmacy Antibiotic Note  Rachel Morgan is a 68 y.o. female admitted on 06/12/2018 with UTI.  Pharmacy has been consulted for Levaquin dosing.  Plan: Levaquin 500 mg IV every 48 hours. Monitor labs, c/s, and patient improvement.  Height: 4\' 11"  (149.9 cm) Weight: 258 lb (117 kg) IBW/kg (Calculated) : 43.2  Temp (24hrs), Avg:98 F (36.7 C), Min:97.8 F (36.6 C), Max:98.2 F (36.8 C)  Recent Labs  Lab 06/06/18 0601 06/07/18 0612 06/08/18 0550 06/12/18 1334 06/12/18 1335 06/12/18 1511  WBC  --   --   --  11.0*  --   --   CREATININE 2.50* 2.34* 2.62* 3.18*  --   --   LATICACIDVEN  --   --   --   --  2.1* 1.1    Estimated Creatinine Clearance: 20 mL/min (A) (by C-G formula based on SCr of 3.18 mg/dL (H)).    Allergies  Allergen Reactions  . Macrobid [Nitrofurantoin Monohyd Macro] Hives and Swelling  . Penicillins Swelling and Rash    Has patient had a PCN reaction causing immediate rash, facial/tongue/throat swelling, SOB or lightheadedness with hypotension: Yes Has patient had a PCN reaction causing severe rash involving mucus membranes or skin necrosis: Yes Has patient had a PCN reaction that required hospitalization: no Has patient had a PCN reaction occurring within the last 10 years: no If all of the above answers are "NO", then may proceed with Cephalosporin use. Patient tolerated rocephin and ertapenem in the past    Antimicrobials this admission: Levaquin 6/5 >>      Dose adjustments this admission: [place]  Microbiology results: 6/5 BCx: pending 6/5 UCx: pending    Thank you for allowing pharmacy to be a part of this patient's care.  Ramond Craver 06/12/2018 3:52 PM

## 2018-06-12 NOTE — ED Notes (Signed)
Date and time results received: 06/12/18 2:28 PM (use smartphrase ".now" to insert current time)  Test: Lactic acid Critical Value: 2.5  Name of Provider Notified: [place]  Orders Received? Or Actions Taken?: Orders Received - See Orders for details

## 2018-06-12 NOTE — ED Notes (Signed)
ICU to give Heparin at 2200.

## 2018-06-12 NOTE — ED Provider Notes (Signed)
[facility] EMERGENCY DEPARTMENT Provider Note   CSN: [id number] Arrival date & time: 06/12/18  1213    History   Chief Complaint Chief Complaint  Patient presents with  . Altered Mental Status    HPI Rachel Morgan is a 68 y.o. female.     Patient presents with weakness.  She has a history of mental retardation and her sister take care of her and said that she was very weak today  The history is provided by a relative. No language interpreter was used.  Weakness  Severity:  Moderate Onset quality:  Sudden Timing:  Constant Progression:  Worsening Chronicity:  New Context: not alcohol use   Relieved by:  Nothing Worsened by:  Nothing Ineffective treatments:  None tried Associated symptoms: no abdominal pain     Past Medical History:  Diagnosis Date  . Anxiety   . Calculus of gallbladder   . Cerebral palsy ([place])   . Chronic kidney disease   . Diabetes mellitus   . Gall stones   . GERD (gastroesophageal reflux disease)   . High cholesterol   . History of kidney stones   . Hypertension   . Hypothyroidism   . Kidney stones   . MR (mental retardation)   . Pyelonephritis   . Sepsis [facility])     Patient Active Problem List   Diagnosis Date Noted  . Acute renal failure superimposed on stage 3 chronic kidney disease ([place]) 06/03/2018  . Acute on chronic diastolic CHF (congestive heart failure) ([place]) 06/03/2018  . Acute respiratory failure with hypoxia ([place]) 06/03/2018  . Hypokalemia 06/03/2018  . Morbid obesity with BMI of 50.0-59.9, adult ([place]) 06/03/2018  . Acute exacerbation of CHF (congestive heart failure) ([place]) 06/01/2018  . Hypoxia 06/01/2018  . Pneumonia 05/09/2018  . Acute respiratory failure ([place]) 05/07/2018  . Acute metabolic encephalopathy 38/45/3646  . Hypomagnesemia 04/21/2018  . UTI (urinary tract infection) 04/20/2018  . PICC (peripherally inserted central catheter) in place   . Urinary tract infection without hematuria   . Hydronephrosis  with renal and ureteral calculus obstruction 01/21/2016  . Hyperkalemia 01/21/2016  . Palliative care encounter   . DNR (do not resuscitate) discussion   . Encephalopathy 10/17/2015  . Goals of care, counseling/discussion 10/17/2015  . Nephrolithiasis 09/27/2015  . Cerebral palsy ([place]) 09/27/2015  . Sepsis ([place]) 09/26/2015  . AKI (acute kidney injury) ([place]) 09/26/2015  . Fever   . Urinary tract infectious disease   . Dilated cbd, acquired 09/14/2015  . Hypothyroidism 09/14/2015  . Hypertension 09/14/2015  . Depression with anxiety 09/14/2015  . UPJ obstruction, acquired 09/14/2015  . CKD (chronic kidney disease), stage III ([place]) 09/14/2015  . GERD (gastroesophageal reflux disease) 09/14/2015  . Calculus of gallbladder without cholecystitis without obstruction   . Abdominal pain, right upper quadrant 02/11/2011  . Acute pyelonephritis 02/11/2011  . Agoraphobia 02/11/2011  . Acute renal failure ([place]) 02/11/2011  . Hyponatremia 02/11/2011  . Dehydration 02/11/2011    Past Surgical History:  Procedure Laterality Date  . CYSTOSCOPY W/ URETERAL STENT PLACEMENT Bilateral 09/26/2015   Procedure: CYSTOSCOPY WITH Bilateral  RETROGRADE PYELOGRAM/URETERAL STENT PLACEMENT;  Surgeon: [name], MD;  Location: [facility];  Service: Urology;  Laterality: Bilateral;  . CYSTOSCOPY W/ URETERAL STENT PLACEMENT Right 04/22/2018   Procedure: CYSTOSCOPY WITH RETROGRADE PYELOGRAM/URETERAL STENT PLACEMENT;  Surgeon: Cleon Gustin, MD;  Location: [facility];  Service: Urology;  Laterality: Right;  . CYSTOSCOPY W/ URETERAL STENT REMOVAL Left 11/10/2015   Procedure: CYSTOSCOPY WITH STENT  REMOVAL;  Surgeon: Cleon Gustin, MD;  Location: [facility];  Service: Urology;  Laterality: Left;  . CYSTOSCOPY WITH STENT PLACEMENT Right 01/21/2016   Procedure: CYSTOSCOPY WITH STENT PLACEMENT;  Surgeon: Franchot Gallo, MD;  Location: [facility];  Service: Urology;  Laterality: Right;  .  CYSTOSCOPY/RETROGRADE/URETEROSCOPY/STONE EXTRACTION WITH BASKET Left 10/31/2015   Procedure: CYSTOSCOPY/ BILATERAL URETEROSCOPY/BILATERAL STONE EXTRACTION/ LEFT STENT PLACEMENT;  Surgeon: Irine Seal, MD;  Location: [facility];  Service: Urology;  Laterality: Left;  . CYSTOSCOPY/URETEROSCOPY/HOLMIUM LASER/STENT PLACEMENT Right 02/27/2016   Procedure: CYSTOSCOPY/RIGHT URETEROSCOPY/HOLMIUM LASER/STENT PLACEMENT/STENT REMOVAL;  Surgeon: Irine Seal, MD;  Location: [facility];  Service: Urology;  Laterality: Right;  . HOLMIUM LASER APPLICATION N/A 35/32/9924   Procedure: HOLMIUM LASER APPLICATION;  Surgeon: Irine Seal, MD;  Location: [facility];  Service: Urology;  Laterality: N/A;  . IR CV LINE INJECTION  05/06/2018  . IR FLUORO GUIDE PORT INSERTION RIGHT  04/10/2016  . IR US GUIDE VASC ACCESS RIGHT  04/10/2016     OB History   No obstetric history on file.      Home Medications    Prior to Admission medications   Medication Sig Start Date End Date Taking? Authorizing Provider  acetaminophen (TYLENOL) 325 MG tablet Take 2 tablets (650 mg total) by mouth every 6 (six) hours as needed for mild pain (or Fever >/= 101). 06/08/18   Roxan Hockey, MD  ALPRAZolam Duanne Moron) 1 MG tablet Take 1 mg by mouth 2 (two) times daily.  04/09/18   [provider]  aspirin EC 81 MG tablet Take 1 tablet (81 mg total) by mouth daily with breakfast. 06/08/18   Emokpae, Courage, MD  busPIRone (BUSPAR) 10 MG tablet Take 10 mg by mouth 2 (two) times daily.    [provider]  escitalopram (LEXAPRO) 20 MG tablet Take 20 mg by mouth 2 (two) times a day.     [provider]  levothyroxine (SYNTHROID, LEVOTHROID) 50 MCG tablet Take 50 mcg by mouth daily before breakfast.    [provider]  OLANZapine (ZYPREXA) 20 MG tablet Take 20 mg by mouth at bedtime.    [provider]  omeprazole (PRILOSEC) 20 MG capsule Take 20 mg by mouth daily before breakfast.     [provider]  oxybutynin  (DITROPAN-XL) 10 MG 24 hr tablet Take 10 mg by mouth every morning.     [provider]  potassium chloride SA (K-DUR) 20 MEQ tablet Take 2 tablets (40 mEq total) by mouth daily. 06/09/18   Roxan Hockey, MD  senna-docusate (SENOKOT-S) 8.6-50 MG tablet Take 1 tablet by mouth 2 (two) times daily. Patient taking differently: Take 1 tablet by mouth 2 (two) times daily as needed.  01/26/16   Raiford Noble Latif, DO  sodium bicarbonate 650 MG tablet Take 1 tablet (650 mg total) by mouth daily. 06/08/18   Roxan Hockey, MD  torsemide (DEMADEX) 20 MG tablet Take 1 tablet (20 mg total) by mouth 2 (two) times daily. For Fluid 06/08/18   Roxan Hockey, MD  zolpidem (AMBIEN) 5 MG tablet Take 1 tablet (5 mg total) by mouth at bedtime as needed for sleep. 06/08/18   Roxan Hockey, MD    Family History Family History  Family history unknown: Yes    Social History Social History   Tobacco Use  . Smoking status: Never Smoker  . Smokeless tobacco: Never Used  Substance Use Topics  . Alcohol use: No  . Drug use: No     Allergies  Macrobid [nitrofurantoin monohyd macro] and Penicillins   Review of Systems Review of Systems  Unable to perform ROS: Mental status change  Gastrointestinal: Negative for abdominal pain.  Neurological: Positive for weakness.     Physical Exam Updated Vital Signs BP (!) 80/41   Pulse 66   Temp 98.2 F (36.8 C) (Rectal)   Resp (!) 27   Ht 4\' 11"  (1.499 m)   Wt 117 kg   SpO2 95%   BMI 52.11 kg/m   Physical Exam Constitutional:      Appearance: She is well-developed.  HENT:     Head: Normocephalic.     Nose: Nose normal.  Eyes:     General: No scleral icterus.    Conjunctiva/sclera: Conjunctivae normal.  Neck:     Musculoskeletal: Neck supple.     Thyroid: No thyromegaly.  Cardiovascular:     Rate and Rhythm: Normal rate and regular rhythm.     Heart sounds: No murmur. No friction rub. No gallop.   Pulmonary:     Breath sounds: No  stridor. No wheezing or rales.  Chest:     Chest wall: No tenderness.  Abdominal:     General: There is no distension.     Tenderness: There is no abdominal tenderness. There is no rebound.  Musculoskeletal: Normal range of motion.  Lymphadenopathy:     Cervical: No cervical adenopathy.  Skin:    Findings: No erythema or rash.  Neurological:     Mental Status: She is alert.     Motor: No abnormal muscle tone.     Coordination: Coordination normal.     Comments: Oriented to person and this is her normal  Psychiatric:        Behavior: Behavior normal.      ED Treatments / Results  Labs (all labs ordered are listed, but only abnormal results are displayed) Labs Reviewed  LACTIC ACID, PLASMA - Abnormal; Notable for the following components:      Result Value   Lactic Acid, Venous 2.1 (*)    All other components within normal limits  COMPREHENSIVE METABOLIC PANEL - Abnormal; Notable for the following components:   Potassium 5.5 (*)    Glucose, Bld 182 (*)    BUN 48 (*)    Creatinine, Ser 3.18 (*)    Albumin 3.3 (*)    GFR calc non Af Amer 14 (*)    GFR calc Af Amer 17 (*)    Anion gap 17 (*)    All other components within normal limits  CBC WITH DIFFERENTIAL/PLATELET - Abnormal; Notable for the following components:   WBC 11.0 (*)    RBC 3.63 (*)    Hemoglobin 10.9 (*)    HCT 35.1 (*)    Neutro Abs 8.5 (*)    Abs Immature Granulocytes 0.09 (*)    All other components within normal limits  URINALYSIS, ROUTINE W REFLEX MICROSCOPIC - Abnormal; Notable for the following components:   APPearance CLOUDY (*)    Hgb urine dipstick MODERATE (*)    Protein, ur 100 (*)    Leukocytes,Ua LARGE (*)    All other components within normal limits  URINALYSIS, MICROSCOPIC (REFLEX) - Abnormal; Notable for the following components:   Bacteria, UA MANY (*)    All other components within normal limits  CULTURE, BLOOD (ROUTINE X 2)  CULTURE, BLOOD (ROUTINE X 2)  SARS CORONAVIRUS 2  (HOSPITAL ORDER, [place] [facility])  URINE CULTURE  BRAIN NATRIURETIC PEPTIDE  TROPONIN I  LACTIC ACID, PLASMA    EKG None  Radiology Dg Chest Portable 1 View  Result Date: 06/12/2018 CLINICAL DATA:  Two episodes of loss of consciousness. EXAM: PORTABLE CHEST 1 VIEW COMPARISON:  06/07/2018 FINDINGS: The right IJ power port is stable. The heart is enlarged but unchanged. The mediastinal and hilar contours are prominent but unchanged. The lungs demonstrate slight improved aeration with resolving edema and atelectasis. No obvious pleural effusions. The bony thorax is intact. IMPRESSION: Slight improved lung aeration with resolving edema and atelectasis. No definite pleural effusions. Electronically Signed   By: Marijo Sanes M.D.   On: 06/12/2018 13:06    Procedures Procedures (including critical care time)  Medications Ordered in ED Medications  levofloxacin (LEVAQUIN) IVPB 500 mg (500 mg Intravenous New Bag/Given 06/12/18 1345)  sodium chloride 0.9 % bolus 2,000 mL (2,000 mLs Intravenous New Bag/Given 06/12/18 1346)     Initial Impression / Assessment and Plan / ED Course  I have reviewed the triage vital signs and the nursing notes.  Pertinent labs & imaging results that were available during my care of the patient were reviewed by me and considered in my medical decision making (see chart for details).    Patient with urosepsis and hypovolemia.  She will be admitted to medicine    CRITICAL CARE Performed by: Milton Ferguson Total critical care time: 45 minutes Critical care time was exclusive of separately billable procedures and treating other patients. Critical care was necessary to treat or prevent imminent or life-threatening deterioration. Critical care was time spent personally by me on the following activities: development of treatment plan with patient and/or surrogate as well as nursing, discussions with consultants, evaluation of patient's response to  treatment, examination of patient, obtaining history from patient or surrogate, ordering and performing treatments and interventions, ordering and review of laboratory studies, ordering and review of radiographic studies, pulse oximetry and re-evaluation of patient's condition.  Final Clinical Impressions(s) / ED Diagnoses   Final diagnoses:  None    ED Discharge Orders    None       Milton Ferguson, MD 06/12/18 1443

## 2018-06-12 NOTE — ED Notes (Signed)
Not able to obtain a urine sample. Urine is too thick to go through the in and out catheter.

## 2018-06-12 NOTE — ED Notes (Signed)
Having difficulty obtaining labs.  Will hang antibiotic.

## 2018-06-13 ENCOUNTER — Inpatient Hospital Stay (HOSPITAL_COMMUNITY): Payer: Medicare Other

## 2018-06-13 LAB — CBC
HCT: 32.1 % — ABNORMAL LOW (ref 36.0–46.0)
Hemoglobin: 9.9 g/dL — ABNORMAL LOW (ref 12.0–15.0)
MCH: 30.1 pg (ref 26.0–34.0)
MCHC: 30.8 g/dL (ref 30.0–36.0)
MCV: 97.6 fL (ref 80.0–100.0)
Platelets: 291 10*3/uL (ref 150–400)
RBC: 3.29 MIL/uL — ABNORMAL LOW (ref 3.87–5.11)
RDW: 14.6 % (ref 11.5–15.5)
WBC: 10.5 10*3/uL (ref 4.0–10.5)
nRBC: 0 % (ref 0.0–0.2)

## 2018-06-13 LAB — BASIC METABOLIC PANEL
Anion gap: 13 (ref 5–15)
BUN: 49 mg/dL — ABNORMAL HIGH (ref 8–23)
CO2: 25 mmol/L (ref 22–32)
Calcium: 9 mg/dL (ref 8.9–10.3)
Chloride: 102 mmol/L (ref 98–111)
Creatinine, Ser: 3.62 mg/dL — ABNORMAL HIGH (ref 0.44–1.00)
GFR calc Af Amer: 14 mL/min — ABNORMAL LOW (ref >60)
GFR calc non Af Amer: 12 mL/min — ABNORMAL LOW (ref >60)
Glucose, Bld: 105 mg/dL — ABNORMAL HIGH (ref 70–99)
Potassium: 4.6 mmol/L (ref 3.5–5.1)
Sodium: 140 mmol/L (ref 135–145)

## 2018-06-13 LAB — GLUCOSE, CAPILLARY
Glucose-Capillary: 107 mg/dL — ABNORMAL HIGH (ref 70–99)
Glucose-Capillary: 109 mg/dL — ABNORMAL HIGH (ref 70–99)
Glucose-Capillary: 109 mg/dL — ABNORMAL HIGH (ref 70–99)
Glucose-Capillary: 117 mg/dL — ABNORMAL HIGH (ref 70–99)
Glucose-Capillary: 93 mg/dL (ref 70–99)
Glucose-Capillary: 96 mg/dL (ref 70–99)

## 2018-06-13 LAB — LACTIC ACID, PLASMA: Lactic Acid, Venous: 1.3 mmol/L (ref 0.5–1.9)

## 2018-06-13 LAB — MAGNESIUM: Magnesium: 1.9 mg/dL (ref 1.7–2.4)

## 2018-06-13 MED ORDER — CHLORHEXIDINE GLUCONATE CLOTH 2 % EX PADS
6.0000 | MEDICATED_PAD | Freq: Every day | CUTANEOUS | Status: DC
  Administered 2018-06-13 – 2018-06-17 (×5): 6 via TOPICAL

## 2018-06-13 MED ORDER — MIDODRINE HCL 5 MG PO TABS
10.0000 mg | ORAL_TABLET | Freq: Three times a day (TID) | ORAL | Status: DC
  Administered 2018-06-13 – 2018-06-17 (×12): 10 mg via ORAL
  Filled 2018-06-13 (×13): qty 2

## 2018-06-13 NOTE — Progress Notes (Signed)
PROGRESS NOTE    NIKIE CID  FFM:[id number] DOB: 05/19/1950 DOA: 06/12/2018 PCP: [facility]   Brief Narrative:  Per HPI: Rachel Morgan is a 68 y.o. female with medical history significant for morbid obesity, MR, hypertension, hypothyroidism, CKD stage III, chronic diastolic heart failure with EF 60%, GERD, nephrolithiasis with prior stent, and anxiety who was brought to the ED with some altered mental status which occurred earlier this morning.  This was accompanied with some epigastric abdominal pain.  Her sister is a primary historian at the bedside and states that she was also noted to have some mild nausea.  She had an episode of emesis in the emergency department.  Of note, patient was recently discharged on 6/1 after treatment for acute on chronic diastolic heart failure.  She was previously noted to have nephrolithiasis and underwent ureteral stent placement as well.  No symptoms of dysuria or hematuria noted.  Patient was admitted with severe sepsis secondary to UTI along with noted AKI.  She was also noted to have some nausea and vomiting.  Assessment & Plan:   Principal Problem:   Sepsis ([place]) Active Problems:   Hypothyroidism   Hypertension   GERD (gastroesophageal reflux disease)   Hyperkalemia   UTI (urinary tract infection)   Acute renal failure superimposed on stage 3 chronic kidney disease (HCC)   Morbid obesity with BMI of 50.0-59.9, adult (HCC)   Severe sepsis secondary to UTI-improving -Blood and urine cultures collected, will follow, but with no growth thus far -Maintain on IV fluid at aggressive rate, gentle in the setting of CHF.  2 L given in ED -Midodrine added on 6/6 to help improve blood pressure readings -Monitor repeat lactic acid levels -IV Levaquin -Avoid blood pressure agents  AKI on CKD stage III secondary to above-worsening -Avoid nephrotoxic agents -Renal ultrasound negative -Admitted midodrine to assist with  blood pressure readings -Repeat renal panel in a.m. and consider nephrology consultation if worsening -Monitor strict I's and O's -Patient is not currently oliguric and there is no acidosis or hyperkalemia noted  Hyperkalemia secondary to AKI -Resolved  Acute encephalopathy-improving -Difficult to assess in the setting of MR, but will monitor with neurochecks in stepdown unit -Secondary to sepsis  Nausea and vomiting-improved -Zofran as needed -Advance to heart healthy diet  Chronic diastolic heart failure -Currently without any acute symptomatology -Monitor daily weights with recent weight on discharge 6/1 117 kg, current weight appears to be the same -Patient is currently up 2 kg in weight and will monitor closely  GERD -PPI  Hypothyroidism -Synthroid  Hypertension -Currently with soft blood pressure readings; avoid antihypertensives  MR -Sister at bedside to assist with history  Morbid obesity -Lifestyle changes    DVT prophylaxis: Heparin Code Status: Full code Family Communication: Sister, [name] at bedside Disposition Plan: Continue IV fluid and use midodrine to assist with blood pressure support.  Continue on Levaquin and monitor urine cultures.  Advance diet today.  Renal ultrasound without any findings of hydronephrosis.   Consultants:   None  Procedures:   None  Antimicrobials:   Levaquin 6/5->   Subjective: Patient seen and evaluated today with no new acute complaints or concerns. No acute concerns or events noted overnight.  She has less nausea and vomiting and sister at bedside claims that mentation has improved.  Blood pressures continue to remain soft with minimal urine output overnight.  Objective: Vitals:   06/13/18 1000 06/13/18 1100 06/13/18 1138 06/13/18 1200  BP: Marland Kitchen)  86/52 (!) 90/47  (!) 93/45  Pulse: 62 60 (!) 56 (!) 59  Resp: 13 16 14 16   Temp:   97.6 F (36.4 C)   TempSrc:   Oral   SpO2: 100% 98% 100% 100%   Weight:      Height:        Intake/Output Summary (Last 24 hours) at 06/13/2018 1402 Last data filed at 06/13/2018 0300 Gross per 24 hour  Intake 1851.17 ml  Output -  Net 1851.17 ml   Filed Weights   06/12/18 1236 06/13/18 0400  Weight: 117 kg 119.2 kg    Examination:  General exam: Appears calm and comfortable, patient has MR, obese Respiratory system: Clear to auscultation. Respiratory effort normal. Cardiovascular system: S1 & S2 heard, RRR. No JVD, murmurs, rubs, gallops or clicks. No pedal edema. Gastrointestinal system: Abdomen is nondistended, soft and nontender. No organomegaly or masses felt. Normal bowel sounds heard. Central nervous system: Alert and awake Extremities: Symmetric 5 x 5 power. Skin: No rashes, lesions or ulcers Psychiatry: Difficult to obtain given patient condition.    Data Reviewed: I have personally reviewed following labs and imaging studies  CBC: Recent Labs  Lab 06/12/18 1334 06/13/18 0437  WBC 11.0* 10.5  NEUTROABS 8.5*  --   HGB 10.9* 9.9*  HCT 35.1* 32.1*  MCV 96.7 97.6  PLT 352 462   Basic Metabolic Panel: Recent Labs  Lab 06/07/18 0612 06/08/18 0550 06/12/18 1334 06/13/18 0437  NA 138 137 139 140  K 4.1 4.3 5.5* 4.6  CL 93* 91* 100 102  CO2 30 32 22 25  GLUCOSE 123* 126* 182* 105*  BUN 39* 41* 48* 49*  CREATININE 2.34* 2.62* 3.18* 3.62*  CALCIUM 9.0 9.2 9.1 9.0  MG  --   --   --  1.9   GFR: Estimated Creatinine Clearance: 17.8 mL/min (A) (by C-G formula based on SCr of 3.62 mg/dL (H)). Liver Function Tests: Recent Labs  Lab 06/12/18 1334  AST 27  ALT 22  ALKPHOS 80  BILITOT 0.6  PROT 7.0  ALBUMIN 3.3*   No results for input(s): LIPASE, AMYLASE in the last 168 hours. No results for input(s): AMMONIA in the last 168 hours. Coagulation Profile: No results for input(s): INR, PROTIME in the last 168 hours. Cardiac Enzymes: Recent Labs  Lab 06/12/18 1334  TROPONINI <0.03   BNP (last 3 results) No results  for input(s): PROBNP in the last 8760 hours. HbA1C: No results for input(s): HGBA1C in the last 72 hours. CBG: Recent Labs  Lab 06/12/18 2007 06/13/18 0006 06/13/18 0358 06/13/18 0753 06/13/18 1135  GLUCAP 144* 107* 93 96 109*   Lipid Profile: No results for input(s): CHOL, HDL, LDLCALC, TRIG, CHOLHDL, LDLDIRECT in the last 72 hours. Thyroid Function Tests: No results for input(s): TSH, T4TOTAL, FREET4, T3FREE, THYROIDAB in the last 72 hours. Anemia Panel: No results for input(s): VITAMINB12, FOLATE, FERRITIN, TIBC, IRON, RETICCTPCT in the last 72 hours. Sepsis Labs: Recent Labs  Lab 06/12/18 1335 06/12/18 1511 06/13/18 0437  LATICACIDVEN 2.1* 1.1 1.3    Recent Results (from the past 240 hour(s))  SARS Coronavirus 2 (CEPHEID- Performed in [facility] hospital lab), Hosp Order     Status: None   Collection Time: 06/12/18 12:51 PM  Result Value Ref Range Status   SARS Coronavirus 2 NEGATIVE NEGATIVE Final    Comment: (NOTE) If result is NEGATIVE SARS-CoV-2 target nucleic acids are NOT DETECTED. The SARS-CoV-2 RNA is generally detectable in upper and lower  respiratory specimens during the acute phase of infection. The lowest  concentration of SARS-CoV-2 viral copies this assay can detect is 250  copies / mL. A negative result does not preclude SARS-CoV-2 infection  and should not be used as the sole basis for treatment or other  patient management decisions.  A negative result may occur with  improper specimen collection / handling, submission of specimen other  than nasopharyngeal swab, presence of viral mutation(s) within the  areas targeted by this assay, and inadequate number of viral copies  (<250 copies / mL). A negative result must be combined with clinical  observations, patient history, and epidemiological information. If result is POSITIVE SARS-CoV-2 target nucleic acids are DETECTED. The SARS-CoV-2 RNA is generally detectable in upper and lower  respiratory  specimens dur ing the acute phase of infection.  Positive  results are indicative of active infection with SARS-CoV-2.  Clinical  correlation with patient history and other diagnostic information is  necessary to determine patient infection status.  Positive results do  not rule out bacterial infection or co-infection with other viruses. If result is PRESUMPTIVE POSTIVE SARS-CoV-2 nucleic acids MAY BE PRESENT.   A presumptive positive result was obtained on the submitted specimen  and confirmed on repeat testing.  While 2019 novel coronavirus  (SARS-CoV-2) nucleic acids may be present in the submitted sample  additional confirmatory testing may be necessary for epidemiological  and / or clinical management purposes  to differentiate between  SARS-CoV-2 and other Sarbecovirus currently known to infect humans.  If clinically indicated additional testing with an alternate test  methodology [id number]) is advised. The SARS-CoV-2 RNA is generally  detectable in upper and lower respiratory sp ecimens during the acute  phase of infection. The expected result is Negative. Fact Sheet for Patients:  StrictlyIdeas.no Fact Sheet for Healthcare Providers: BankingDealers.co.za This test is not yet approved or cleared by the [place] FDA and has been authorized for detection and/or diagnosis of SARS-CoV-2 by FDA under an Emergency Use Authorization (EUA).  This EUA will remain in effect (meaning this test can be used) for the duration of the COVID-19 declaration under Section 564(b)(1) of the Act, 21 U.S.C. section 360bbb-3(b)(1), unless the authorization is terminated or revoked sooner. Performed at [facility], [address], [place], [place] [address]   Blood Culture (routine x 2)     Status: None (Preliminary result)   Collection Time: 06/12/18  1:24 PM  Result Value Ref Range Status   Specimen Description BLOOD LEFT ARM  Final   Special  Requests   Final    BOTTLES DRAWN AEROBIC AND ANAEROBIC Blood Culture adequate volume   Culture   Final    NO GROWTH < 24 HOURS Performed at [facility], [address] [place], [place] [address]    Report Status PENDING  Incomplete  Blood Culture (routine x 2)     Status: None (Preliminary result)   Collection Time: 06/12/18  1:35 PM  Result Value Ref Range Status   Specimen Description BLOOD LEFT ARM  Final   Special Requests   Final    BOTTLES DRAWN AEROBIC AND ANAEROBIC Blood Culture results may not be optimal due to an inadequate volume of blood received in culture bottles   Culture   Final    NO GROWTH < 24 HOURS Performed at [facility], [address] [place], [place] [address]    Report Status PENDING  Incomplete  MRSA PCR Screening     Status: None  Collection Time: 06/12/18  7:47 PM  Result Value Ref Range Status   MRSA by PCR NEGATIVE NEGATIVE Final    Comment:        The GeneXpert MRSA Assay (FDA approved for NASAL specimens only), is one component of a comprehensive MRSA colonization surveillance program. It is not intended to diagnose MRSA infection nor to guide or monitor treatment for MRSA infections. Performed at [facility], [address] [place], [place] [address]          Radiology Studies: US Renal  Result Date: 06/13/2018 CLINICAL DATA:  Acute renal failure.  UTI. EXAM: RENAL / URINARY TRACT ULTRASOUND COMPLETE COMPARISON:  CT 04/22/2018 FINDINGS: Exam difficult due to altered mental status and by habitus. Right Kidney: Renal measurements: 8.8 x 5.0 x 5.0 cm = volume: 116 mL . Echogenicity within normal limits. No mass or hydronephrosis visualized. Left Kidney: Renal measurements: 8.6 x 4.0 x 4.6 cm = volume: 82 mL. Echogenicity within normal limits. No mass or hydronephrosis visualized. Bladder: Not visualized. IMPRESSION: Kidneys at the lower limits of normal size with left smaller than right. No hydronephrosis or focal mass. Electronically  Signed   By: Marin Olp M.D.   On: 06/13/2018 13:02   Dg Chest Portable 1 View  Addendum Date: 06/12/2018   ADDENDUM REPORT: 06/12/2018 17:24 ADDENDUM: An additional image was added to this study but it does not add any new information to the exam. Electronically Signed   By: Marijo Sanes M.D.   On: 06/12/2018 17:24   Result Date: 06/12/2018 CLINICAL DATA:  Two episodes of loss of consciousness. EXAM: PORTABLE CHEST 1 VIEW COMPARISON:  06/07/2018 FINDINGS: The right IJ power port is stable. The heart is enlarged but unchanged. The mediastinal and hilar contours are prominent but unchanged. The lungs demonstrate slight improved aeration with resolving edema and atelectasis. No obvious pleural effusions. The bony thorax is intact. IMPRESSION: Slight improved lung aeration with resolving edema and atelectasis. No definite pleural effusions. Electronically Signed: By: Marijo Sanes M.D. On: 06/12/2018 13:06        Scheduled Meds: . aspirin EC  81 mg Oral Q breakfast  . Chlorhexidine Gluconate Cloth  6 each Topical Daily  . heparin  5,000 Units Subcutaneous Q8H  . insulin aspart  0-9 Units Subcutaneous Q4H  . levothyroxine  50 mcg Oral QAC breakfast  . mouth rinse  15 mL Mouth Rinse BID  . midodrine  10 mg Oral TID WC  . oxybutynin  10 mg Oral q morning - 10a  . pantoprazole  40 mg Oral Daily   Continuous Infusions: . lactated ringers 125 mL/hr at 06/13/18 0927  . [START ON 06/14/2018] levofloxacin (LEVAQUIN) IV       LOS: 1 day    Time spent: 30 minutes    Thatcher Doberstein Darleen Crocker, DO [facility] Pager [phone]  If 7PM-7AM, please contact night-coverage [email or web address] Password [email or web address] 06/13/2018, 2:02 PM

## 2018-06-13 NOTE — Progress Notes (Addendum)
Less than 34ml urine output during this shift. Notified Dr.Shah. upon cleaning large BM NT and RN noted that pt had cracking and MASD to groin, vagina, and noted that there is an open area on left medial thigh. Bladder scan revealed <63ml urine. Dr. Manuella Ghazi gave order through secure chat to place foley.

## 2018-06-14 LAB — GLUCOSE, CAPILLARY
Glucose-Capillary: 96 mg/dL (ref 70–99)
Glucose-Capillary: 106 mg/dL — ABNORMAL HIGH (ref 70–99)
Glucose-Capillary: 114 mg/dL — ABNORMAL HIGH (ref 70–99)
Glucose-Capillary: 95 mg/dL (ref 70–99)
Glucose-Capillary: 96 mg/dL (ref 70–99)
Glucose-Capillary: 152 mg/dL — ABNORMAL HIGH (ref 70–99)

## 2018-06-14 LAB — SODIUM, URINE, RANDOM: Sodium, Ur: 41 mmol/L

## 2018-06-14 LAB — CBC
HCT: 29.6 % — ABNORMAL LOW (ref 36.0–46.0)
Hemoglobin: 9.1 g/dL — ABNORMAL LOW (ref 12.0–15.0)
MCH: 29.7 pg (ref 26.0–34.0)
MCHC: 30.7 g/dL (ref 30.0–36.0)
MCV: 96.7 fL (ref 80.0–100.0)
Platelets: 268 10*3/uL (ref 150–400)
RBC: 3.06 MIL/uL — ABNORMAL LOW (ref 3.87–5.11)
RDW: 14.6 % (ref 11.5–15.5)
WBC: 7.6 10*3/uL (ref 4.0–10.5)
nRBC: 0 % (ref 0.0–0.2)

## 2018-06-14 LAB — BASIC METABOLIC PANEL
Anion gap: 13 (ref 5–15)
BUN: 46 mg/dL — ABNORMAL HIGH (ref 8–23)
CO2: 24 mmol/L (ref 22–32)
Calcium: 8.5 mg/dL — ABNORMAL LOW (ref 8.9–10.3)
Chloride: 101 mmol/L (ref 98–111)
Creatinine, Ser: 3.83 mg/dL — ABNORMAL HIGH (ref 0.44–1.00)
GFR calc Af Amer: 13 mL/min — ABNORMAL LOW (ref >60)
GFR calc non Af Amer: 12 mL/min — ABNORMAL LOW (ref >60)
Glucose, Bld: 107 mg/dL — ABNORMAL HIGH (ref 70–99)
Potassium: 5 mmol/L (ref 3.5–5.1)
Sodium: 138 mmol/L (ref 135–145)

## 2018-06-14 LAB — CREATININE, URINE, RANDOM: Creatinine, Urine: 71.22 mg/dL

## 2018-06-14 LAB — CHLORIDE, URINE, RANDOM: Chloride Urine: 26 mmol/L

## 2018-06-14 MED ORDER — ZOLPIDEM TARTRATE 5 MG PO TABS
5.0000 mg | ORAL_TABLET | Freq: Once | ORAL | Status: AC
  Administered 2018-06-14: 5 mg via ORAL
  Filled 2018-06-14: qty 1

## 2018-06-14 MED ORDER — SODIUM CHLORIDE 0.9 % IV SOLN
INTRAVENOUS | Status: DC
  Administered 2018-06-14 – 2018-06-16 (×4): via INTRAVENOUS

## 2018-06-14 MED ORDER — INSULIN ASPART 100 UNIT/ML ~~LOC~~ SOLN
0.0000 [IU] | Freq: Three times a day (TID) | SUBCUTANEOUS | Status: DC
  Administered 2018-06-15: 2 [IU] via SUBCUTANEOUS
  Administered 2018-06-15 – 2018-06-16 (×2): 1 [IU] via SUBCUTANEOUS

## 2018-06-14 NOTE — Consult Note (Signed)
Reason for Consult: Acute kidney injury on chronic kidney disease stage III-IV Referring Physician: Heath Lark MD [facility])  HPI:  68 year old Caucasian woman with past medical history significant for cerebral palsy with cognitive delay, hypothyroidism, diastolic heart failure, hypertension, history of right UPJ stone status post double-J stent on 04/22/2018 and what appears to be baseline chronic kidney disease stage III-IV with baseline creatinine possibly around 1.9.  She was recently discharged from the hospital on 06/08/2018 after management of acute exacerbation of diastolic heart failure with a uptrending creatinine at 2.6.  She was brought into the emergency room 2 days ago with hypotension/encephalopathy that appears to be from Klebsiella urinary tract infection with sepsis.  Concern is raised with her rising creatinine that has risen from 3.2 on admission now to 3.8.  She is charted as being oliguric with 325 cc urine output overnight.  Cloudy/opaque urine noted in catheter tubing.  Renal ultrasound from yesterday did not show any obstruction.  Initially with significant hypotension as low as 80/40 which appears to be gradually improving.  Past Medical History:  Diagnosis Date  . Anxiety   . Calculus of gallbladder   . Cerebral palsy ([place])   . Chronic kidney disease   . Diabetes mellitus   . Gall stones   . GERD (gastroesophageal reflux disease)   . High cholesterol   . History of kidney stones   . Hypertension   . Hypothyroidism   . Kidney stones   . MR (mental retardation)   . Pyelonephritis   . Sepsis [facility])     Past Surgical History:  Procedure Laterality Date  . CYSTOSCOPY W/ URETERAL STENT PLACEMENT Bilateral 09/26/2015   Procedure: CYSTOSCOPY WITH Bilateral  RETROGRADE PYELOGRAM/URETERAL STENT PLACEMENT;  Surgeon: [name], MD;  Location: [facility];  Service: Urology;  Laterality: Bilateral;  . CYSTOSCOPY W/ URETERAL STENT PLACEMENT Right 04/22/2018   Procedure: CYSTOSCOPY  WITH RETROGRADE PYELOGRAM/URETERAL STENT PLACEMENT;  Surgeon: Cleon Gustin, MD;  Location: [facility];  Service: Urology;  Laterality: Right;  . CYSTOSCOPY W/ URETERAL STENT REMOVAL Left 11/10/2015   Procedure: CYSTOSCOPY WITH STENT REMOVAL;  Surgeon: Cleon Gustin, MD;  Location: [facility];  Service: Urology;  Laterality: Left;  . CYSTOSCOPY WITH STENT PLACEMENT Right 01/21/2016   Procedure: CYSTOSCOPY WITH STENT PLACEMENT;  Surgeon: Franchot Gallo, MD;  Location: [facility];  Service: Urology;  Laterality: Right;  . CYSTOSCOPY/RETROGRADE/URETEROSCOPY/STONE EXTRACTION WITH BASKET Left 10/31/2015   Procedure: CYSTOSCOPY/ BILATERAL URETEROSCOPY/BILATERAL STONE EXTRACTION/ LEFT STENT PLACEMENT;  Surgeon: Irine Seal, MD;  Location: [facility];  Service: Urology;  Laterality: Left;  . CYSTOSCOPY/URETEROSCOPY/HOLMIUM LASER/STENT PLACEMENT Right 02/27/2016   Procedure: CYSTOSCOPY/RIGHT URETEROSCOPY/HOLMIUM LASER/STENT PLACEMENT/STENT REMOVAL;  Surgeon: Irine Seal, MD;  Location: [facility];  Service: Urology;  Laterality: Right;  . HOLMIUM LASER APPLICATION N/A 50/38/8828   Procedure: HOLMIUM LASER APPLICATION;  Surgeon: Irine Seal, MD;  Location: [facility];  Service: Urology;  Laterality: N/A;  . IR CV LINE INJECTION  05/06/2018  . IR FLUORO GUIDE PORT INSERTION RIGHT  04/10/2016  . IR US GUIDE VASC ACCESS RIGHT  04/10/2016    Family History  Family history unknown: Yes    Social History:  reports that she has never smoked. She has never used smokeless tobacco. She reports that she does not drink alcohol or use drugs.  Allergies:  Allergies  Allergen Reactions  . Macrobid [Nitrofurantoin Monohyd Macro] Hives and Swelling  . Penicillins Swelling and Rash    Has patient had a PCN reaction causing immediate  rash, facial/tongue/throat swelling, SOB or lightheadedness with hypotension: Yes Has patient had a PCN reaction causing severe rash involving mucus membranes or skin necrosis: Yes Has patient had a PCN  reaction that required hospitalization: no Has patient had a PCN reaction occurring within the last 10 years: no If all of the above answers are "NO", then may proceed with Cephalosporin use. Patient tolerated rocephin and ertapenem in the past    Medications:  Scheduled: . aspirin EC  81 mg Oral Q breakfast  . Chlorhexidine Gluconate Cloth  6 each Topical Daily  . heparin  5,000 Units Subcutaneous Q8H  . insulin aspart  0-9 Units Subcutaneous Q4H  . levothyroxine  50 mcg Oral QAC breakfast  . mouth rinse  15 mL Mouth Rinse BID  . midodrine  10 mg Oral TID WC  . oxybutynin  10 mg Oral q morning - 10a  . pantoprazole  40 mg Oral Daily    BMP Latest Ref Rng & Units 06/14/2018 06/13/2018 06/12/2018  Glucose 70 - 99 mg/dL 107(H) 105(H) 182(H)  BUN 8 - 23 mg/dL 46(H) 49(H) 48(H)  Creatinine 0.44 - 1.00 mg/dL 3.83(H) 3.62(H) 3.18(H)  Sodium 135 - 145 mmol/L 138 140 139  Potassium 3.5 - 5.1 mmol/L 5.0 4.6 5.5(H)  Chloride 98 - 111 mmol/L 101 102 100  CO2 22 - 32 mmol/L 24 25 22   Calcium 8.9 - 10.3 mg/dL 8.5(L) 9.0 9.1   CBC Latest Ref Rng & Units 06/14/2018 06/13/2018 06/12/2018  WBC 4.0 - 10.5 K/uL 7.6 10.5 11.0(H)  Hemoglobin 12.0 - 15.0 g/dL 9.1(L) 9.9(L) 10.9(L)  Hematocrit 36.0 - 46.0 % 29.6(L) 32.1(L) 35.1(L)  Platelets 150 - 400 K/uL 268 291 352    US Renal  Result Date: 06/13/2018 CLINICAL DATA:  Acute renal failure.  UTI. EXAM: RENAL / URINARY TRACT ULTRASOUND COMPLETE COMPARISON:  CT 04/22/2018 FINDINGS: Exam difficult due to altered mental status and by habitus. Right Kidney: Renal measurements: 8.8 x 5.0 x 5.0 cm = volume: 116 mL . Echogenicity within normal limits. No mass or hydronephrosis visualized. Left Kidney: Renal measurements: 8.6 x 4.0 x 4.6 cm = volume: 82 mL. Echogenicity within normal limits. No mass or hydronephrosis visualized. Bladder: Not visualized. IMPRESSION: Kidneys at the lower limits of normal size with left smaller than right. No hydronephrosis or focal mass.  Electronically Signed   By: Marin Olp M.D.   On: 06/13/2018 13:02    Review of Systems  Unable to perform ROS: Mental acuity   Blood pressure 99/65, pulse 67, temperature 98 F (36.7 C), temperature source Oral, resp. rate 19, height 4\' 11"  (1.499 m), weight 121.8 kg, SpO2 96 %. Physical Exam  Nursing note and vitals reviewed. Constitutional: She appears well-developed. No distress.  Obese woman  HENT:  Head: Normocephalic and atraumatic.  Mouth/Throat: Oropharynx is clear and moist.  Eyes: Pupils are equal, round, and reactive to light. Conjunctivae are normal. No scleral icterus.  Neck: Normal range of motion. Neck supple. No JVD present.  Cardiovascular: Normal rate, regular rhythm and normal heart sounds.  No murmur heard. Respiratory: Effort normal and breath sounds normal. She has no wheezes. She has no rales.  GI: Soft. Bowel sounds are normal. There is no abdominal tenderness. There is no rebound.  Musculoskeletal:        General: Edema present.     Comments: Trace-1+ dependent edema.  Difficult to verify lower extremity edema.  Skin: Skin is warm and dry. No rash noted. She is not diaphoretic.  Bruising over lower extremities noted    Assessment/Plan: 1.  Acute kidney injury on chronic kidney disease stage 3-4: It is difficult to verify this patient's baseline renal function more recently following admission for CHF exacerbation that may have skewed initial labs from dilution.  When seen during her previous hospitalization 2 months ago, it was presumed that her baseline creatinine was 1.4-1.8.  The current sequence of events and available database points to ATN likely associated with sepsis/hypotension.  She does not have any florid volume overload and I agree with gentle maintenance IV fluids.  Initial urinalysis suggestive of UTI and urine electrolytes from today corroborate ATN/intrarenal process.  No prior nephrotoxin exposure noted, continue supportive management of  hypertension/infection/sepsis.  Avoid iodinated intravenous contrast/NSAIDs.  Hold diuretics at this time.  Will discontinue lactated Ringers given rising potassium level. 2.  Klebsiella urinary tract infection with sepsis/encephalopathy: On levofloxacin adjusted for renal function. 3.  Hypotension: Secondary to sepsis/SIRS, ongoing midodrine and will adjust fluids. 4.  Hyperkalemia: Mild, secondary to AKI and ongoing therapy-discontinue lactated Ringer's. 5.  Chronic diastolic heart failure: Appears to be compensated at this point  Teddy Pena K. 06/14/2018, 2:12 PM

## 2018-06-14 NOTE — Progress Notes (Signed)
PROGRESS NOTE    Rachel Morgan  TMH:[id number] DOB: [birth year] DOA: 06/12/2018 PCP: [facility]   Brief Narrative:  Per HPI: Rachel Morgan a 68 y.o.femalewith medical history significant formorbid obesity, MR, hypertension, hypothyroidism, CKD stage III, chronic diastolic heart failure with EF 60%, GERD, nephrolithiasis with prior stent, and anxiety who was brought to the ED with some altered mental status which occurred earlier this morning. This was accompanied with some epigastric abdominal pain. Her sister is a primary historian at the bedside and states that she was also noted to have some mild nausea. She had an episode of emesis in the emergency department. Of note, patient was recently discharged on 6/1 after treatment for acute on chronic diastolic heart failure. She was previously noted to have nephrolithiasis and underwent ureteral stent placement as well. No symptoms of dysuria or hematuria noted.  Patient was admitted with severe sepsis secondary to UTI along with noted AKI.  She was also noted to have some nausea and vomiting.  Her symptoms are beginning to resolve and she has no further encephalopathy or nausea and vomiting and is tolerating diet.  Unfortunately, she continues to have upward trend in her creatinine levels with some oliguria noted yesterday.  Her blood pressures remain soft, but have responded to use of midodrine on 6/6.   Assessment & Plan:   Principal Problem:   Sepsis ([place]) Active Problems:   Hypothyroidism   Hypertension   GERD (gastroesophageal reflux disease)   Hyperkalemia   UTI (urinary tract infection)   Acute renal failure superimposed on stage 3 chronic kidney disease (HCC)   Morbid obesity with BMI of 50.0-59.9, adult (HCC)  Severe sepsis secondary to UTI-improving -Blood and urine cultures collected, will follow, but with no growth thus far -Maintain on IV fluid at aggressive rate until urine output  picks up further2 L given in ED -Midodrine added on 6/6 to help improve blood pressure readings -Continue IV Levaquin -Avoid blood pressure agents  AKI on CKD stage III with oliguria secondary to above -Avoid nephrotoxic agents -Renal ultrasound negative -Urine electrolytes ordered this a.m. -Midodrine to assist with blood pressure readings -Repeat renal panel in a.m. -Renal consultation appreciated -Monitor strict I's and O's now with Foley catheter placed 6/6 -No significant hyperkalemia or acidosis currently noted  Hyperkalemia secondary to AKI -Resolved -Continue monitoring and repeat BMP  Acute encephalopathy- resolved and at baseline and patient with MR -Secondary to sepsis  Nausea and vomiting- resolved -Zofran as needed -Maintain heart healthy diet  Chronic diastolic heart failure -Currently without any acute symptomatology -Monitor daily weights with recent weight on discharge 6/1 117 kg, current weight appears to be the same -Patient is currently up 4 kg in weight and will monitor closely -No significant dyspnea or increased oxygenation requirements noted -Mild edema peripherally  GERD -PPI  Hypothyroidism -Synthroid  Hypertension -Currently with soft blood pressure readings; avoid antihypertensives -Started on midodrine 6/6  MR -Sister at bedside to assist with history  Morbid obesity -Lifestyle changes    DVT prophylaxis: Heparin Code Status: Full code Family Communication: Sister, [name] at bedside Disposition Plan: Continue IV fluid and use midodrine to assist with blood pressure support.  Continue on Levaquin and monitor urine cultures.  Continue monitoring strict I's and O's and maintain on IV fluid.  Renal consultation appreciated.   Consultants:   Nephrology  Procedures:   None  Antimicrobials:   Levaquin 6/5->  Subjective: Patient seen and evaluated today with no  new acute complaints or concerns. No acute  concerns or events noted overnight.  She appears to be tolerating diet and just this morning appears to be picking up her urine output a little bit.  Sister at bedside states that mentation is improving.  Objective: Vitals:   06/14/18 0300 06/14/18 0400 06/14/18 0500 06/14/18 0722  BP: (!) 98/41 98/63 (!) 102/53   Pulse: 65 69 73 74  Resp: 18 (!) 21 (!) 22 19  Temp:  (!) 97 F (36.1 C)  98.4 F (36.9 C)  TempSrc:  Oral  Oral  SpO2: 96% 100% 97% 100%  Weight:   121.8 kg   Height:        Intake/Output Summary (Last 24 hours) at 06/14/2018 0749 Last data filed at 06/14/2018 0600 Gross per 24 hour  Intake -  Output 325 ml  Net -325 ml   Filed Weights   06/12/18 1236 06/13/18 0400 06/14/18 0500  Weight: 117 kg 119.2 kg 121.8 kg    Examination:  General exam: Appears calm and comfortable, with MR, morbidly obese Respiratory system: Clear to auscultation. Respiratory effort normal.  Currently on 2 L nasal cannula Cardiovascular system: S1 & S2 heard, RRR. No JVD, murmurs, rubs, gallops or clicks.  Scant pedal edema noted. Gastrointestinal system: Abdomen is nondistended, soft and nontender. No organomegaly or masses felt. Normal bowel sounds heard. Central nervous system: Alert and oriented. No focal neurological deficits. Extremities: Symmetric 5 x 5 power. Skin: No rashes, lesions or ulcers Psychiatry: Difficult to assess.    Data Reviewed: I have personally reviewed following labs and imaging studies  CBC: Recent Labs  Lab 06/12/18 1334 06/13/18 0437 06/14/18 0443  WBC 11.0* 10.5 7.6  NEUTROABS 8.5*  --   --   HGB 10.9* 9.9* 9.1*  HCT 35.1* 32.1* 29.6*  MCV 96.7 97.6 96.7  PLT 352 291 564   Basic Metabolic Panel: Recent Labs  Lab 06/08/18 0550 06/12/18 1334 06/13/18 0437 06/14/18 0443  NA 137 139 140 138  K 4.3 5.5* 4.6 5.0  CL 91* 100 102 101  CO2 32 22 25 24   GLUCOSE 126* 182* 105* 107*  BUN 41* 48* 49* 46*  CREATININE 2.62* 3.18* 3.62* 3.83*  CALCIUM  9.2 9.1 9.0 8.5*  MG  --   --  1.9  --    GFR: Estimated Creatinine Clearance: 17 mL/min (A) (by C-G formula based on SCr of 3.83 mg/dL (H)). Liver Function Tests: Recent Labs  Lab 06/12/18 1334  AST 27  ALT 22  ALKPHOS 80  BILITOT 0.6  PROT 7.0  ALBUMIN 3.3*   No results for input(s): LIPASE, AMYLASE in the last 168 hours. No results for input(s): AMMONIA in the last 168 hours. Coagulation Profile: No results for input(s): INR, PROTIME in the last 168 hours. Cardiac Enzymes: Recent Labs  Lab 06/12/18 1334  TROPONINI <0.03   BNP (last 3 results) No results for input(s): PROBNP in the last 8760 hours. HbA1C: No results for input(s): HGBA1C in the last 72 hours. CBG: Recent Labs  Lab 06/13/18 1641 06/13/18 2014 06/14/18 0114 06/14/18 0509 06/14/18 0721  GLUCAP 117* 109* 96 106* 114*   Lipid Profile: No results for input(s): CHOL, HDL, LDLCALC, TRIG, CHOLHDL, LDLDIRECT in the last 72 hours. Thyroid Function Tests: No results for input(s): TSH, T4TOTAL, FREET4, T3FREE, THYROIDAB in the last 72 hours. Anemia Panel: No results for input(s): VITAMINB12, FOLATE, FERRITIN, TIBC, IRON, RETICCTPCT in the last 72 hours. Sepsis Labs: Recent Labs  Lab  06/12/18 1335 06/12/18 1511 06/13/18 0437  LATICACIDVEN 2.1* 1.1 1.3    Recent Results (from the past 240 hour(s))  SARS Coronavirus 2 (CEPHEID- Performed in [place] hospital lab), Hosp Order     Status: None   Collection Time: 06/12/18 12:51 PM  Result Value Ref Range Status   SARS Coronavirus 2 NEGATIVE NEGATIVE Final    Comment: (NOTE) If result is NEGATIVE SARS-CoV-2 target nucleic acids are NOT DETECTED. The SARS-CoV-2 RNA is generally detectable in upper and lower  respiratory specimens during the acute phase of infection. The lowest  concentration of SARS-CoV-2 viral copies this assay can detect is 250  copies / mL. A negative result does not preclude SARS-CoV-2 infection  and should not be used as the  sole basis for treatment or other  patient management decisions.  A negative result may occur with  improper specimen collection / handling, submission of specimen other  than nasopharyngeal swab, presence of viral mutation(s) within the  areas targeted by this assay, and inadequate number of viral copies  (<250 copies / mL). A negative result must be combined with clinical  observations, patient history, and epidemiological information. If result is POSITIVE SARS-CoV-2 target nucleic acids are DETECTED. The SARS-CoV-2 RNA is generally detectable in upper and lower  respiratory specimens dur ing the acute phase of infection.  Positive  results are indicative of active infection with SARS-CoV-2.  Clinical  correlation with patient history and other diagnostic information is  necessary to determine patient infection status.  Positive results do  not rule out bacterial infection or co-infection with other viruses. If result is PRESUMPTIVE POSTIVE SARS-CoV-2 nucleic acids MAY BE PRESENT.   A presumptive positive result was obtained on the submitted specimen  and confirmed on repeat testing.  While 2019 novel coronavirus  (SARS-CoV-2) nucleic acids may be present in the submitted sample  additional confirmatory testing may be necessary for epidemiological  and / or clinical management purposes  to differentiate between  SARS-CoV-2 and other Sarbecovirus currently known to infect humans.  If clinically indicated additional testing with an alternate test  methodology [phone]) is advised. The SARS-CoV-2 RNA is generally  detectable in upper and lower respiratory sp ecimens during the acute  phase of infection. The expected result is Negative. Fact Sheet for Patients:  StrictlyIdeas.no Fact Sheet for Healthcare Providers: BankingDealers.co.za This test is not yet approved or cleared by the [place] FDA and has been authorized for detection  and/or diagnosis of SARS-CoV-2 by FDA under an Emergency Use Authorization (EUA).  This EUA will remain in effect (meaning this test can be used) for the duration of the COVID-19 declaration under Section 564(b)(1) of the Act, 21 U.S.C. section 360bbb-3(b)(1), unless the authorization is terminated or revoked sooner. Performed at [facility], [address], [place], [place] [address]   Blood Culture (routine x 2)     Status: None (Preliminary result)   Collection Time: 06/12/18  1:24 PM  Result Value Ref Range Status   Specimen Description BLOOD LEFT ARM  Final   Special Requests   Final    BOTTLES DRAWN AEROBIC AND ANAEROBIC Blood Culture adequate volume   Culture   Final    NO GROWTH 2 DAYS Performed at [facility], [address], [place], [place] [address]    Report Status PENDING  Incomplete  Blood Culture (routine x 2)     Status: None (Preliminary result)   Collection Time: 06/12/18  1:35 PM  Result Value Ref Range  Status   Specimen Description BLOOD LEFT ARM  Final   Special Requests   Final    BOTTLES DRAWN AEROBIC AND ANAEROBIC Blood Culture results may not be optimal due to an inadequate volume of blood received in culture bottles   Culture   Final    NO GROWTH 2 DAYS Performed at [facility], [address], [place], [place] [address]    Report Status PENDING  Incomplete  MRSA PCR Screening     Status: None   Collection Time: 06/12/18  7:47 PM  Result Value Ref Range Status   MRSA by PCR NEGATIVE NEGATIVE Final    Comment:        The GeneXpert MRSA Assay (FDA approved for NASAL specimens only), is one component of a comprehensive MRSA colonization surveillance program. It is not intended to diagnose MRSA infection nor to guide or monitor treatment for MRSA infections. Performed at [facility], [address] [place], [place] [address]          Radiology Studies: US Renal  Result Date: 06/13/2018 CLINICAL DATA:  Acute renal failure.  UTI. EXAM: RENAL  / URINARY TRACT ULTRASOUND COMPLETE COMPARISON:  CT 04/22/2018 FINDINGS: Exam difficult due to altered mental status and by habitus. Right Kidney: Renal measurements: 8.8 x 5.0 x 5.0 cm = volume: 116 mL . Echogenicity within normal limits. No mass or hydronephrosis visualized. Left Kidney: Renal measurements: 8.6 x 4.0 x 4.6 cm = volume: 82 mL. Echogenicity within normal limits. No mass or hydronephrosis visualized. Bladder: Not visualized. IMPRESSION: Kidneys at the lower limits of normal size with left smaller than right. No hydronephrosis or focal mass. Electronically Signed   By: Marin Olp M.D.   On: 06/13/2018 13:02   Dg Chest Portable 1 View  Addendum Date: 06/12/2018   ADDENDUM REPORT: 06/12/2018 17:24 ADDENDUM: An additional image was added to this study but it does not add any new information to the exam. Electronically Signed   By: Marijo Sanes M.D.   On: 06/12/2018 17:24   Result Date: 06/12/2018 CLINICAL DATA:  Two episodes of loss of consciousness. EXAM: PORTABLE CHEST 1 VIEW COMPARISON:  06/07/2018 FINDINGS: The right IJ power port is stable. The heart is enlarged but unchanged. The mediastinal and hilar contours are prominent but unchanged. The lungs demonstrate slight improved aeration with resolving edema and atelectasis. No obvious pleural effusions. The bony thorax is intact. IMPRESSION: Slight improved lung aeration with resolving edema and atelectasis. No definite pleural effusions. Electronically Signed: By: Marijo Sanes M.D. On: 06/12/2018 13:06        Scheduled Meds: . aspirin EC  81 mg Oral Q breakfast  . Chlorhexidine Gluconate Cloth  6 each Topical Daily  . heparin  5,000 Units Subcutaneous Q8H  . insulin aspart  0-9 Units Subcutaneous Q4H  . levothyroxine  50 mcg Oral QAC breakfast  . mouth rinse  15 mL Mouth Rinse BID  . midodrine  10 mg Oral TID WC  . oxybutynin  10 mg Oral q morning - 10a  . pantoprazole  40 mg Oral Daily   Continuous Infusions: . lactated  ringers 125 mL/hr at 06/14/18 0153  . levofloxacin (LEVAQUIN) IV       LOS: 2 days    Time spent: 30 minutes    Zoriah Pulice Darleen Crocker, DO [facility] Pager [phone]  If 7PM-7AM, please contact night-coverage [email or web address] Password [facility] 06/14/2018, 7:49 AM

## 2018-06-14 NOTE — Progress Notes (Signed)
Dr. Kennon Holter paged to see if pts Ambien could be ordered d/t pt not sleeping during the night per caregiver. Waiting for orders/call back. Will continue to monitor pt

## 2018-06-14 NOTE — Progress Notes (Signed)
Patient's urinary output has increased. Leaking present around urinary catheter onto bed. Catheter advanced further.

## 2018-06-15 LAB — CBC
HCT: 29.1 % — ABNORMAL LOW (ref 36.0–46.0)
Hemoglobin: 8.7 g/dL — ABNORMAL LOW (ref 12.0–15.0)
MCH: 29.5 pg (ref 26.0–34.0)
MCHC: 29.9 g/dL — ABNORMAL LOW (ref 30.0–36.0)
MCV: 98.6 fL (ref 80.0–100.0)
Platelets: 268 10*3/uL (ref 150–400)
RBC: 2.95 MIL/uL — ABNORMAL LOW (ref 3.87–5.11)
RDW: 14.6 % (ref 11.5–15.5)
WBC: 5.1 10*3/uL (ref 4.0–10.5)
nRBC: 0 % (ref 0.0–0.2)

## 2018-06-15 LAB — RENAL FUNCTION PANEL
Albumin: 2.5 g/dL — ABNORMAL LOW (ref 3.5–5.0)
Anion gap: 7 (ref 5–15)
BUN: 41 mg/dL — ABNORMAL HIGH (ref 8–23)
CO2: 26 mmol/L (ref 22–32)
Calcium: 8.6 mg/dL — ABNORMAL LOW (ref 8.9–10.3)
Chloride: 107 mmol/L (ref 98–111)
Creatinine, Ser: 3.06 mg/dL — ABNORMAL HIGH (ref 0.44–1.00)
GFR calc Af Amer: 18 mL/min — ABNORMAL LOW (ref >60)
GFR calc non Af Amer: 15 mL/min — ABNORMAL LOW (ref >60)
Glucose, Bld: 101 mg/dL — ABNORMAL HIGH (ref 70–99)
Phosphorus: 4.6 mg/dL (ref 2.5–4.6)
Potassium: 4.6 mmol/L (ref 3.5–5.1)
Sodium: 140 mmol/L (ref 135–145)

## 2018-06-15 LAB — GLUCOSE, CAPILLARY
Glucose-Capillary: 174 mg/dL — ABNORMAL HIGH (ref 70–99)
Glucose-Capillary: 87 mg/dL (ref 70–99)
Glucose-Capillary: 124 mg/dL — ABNORMAL HIGH (ref 70–99)
Glucose-Capillary: 136 mg/dL — ABNORMAL HIGH (ref 70–99)

## 2018-06-15 LAB — URINE CULTURE: Culture: >=100000 — AB

## 2018-06-15 MED ORDER — ZOLPIDEM TARTRATE 5 MG PO TABS
5.0000 mg | ORAL_TABLET | Freq: Every evening | ORAL | Status: DC | PRN
  Administered 2018-06-15 – 2018-06-16 (×2): 5 mg via ORAL
  Filled 2018-06-15 (×2): qty 1

## 2018-06-15 NOTE — TOC Initial Note (Signed)
Transition of Care [facility]) - Initial/Assessment Note    Patient Details  Name: Rachel Morgan MRN: [id number] Date of Birth: 1950/07/16  Transition of Care [facility]) CM/SW Contact:    Shade Flood, LCSW Phone Number: 06/15/2018, 1:45 PM  Clinical Narrative:                  Reviewed pt's record and discussed pt in Progression. Pt's sister is her legal guardian. She is not present at time of attempted LCSW visit today and cannot reach her by phone. From the record, pt lives with sister who is her caregiver. Pt has a walker, commode, and lift chair for DME. It appears pt was dc'd home on O2 after her last admission about a week ago. Pt has [place] for RN, PT, and an aide as well.   Anticipating pt will return home with continued HH at dc. MD indicated pt will likely remain hospitalized for another 1-2 days.   TOC will follow and assist as needed.  Expected Discharge Plan: [place] Barriers to Discharge: Continued Medical Work up   Patient Goals and CMS Choice        Expected Discharge Plan and Services Expected Discharge Plan: [place]       Living arrangements for the past 2 months: Single Family Home                                      Prior Living Arrangements/Services Living arrangements for the past 2 months: Single Family Home   Patient language and need for interpreter reviewed:: Yes Do you feel safe going back to the place where you live?: Yes      Need for Family Participation in Patient Care: Yes (Comment) Care giver support system in place?: Yes (comment) Current home services: DME, Home RN, Homehealth aide, Home PT Criminal Activity/Legal Involvement Pertinent to Current Situation/Hospitalization: No - Comment as needed  Activities of Daily Living Home Assistive Devices/Equipment: Bedside commode/3-in-1, Walker (specify type) ADL Screening (condition at time of admission) Patient's cognitive ability  adequate to safely complete daily activities?: No Is the patient deaf or have difficulty hearing?: No Does the patient have difficulty seeing, even when wearing glasses/contacts?: No Does the patient have difficulty concentrating, remembering, or making decisions?: Yes Patient able to express need for assistance with ADLs?: No Does the patient have difficulty dressing or bathing?: Yes Independently performs ADLs?: No Communication: Independent Dressing (OT): Needs assistance Is this a change from baseline?: Pre-admission baseline Grooming: Needs assistance Is this a change from baseline?: Pre-admission baseline Feeding: Needs assistance Is this a change from baseline?: Pre-admission baseline Bathing: Needs assistance Is this a change from baseline?: Pre-admission baseline Toileting: Needs assistance Is this a change from baseline?: Pre-admission baseline In/Out Bed: Needs assistance Is this a change from baseline?: Pre-admission baseline Walks in Home: Needs assistance Is this a change from baseline?: Pre-admission baseline Does the patient have difficulty walking or climbing stairs?: Yes Weakness of Legs: Both Weakness of Arms/Hands: None  Permission Sought/Granted                  Emotional Assessment Appearance:: Appears stated age     Orientation: : Oriented to Self Alcohol / Substance Use: Not Applicable Psych Involvement: No (comment)  Admission diagnosis:  Confusion [R41.0] Acute cystitis with hematuria [N30.01] Patient Active Problem List   Diagnosis Date  Noted  . Acute renal failure superimposed on stage 3 chronic kidney disease ([place]) 06/03/2018  . Acute on chronic diastolic CHF (congestive heart failure) ([place]) 06/03/2018  . Acute respiratory failure with hypoxia ([place]) 06/03/2018  . Hypokalemia 06/03/2018  . Morbid obesity with BMI of 50.0-59.9, adult ([place]) 06/03/2018  . Acute exacerbation of CHF (congestive heart failure) ([place]) 06/01/2018  . Hypoxia  06/01/2018  . Pneumonia 05/09/2018  . Acute respiratory failure ([place]) 05/07/2018  . Acute metabolic encephalopathy 30/13/1438  . Hypomagnesemia 04/21/2018  . UTI (urinary tract infection) 04/20/2018  . PICC (peripherally inserted central catheter) in place   . Urinary tract infection without hematuria   . Hydronephrosis with renal and ureteral calculus obstruction 01/21/2016  . Hyperkalemia 01/21/2016  . Palliative care encounter   . DNR (do not resuscitate) discussion   . Encephalopathy 10/17/2015  . Goals of care, counseling/discussion 10/17/2015  . Nephrolithiasis 09/27/2015  . Cerebral palsy ([place]) 09/27/2015  . Sepsis ([place]) 09/26/2015  . AKI (acute kidney injury) ([place]) 09/26/2015  . Fever   . Urinary tract infectious disease   . Dilated cbd, acquired 09/14/2015  . Hypothyroidism 09/14/2015  . Hypertension 09/14/2015  . Depression with anxiety 09/14/2015  . UPJ obstruction, acquired 09/14/2015  . CKD (chronic kidney disease), stage III ([place]) 09/14/2015  . GERD (gastroesophageal reflux disease) 09/14/2015  . Calculus of gallbladder without cholecystitis without obstruction   . Abdominal pain, right upper quadrant 02/11/2011  . Acute pyelonephritis 02/11/2011  . Agoraphobia 02/11/2011  . Acute renal failure ([place]) 02/11/2011  . Hyponatremia 02/11/2011  . Dehydration 02/11/2011   PCP:  [facility] [place]:   [place], [place] - [address] [place] [address] Phone: [phone] Fax: [phone]     Social Determinants of Health (SDOH) Interventions    Readmission Risk Interventions Readmission Risk Prevention Plan 06/03/2018 05/11/2018 05/11/2018  Transportation Screening Complete - -  PCP or Specialist Appt within 3-5 Days - - -  HRI or Home Care Consult Complete - -  Social Work Consult for [place] Planning/Counseling Complete - -  Palliative Care Screening Not Applicable - -   Medication Review Press photographer) Complete - -  PCP or Specialist appointment within 3-5 days of discharge - Complete Complete  HRI or [place] - Complete Complete  SW Recovery Care/Counseling Consult - Complete Complete  Palliative Care Screening - Not Applicable Not Applicable  Skilled Nursing Facility - Not Applicable Not Applicable  Some recent data might be hidden

## 2018-06-15 NOTE — Progress Notes (Signed)
Patient ID: Rachel Morgan, female   DOB: Feb 11, 1950, 68 y.o.   MRN: [id number] S: Pt with low BP overnight and given midodrine.  One of her sisters was in the room with her and no issues this morning. O:BP (!) 85/43 (BP Location: Left Wrist)   Pulse (!) 54   Temp 98.2 F (36.8 C) (Oral)   Resp 16   Ht 4\' 11"  (1.499 m)   Wt 123.7 kg   SpO2 96%   BMI 55.08 kg/m   Intake/Output Summary (Last 24 hours) at 06/15/2018 0848 Last data filed at 06/15/2018 0500 Gross per 24 hour  Intake 6378.75 ml  Output 1950 ml  Net 4428.75 ml   Intake/Output: I/O last 3 completed shifts: In: 6378.8 [P.O.:860; I.V.:5439.4; IV Piggyback:79.4] Out: 2275 [Urine:2275]  Intake/Output this shift:  No intake/output data recorded. Weight change: 1.9 kg Gen: awake and alert, pleasant, NAD CVS: bradycardic, no rub Resp: cta Abd: +BS, soft, NT Ext: trace dependent edema  Recent Labs  Lab 06/12/18 1334 06/13/18 0437 06/14/18 0443 06/15/18 0448  NA 139 140 138 140  K 5.5* 4.6 5.0 4.6  CL 100 102 101 107  CO2 22 25 24 26   GLUCOSE 182* 105* 107* 101*  BUN 48* 49* 46* 41*  CREATININE 3.18* 3.62* 3.83* 3.06*  ALBUMIN 3.3*  --   --  2.5*  CALCIUM 9.1 9.0 8.5* 8.6*  PHOS  --   --   --  4.6  AST 27  --   --   --   ALT 22  --   --   --    Liver Function Tests: Recent Labs  Lab 06/12/18 1334 06/15/18 0448  AST 27  --   ALT 22  --   ALKPHOS 80  --   BILITOT 0.6  --   PROT 7.0  --   ALBUMIN 3.3* 2.5*   No results for input(s): LIPASE, AMYLASE in the last 168 hours. No results for input(s): AMMONIA in the last 168 hours. CBC: Recent Labs  Lab 06/12/18 1334 06/13/18 0437 06/14/18 0443 06/15/18 0448  WBC 11.0* 10.5 7.6 5.1  NEUTROABS 8.5*  --   --   --   HGB 10.9* 9.9* 9.1* 8.7*  HCT 35.1* 32.1* 29.6* 29.1*  MCV 96.7 97.6 96.7 98.6  PLT 352 291 268 268   Cardiac Enzymes: Recent Labs  Lab 06/12/18 1334  TROPONINI <0.03   CBG: Recent Labs  Lab 06/14/18 0721 06/14/18 1129  06/14/18 1650 06/14/18 2003 06/15/18 0809  GLUCAP 114* 95 96 152* 174*    Iron Studies: No results for input(s): IRON, TIBC, TRANSFERRIN, FERRITIN in the last 72 hours. Studies/Results: US Renal  Result Date: 06/13/2018 CLINICAL DATA:  Acute renal failure.  UTI. EXAM: RENAL / URINARY TRACT ULTRASOUND COMPLETE COMPARISON:  CT 04/22/2018 FINDINGS: Exam difficult due to altered mental status and by habitus. Right Kidney: Renal measurements: 8.8 x 5.0 x 5.0 cm = volume: 116 mL . Echogenicity within normal limits. No mass or hydronephrosis visualized. Left Kidney: Renal measurements: 8.6 x 4.0 x 4.6 cm = volume: 82 mL. Echogenicity within normal limits. No mass or hydronephrosis visualized. Bladder: Not visualized. IMPRESSION: Kidneys at the lower limits of normal size with left smaller than right. No hydronephrosis or focal mass. Electronically Signed   By: Marin Olp M.D.   On: 06/13/2018 13:02   . aspirin EC  81 mg Oral Q breakfast  . Chlorhexidine Gluconate Cloth  6 each Topical Daily  . heparin  5,000 Units Subcutaneous Q8H  . insulin aspart  0-9 Units Subcutaneous TID WC  . levothyroxine  50 mcg Oral QAC breakfast  . mouth rinse  15 mL Mouth Rinse BID  . midodrine  10 mg Oral TID WC  . oxybutynin  10 mg Oral q morning - 10a  . pantoprazole  40 mg Oral Daily    BMET    Component Value Date/Time   NA 140 06/15/2018 0448   K 4.6 06/15/2018 0448   CL 107 06/15/2018 0448   CO2 26 06/15/2018 0448   GLUCOSE 101 (H) 06/15/2018 0448   BUN 41 (H) 06/15/2018 0448   CREATININE 3.06 (H) 06/15/2018 0448   CALCIUM 8.6 (L) 06/15/2018 0448   GFRNONAA 15 (L) 06/15/2018 0448   GFRAA 18 (L) 06/15/2018 0448   CBC    Component Value Date/Time   WBC 5.1 06/15/2018 0448   RBC 2.95 (L) 06/15/2018 0448   HGB 8.7 (L) 06/15/2018 0448   HCT 29.1 (L) 06/15/2018 0448   PLT 268 06/15/2018 0448   MCV 98.6 06/15/2018 0448   MCH 29.5 06/15/2018 0448   MCHC 29.9 (L) 06/15/2018 0448   RDW 14.6  06/15/2018 0448   LYMPHSABS 1.5 06/12/2018 1334   MONOABS 0.9 06/12/2018 1334   EOSABS 0.0 06/12/2018 1334   BASOSABS 0.1 06/12/2018 1334     Assessment/Plan:  1. 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  UOP and Cr have improved.  Pt's sister reports that she does have a kidney stone and is to have lithotripsy by Dr. Alyson Ingles of [facility] to "break it up".   She has had multiple episodes of AKI/CKD related to decompensated CHF and cardiorenal syndrome.  Unclear baseline Cr but appears more recently to be 1.8-2.2.  No indication for dialysis at this time and will continue to follow UOP and Scr 2. Klebsiella urosepsis- presented with AMS.  Improved and at baseline per her sister and previous hospital visits.  On levofloxacin adjusted for renal function 3. Hypotension/sepsis- on midodrine.  Continue to follow but may require pressors 4. Hyperkalemia- resolved with IVF's 5. Obstructive uropathy- followed by Dr. Alyson Ingles of [facility] and s/p double-J stent and for repeat placement and stone extraction per her sister.  6. Chronic diastolic CHF- stable.  Discussed with her sister the need for fluid restriction after discharge of 40-60 oz and sodium of 2000 mg/day 7. Anemia- due to acute illness as well as some chronic anemia due to CKD.  Cont to follow and check iron stores.  Transfuse prn.  Hold off on ESA for now.   Donetta Potts, MD [employer] [phone]

## 2018-06-15 NOTE — Care Management Important Message (Signed)
Important Message  Patient Details  Name: Rachel Morgan MRN: [id number] Date of Birth: 04/20/1950   Medicare Important Message Given:  Other (see comment)(verbal was given to guardian on 06/12/18 per notes- copy was placed at bedside)    Tommy Medal 06/15/2018, 3:24 PM

## 2018-06-15 NOTE — Progress Notes (Signed)
PROGRESS NOTE    Rachel Morgan  GYF:[id number] DOB: 1950-10-21 DOA: 06/12/2018 PCP: [facility]   Brief Narrative:  Per HPI: Rachel Henigan Wilsonis a 68 y.o.femalewith medical history significant formorbid obesity, MR, hypertension, hypothyroidism, CKD stage III, chronic diastolic heart failure with EF 60%, GERD, nephrolithiasis with prior stent, and anxiety who was brought to the ED with some altered mental status which occurred earlier this morning. This was accompanied with some epigastric abdominal pain. Her sister is a primary historian at the bedside and states that she was also noted to have some mild nausea. She had an episode of emesis in the emergency department. Of note, patient was recently discharged on 6/1 after treatment for acute on chronic diastolic heart failure. She was previously noted to have nephrolithiasis and underwent ureteral stent placement as well. No symptoms of dysuria or hematuria noted.  Patient was admitted with severe sepsis secondary to UTI along with noted AKI. She was also noted to have some nausea and vomiting.  Her symptoms are beginning to resolve and she has no further encephalopathy or nausea and vomiting and is tolerating diet.  Unfortunately, she continues to have upward trend in her creatinine levels with some oliguria noted yesterday.  Her blood pressures remain soft, but have responded to use of midodrine on 6/6.  She is noted to have increasing urine output overnight on 6/7 with improving creatinine levels noted as well.  Blood pressures continue to remain soft.   Assessment & Plan:   Principal Problem:   Sepsis ([place]) Active Problems:   Hypothyroidism   Hypertension   GERD (gastroesophageal reflux disease)   Hyperkalemia   UTI (urinary tract infection)   Acute renal failure superimposed on stage 3 chronic kidney disease (HCC)   Morbid obesity with BMI of 50.0-59.9, adult (HCC)   Severe sepsis secondary to  Klebsiella UTI-improving -Blood cultures with no growth for last 3 days -Maintain on IV fluidwith normal saline as recommended by nephrology -Midodrine added on 6/6 to help improve blood pressure readings and still has soft blood pressure readings -Continue IV Levaquin -Avoid blood pressure agents  AKI on CKD stage III-IV with oliguria-improving (baseline creatinine 1.8-2.2) -Avoid nephrotoxic agents -Renal ultrasound negative -Urine electrolytes suggestive of intrinsic renal process/ATN -Midodrineto assist with blood pressure readings -Repeat renal panel in a.m. -Renal consultation appreciated, continue current management -Monitor strict I's and O's now with Foley catheter placed 6/6 due to urinary incontinence.  Plan to discontinue today as urine output appears to have picked up. -No significant hyperkalemia or acidosis currently noted  Hyperkalemia secondary to AKI -Resolved -Continue monitoring and repeat BMP  Acute encephalopathy- resolved and at baseline and patient with MR -Secondary to sepsis  Nausea and vomiting- resolved -Zofran as needed -Maintain heart healthy diet  Chronic diastolic heart failure -Currently without any acute symptomatology -Monitor daily weights with recent weight on discharge 6/1 117 kg, current weight appears to be the same -Patient is currently up 4 kg in weight and will monitor closely -No significant dyspnea or increased oxygenation requirements noted, currently on 2 L -Mild edema peripherally  GERD -PPI  Hypothyroidism -Synthroid  Hypertension -Currently with soft blood pressure readings; avoid antihypertensives -Started on midodrine 6/6  MR -Sister at bedside to assist with history  Morbid obesity -Lifestyle changes   DVT prophylaxis:Heparin Code Status:Full code Family Communication:Sister, Rachel Morgan at bedside Disposition Plan:Continue IV fluid and use midodrine to assist with blood pressure support.  Continue on Levaquin for Klebsiella UTI.  Continue monitoring in stepdown unit due to soft blood pressure readings.  DC Foley catheter today.   Consultants:  Nephrology  Procedures:  None  Antimicrobials:  Levaquin 6/5->  Subjective: Patient seen and evaluated today with no new acute complaints or concerns. No acute concerns or events noted overnight.  She is alert and awake and sister, Rachel Morgan at bedside states that she slept well overnight with Ambien given.  Objective: Vitals:   06/15/18 0500 06/15/18 0600 06/15/18 0806 06/15/18 0828  BP: (!) 81/37 (!) 85/43    Pulse: (!) 49 (!) 54    Resp: 18 16    Temp:   98.2 F (36.8 C)   TempSrc:   Oral   SpO2: 100% 96%  96%  Weight: 123.7 kg     Height:        Intake/Output Summary (Last 24 hours) at 06/15/2018 0904 Last data filed at 06/15/2018 0500 Gross per 24 hour  Intake 6378.75 ml  Output 1950 ml  Net 4428.75 ml   Filed Weights   06/13/18 0400 06/14/18 0500 06/15/18 0500  Weight: 119.2 kg 121.8 kg 123.7 kg    Examination:  General exam: Appears calm and comfortable, MR, morbidly obese Respiratory system: Clear to auscultation. Respiratory effort normal.  On 2 L nasal cannula. Cardiovascular system: S1 & S2 heard, RRR. No JVD, murmurs, rubs, gallops or clicks. No pedal edema. Gastrointestinal system: Abdomen is nondistended, soft and nontender. No organomegaly or masses felt. Normal bowel sounds heard. Central nervous system: Alert and awake. Extremities: Symmetric 5 x 5 power. Skin: No rashes, lesions or ulcers Psychiatry: Cannot be assessed.    Data Reviewed: I have personally reviewed following labs and imaging studies  CBC: Recent Labs  Lab 06/12/18 1334 06/13/18 0437 06/14/18 0443 06/15/18 0448  WBC 11.0* 10.5 7.6 5.1  NEUTROABS 8.5*  --   --   --   HGB 10.9* 9.9* 9.1* 8.7*  HCT 35.1* 32.1* 29.6* 29.1*  MCV 96.7 97.6 96.7 98.6  PLT 352 291 268 016   Basic Metabolic Panel: Recent Labs   Lab 06/12/18 1334 06/13/18 0437 06/14/18 0443 06/15/18 0448  NA 139 140 138 140  K 5.5* 4.6 5.0 4.6  CL 100 102 101 107  CO2 22 25 24 26   GLUCOSE 182* 105* 107* 101*  BUN 48* 49* 46* 41*  CREATININE 3.18* 3.62* 3.83* 3.06*  CALCIUM 9.1 9.0 8.5* 8.6*  MG  --  1.9  --   --   PHOS  --   --   --  4.6   GFR: Estimated Creatinine Clearance: 21.5 mL/min (A) (by C-G formula based on SCr of 3.06 mg/dL (H)). Liver Function Tests: Recent Labs  Lab 06/12/18 1334 06/15/18 0448  AST 27  --   ALT 22  --   ALKPHOS 80  --   BILITOT 0.6  --   PROT 7.0  --   ALBUMIN 3.3* 2.5*   No results for input(s): LIPASE, AMYLASE in the last 168 hours. No results for input(s): AMMONIA in the last 168 hours. Coagulation Profile: No results for input(s): INR, PROTIME in the last 168 hours. Cardiac Enzymes: Recent Labs  Lab 06/12/18 1334  TROPONINI <0.03   BNP (last 3 results) No results for input(s): PROBNP in the last 8760 hours. HbA1C: No results for input(s): HGBA1C in the last 72 hours. CBG: Recent Labs  Lab 06/14/18 0721 06/14/18 1129 06/14/18 1650 06/14/18 2003 06/15/18 0809  GLUCAP 114* 95 96 152* 174*   Lipid  Profile: No results for input(s): CHOL, HDL, LDLCALC, TRIG, CHOLHDL, LDLDIRECT in the last 72 hours. Thyroid Function Tests: No results for input(s): TSH, T4TOTAL, FREET4, T3FREE, THYROIDAB in the last 72 hours. Anemia Panel: No results for input(s): VITAMINB12, FOLATE, FERRITIN, TIBC, IRON, RETICCTPCT in the last 72 hours. Sepsis Labs: Recent Labs  Lab 06/12/18 1335 06/12/18 1511 06/13/18 0437  LATICACIDVEN 2.1* 1.1 1.3    Recent Results (from the past 240 hour(s))  Urine Culture     Status: Abnormal   Collection Time: 06/12/18 12:47 PM  Result Value Ref Range Status   Specimen Description   Final    URINE, CLEAN CATCH Performed at [facility], [address] [place], [place] [address]    Special Requests   Final    NONE Performed at [facility],  [address], [place], [place] [address]    Culture >=100,000 COLONIES/mL KLEBSIELLA PNEUMONIAE (A)  Final   Report Status 06/15/2018 FINAL  Final   Organism ID, Bacteria KLEBSIELLA PNEUMONIAE (A)  Final      Susceptibility   Klebsiella pneumoniae - MIC*    AMPICILLIN >=32 RESISTANT Resistant     CEFAZOLIN <=4 SENSITIVE Sensitive     CEFTRIAXONE <=1 SENSITIVE Sensitive     CIPROFLOXACIN <=0.25 SENSITIVE Sensitive     GENTAMICIN <=1 SENSITIVE Sensitive     IMIPENEM <=0.25 SENSITIVE Sensitive     NITROFURANTOIN 128 RESISTANT Resistant     TRIMETH/SULFA <=20 SENSITIVE Sensitive     AMPICILLIN/SULBACTAM 8 SENSITIVE Sensitive     PIP/TAZO 8 SENSITIVE Sensitive     Extended ESBL NEGATIVE Sensitive     * >=100,000 COLONIES/mL KLEBSIELLA PNEUMONIAE  SARS Coronavirus 2 (CEPHEID- Performed in [place] hospital lab), Hosp Order     Status: None   Collection Time: 06/12/18 12:51 PM  Result Value Ref Range Status   SARS Coronavirus 2 NEGATIVE NEGATIVE Final    Comment: (NOTE) If result is NEGATIVE SARS-CoV-2 target nucleic acids are NOT DETECTED. The SARS-CoV-2 RNA is generally detectable in upper and lower  respiratory specimens during the acute phase of infection. The lowest  concentration of SARS-CoV-2 viral copies this assay can detect is 250  copies / mL. A negative result does not preclude SARS-CoV-2 infection  and should not be used as the sole basis for treatment or other  patient management decisions.  A negative result may occur with  improper specimen collection / handling, submission of specimen other  than nasopharyngeal swab, presence of viral mutation(s) within the  areas targeted by this assay, and inadequate number of viral copies  (<250 copies / mL). A negative result must be combined with clinical  observations, patient history, and epidemiological information. If result is POSITIVE SARS-CoV-2 target nucleic acids are DETECTED. The SARS-CoV-2 RNA is generally detectable in  upper and lower  respiratory specimens dur ing the acute phase of infection.  Positive  results are indicative of active infection with SARS-CoV-2.  Clinical  correlation with patient history and other diagnostic information is  necessary to determine patient infection status.  Positive results do  not rule out bacterial infection or co-infection with other viruses. If result is PRESUMPTIVE POSTIVE SARS-CoV-2 nucleic acids MAY BE PRESENT.   A presumptive positive result was obtained on the submitted specimen  and confirmed on repeat testing.  While 2019 novel coronavirus  (SARS-CoV-2) nucleic acids may be present in the submitted sample  additional confirmatory testing may be necessary for epidemiological  and / or clinical management purposes  to differentiate between  SARS-CoV-2 and other Sarbecovirus currently known to infect humans.  If clinically indicated additional testing with an alternate test  methodology [phone]) is advised. The SARS-CoV-2 RNA is generally  detectable in upper and lower respiratory sp ecimens during the acute  phase of infection. The expected result is Negative. Fact Sheet for Patients:  StrictlyIdeas.no Fact Sheet for Healthcare Providers: BankingDealers.co.za This test is not yet approved or cleared by the [place] FDA and has been authorized for detection and/or diagnosis of SARS-CoV-2 by FDA under an Emergency Use Authorization (EUA).  This EUA will remain in effect (meaning this test can be used) for the duration of the COVID-19 declaration under Section 564(b)(1) of the Act, 21 U.S.C. section 360bbb-3(b)(1), unless the authorization is terminated or revoked sooner. Performed at [facility], [address], [place], [place] [address]   Blood Culture (routine x 2)     Status: None (Preliminary result)   Collection Time: 06/12/18  1:24 PM  Result Value Ref Range Status   Specimen Description BLOOD  LEFT ARM  Final   Special Requests   Final    BOTTLES DRAWN AEROBIC AND ANAEROBIC Blood Culture adequate volume   Culture   Final    NO GROWTH 3 DAYS Performed at [facility], [address], [place], [place] [address]    Report Status PENDING  Incomplete  Blood Culture (routine x 2)     Status: None (Preliminary result)   Collection Time: 06/12/18  1:35 PM  Result Value Ref Range Status   Specimen Description BLOOD LEFT ARM  Final   Special Requests   Final    BOTTLES DRAWN AEROBIC AND ANAEROBIC Blood Culture results may not be optimal due to an inadequate volume of blood received in culture bottles   Culture   Final    NO GROWTH 3 DAYS Performed at [facility], [address], [place], [place] [address]    Report Status PENDING  Incomplete  MRSA PCR Screening     Status: None   Collection Time: 06/12/18  7:47 PM  Result Value Ref Range Status   MRSA by PCR NEGATIVE NEGATIVE Final    Comment:        The GeneXpert MRSA Assay (FDA approved for NASAL specimens only), is one component of a comprehensive MRSA colonization surveillance program. It is not intended to diagnose MRSA infection nor to guide or monitor treatment for MRSA infections. Performed at [facility], [address] [place], [place] [address]          Radiology Studies: US Renal  Result Date: 06/13/2018 CLINICAL DATA:  Acute renal failure.  UTI. EXAM: RENAL / URINARY TRACT ULTRASOUND COMPLETE COMPARISON:  CT 04/22/2018 FINDINGS: Exam difficult due to altered mental status and by habitus. Right Kidney: Renal measurements: 8.8 x 5.0 x 5.0 cm = volume: 116 mL . Echogenicity within normal limits. No mass or hydronephrosis visualized. Left Kidney: Renal measurements: 8.6 x 4.0 x 4.6 cm = volume: 82 mL. Echogenicity within normal limits. No mass or hydronephrosis visualized. Bladder: Not visualized. IMPRESSION: Kidneys at the lower limits of normal size with left smaller than right. No hydronephrosis or focal mass.  Electronically Signed   By: Marin Olp M.D.   On: 06/13/2018 13:02        Scheduled Meds: . aspirin EC  81 mg Oral Q breakfast  . Chlorhexidine Gluconate Cloth  6 each Topical Daily  . heparin  5,000 Units Subcutaneous Q8H  . insulin aspart  0-9 Units Subcutaneous TID WC  . levothyroxine  50 mcg Oral QAC breakfast  . mouth rinse  15 mL Mouth Rinse BID  . midodrine  10 mg Oral TID WC  . oxybutynin  10 mg Oral q morning - 10a  . pantoprazole  40 mg Oral Daily   Continuous Infusions: . sodium chloride 75 mL/hr at 06/15/18 0516  . levofloxacin (LEVAQUIN) IV 500 mg (06/14/18 1436)     LOS: 3 days    Time spent: 30 minutes    [facility] Darleen Crocker, DO [facility] Pager [phone]  If 7PM-7AM, please contact night-coverage [email or web address] Password [email or web address] 06/15/2018, 9:04 AM

## 2018-06-15 NOTE — Progress Notes (Signed)
Per Dr.Kim, continue to monitor

## 2018-06-15 NOTE — Progress Notes (Signed)
Pts BP 78/43, History of low BP, Pt receives midodrine with  Meals. Dr. Maudie Mercury paged and made aware. Waiting for orders/call back. Will continue to monitor

## 2018-06-15 NOTE — Progress Notes (Signed)
Pharmacy Antibiotic Note  Rachel Morgan is a 68 y.o. female admitted on 06/12/2018 with UTI.  Pharmacy has been consulted for Levaquin dosing. Patient improving, has reduced renal function and multiple allergies, plan to continue with levaquin  Plan: Continue Levaquin 500 mg IV every 48 hours. Transition to po as indicated Monitor labs, c/s, and patient improvement.  Height: 4\' 11"  (149.9 cm) Weight: 272 lb 11.3 oz (123.7 kg) IBW/kg (Calculated) : 43.2  Temp (24hrs), Avg:98.1 F (36.7 C), Min:97.7 F (36.5 C), Max:98.7 F (37.1 C)  Recent Labs  Lab 06/12/18 1334 06/12/18 1335 06/12/18 1511 06/13/18 0437 06/14/18 0443 06/15/18 0448  WBC 11.0*  --   --  10.5 7.6 5.1  CREATININE 3.18*  --   --  3.62* 3.83* 3.06*  LATICACIDVEN  --  2.1* 1.1 1.3  --   --     Estimated Creatinine Clearance: 21.5 mL/min (A) (by C-G formula based on SCr of 3.06 mg/dL (H)).    Allergies  Allergen Reactions  . Macrobid [Nitrofurantoin Monohyd Macro] Hives and Swelling  . Penicillins Swelling and Rash    Has patient had a PCN reaction causing immediate rash, facial/tongue/throat swelling, SOB or lightheadedness with hypotension: Yes Has patient had a PCN reaction causing severe rash involving mucus membranes or skin necrosis: Yes Has patient had a PCN reaction that required hospitalization: no Has patient had a PCN reaction occurring within the last 10 years: no If all of the above answers are "NO", then may proceed with Cephalosporin use. Patient tolerated rocephin and ertapenem in the past    Antimicrobials this admission: Levaquin 6/5 >>     Dose adjustments this admission: Levaquin  Microbiology results: 6/5 BCx: ngtd 6/5 UCx: >100,000 K. Pneumo s- cefazolin, ceftriaxone, septra, cipro R- amp 6/5 MRSA PCR: negative  Thank you for allowing pharmacy to be a part of this patient's care.  Isac Sarna, BS Pharm D, [place] Clinical Pharmacist Pager [phone] 06/15/2018 10:39 AM

## 2018-06-16 LAB — CBC
HCT: 31 % — ABNORMAL LOW (ref 36.0–46.0)
Hemoglobin: 9.1 g/dL — ABNORMAL LOW (ref 12.0–15.0)
MCH: 29.4 pg (ref 26.0–34.0)
MCHC: 29.4 g/dL — ABNORMAL LOW (ref 30.0–36.0)
MCV: 100.3 fL — ABNORMAL HIGH (ref 80.0–100.0)
Platelets: 287 10*3/uL (ref 150–400)
RBC: 3.09 MIL/uL — ABNORMAL LOW (ref 3.87–5.11)
RDW: 14.6 % (ref 11.5–15.5)
WBC: 5.4 10*3/uL (ref 4.0–10.5)
nRBC: 0 % (ref 0.0–0.2)

## 2018-06-16 LAB — RENAL FUNCTION PANEL
Albumin: 2.7 g/dL — ABNORMAL LOW (ref 3.5–5.0)
Anion gap: 7 (ref 5–15)
BUN: 36 mg/dL — ABNORMAL HIGH (ref 8–23)
CO2: 23 mmol/L (ref 22–32)
Calcium: 8.7 mg/dL — ABNORMAL LOW (ref 8.9–10.3)
Chloride: 111 mmol/L (ref 98–111)
Creatinine, Ser: 2.55 mg/dL — ABNORMAL HIGH (ref 0.44–1.00)
GFR calc Af Amer: 22 mL/min — ABNORMAL LOW (ref >60)
GFR calc non Af Amer: 19 mL/min — ABNORMAL LOW (ref >60)
Glucose, Bld: 100 mg/dL — ABNORMAL HIGH (ref 70–99)
Phosphorus: 4 mg/dL (ref 2.5–4.6)
Potassium: 4.7 mmol/L (ref 3.5–5.1)
Sodium: 141 mmol/L (ref 135–145)

## 2018-06-16 LAB — VITAMIN B12: Vitamin B-12: 565 pg/mL (ref 180–914)

## 2018-06-16 LAB — GLUCOSE, CAPILLARY
Glucose-Capillary: 102 mg/dL — ABNORMAL HIGH (ref 70–99)
Glucose-Capillary: 129 mg/dL — ABNORMAL HIGH (ref 70–99)
Glucose-Capillary: 109 mg/dL — ABNORMAL HIGH (ref 70–99)
Glucose-Capillary: 134 mg/dL — ABNORMAL HIGH (ref 70–99)

## 2018-06-16 LAB — IRON AND TIBC
Iron: 59 ug/dL (ref 28–170)
Saturation Ratios: 26 % (ref 10.4–31.8)
TIBC: 229 ug/dL — ABNORMAL LOW (ref 250–450)
UIBC: 170 ug/dL

## 2018-06-16 LAB — FERRITIN: Ferritin: 92 ng/mL (ref 11–307)

## 2018-06-16 NOTE — Progress Notes (Signed)
Patient ID: Rachel Morgan, female   DOB: 1950/05/27, 68 y.o.   MRN: [id number] S: Doing well, no events overnight. O:BP 128/66   Pulse 66   Temp 98.2 F (36.8 C) (Axillary)   Resp 18   Ht 4\' 11"  (1.499 m)   Wt 123.7 kg   SpO2 100%   BMI 55.08 kg/m   Intake/Output Summary (Last 24 hours) at 06/16/2018 0854 Last data filed at 06/16/2018 6270 Gross per 24 hour  Intake 2224.91 ml  Output 1250 ml  Net 974.91 ml   Intake/Output: I/O last 3 completed shifts: In: 3339.9 [P.O.:780; I.V.:2559.9] Out: 2450 [Urine:2450]  Intake/Output this shift:  No intake/output data recorded. Weight change: 0 kg Gen: NAD CVS:no rub Resp:cta with decreased BS at bases Abd: obese, +BS, soft Ext: trace edema  Recent Labs  Lab 06/12/18 1334 06/13/18 0437 06/14/18 0443 06/15/18 0448 06/16/18 0610  NA 139 140 138 140 141  K 5.5* 4.6 5.0 4.6 4.7  CL 100 102 101 107 111  CO2 22 25 24 26 23   GLUCOSE 182* 105* 107* 101* 100*  BUN 48* 49* 46* 41* 36*  CREATININE 3.18* 3.62* 3.83* 3.06* 2.55*  ALBUMIN 3.3*  --   --  2.5* 2.7*  CALCIUM 9.1 9.0 8.5* 8.6* 8.7*  PHOS  --   --   --  4.6 4.0  AST 27  --   --   --   --   ALT 22  --   --   --   --    Liver Function Tests: Recent Labs  Lab 06/12/18 1334 06/15/18 0448 06/16/18 0610  AST 27  --   --   ALT 22  --   --   ALKPHOS 80  --   --   BILITOT 0.6  --   --   PROT 7.0  --   --   ALBUMIN 3.3* 2.5* 2.7*   No results for input(s): LIPASE, AMYLASE in the last 168 hours. No results for input(s): AMMONIA in the last 168 hours. CBC: Recent Labs  Lab 06/12/18 1334 06/13/18 0437 06/14/18 0443 06/15/18 0448 06/16/18 0610  WBC 11.0* 10.5 7.6 5.1 5.4  NEUTROABS 8.5*  --   --   --   --   HGB 10.9* 9.9* 9.1* 8.7* 9.1*  HCT 35.1* 32.1* 29.6* 29.1* 31.0*  MCV 96.7 97.6 96.7 98.6 100.3*  PLT 352 291 268 268 287   Cardiac Enzymes: Recent Labs  Lab 06/12/18 1334  TROPONINI <0.03   CBG: Recent Labs  Lab 06/15/18 0809 06/15/18 1114  06/15/18 1614 06/15/18 2114 06/16/18 0751  GLUCAP 174* 87 124* 136* 102*    Iron Studies:  Recent Labs    06/16/18 0610  IRON 59  TIBC 229*  FERRITIN 92   Studies/Results: No results found. Marland Kitchen aspirin EC  81 mg Oral Q breakfast  . Chlorhexidine Gluconate Cloth  6 each Topical Daily  . heparin  5,000 Units Subcutaneous Q8H  . insulin aspart  0-9 Units Subcutaneous TID WC  . levothyroxine  50 mcg Oral QAC breakfast  . mouth rinse  15 mL Mouth Rinse BID  . midodrine  10 mg Oral TID WC  . oxybutynin  10 mg Oral q morning - 10a  . pantoprazole  40 mg Oral Daily    BMET    Component Value Date/Time   NA 141 06/16/2018 0610   K 4.7 06/16/2018 0610   CL 111 06/16/2018 0610   CO2 23 06/16/2018 0610  GLUCOSE 100 (H) 06/16/2018 0610   BUN 36 (H) 06/16/2018 0610   CREATININE 2.55 (H) 06/16/2018 0610   CALCIUM 8.7 (L) 06/16/2018 0610   GFRNONAA 19 (L) 06/16/2018 0610   GFRAA 22 (L) 06/16/2018 0610   CBC    Component Value Date/Time   WBC 5.4 06/16/2018 0610   RBC 3.09 (L) 06/16/2018 0610   HGB 9.1 (L) 06/16/2018 0610   HCT 31.0 (L) 06/16/2018 0610   PLT 287 06/16/2018 0610   MCV 100.3 (H) 06/16/2018 0610   MCH 29.4 06/16/2018 0610   MCHC 29.4 (L) 06/16/2018 0610   RDW 14.6 06/16/2018 0610   LYMPHSABS 1.5 06/12/2018 1334   MONOABS 0.9 06/12/2018 1334   EOSABS 0.0 06/12/2018 1334   BASOSABS 0.1 06/12/2018 1334   Assessment/Plan:  1. 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  UOP and Cr have improved.  Pt's sister reports that she does have a kidney stone and is to have lithotripsy by Dr. Alyson Ingles of [facility] to "break it up".   She has had multiple episodes of AKI/CKD related to decompensated CHF and cardiorenal syndrome.  Unclear baseline Cr but appears more recently to be 1.8-2.2.   1. Cr continues to improve with IVF's 2. Given her history of CHF will decrease rate of IVF"s 3. No indication for dialysis at this  time and will continue to follow UOP and Scr 2. Klebsiella urosepsis- presented with AMS.  Improved and at baseline per her sister and previous hospital visits.  On levofloxacin adjusted for renal function 3. Hypotension/sepsis- on midodrine.  BP's better today 4. Hyperkalemia- resolved with IVF's 5. Obstructive uropathy- followed by Dr. Alyson Ingles of [facility] and s/p double-J stent and for repeat placement and stone extraction per her sister.  6. Chronic diastolic CHF- stable.  Discussed with her sister the need for fluid restriction after discharge of 40-60 oz and sodium of 2000 mg/day 1. Decrease IVF's 7. Anemia- due to acute illness as well as some chronic anemia due to CKD.  Iron stores at goal.  Transfuse prn.  Hold off on ESA for now.   Donetta Potts, MD [employer] [phone]

## 2018-06-16 NOTE — Progress Notes (Signed)
PROGRESS NOTE    Rachel Morgan  DVV:[id number] DOB: May 22, 1950 DOA: 06/12/2018 PCP: [facility]   Brief Narrative:  Per HPI: Rachel Fenster Wilsonis a 68 y.o.femalewith medical history significant formorbid obesity, MR, hypertension, hypothyroidism, CKD stage III, chronic diastolic heart failure with EF 60%, GERD, nephrolithiasis with prior stent, and anxiety who was brought to the ED with some altered mental status which occurred earlier this morning. This was accompanied with some epigastric abdominal pain. Her sister is a primary historian at the bedside and states that she was also noted to have some mild nausea. She had an episode of emesis in the emergency department. Of note, patient was recently discharged on 6/1 after treatment for acute on chronic diastolic heart failure. She was previously noted to have nephrolithiasis and underwent ureteral stent placement as well. No symptoms of dysuria or hematuria noted.  Patient was admitted with severe sepsis secondary to Klebsiella UTI/pyelonephritis along with noted AKI. She was also noted to have some nausea and vomiting.Her symptoms are beginning to resolve and she has no further encephalopathy or nausea and vomiting and is tolerating diet. Unfortunately, she continues to have upward trend in her creatinine levels with some oliguria noted yesterday. Her blood pressures remain soft, but have responded to use of midodrine on 6/6.  She is noted to have increasing urine output overnight on 6/7 with improving creatinine levels noted as well.  Blood pressures continue to remain soft, but stable on midodrine. Ok to move to [employer] today.   Assessment & Plan:   Principal Problem:   Sepsis ([place]) Active Problems:   Hypothyroidism   Hypertension   GERD (gastroesophageal reflux disease)   Hyperkalemia   UTI (urinary tract infection)   Acute renal failure superimposed on stage 3 chronic kidney disease (HCC)  Morbid obesity with BMI of 50.0-59.9, adult (HCC)   Severe sepsis secondary to Klebsiella UTI/pyelonephritis-improving -Blood cultures with no growth for last 4 days -Maintain on IV fluidwith normal saline as recommended by nephrology, now decreased rate -Midodrine added on 6/6 to help improve blood pressure readings and still has soft blood pressure readings-but stable and ok to move to medsurg -ContinueIV Levaquin -Avoid blood pressure agents  AKI on CKD stage III-IVwith oliguria-improving (baseline creatinine 1.8-2.2) -Avoid nephrotoxic agents -Renal ultrasound negative -Urine electrolytes suggestive of intrinsic renal process/ATN -Midodrineto assist with blood pressure readings -Repeat renal panel in a.m. -Renal consultation appreciated, continue current management and follow for improvement for one more day -Monitor strict I's and O's with foley dc on 6/8 -No significant hyperkalemia or acidosis currently noted  Hyperkalemia secondary to AKI -Resolved -Continue monitoring and repeat BMP  Acute encephalopathy-resolved and at baseline and patient with MR -Secondary to sepsis  Nausea and vomiting-resolved -Zofran as needed -Maintain heart healthy diet  Chronic diastolic heart failure -Currently without any acute symptomatology -Monitor daily weights with recent weight on discharge 6/1 117 kg, current weight appears to be the same -Patient is currently up4kg in weight and will monitor closely -No significant dyspnea or increased oxygenation requirements noted, currently on 2 L which she wears chronically at home -Mild edema peripherally  GERD -PPI  Hypothyroidism -Synthroid  Hypertension -Currently with soft blood pressure readings; avoid antihypertensives -Started onmidodrine6/6  MR -Sister at bedside to assist with history  Morbid obesity -Lifestyle changes   DVT prophylaxis:Heparin Code Status:Full code Family Communication:Sister,  [name] at bedside Disposition Plan:Transfer to [place] today and anticipate DC in a.m. if okay with nephrology.  Kidney  function continues to improve.  Will likely require full 7-day course of Levaquin as she may have a component pyelonephritis which is difficult to ascertain.   Consultants:  Nephrology  Procedures:  None  Antimicrobials:  Levaquin 6/5->  Subjective: Patient seen and evaluated today with no new acute complaints or concerns. No acute concerns or events noted overnight.  She has continued to have good urine output overnight with no fever and stable blood pressure readings.  Objective: Vitals:   06/16/18 0700 06/16/18 0749 06/16/18 0800 06/16/18 1000  BP: (!) 112/55  128/66 (!) 107/48  Pulse: (!) 55  66 64  Resp: 17  18 19   Temp:  98.2 F (36.8 C)    TempSrc:  Axillary    SpO2: 100%  100% 100%  Weight:      Height:        Intake/Output Summary (Last 24 hours) at 06/16/2018 1124 Last data filed at 06/16/2018 1020 Gross per 24 hour  Intake 2620.31 ml  Output 1250 ml  Net 1370.31 ml   Filed Weights   06/14/18 0500 06/15/18 0500 06/16/18 0500  Weight: 121.8 kg 123.7 kg 123.7 kg    Examination:  General exam: Appears calm and comfortable, morbid obesity, MR Respiratory system: Clear to auscultation. Respiratory effort normal. Currently on 2L [place]. Cardiovascular system: S1 & S2 heard, RRR. No JVD, murmurs, rubs, gallops or clicks. No pedal edema.  Gastrointestinal system: Abdomen is nondistended, soft and nontender. No organomegaly or masses felt. Normal bowel sounds heard. Central nervous system: Alert and awake.  Pleasant Extremities: Symmetric 5 x 5 power. Skin: No rashes, lesions or ulcers Psychiatry: Difficult to assess given patient condition.    Data Reviewed: I have personally reviewed following labs and imaging studies  CBC: Recent Labs  Lab 06/12/18 1334 06/13/18 0437 06/14/18 0443 06/15/18 0448 06/16/18 0610  WBC 11.0* 10.5  7.6 5.1 5.4  NEUTROABS 8.5*  --   --   --   --   HGB 10.9* 9.9* 9.1* 8.7* 9.1*  HCT 35.1* 32.1* 29.6* 29.1* 31.0*  MCV 96.7 97.6 96.7 98.6 100.3*  PLT 352 291 268 268 716   Basic Metabolic Panel: Recent Labs  Lab 06/12/18 1334 06/13/18 0437 06/14/18 0443 06/15/18 0448 06/16/18 0610  NA 139 140 138 140 141  K 5.5* 4.6 5.0 4.6 4.7  CL 100 102 101 107 111  CO2 22 25 24 26 23   GLUCOSE 182* 105* 107* 101* 100*  BUN 48* 49* 46* 41* 36*  CREATININE 3.18* 3.62* 3.83* 3.06* 2.55*  CALCIUM 9.1 9.0 8.5* 8.6* 8.7*  MG  --  1.9  --   --   --   PHOS  --   --   --  4.6 4.0   GFR: Estimated Creatinine Clearance: 25.8 mL/min (A) (by C-G formula based on SCr of 2.55 mg/dL (H)). Liver Function Tests: Recent Labs  Lab 06/12/18 1334 06/15/18 0448 06/16/18 0610  AST 27  --   --   ALT 22  --   --   ALKPHOS 80  --   --   BILITOT 0.6  --   --   PROT 7.0  --   --   ALBUMIN 3.3* 2.5* 2.7*   No results for input(s): LIPASE, AMYLASE in the last 168 hours. No results for input(s): AMMONIA in the last 168 hours. Coagulation Profile: No results for input(s): INR, PROTIME in the last 168 hours. Cardiac Enzymes: Recent Labs  Lab 06/12/18 1334  TROPONINI <0.03  BNP (last 3 results) No results for input(s): PROBNP in the last 8760 hours. HbA1C: No results for input(s): HGBA1C in the last 72 hours. CBG: Recent Labs  Lab 06/15/18 0809 06/15/18 1114 06/15/18 1614 06/15/18 2114 06/16/18 0751  GLUCAP 174* 87 124* 136* 102*   Lipid Profile: No results for input(s): CHOL, HDL, LDLCALC, TRIG, CHOLHDL, LDLDIRECT in the last 72 hours. Thyroid Function Tests: No results for input(s): TSH, T4TOTAL, FREET4, T3FREE, THYROIDAB in the last 72 hours. Anemia Panel: Recent Labs    06/16/18 0610  VITAMINB12 565  FERRITIN 92  TIBC 229*  IRON 59   Sepsis Labs: Recent Labs  Lab 06/12/18 1335 06/12/18 1511 06/13/18 0437  LATICACIDVEN 2.1* 1.1 1.3    Recent Results (from the past 240  hour(s))  Urine Culture     Status: Abnormal   Collection Time: 06/12/18 12:47 PM  Result Value Ref Range Status   Specimen Description   Final    URINE, CLEAN CATCH Performed at [facility], [address] [place], [place] [address]    Special Requests   Final    NONE Performed at [facility], [address], [place], [place] [address]    Culture >=100,000 COLONIES/mL KLEBSIELLA PNEUMONIAE (A)  Final   Report Status 06/15/2018 FINAL  Final   Organism ID, Bacteria KLEBSIELLA PNEUMONIAE (A)  Final      Susceptibility   Klebsiella pneumoniae - MIC*    AMPICILLIN >=32 RESISTANT Resistant     CEFAZOLIN <=4 SENSITIVE Sensitive     CEFTRIAXONE <=1 SENSITIVE Sensitive     CIPROFLOXACIN <=0.25 SENSITIVE Sensitive     GENTAMICIN <=1 SENSITIVE Sensitive     IMIPENEM <=0.25 SENSITIVE Sensitive     NITROFURANTOIN 128 RESISTANT Resistant     TRIMETH/SULFA <=20 SENSITIVE Sensitive     AMPICILLIN/SULBACTAM 8 SENSITIVE Sensitive     PIP/TAZO 8 SENSITIVE Sensitive     Extended ESBL NEGATIVE Sensitive     * >=100,000 COLONIES/mL KLEBSIELLA PNEUMONIAE  SARS Coronavirus 2 (CEPHEID- Performed in [facility] hospital lab), Hosp Order     Status: None   Collection Time: 06/12/18 12:51 PM  Result Value Ref Range Status   SARS Coronavirus 2 NEGATIVE NEGATIVE Final    Comment: (NOTE) If result is NEGATIVE SARS-CoV-2 target nucleic acids are NOT DETECTED. The SARS-CoV-2 RNA is generally detectable in upper and lower  respiratory specimens during the acute phase of infection. The lowest  concentration of SARS-CoV-2 viral copies this assay can detect is 250  copies / mL. A negative result does not preclude SARS-CoV-2 infection  and should not be used as the sole basis for treatment or other  patient management decisions.  A negative result may occur with  improper specimen collection / handling, submission of specimen other  than nasopharyngeal swab, presence of viral mutation(s) within the  areas  targeted by this assay, and inadequate number of viral copies  (<250 copies / mL). A negative result must be combined with clinical  observations, patient history, and epidemiological information. If result is POSITIVE SARS-CoV-2 target nucleic acids are DETECTED. The SARS-CoV-2 RNA is generally detectable in upper and lower  respiratory specimens dur ing the acute phase of infection.  Positive  results are indicative of active infection with SARS-CoV-2.  Clinical  correlation with patient history and other diagnostic information is  necessary to determine patient infection status.  Positive results do  not rule out bacterial infection or co-infection with other viruses. If result is PRESUMPTIVE POSTIVE SARS-CoV-2  nucleic acids MAY BE PRESENT.   A presumptive positive result was obtained on the submitted specimen  and confirmed on repeat testing.  While 2019 novel coronavirus  (SARS-CoV-2) nucleic acids may be present in the submitted sample  additional confirmatory testing may be necessary for epidemiological  and / or clinical management purposes  to differentiate between  SARS-CoV-2 and other Sarbecovirus currently known to infect humans.  If clinically indicated additional testing with an alternate test  methodology [phone]) is advised. The SARS-CoV-2 RNA is generally  detectable in upper and lower respiratory sp ecimens during the acute  phase of infection. The expected result is Negative. Fact Sheet for Patients:  StrictlyIdeas.no Fact Sheet for Healthcare Providers: BankingDealers.co.za This test is not yet approved or cleared by the [place] FDA and has been authorized for detection and/or diagnosis of SARS-CoV-2 by FDA under an Emergency Use Authorization (EUA).  This EUA will remain in effect (meaning this test can be used) for the duration of the COVID-19 declaration under Section 564(b)(1) of the Act, 21 U.S.C. section  360bbb-3(b)(1), unless the authorization is terminated or revoked sooner. Performed at [facility], [address], [place], [place] [address]   Blood Culture (routine x 2)     Status: None (Preliminary result)   Collection Time: 06/12/18  1:24 PM  Result Value Ref Range Status   Specimen Description BLOOD LEFT ARM  Final   Special Requests   Final    BOTTLES DRAWN AEROBIC AND ANAEROBIC Blood Culture adequate volume   Culture   Final    NO GROWTH 4 DAYS Performed at [facility], [address] [place], [place] [address]    Report Status PENDING  Incomplete  Blood Culture (routine x 2)     Status: None (Preliminary result)   Collection Time: 06/12/18  1:35 PM  Result Value Ref Range Status   Specimen Description BLOOD LEFT ARM  Final   Special Requests   Final    BOTTLES DRAWN AEROBIC AND ANAEROBIC Blood Culture results may not be optimal due to an inadequate volume of blood received in culture bottles   Culture   Final    NO GROWTH 4 DAYS Performed at [facility], [address] [place], [place] [address]    Report Status PENDING  Incomplete  MRSA PCR Screening     Status: None   Collection Time: 06/12/18  7:47 PM  Result Value Ref Range Status   MRSA by PCR NEGATIVE NEGATIVE Final    Comment:        The GeneXpert MRSA Assay (FDA approved for NASAL specimens only), is one component of a comprehensive MRSA colonization surveillance program. It is not intended to diagnose MRSA infection nor to guide or monitor treatment for MRSA infections. Performed at [facility], [address] [place], [place] [address]          Radiology Studies: No results found.      Scheduled Meds: . aspirin EC  81 mg Oral Q breakfast  . Chlorhexidine Gluconate Cloth  6 each Topical Daily  . heparin  5,000 Units Subcutaneous Q8H  . insulin aspart  0-9 Units Subcutaneous TID WC  . levothyroxine  50 mcg Oral QAC breakfast  . mouth rinse  15 mL Mouth Rinse BID  . midodrine  10 mg  Oral TID WC  . oxybutynin  10 mg Oral q morning - 10a  . pantoprazole  40 mg Oral Daily   Continuous Infusions: . sodium chloride 25 mL/hr  at 06/16/18 1020  . levofloxacin (LEVAQUIN) IV 500 mg (06/14/18 1436)     LOS: 4 days    Time spent: 30 minutes    Pratik Darleen Crocker, DO [facility] Pager [phone]  If 7PM-7AM, please contact night-coverage [email or web address] Password [email or web address] 06/16/2018, 11:24 AM

## 2018-06-16 NOTE — TOC Progression Note (Signed)
Transition of Care [facility]) - Progression Note    Patient Details  Name: MABREY HOWLAND MRN: [id number] Date of Birth: Jul 19, 1950  Transition of Care [facility] CM/SW Contact  Ihor Gully, LCSW Phone Number: 06/16/2018, 4:09 PM  Clinical Narrative:    Call received from [place], [phone], with [facility] CAP services. She stated that patient is in need of a hospital bed (patient sleeps on a mattress on the floor due to her height making it difficult for her to get in a standard height bed). The hospital bed is needed because the hoyer lift needs a frame to attach to.  She also requested that patient have an order for: a bariatric scale to monitor edema, a letter of need from the attending for a glucose monitor, a blood pressure cuff, traditional pump cuff because family takes patient's blood pressure in her lower arm due to her upper arm being larger.  Attending notified.  Will notify attending assigned tomorrow of needs.    Expected Discharge Plan: [place] Barriers to Discharge: Continued Medical Work up  Expected Discharge Plan and Services Expected Discharge Plan: [place] arrangements for the past 2 months: Single Family Home                                       Social Determinants of Health (SDOH) Interventions    Readmission Risk Interventions Readmission Risk Prevention Plan 06/15/2018 06/03/2018 05/11/2018  Transportation Screening Complete Complete -  PCP or Specialist Appt within 3-5 Days - - -  HRI or [place] - Complete -  Social Work Consult for [place] Planning/Counseling - Complete -  Palliative Care Screening - Not Applicable -  Medication Review Press photographer) - Complete -  PCP or Specialist appointment within 3-5 days of discharge - - Complete  HRI or [place] - - Complete  SW Recovery Care/Counseling Consult Complete - Complete  Palliative Care  Screening Not Applicable - Not [place] - - Not Applicable  Some recent data might be hidden

## 2018-06-17 DIAGNOSIS — E039 Hypothyroidism, unspecified: Secondary | ICD-10-CM

## 2018-06-17 DIAGNOSIS — K219 Gastro-esophageal reflux disease without esophagitis: Secondary | ICD-10-CM

## 2018-06-17 LAB — CULTURE, BLOOD (ROUTINE X 2)
Culture: NO GROWTH
Culture: NO GROWTH
Special Requests: ADEQUATE

## 2018-06-17 LAB — RENAL FUNCTION PANEL
Albumin: 2.5 g/dL — ABNORMAL LOW (ref 3.5–5.0)
Anion gap: 6 (ref 5–15)
BUN: 29 mg/dL — ABNORMAL HIGH (ref 8–23)
CO2: 25 mmol/L (ref 22–32)
Calcium: 8.7 mg/dL — ABNORMAL LOW (ref 8.9–10.3)
Chloride: 110 mmol/L (ref 98–111)
Creatinine, Ser: 2.1 mg/dL — ABNORMAL HIGH (ref 0.44–1.00)
GFR calc Af Amer: 28 mL/min — ABNORMAL LOW (ref >60)
GFR calc non Af Amer: 24 mL/min — ABNORMAL LOW (ref >60)
Glucose, Bld: 99 mg/dL (ref 70–99)
Phosphorus: 3.4 mg/dL (ref 2.5–4.6)
Potassium: 4.9 mmol/L (ref 3.5–5.1)
Sodium: 141 mmol/L (ref 135–145)

## 2018-06-17 LAB — GLUCOSE, CAPILLARY
Glucose-Capillary: 97 mg/dL (ref 70–99)
Glucose-Capillary: 116 mg/dL — ABNORMAL HIGH (ref 70–99)

## 2018-06-17 LAB — FOLATE RBC
Folate, Hemolysate: 311 ng/mL
Folate, RBC: 1819 ng/mL (ref >498)
Hematocrit: 17.1 % — CL (ref 34.0–46.6)

## 2018-06-17 MED ORDER — ALPRAZOLAM 1 MG PO TABS: 1.0000 mg | ORAL_TABLET | Freq: Two times a day (BID) | ORAL | 0 refills | Status: AC | PRN

## 2018-06-17 MED ORDER — ZOLPIDEM TARTRATE 5 MG PO TABS: 5.0000 mg | ORAL_TABLET | Freq: Every evening | ORAL | 1 refills | Status: DC | PRN

## 2018-06-17 MED ORDER — MIDODRINE HCL 10 MG PO TABS: 10.0000 mg | ORAL_TABLET | Freq: Three times a day (TID) | ORAL | 2 refills | Status: DC

## 2018-06-17 MED ORDER — ESCITALOPRAM OXALATE 20 MG PO TABS: 20.0000 mg | ORAL_TABLET | Freq: Every day | ORAL | 3 refills | Status: AC

## 2018-06-17 MED ORDER — TORSEMIDE 20 MG PO TABS
20.0000 mg | ORAL_TABLET | Freq: Every day | ORAL | Status: DC
  Administered 2018-06-17: 20 mg via ORAL
  Filled 2018-06-17: qty 1

## 2018-06-17 MED ORDER — POTASSIUM CHLORIDE CRYS ER 20 MEQ PO TBCR: 40.0000 meq | EXTENDED_RELEASE_TABLET | Freq: Every day | ORAL | 1 refills | Status: AC

## 2018-06-17 MED ORDER — LEVOFLOXACIN 500 MG PO TABS: 500.0000 mg | ORAL_TABLET | ORAL | 0 refills | Status: DC

## 2018-06-17 MED ORDER — FUROSEMIDE 10 MG/ML IJ SOLN
40.0000 mg | Freq: Once | INTRAMUSCULAR | Status: AC
  Administered 2018-06-17: 40 mg via INTRAVENOUS
  Filled 2018-06-17: qty 4

## 2018-06-17 MED ORDER — BLOOD GLUCOSE METER KIT: PACK | 3 refills | Status: DC

## 2018-06-17 MED ORDER — GLUCOSE BLOOD VI STRP: ORAL_STRIP | 12 refills | Status: DC

## 2018-06-17 NOTE — Care Management Important Message (Signed)
Important Message  Patient Details  Name: Rachel Morgan MRN: [id number] Date of Birth: [birth year]   Medicare Important Message Given:  Yes    Tommy Medal 06/17/2018, 11:11 AM

## 2018-06-17 NOTE — Discharge Instructions (Signed)
1)Very low-salt diet advised 2)Weigh yourself daily, call if you gain more than 3 pounds in 1 day or more than 5 pounds in 1 week as your diuretic medications may need to be adjusted 3)Limit your Fluid  intake to no more than 50 ounces (1.5 Liters) per day 4)Repeat BMP blood Test on Monday 06/22/2018.Marland Kitchen... 5) please note that there has been several medication changes

## 2018-06-17 NOTE — Progress Notes (Signed)
Patient ID: Rachel Morgan, female   DOB: 23-May-1950, 68 y.o.   MRN: [id number] S: No events overnight, however her sister is concerned about swelling in her left hand O:BP (!) 110/35   Pulse 69   Temp 98.1 F (36.7 C) (Oral)   Resp (!) 23   Ht 4\' 11"  (1.499 m)   Wt 123.8 kg   SpO2 100%   BMI 55.13 kg/m   Intake/Output Summary (Last 24 hours) at 06/17/2018 0915 Last data filed at 06/17/2018 0600 Gross per 24 hour  Intake 886.5 ml  Output 2200 ml  Net -1313.5 ml   Intake/Output: I/O last 3 completed shifts: In: 1949.1 [P.O.:340; I.V.:1509.1; IV Piggyback:100] Out: 2500 [Urine:2500]  Intake/Output this shift:  No intake/output data recorded. Weight change: 0.1 kg Gen: NAD CVS: no rub Resp: decreased BS Abd: +BS, soft, NT Ext: +edema of hands, trace edema of lower extremities  Recent Labs  Lab 06/12/18 1334 06/13/18 0437 06/14/18 0443 06/15/18 0448 06/16/18 0610 06/17/18 0421  NA 139 140 138 140 141 141  K 5.5* 4.6 5.0 4.6 4.7 4.9  CL 100 102 101 107 111 110  CO2 22 25 24 26 23 25   GLUCOSE 182* 105* 107* 101* 100* 99  BUN 48* 49* 46* 41* 36* 29*  CREATININE 3.18* 3.62* 3.83* 3.06* 2.55* 2.10*  ALBUMIN 3.3*  --   --  2.5* 2.7* 2.5*  CALCIUM 9.1 9.0 8.5* 8.6* 8.7* 8.7*  PHOS  --   --   --  4.6 4.0 3.4  AST 27  --   --   --   --   --   ALT 22  --   --   --   --   --    Liver Function Tests: Recent Labs  Lab 06/12/18 1334 06/15/18 0448 06/16/18 0610 06/17/18 0421  AST 27  --   --   --   ALT 22  --   --   --   ALKPHOS 80  --   --   --   BILITOT 0.6  --   --   --   PROT 7.0  --   --   --   ALBUMIN 3.3* 2.5* 2.7* 2.5*   No results for input(s): LIPASE, AMYLASE in the last 168 hours. No results for input(s): AMMONIA in the last 168 hours. CBC: Recent Labs  Lab 06/12/18 1334 06/13/18 0437 06/14/18 0443 06/15/18 0448 06/16/18 0610  WBC 11.0* 10.5 7.6 5.1 5.4  NEUTROABS 8.5*  --   --   --   --   HGB 10.9* 9.9* 9.1* 8.7* 9.1*  HCT 35.1* 32.1* 29.6*  29.1* 31.0*  MCV 96.7 97.6 96.7 98.6 100.3*  PLT 352 291 268 268 287   Cardiac Enzymes: Recent Labs  Lab 06/12/18 1334  TROPONINI <0.03   CBG: Recent Labs  Lab 06/16/18 0751 06/16/18 1127 06/16/18 1637 06/16/18 2126 06/17/18 0739  GLUCAP 102* 129* 109* 134* 97    Iron Studies:  Recent Labs    06/16/18 0610  IRON 59  TIBC 229*  FERRITIN 92   Studies/Results: No results found. Marland Kitchen aspirin EC  81 mg Oral Q breakfast  . Chlorhexidine Gluconate Cloth  6 each Topical Daily  . heparin  5,000 Units Subcutaneous Q8H  . insulin aspart  0-9 Units Subcutaneous TID WC  . levothyroxine  50 mcg Oral QAC breakfast  . mouth rinse  15 mL Mouth Rinse BID  . midodrine  10 mg Oral TID WC  .  oxybutynin  10 mg Oral q morning - 10a  . pantoprazole  40 mg Oral Daily    BMET    Component Value Date/Time   NA 141 06/17/2018 0421   K 4.9 06/17/2018 0421   CL 110 06/17/2018 0421   CO2 25 06/17/2018 0421   GLUCOSE 99 06/17/2018 0421   BUN 29 (H) 06/17/2018 0421   CREATININE 2.10 (H) 06/17/2018 0421   CALCIUM 8.7 (L) 06/17/2018 0421   GFRNONAA 24 (L) 06/17/2018 0421   GFRAA 28 (L) 06/17/2018 0421   CBC    Component Value Date/Time   WBC 5.4 06/16/2018 0610   RBC 3.09 (L) 06/16/2018 0610   HGB 9.1 (L) 06/16/2018 0610   HCT 31.0 (L) 06/16/2018 0610   PLT 287 06/16/2018 0610   MCV 100.3 (H) 06/16/2018 0610   MCH 29.4 06/16/2018 0610   MCHC 29.4 (L) 06/16/2018 0610   RDW 14.6 06/16/2018 0610   LYMPHSABS 1.5 06/12/2018 1334   MONOABS 0.9 06/12/2018 1334   EOSABS 0.0 06/12/2018 1334   BASOSABS 0.1 06/12/2018 1334     Assessment/Plan:  1. 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 UOP and Cr have improved. Pt's sister reports that she does have a kidney stone and is to have lithotripsy by Dr. Alyson Ingles of [facility] to "break it up". She has had multiple episodes of AKI/CKD related to decompensated CHF and cardiorenal syndrome.  Unclear baseline Cr but appears more recently to be 1.8-2.2.  1. Cr continues to improve with IVF's 2. Given her history of CHF and edema will stop ivf's 3. Cr at baseline and ok to restart home  4. No indication for dialysis at this time and will continue to follow UOP and Scr 2. Klebsiella urosepsis- presented with AMS. Improved and at baseline per her sister and previous hospital visits. On levofloxacin adjusted for renal function 3. Hypotension/sepsis- on midodrine. BP's better today 4. Hyperkalemia- resolved with IVF's 5. Obstructive uropathy- followed by Dr. Alyson Ingles of [facility] and s/p double-J stent and for repeat placement and stone extraction per her sister.  6. Chronic diastolic CHF- stable. Discussed with her sister the need for fluid restriction after discharge of 40-60 oz and sodium of 2000 mg/day 1. Stop IVF's 2. Ok to restart outpatient dose of torsemide 20 mg daily and follow uop and Cr levels 7. Anemia- due to acute illness as well as some chronic anemia due to CKD. Iron stores at goal. Transfuse prn. Hold off on ESA for now.  8. Disposition- once stable for discharge will need to follow up with me in 3-4 weeks after discharge.   Donetta Potts, MD [employer] [phone]

## 2018-06-17 NOTE — Discharge Summary (Signed)
Rachel Morgan, is a 68 y.o. female  DOB Mar 10, 1950  MRN [id number].  Admission date:  06/12/2018  Admitting Physician  [place], DO  Discharge Date:  06/17/2018   Primary MD  [facility] [place]  Recommendations for primary care physician for things to follow:   1)Very low-salt diet advised 2)Weigh yourself daily, call if you gain more than 3 pounds in 1 day or more than 5 pounds in 1 week as your diuretic medications may need to be adjusted 3)Limit your Fluid  intake to no more than 50 ounces (1.5 Liters) per day 4)Repeat BMP blood Test on Monday 06/22/2018.Marland Kitchen... 5) please note that there has been several medication changes   Admission Diagnosis  Confusion [R41.0] Acute cystitis with hematuria [N30.01]   Discharge Diagnosis  Confusion [R41.0] Acute cystitis with hematuria [N30.01]    Principal Problem:   Sepsis ([place]) Active Problems:   Hypothyroidism   Hypertension   GERD (gastroesophageal reflux disease)   Hyperkalemia   UTI (urinary tract infection)   Acute renal failure superimposed on stage 3 chronic kidney disease ([place])   Morbid obesity with BMI of 50.0-59.9, adult [facility])      Past Medical History:  Diagnosis Date  . Anxiety   . Calculus of gallbladder   . Cerebral palsy ([place])   . Chronic kidney disease   . Diabetes mellitus   . Gall stones   . GERD (gastroesophageal reflux disease)   . High cholesterol   . History of kidney stones   . Hypertension   . Hypothyroidism   . Kidney stones   . MR (mental retardation)   . Pyelonephritis   . Sepsis [facility])     Past Surgical History:  Procedure Laterality Date  . CYSTOSCOPY W/ URETERAL STENT PLACEMENT Bilateral 09/26/2015   Procedure: CYSTOSCOPY WITH Bilateral  RETROGRADE PYELOGRAM/URETERAL STENT PLACEMENT;  Surgeon: [name], MD;  Location: [facility];  Service: Urology;  Laterality: Bilateral;  .  CYSTOSCOPY W/ URETERAL STENT PLACEMENT Right 04/22/2018   Procedure: CYSTOSCOPY WITH RETROGRADE PYELOGRAM/URETERAL STENT PLACEMENT;  Surgeon: Cleon Gustin, MD;  Location: [facility];  Service: Urology;  Laterality: Right;  . CYSTOSCOPY W/ URETERAL STENT REMOVAL Left 11/10/2015   Procedure: CYSTOSCOPY WITH STENT REMOVAL;  Surgeon: Cleon Gustin, MD;  Location: [facility];  Service: Urology;  Laterality: Left;  . CYSTOSCOPY WITH STENT PLACEMENT Right 01/21/2016   Procedure: CYSTOSCOPY WITH STENT PLACEMENT;  Surgeon: Franchot Gallo, MD;  Location: [facility];  Service: Urology;  Laterality: Right;  . CYSTOSCOPY/RETROGRADE/URETEROSCOPY/STONE EXTRACTION WITH BASKET Left 10/31/2015   Procedure: CYSTOSCOPY/ BILATERAL URETEROSCOPY/BILATERAL STONE EXTRACTION/ LEFT STENT PLACEMENT;  Surgeon: Irine Seal, MD;  Location: [facility];  Service: Urology;  Laterality: Left;  . CYSTOSCOPY/URETEROSCOPY/HOLMIUM LASER/STENT PLACEMENT Right 02/27/2016   Procedure: CYSTOSCOPY/RIGHT URETEROSCOPY/HOLMIUM LASER/STENT PLACEMENT/STENT REMOVAL;  Surgeon: Irine Seal, MD;  Location: [facility];  Service: Urology;  Laterality: Right;  . HOLMIUM LASER APPLICATION N/A [id number]   Procedure: HOLMIUM LASER APPLICATION;  Surgeon: Irine Seal, MD;  Location: [facility];  Service: Urology;  Laterality: N/A;  . IR CV LINE INJECTION  05/06/2018  . IR FLUORO GUIDE PORT INSERTION RIGHT  04/10/2016  . IR US GUIDE VASC ACCESS RIGHT  04/10/2016     HPI  from the history and physical done on the day of admission:    Patient coming from: Home  Chief Complaint: Altered mental status  HPI: Rachel Morgan is a 68 y.o. female with medical history significant for morbid obesity, MR, hypertension, hypothyroidism, CKD stage III, chronic diastolic heart failure with EF 60%, GERD, nephrolithiasis with prior stent, and anxiety who was brought to the ED with some altered mental status which occurred earlier this morning.  This was accompanied with some epigastric  abdominal pain.  Her sister is a primary historian at the bedside and states that she was also noted to have some mild nausea.  She had an episode of emesis in the emergency department.  Of note, patient was recently discharged on 6/1 after treatment for acute on chronic diastolic heart failure.  She was previously noted to have nephrolithiasis and underwent ureteral stent placement as well.  No symptoms of dysuria or hematuria noted.   ED Course: Patient initially arrived with systolic blood pressures in the 60s and she received 2 L of fluid bolus with systolic now approximately 80.  She is noted to have a lactic acid level of 2.1.  She has been started on Levaquin with blood and urine cultures ordered.  Laboratory data with leukocytosis of 11,000 and hemoglobin 10.9.  Potassium 5.5 noted.  BUN 48 and creatinine 3.18 with baseline creatinine 1.8-2.  Blood glucose noted to be 182    Hospital Course:     Severe sepsis secondary toKlebsiellaUTI/pyelonephritis- urine cx with Klebsiella, overall much improved -Bloodcultures with no growth for last 4 days IV fluids discontinued -Midodrine added on 6/6 to help improve blood pressure readings-treated withIV Levaquin, will discharge on p.o. Levaquin adjusted for renal function  AKI on CKD stage III-IVwith oliguria-improving (baseline creatinine 1.8-2.2) -Creatinine peaked at 3.83 , improved with hydration down to 2.1 which is patient's baseline .avoid nephrotoxic agents  -Renal ultrasound without obstructive uropathy-Urine electrolytessuggestive of intrinsic renal process/ATN -Midodrineto assist with blood pressure readings Nephrology consultation appreciated,  Acute metabolic encephalopathy-SPECT due to Klebsiella UTI, resolved and at baseline and patient with MR  Nausea and vomiting-resolved -Zofran as needed -Maintain heart healthy diet  Chronic diastolic heart failure -Currently without any acute symptomatology -Monitor daily  weights with recent weight on discharge 6/1 117 kg, after IV fluids for AKI and urinary tract infection-Patient is currently up4kg in weight . -No significant dyspnea or increased oxygenation requirements noted, currently on 2 L which she wears chronically at home -Mild edema peripherally Per nephrologist IV fluids have been discontinued and torsemide has been restarted  GERD -PPI  Hypothyroidism -Synthroid  Hypertension -Currently with soft blood pressure readings; avoid antihypertensives except for torsemide -Started onmidodrine6/6  MR -Sister at bedside to assist with history  Morbid obesity --This complicates overall care   Code Status:Full code Family Communication:Sister, Rachel Morgan at bedside Disposition Plan: Home with home health services Consultants:  Nephrology  Procedures:  None  Antimicrobials:  Levaquin 6/5->   Discharge Condition: Much improved overall/stable at this time  Follow UP--PCP for repeat labs reevaluation  Diet and Activity recommendation:  As advised  Discharge Instructions    Discharge Instructions    Call MD for:  difficulty breathing, headache or visual disturbances   Complete by:  As  directed    Call MD for:  persistant dizziness or light-headedness   Complete by:  As directed    Call MD for:  persistant nausea and vomiting   Complete by:  As directed    Call MD for:  temperature >100.4   Complete by:  As directed    Diet - low sodium heart healthy   Complete by:  As directed    Discharge instructions   Complete by:  As directed    1)Very low-salt diet advised 2)Weigh yourself daily, call if you gain more than 3 pounds in 1 day or more than 5 pounds in 1 week as your diuretic medications may need to be adjusted 3)Limit your Fluid  intake to no more than 50 ounces (1.5 Liters) per day 4)Repeat BMP blood Test on Monday 06/22/2018.Marland Kitchen... 5) please note that there has been several medication changes   For home  use only DME Hospital bed   Complete by:  As directed    Patient with congestive heart failure, kidney failure and significant difficulties with mobility related ADLs with hypoxic respiratory failure requiring oxygen--  Dx...--- I50.33   Length of Need:  Lifetime   Patient has (list medical condition):  CHF/mobility related difficulties   The above medical condition requires:  Patient requires the ability to reposition frequently   Head must be elevated greater than:  30 degrees   Bed type:  Semi-electric   Hoyer Lift:  Yes   For home use only DME Other see comment   Complete by:  As directed    Patient needs bariatric scale for daily weights---   Patient with congestive heart failure, kidney failure and significant difficulties with mobility related ADLs with hypoxic respiratory failure requiring oxygen--  Dx-- I50.33   Length of Need:  Lifetime   For home use only DME Other see comment   Complete by:  As directed    Patient needs tradition cuff/ BP cuff-----  Patient with congestive heart failure, kidney failure and significant difficulties with mobility related ADLs with hypoxic respiratory failure requiring oxygen--  Dx... CHF I50.33   Length of Need:  Lifetime   Increase activity slowly   Complete by:  As directed         Discharge Medications     Allergies as of 06/17/2018      Reactions   Macrobid [nitrofurantoin Monohyd Macro] Hives, Swelling   Penicillins Swelling, Rash   Has patient had a PCN reaction causing immediate rash, facial/tongue/throat swelling, SOB or lightheadedness with hypotension: Yes Has patient had a PCN reaction causing severe rash involving mucus membranes or skin necrosis: Yes Has patient had a PCN reaction that required hospitalization: no Has patient had a PCN reaction occurring within the last 10 years: no If all of the above answers are "NO", then may proceed with Cephalosporin use. Patient tolerated rocephin and ertapenem in the past       Medication List    TAKE these medications   acetaminophen 325 MG tablet Commonly known as:  TYLENOL Take 2 tablets (650 mg total) by mouth every 6 (six) hours as needed for mild pain (or Fever >/= 101).   ALPRAZolam 1 MG tablet Commonly known as:  XANAX Take 1 tablet (1 mg total) by mouth 2 (two) times daily as needed for anxiety. What changed:    when to take this  reasons to take this   aspirin EC 81 MG tablet Take 1 tablet (81 mg total) by mouth daily with  breakfast.   blood glucose meter kit and supplies Relion Prime or Dispense other brand based on patient and insurance preference. Use up to four times daily as directed. (FOR ICD-9 250.00, 250.01).  DX--R 73.9   busPIRone 10 MG tablet Commonly known as:  BUSPAR Take 10 mg by mouth 2 (two) times daily.   escitalopram 20 MG tablet Commonly known as:  LEXAPRO Take 1 tablet (20 mg total) by mouth daily. What changed:  when to take this   glucose blood test strip Use as instructed--- Dx R73.9   levofloxacin 500 MG tablet Commonly known as:  Levaquin Take 1 tablet (500 mg total) by mouth See admin instructions for 7 days. Give 1 tablet on Thursday, 06/18/2018, also give 1 tablet on Saturday, 06/20/2018 and on Monday, 06/22/2018   levothyroxine 50 MCG tablet Commonly known as:  SYNTHROID Take 50 mcg by mouth daily before breakfast.   midodrine 10 MG tablet Commonly known as:  PROAMATINE Take 1 tablet (10 mg total) by mouth 3 (three) times daily with meals.   OLANZapine 20 MG tablet Commonly known as:  ZYPREXA Take 20 mg by mouth at bedtime.   omeprazole 20 MG capsule Commonly known as:  PRILOSEC Take 20 mg by mouth daily before breakfast.   oxybutynin 10 MG 24 hr tablet Commonly known as:  DITROPAN-XL Take 10 mg by mouth every morning.   potassium chloride SA 20 MEQ tablet Commonly known as:  K-DUR Take 2 tablets (40 mEq total) by mouth daily.   senna-docusate 8.6-50 MG tablet Commonly known as:   Senokot-S Take 1 tablet by mouth 2 (two) times daily. What changed:    when to take this  reasons to take this   sodium bicarbonate 650 MG tablet Take 1 tablet (650 mg total) by mouth daily.   torsemide 20 MG tablet Commonly known as:  Demadex Take 1 tablet (20 mg total) by mouth 2 (two) times daily. For Fluid   zolpidem 5 MG tablet Commonly known as:  AMBIEN Take 1 tablet (5 mg total) by mouth at bedtime as needed for sleep.            Durable Medical Equipment  (From admission, onward)         Start     Ordered   06/17/18 1317  DME Glucometer  Once    Comments:  Use as directed-- Dx---R73.9   06/17/18 1321   06/17/18 0000  For home use only DME Hospital bed    Comments:  Patient with congestive heart failure, kidney failure and significant difficulties with mobility related ADLs with hypoxic respiratory failure requiring oxygen--  Dx...--- I50.33  Question Answer Comment  Length of Need Lifetime   Patient has (list medical condition): CHF/mobility related difficulties   The above medical condition requires: Patient requires the ability to reposition frequently   Head must be elevated greater than: 30 degrees   Bed type Semi-electric   Hoyer Lift Yes      06/17/18 1332   06/17/18 0000  For home use only DME Other see comment    Comments:  Patient needs bariatric scale for daily weights---   Patient with congestive heart failure, kidney failure and significant difficulties with mobility related ADLs with hypoxic respiratory failure requiring oxygen--  Dx-- I50.33  Question:  Length of Need  Answer:  Lifetime   06/17/18 1332   06/17/18 0000  For home use only DME Other see comment    Comments:  Patient needs tradition cuff/  BP cuff-----  Patient with congestive heart failure, kidney failure and significant difficulties with mobility related ADLs with hypoxic respiratory failure requiring oxygen--  Dx... CHF I50.33  Question:  Length of Need  Answer:   Lifetime   06/17/18 1332          Major procedures and Radiology Reports - PLEASE review detailed and final reports for all details, in brief -   Dg Chest 2 View  Result Date: 06/05/2018 CLINICAL DATA:  Shortness of breath. EXAM: CHEST - 2 VIEW COMPARISON:  Chest x-ray dated Jun 01, 2018. FINDINGS: Unchanged right chest wall port catheter. Stable cardiomegaly with pulmonary vascular congestion and mild interstitial edema. Increasing hazy density of the left lung base related to new small pleural effusion and associated left basilar atelectasis. No pneumothorax. No acute osseous abnormality. IMPRESSION: 1. New small left pleural effusion. Unchanged mild interstitial pulmonary edema. Electronically Signed   By: Titus Dubin M.D.   On: 06/05/2018 09:06   Dg Ankle Complete Right  Result Date: 06/01/2018 CLINICAL DATA:  Pain  and swelling EXAM: RIGHT ANKLE - COMPLETE 3+ VIEW COMPARISON:  Right foot 01/17/2016 FINDINGS: Negative for acute fracture. Ankle joint space normal. No effusion. Small ossicle at the tip of the distal fibula may be due to old injury. Diffuse soft tissue swelling. IMPRESSION: Diffuse soft tissue swelling.  Negative for acute fracture. Electronically Signed   By: Franchot Gallo M.D.   On: 06/01/2018 19:16   US Renal  Result Date: 06/13/2018 CLINICAL DATA:  Acute renal failure.  UTI. EXAM: RENAL / URINARY TRACT ULTRASOUND COMPLETE COMPARISON:  CT 04/22/2018 FINDINGS: Exam difficult due to altered mental status and by habitus. Right Kidney: Renal measurements: 8.8 x 5.0 x 5.0 cm = volume: 116 mL . Echogenicity within normal limits. No mass or hydronephrosis visualized. Left Kidney: Renal measurements: 8.6 x 4.0 x 4.6 cm = volume: 82 mL. Echogenicity within normal limits. No mass or hydronephrosis visualized. Bladder: Not visualized. IMPRESSION: Kidneys at the lower limits of normal size with left smaller than right. No hydronephrosis or focal mass. Electronically Signed   By:  Marin Olp M.D.   On: 06/13/2018 13:02   Dg Chest Portable 1 View  Addendum Date: 06/12/2018   ADDENDUM REPORT: 06/12/2018 17:24 ADDENDUM: An additional image was added to this study but it does not add any new information to the exam. Electronically Signed   By: Marijo Sanes M.D.   On: 06/12/2018 17:24   Result Date: 06/12/2018 CLINICAL DATA:  Two episodes of loss of consciousness. EXAM: PORTABLE CHEST 1 VIEW COMPARISON:  06/07/2018 FINDINGS: The right IJ power port is stable. The heart is enlarged but unchanged. The mediastinal and hilar contours are prominent but unchanged. The lungs demonstrate slight improved aeration with resolving edema and atelectasis. No obvious pleural effusions. The bony thorax is intact. IMPRESSION: Slight improved lung aeration with resolving edema and atelectasis. No definite pleural effusions. Electronically Signed: By: Marijo Sanes M.D. On: 06/12/2018 13:06   Dg Chest Port 1 View  Result Date: 06/07/2018 CLINICAL DATA:  Respiratory failure, hypoxia EXAM: PORTABLE CHEST 1 VIEW COMPARISON:  06/05/2018 chest radiograph. FINDINGS: Right internal jugular Port-A-Cath terminates at the cavoatrial junction. Stable cardiomediastinal silhouette with mild cardiomegaly. No pneumothorax. No pleural effusion. Right rotated chest radiograph. Mild-to-moderate pulmonary edema appears slightly worsened. IMPRESSION: Mild-to-moderate congestive heart failure, slightly worsened. Electronically Signed   By: Ilona Sorrel M.D.   On: 06/07/2018 10:06   Dg Chest Port 1 View  Result Date:  06/01/2018 CLINICAL DATA:  Shortness of breath. EXAM: PORTABLE CHEST 1 VIEW COMPARISON:  05/13/2018 FINDINGS: 1802 hours. The cardio pericardial silhouette is enlarged. Vascular congestion is associated with pulmonary edema pattern bilaterally. No substantial pleural effusions. The visualized bony structures of the thorax are intact. Right Port-A-Cath again noted. Telemetry leads overlie the chest.  IMPRESSION: Cardiomegaly with vascular congestion and pulmonary edema pattern. Electronically Signed   By: Misty Stanley M.D.   On: 06/01/2018 18:27    Micro Results   Recent Results (from the past 240 hour(s))  Urine Culture     Status: Abnormal   Collection Time: 06/12/18 12:47 PM  Result Value Ref Range Status   Specimen Description   Final    URINE, CLEAN CATCH Performed at [facility], [address] [place], [place] [address]    Special Requests   Final    NONE Performed at [facility], [address], [place], [place] [address]    Culture >=100,000 COLONIES/mL KLEBSIELLA PNEUMONIAE (A)  Final   Report Status 06/15/2018 FINAL  Final   Organism ID, Bacteria KLEBSIELLA PNEUMONIAE (A)  Final      Susceptibility   Klebsiella pneumoniae - MIC*    AMPICILLIN >=32 RESISTANT Resistant     CEFAZOLIN <=4 SENSITIVE Sensitive     CEFTRIAXONE <=1 SENSITIVE Sensitive     CIPROFLOXACIN <=0.25 SENSITIVE Sensitive     GENTAMICIN <=1 SENSITIVE Sensitive     IMIPENEM <=0.25 SENSITIVE Sensitive     NITROFURANTOIN 128 RESISTANT Resistant     TRIMETH/SULFA <=20 SENSITIVE Sensitive     AMPICILLIN/SULBACTAM 8 SENSITIVE Sensitive     PIP/TAZO 8 SENSITIVE Sensitive     Extended ESBL NEGATIVE Sensitive     * >=100,000 COLONIES/mL KLEBSIELLA PNEUMONIAE  SARS Coronavirus 2 (CEPHEID- Performed in [place] hospital lab), Hosp Order     Status: None   Collection Time: 06/12/18 12:51 PM  Result Value Ref Range Status   SARS Coronavirus 2 NEGATIVE NEGATIVE Final    Comment: (NOTE) If result is NEGATIVE SARS-CoV-2 target nucleic acids are NOT DETECTED. The SARS-CoV-2 RNA is generally detectable in upper and lower  respiratory specimens during the acute phase of infection. The lowest  concentration of SARS-CoV-2 viral copies this assay can detect is 250  copies / mL. A negative result does not preclude SARS-CoV-2 infection  and should not be used as the sole basis for treatment or other  patient  management decisions.  A negative result may occur with  improper specimen collection / handling, submission of specimen other  than nasopharyngeal swab, presence of viral mutation(s) within the  areas targeted by this assay, and inadequate number of viral copies  (<250 copies / mL). A negative result must be combined with clinical  observations, patient history, and epidemiological information. If result is POSITIVE SARS-CoV-2 target nucleic acids are DETECTED. The SARS-CoV-2 RNA is generally detectable in upper and lower  respiratory specimens dur ing the acute phase of infection.  Positive  results are indicative of active infection with SARS-CoV-2.  Clinical  correlation with patient history and other diagnostic information is  necessary to determine patient infection status.  Positive results do  not rule out bacterial infection or co-infection with other viruses. If result is PRESUMPTIVE POSTIVE SARS-CoV-2 nucleic acids MAY BE PRESENT.   A presumptive positive result was obtained on the submitted specimen  and confirmed on repeat testing.  While 2019 novel coronavirus  (SARS-CoV-2) nucleic acids may be present in the submitted sample  additional  confirmatory testing may be necessary for epidemiological  and / or clinical management purposes  to differentiate between  SARS-CoV-2 and other Sarbecovirus currently known to infect humans.  If clinically indicated additional testing with an alternate test  methodology [phone]) is advised. The SARS-CoV-2 RNA is generally  detectable in upper and lower respiratory sp ecimens during the acute  phase of infection. The expected result is Negative. Fact Sheet for Patients:  StrictlyIdeas.no Fact Sheet for Healthcare Providers: BankingDealers.co.za This test is not yet approved or cleared by the [place] FDA and has been authorized for detection and/or diagnosis of SARS-CoV-2 by FDA under  an Emergency Use Authorization (EUA).  This EUA will remain in effect (meaning this test can be used) for the duration of the COVID-19 declaration under Section 564(b)(1) of the Act, 21 U.S.C. section 360bbb-3(b)(1), unless the authorization is terminated or revoked sooner. Performed at [facility], [address] [place], [place] [address]   Blood Culture (routine x 2)     Status: None   Collection Time: 06/12/18  1:24 PM  Result Value Ref Range Status   Specimen Description BLOOD LEFT ARM  Final   Special Requests   Final    BOTTLES DRAWN AEROBIC AND ANAEROBIC Blood Culture adequate volume   Culture   Final    NO GROWTH 5 DAYS Performed at [facility], [address], [place], [place] [address]    Report Status 06/17/2018 FINAL  Final  Blood Culture (routine x 2)     Status: None   Collection Time: 06/12/18  1:35 PM  Result Value Ref Range Status   Specimen Description BLOOD LEFT ARM  Final   Special Requests   Final    BOTTLES DRAWN AEROBIC AND ANAEROBIC Blood Culture results may not be optimal due to an inadequate volume of blood received in culture bottles   Culture   Final    NO GROWTH 5 DAYS Performed at [facility], [address], [place], [place] [address]    Report Status 06/17/2018 FINAL  Final  MRSA PCR Screening     Status: None   Collection Time: 06/12/18  7:47 PM  Result Value Ref Range Status   MRSA by PCR NEGATIVE NEGATIVE Final    Comment:        The GeneXpert MRSA Assay (FDA approved for NASAL specimens only), is one component of a comprehensive MRSA colonization surveillance program. It is not intended to diagnose MRSA infection nor to guide or monitor treatment for MRSA infections. Performed at [facility], [address], [place], [place] [address]        Today   Subjective    Rachel Morgan today has no new complaints, eating and drinking well, patient sister at bedside, questions answered          Patient has been seen and examined  prior to discharge   Objective   Blood pressure (!) 110/54, pulse 66, temperature 100.2 F (37.9 C), temperature source Oral, resp. rate 15, height _0  (1.499 m), weight 123.8 kg, SpO2 96 %.   Intake/Output Summary (Last 24 hours) at 06/17/2018 1334 Last data filed at 06/17/2018 1034 Gross per 24 hour  Intake 649.59 ml  Output 1650 ml  Net -1000.41 ml    Exam Gen:- Awake Alert, Obese, no acute distress at rest HEENT:- [place].AT, No sclera icterus Nose-  2L/min Neck-Supple Neck,   Lungs-improving air movement bilaterally, no wheezing  CV- S1, S2 normal, regular , Rt Subclavian Portha-Cath in situ  Abd- +ve B.Sounds, Abd Soft, No tenderness, increased truncal adiposity  Extremity/Skin:-Trace edema, pedal pulses present, Psych-significant cognitive and verbal deficits due to underlying cerebral palsy  Neuro-generalized weakness, no new focal deficits, no tremors   Data Review   CBC w Diff:  Lab Results  Component Value Date   WBC 5.4 06/16/2018   HGB 9.1 (L) 06/16/2018   HCT 31.0 (L) 06/16/2018   PLT 287 06/16/2018   LYMPHOPCT 13 06/12/2018   MONOPCT 8 06/12/2018   EOSPCT 0 06/12/2018   BASOPCT 1 06/12/2018    CMP:  Lab Results  Component Value Date   NA 141 06/17/2018   K 4.9 06/17/2018   CL 110 06/17/2018   CO2 25 06/17/2018   BUN 29 (H) 06/17/2018   CREATININE 2.10 (H) 06/17/2018   PROT 7.0 06/12/2018   ALBUMIN 2.5 (L) 06/17/2018   BILITOT 0.6 06/12/2018   ALKPHOS 80 06/12/2018   AST 27 06/12/2018   ALT 22 06/12/2018  .   Total Discharge time is about 33 minutes  Roxan Hockey M.D on 06/17/2018 at 1:34 PM  Go to [email or web address] -  for contact info  [facility] - Office  [phone]

## 2018-06-17 NOTE — Progress Notes (Signed)
Patient received discharge instructions. Legal guardian, sister [name], notified of patient's discharge and aware that the discharge paperwork and printed prescriptions are with patient. Patient's chest port was deaccessed by this RN prior to discharge. [facility] transported patient to home.

## 2018-06-18 NOTE — Clinical Social Work Note (Signed)
D/c summary and HH orders faxed to Madaline Guthrie at [place] to [phone].    Susana Duell, Clydene Pugh, LCSW

## 2018-06-22 ENCOUNTER — Encounter (HOSPITAL_COMMUNITY)
Admit: 2018-06-22 | Discharge: 2018-06-22 | Disposition: A | Discharge status: Home / Self Care | Payer: Medicare Other | Attending: Urology | Admitting: Urology

## 2018-06-24 ENCOUNTER — Encounter (HOSPITAL_COMMUNITY)
Admission: RE | Disposition: A | Discharge status: Home / Self Care | Payer: Self-pay | Source: Home / Self Care | Attending: Urology

## 2018-06-24 ENCOUNTER — Ambulatory Visit (HOSPITAL_COMMUNITY)
Admission: RE | Admit: 2018-06-24 | Discharge: 2018-06-24 | Disposition: A | Discharge status: Home / Self Care | Payer: Medicare Other | Attending: Urology | Admitting: Urology

## 2018-06-24 ENCOUNTER — Encounter (HOSPITAL_COMMUNITY): Payer: Self-pay | Admitting: *Deleted

## 2018-06-24 ENCOUNTER — Ambulatory Visit (HOSPITAL_COMMUNITY): Payer: Medicare Other | Admitting: Anesthesiology

## 2018-06-24 ENCOUNTER — Ambulatory Visit (HOSPITAL_COMMUNITY): Payer: Medicare Other

## 2018-06-24 DIAGNOSIS — Z87442 Personal history of urinary calculi: Secondary | ICD-10-CM | POA: Insufficient documentation

## 2018-06-24 DIAGNOSIS — F79 Unspecified intellectual disabilities: Secondary | ICD-10-CM | POA: Insufficient documentation

## 2018-06-24 DIAGNOSIS — I129 Hypertensive chronic kidney disease with stage 1 through stage 4 chronic kidney disease, or unspecified chronic kidney disease: Secondary | ICD-10-CM | POA: Diagnosis not present

## 2018-06-24 DIAGNOSIS — G809 Cerebral palsy, unspecified: Secondary | ICD-10-CM | POA: Diagnosis not present

## 2018-06-24 DIAGNOSIS — Z6841 Body Mass Index (BMI) 40.0 and over, adult: Secondary | ICD-10-CM | POA: Insufficient documentation

## 2018-06-24 DIAGNOSIS — N189 Chronic kidney disease, unspecified: Secondary | ICD-10-CM | POA: Insufficient documentation

## 2018-06-24 DIAGNOSIS — N201 Calculus of ureter: Secondary | ICD-10-CM | POA: Diagnosis not present

## 2018-06-24 DIAGNOSIS — E1122 Type 2 diabetes mellitus with diabetic chronic kidney disease: Secondary | ICD-10-CM | POA: Diagnosis not present

## 2018-06-24 DIAGNOSIS — K219 Gastro-esophageal reflux disease without esophagitis: Secondary | ICD-10-CM | POA: Insufficient documentation

## 2018-06-24 DIAGNOSIS — N200 Calculus of kidney: Secondary | ICD-10-CM | POA: Diagnosis not present

## 2018-06-24 HISTORY — PX: HOLMIUM LASER APPLICATION: SHX5852

## 2018-06-24 HISTORY — PX: CYSTOSCOPY WITH RETROGRADE PYELOGRAM, URETEROSCOPY AND STENT PLACEMENT: SHX5789

## 2018-06-24 SURGERY — CYSTOURETEROSCOPY, WITH RETROGRADE PYELOGRAM AND STENT INSERTION
Anesthesia: General | Site: Ureter | Laterality: Right

## 2018-06-24 MED ORDER — TRAMADOL HCL 50 MG PO TABS: 50.0000 mg | ORAL_TABLET | Freq: Four times a day (QID) | ORAL | 0 refills | Status: DC | PRN

## 2018-06-24 MED ORDER — SUCCINYLCHOLINE CHLORIDE 200 MG/10ML IV SOSY
PREFILLED_SYRINGE | INTRAVENOUS | Status: AC
  Filled 2018-06-24: qty 10

## 2018-06-24 MED ORDER — ROCURONIUM BROMIDE 10 MG/ML (PF) SYRINGE
PREFILLED_SYRINGE | INTRAVENOUS | Status: DC | PRN
  Administered 2018-06-24: 30 mg via INTRAVENOUS

## 2018-06-24 MED ORDER — DIATRIZOATE MEGLUMINE 30 % UR SOLN
URETHRAL | Status: AC
  Filled 2018-06-24: qty 100

## 2018-06-24 MED ORDER — PHENYLEPHRINE 40 MCG/ML (10ML) SYRINGE FOR IV PUSH (FOR BLOOD PRESSURE SUPPORT)
PREFILLED_SYRINGE | INTRAVENOUS | Status: AC
  Filled 2018-06-24: qty 10

## 2018-06-24 MED ORDER — PROMETHAZINE HCL 25 MG/ML IJ SOLN: 6.2500 mg | INTRAMUSCULAR | Status: DC | PRN

## 2018-06-24 MED ORDER — HEPARIN SOD (PORK) LOCK FLUSH 100 UNIT/ML IV SOLN
500.0000 [IU] | INTRAVENOUS | Status: DC | PRN
  Filled 2018-06-24: qty 5

## 2018-06-24 MED ORDER — WATER FOR IRRIGATION, STERILE IR SOLN
Status: DC | PRN
  Administered 2018-06-24: 500 mL

## 2018-06-24 MED ORDER — SODIUM CHLORIDE 0.9 % IV SOLN
INTRAVENOUS | Status: AC
  Filled 2018-06-24: qty 20

## 2018-06-24 MED ORDER — SODIUM CHLORIDE 0.9% FLUSH: 10.0000 mL | INTRAVENOUS | Status: DC | PRN

## 2018-06-24 MED ORDER — SUCCINYLCHOLINE CHLORIDE 200 MG/10ML IV SOSY
PREFILLED_SYRINGE | INTRAVENOUS | Status: DC | PRN
  Administered 2018-06-24: 180 mg via INTRAVENOUS

## 2018-06-24 MED ORDER — SODIUM CHLORIDE 0.9 % IV SOLN
INTRAVENOUS | Status: AC
  Filled 2018-06-24: qty 10

## 2018-06-24 MED ORDER — FENTANYL CITRATE (PF) 100 MCG/2ML IJ SOLN
INTRAMUSCULAR | Status: AC
  Filled 2018-06-24: qty 2

## 2018-06-24 MED ORDER — HYDROCODONE-ACETAMINOPHEN 7.5-325 MG PO TABS: 1.0000 | ORAL_TABLET | Freq: Once | ORAL | Status: DC | PRN

## 2018-06-24 MED ORDER — PROPOFOL 10 MG/ML IV BOLUS
INTRAVENOUS | Status: DC | PRN
  Administered 2018-06-24: 150 mg via INTRAVENOUS

## 2018-06-24 MED ORDER — SODIUM CHLORIDE 0.9 % IV SOLN: 2.0000 g | INTRAVENOUS | Status: DC

## 2018-06-24 MED ORDER — ROCURONIUM BROMIDE 10 MG/ML (PF) SYRINGE
PREFILLED_SYRINGE | INTRAVENOUS | Status: AC
  Filled 2018-06-24: qty 10

## 2018-06-24 MED ORDER — SUGAMMADEX SODIUM 200 MG/2ML IV SOLN
INTRAVENOUS | Status: DC | PRN
  Administered 2018-06-24: 200 mg via INTRAVENOUS

## 2018-06-24 MED ORDER — SODIUM CHLORIDE 0.9 % IR SOLN
Status: DC | PRN
  Administered 2018-06-24 (×2): 3000 mL

## 2018-06-24 MED ORDER — ONDANSETRON HCL 4 MG/2ML IJ SOLN
INTRAMUSCULAR | Status: DC | PRN
  Administered 2018-06-24: 4 mg via INTRAVENOUS

## 2018-06-24 MED ORDER — FENTANYL CITRATE (PF) 100 MCG/2ML IJ SOLN
INTRAMUSCULAR | Status: DC | PRN
  Administered 2018-06-24 (×4): 25 ug via INTRAVENOUS

## 2018-06-24 MED ORDER — LACTATED RINGERS IV SOLN
INTRAVENOUS | Status: DC
  Administered 2018-06-24: 12:00:00 1000 mL via INTRAVENOUS

## 2018-06-24 MED ORDER — DIATRIZOATE MEGLUMINE 30 % UR SOLN
URETHRAL | Status: DC | PRN
  Administered 2018-06-24: 14:00:00 10 mL via URETHRAL

## 2018-06-24 MED ORDER — HYDROMORPHONE HCL 1 MG/ML IJ SOLN: 0.2500 mg | INTRAMUSCULAR | Status: DC | PRN

## 2018-06-24 MED ORDER — LIDOCAINE 2% (20 MG/ML) 5 ML SYRINGE
INTRAMUSCULAR | Status: AC
  Filled 2018-06-24: qty 5

## 2018-06-24 MED ORDER — MIDAZOLAM HCL 2 MG/2ML IJ SOLN: 0.5000 mg | Freq: Once | INTRAMUSCULAR | Status: DC | PRN

## 2018-06-24 SURGICAL SUPPLY — 26 items
BAG DRAIN URO TABLE W/ADPT NS (BAG) ×3 IMPLANT
CATH INTERMIT  6FR 70CM (CATHETERS) ×3 IMPLANT
CLOTH BEACON ORANGE TIMEOUT ST (SAFETY) ×3 IMPLANT
DECANTER SPIKE VIAL GLASS SM (MISCELLANEOUS) ×3 IMPLANT
EXTRACTOR STONE NITINOL NGAGE (UROLOGICAL SUPPLIES) ×3 IMPLANT
FIBER LASER FLEXIVA 200 (UROLOGICAL SUPPLIES) ×3 IMPLANT
GLOVE BIO SURGEON STRL SZ7 (GLOVE) ×3 IMPLANT
GLOVE BIO SURGEON STRL SZ8 (GLOVE) ×3 IMPLANT
GLOVE BIOGEL PI IND STRL 7.0 (GLOVE) ×2 IMPLANT
GLOVE BIOGEL PI INDICATOR 7.0 (GLOVE) ×4
GOWN STRL REUS W/ TWL XL LVL3 (GOWN DISPOSABLE) ×1 IMPLANT
GOWN STRL REUS W/TWL LRG LVL3 (GOWN DISPOSABLE) ×3 IMPLANT
GOWN STRL REUS W/TWL XL LVL3 (GOWN DISPOSABLE) ×2
GUIDEWIRE STR DUAL SENSOR (WIRE) ×3 IMPLANT
GUIDEWIRE STR ZIPWIRE 035X150 (MISCELLANEOUS) ×3 IMPLANT
IV NS IRRIG 3000ML ARTHROMATIC (IV SOLUTION) ×6 IMPLANT
KIT TURNOVER CYSTO (KITS) ×3 IMPLANT
MANIFOLD NEPTUNE II (INSTRUMENTS) ×3 IMPLANT
PACK CYSTO (CUSTOM PROCEDURE TRAY) ×3 IMPLANT
PAD ARMBOARD 7.5X6 YLW CONV (MISCELLANEOUS) ×3 IMPLANT
SHEATH URETERAL 12FRX35CM (MISCELLANEOUS) ×3 IMPLANT
STENT URET 6FRX24 CONTOUR (STENTS) ×3 IMPLANT
STENT URET 6FRX26 CONTOUR (STENTS) IMPLANT
SYR 10ML LL (SYRINGE) ×3 IMPLANT
TOWEL OR 17X26 4PK STRL BLUE (TOWEL DISPOSABLE) ×3 IMPLANT
WATER STERILE IRR 500ML POUR (IV SOLUTION) ×3 IMPLANT

## 2018-06-24 NOTE — Discharge Instructions (Signed)
Ureteral Stent Implantation, Care After Refer to this sheet in the next few weeks. These instructions provide you with information about caring for yourself after your procedure. Your health care provider may also give you more specific instructions. Your treatment has been planned according to current medical practices, but problems sometimes occur. Call your health care provider if you have any problems or questions after your procedure. What can I expect after the procedure? After the procedure, it is common to have:  Nausea.  Mild pain when you urinate. You may feel this pain in your lower back or lower abdomen. Pain should stop within a few minutes after you urinate. This may last for up to 1 week.  A small amount of blood in your urine for several days. Follow these instructions at home:  Medicines  Take over-the-counter and prescription medicines only as told by your health care provider.  If you were prescribed an antibiotic medicine, take it as told by your health care provider. Do not stop taking the antibiotic even if you start to feel better.  Do not drive for 24 hours if you received a sedative.  Do not drive or operate heavy machinery while taking prescription pain medicines. Activity  Return to your normal activities as told by your health care provider. Ask your health care provider what activities are safe for you.  Do not lift anything that is heavier than 10 lb (4.5 kg). Follow this limit for 1 week after your procedure, or for as long as told by your health care provider. General instructions  Watch for any blood in your urine. Call your health care provider if the amount of blood in your urine increases.  If you have a catheter: ? Follow instructions from your health care provider about taking care of your catheter and collection bag. ? Do not take baths, swim, or use a hot tub until your health care provider approves.  Drink enough fluid to keep your urine  clear or pale yellow.  Keep all follow-up visits as told by your health care provider. This is important. Contact a health care provider if:  You have pain that gets worse or does not get better with medicine, especially pain when you urinate.  You have difficulty urinating.  You feel nauseous or you vomit repeatedly during a period of more than 2 days after the procedure. Get help right away if:  Your urine is dark red or has blood clots in it.  You are leaking urine (have incontinence).  The end of the stent comes out of your urethra.  You cannot urinate.  You have sudden, sharp, or severe pain in your abdomen or lower back.  You have a fever. This information is not intended to replace advice given to you by your health care provider. Make sure you discuss any questions you have with your health care provider. Document Released: 08/26/2012 Document Revised: 06/01/2015 Document Reviewed: 07/08/2014 Elsevier Interactive Patient Education  2019 [place] STENT IN 72 HOURS BY GENTLY PULLING THE STRING  Monitored Anesthesia Care, Care After These instructions provide you with information about caring for yourself after your procedure. Your health care provider may also give you more specific instructions. Your treatment has been planned according to current medical practices, but problems sometimes occur. Call your health care provider if you have any problems or questions after your procedure. What can I expect after the procedure? After your procedure, you may:  Feel sleepy for several  hours.  Feel clumsy and have poor balance for several hours.  Feel forgetful about what happened after the procedure.  Have poor judgment for several hours.  Feel nauseous or vomit.  Have a sore throat if you had a breathing tube during the procedure. Follow these instructions at home: For at least 24 hours after the procedure:      Have a responsible adult  stay with you. It is important to have someone help care for you until you are awake and alert.  Rest as needed.  Do not: ? Participate in activities in which you could fall or become injured. ? Drive. ? Use heavy machinery. ? Drink alcohol. ? Take sleeping pills or medicines that cause drowsiness. ? Make important decisions or sign legal documents. ? Take care of children on your own. Eating and drinking  Follow the diet that is recommended by your health care provider.  If you vomit, drink water, juice, or soup when you can drink without vomiting.  Make sure you have little or no nausea before eating solid foods. General instructions  Take over-the-counter and prescription medicines only as told by your health care provider.  If you have sleep apnea, surgery and certain medicines can increase your risk for breathing problems. Follow instructions from your health care provider about wearing your sleep device: ? Anytime you are sleeping, including during daytime naps. ? While taking prescription pain medicines, sleeping medicines, or medicines that make you drowsy.  If you smoke, do not smoke without supervision.  Keep all follow-up visits as told by your health care provider. This is important. Contact a health care provider if:  You keep feeling nauseous or you keep vomiting.  You feel light-headed.  You develop a rash.  You have a fever. Get help right away if:  You have trouble breathing. Summary  For several hours after your procedure, you may feel sleepy and have poor judgment.  Have a responsible adult stay with you for at least 24 hours or until you are awake and alert. This information is not intended to replace advice given to you by your health care provider. Make sure you discuss any questions you have with your health care provider. Document Released: 04/16/2015 Document Revised: 08/09/2016 Document Reviewed: 04/16/2015 Elsevier Interactive Patient  Education  2019 [employer].

## 2018-06-24 NOTE — Anesthesia Preprocedure Evaluation (Signed)
Anesthesia Evaluation    Airway Mallampati: II  TM Distance: >3 FB Neck ROM: Full    Dental no notable dental hx. (+) Edentulous Upper, Edentulous Lower   Pulmonary pneumonia, resolved,  Uses home 02 as needed PRN Sister states now able to ambulate  Denies Inhaler use  or smoking history     Pulmonary exam normal breath sounds clear to auscultation       Cardiovascular Exercise Tolerance: Good hypertension, Pt. on medications +CHF  Normal cardiovascular examI Rhythm:Regular Rate:Normal  Per sister CHF resolved   Neuro/Psych Anxiety Depression    GI/Hepatic GERD  Medicated and Controlled,  Endo/Other  diabetesHypothyroidism Morbid obesitySMO -BMI>50  Renal/GU Renal InsufficiencyRenal diseaseLarge stone -here for cysto /stent / holmium laser      Musculoskeletal   Abdominal   Peds  Hematology   Anesthesia Other Findings   Reproductive/Obstetrics                             Anesthesia Physical Anesthesia Plan  ASA: IV  Anesthesia Plan: General   Post-op Pain Management:    Induction: Intravenous  PONV Risk Score and Plan: 3 and Dexamethasone, Ondansetron and Treatment may vary due to age or medical condition  Airway Management Planned: Oral ETT  Additional Equipment:   Intra-op Plan:   Post-operative Plan: Extubation in OR  Informed Consent: I have reviewed the patients History and Physical, chart, labs and discussed the procedure including the risks, benefits and alternatives for the proposed anesthesia with the patient or authorized representative who has indicated his/her understanding and acceptance.     Dental advisory given  Plan Discussed with: CRNA  Anesthesia Plan Comments: (Plan Full PPE use  Plan GETA D/w POA possible post op ventilation as needed , but are planning to extubate if tolerates )        Anesthesia Quick Evaluation

## 2018-06-24 NOTE — Anesthesia Postprocedure Evaluation (Signed)
Anesthesia Post Note  Patient: CICI RODRIGES  Procedure(s) Performed: CYSTOSCOPY WITH RIGHT RETROGRADE PYELOGRAM, RIGHT URETEROSCOPY AND RIGHT URETERAL STENT EXCHANGE (Right Ureter) HOLMIUM LASER APPLICATION (Right Ureter)  Patient location during evaluation: PACU Anesthesia Type: General Level of consciousness: awake and alert and patient cooperative Pain management: pain level controlled Vital Signs Assessment: post-procedure vital signs reviewed and stable Respiratory status: spontaneous breathing, respiratory function stable and nonlabored ventilation Cardiovascular status: stable Postop Assessment: no apparent nausea or vomiting Anesthetic complications: no     Last Vitals:  Vitals:   06/24/18 1138  BP: (!) 130/52  Pulse: 85  Resp: 20  Temp: 36.9 C  SpO2: 95%    Last Pain:  Vitals:   06/24/18 1138  TempSrc: Oral  PainSc: 0-No pain                 Mariyana Fulop

## 2018-06-24 NOTE — Anesthesia Procedure Notes (Signed)
Procedure Name: Intubation Date/Time: 06/24/2018 12:53 PM Performed by: Charmaine Downs, CRNA Pre-anesthesia Checklist: Patient identified, Patient being monitored, Timeout performed, Emergency Drugs available and Suction available Patient Re-evaluated:Patient Re-evaluated prior to induction Oxygen Delivery Method: Circle System Utilized Preoxygenation: Pre-oxygenation with 100% oxygen Induction Type: IV induction Ventilation: Mask ventilation without difficulty Laryngoscope Size: Mac and 3 Grade View: Grade II Tube type: Oral Tube size: 7.0 mm Number of attempts: 1 Airway Equipment and Method: stylet Placement Confirmation: ETT inserted through vocal cords under direct vision,  positive ETCO2 and breath sounds checked- equal and bilateral Secured at: 22 cm Tube secured with: Tape Dental Injury: Teeth and Oropharynx as per pre-operative assessment

## 2018-06-24 NOTE — Op Note (Signed)
.  Preoperative diagnosis: right ureteral calculi  Postoperative diagnosis: right renal calculi  Procedure: 1 cystoscopy 2. right retrograde pyelography 3.  Intraoperative fluoroscopy, under one hour, with interpretation 4.  right ureteroscopic stone manipulation with laser lithotripsy 5.  right 6 x 26 JJ stent exchange  Attending: Rosie Fate  Anesthesia: General  Estimated blood loss: None  Drains: right 6 x 26 JJ ureteral stent with tether  Specimens: stone for analysis  Antibiotics: rocephin  Findings: right mid and upper pole stone. No hydronephrosis. No masses/lesions in the bladder. Ureteral orifices in normal anatomic location.  Indications: Patient is a 68 year old female with a history of right ureteral calculus and sepsis who underwent stent placement 6 weeks ago.  After discussing treatment options, she decided proceed with right ureteroscopic stone manipulation.  Procedure her in detail: The patient was brought to the operating room and a brief timeout was done to ensure correct patient, correct procedure, correct site.  General anesthesia was administered patient was placed in dorsal lithotomy position.  Her genitalia was then prepped and draped in usual sterile fashion.  A rigid 24 French cystoscope was passed in the urethra and the bladder.  Bladder was inspected free masses or lesions.  the ureteral orifices were in the normal orthotopic locations. Using a grasper the right ureteral stent was brought to the urethral meatus. A zipwire was advanced up to the renal pelvis and the stent was removed. a 6 french ureteral catheter was then instilled into the right ureteral orifice.  a gentle retrograde was obtained and findings noted above. we then removed the cystoscope and cannulated the right ureteral orifice with a semirigid ureteroscope.  No stone was found in the ureter. Once we reached the UPJ a sensor wire was advanced in to the renal pelvis. We then removed the  ureteroscope and advanced am 12/14 x 38cm access sheath up to the renal pelvis. We then used the flexible ureteroscope to perform nephroscopy. We encountered the stone in the mid and upper poles. Using a 200nm laser fiber the stone was fragmented.    the fragments were then removed with a Ngage basket.    once all stone fragments were removed we then removed the access sheath under direct vision and noted no injury to the ureter. We then placed a 6 x 26 double-j ureteral stent over the original zip wire.  We then removed the wire and good coil was noted in the the renal pelvis under fluoroscopy and the bladder under direct vision. the bladder was then drained and this concluded the procedure which was well tolerated by patient.  Complications: None  Condition: Stable, extubated, transferred to PACU  Plan: Patient is to be discharged home as to follow-up in 2 weeks. She is to remove her stent by pulling the tether in 72 hours

## 2018-06-24 NOTE — Transfer of Care (Signed)
Immediate Anesthesia Transfer of Care Note  Patient: Rachel Morgan  Procedure(s) Performed: CYSTOSCOPY WITH RIGHT RETROGRADE PYELOGRAM, RIGHT URETEROSCOPY AND RIGHT URETERAL STENT EXCHANGE (Right Ureter) HOLMIUM LASER APPLICATION (Right Ureter)  Patient Location: PACU  Anesthesia Type:General  Level of Consciousness: awake, alert  and oriented  Airway & Oxygen Therapy: Patient Spontanous Breathing and Patient connected to nasal cannula oxygen  Post-op Assessment: Report given to RN and Post -op Vital signs reviewed and stable  Post vital signs: Reviewed and stable  Last Vitals:  Vitals Value Taken Time  BP    Temp    Pulse 85 06/24/18 1401  Resp 16 06/24/18 1401  SpO2 96 % 06/24/18 1401  Vitals shown include unvalidated device data.  Last Pain:  Vitals:   06/24/18 1138  TempSrc: Oral  PainSc: 0-No pain      Patients Stated Pain Goal: 6 (03/50/09 3818)  Complications: No apparent anesthesia complications

## 2018-06-24 NOTE — H&P (Signed)
Urology Admission H&P  Chief Complaint: right ureteral calculus  History of Present Illness: Rachel Morgan is a 68yo with a hx of nephrolithiasis who underwent right ureteral stent placement on 4/30 for sepsis from a ureteral calculus. No hematuria. No worsening LUTS. No fevers/chillss/sweats  Past Medical History:  Diagnosis Date  . Anxiety   . Calculus of gallbladder   . Cerebral palsy ([place])   . Chronic kidney disease   . Diabetes mellitus   . Gall stones   . GERD (gastroesophageal reflux disease)   . High cholesterol   . History of kidney stones   . Hypertension   . Hypothyroidism   . Kidney stones   . MR (mental retardation)   . Pyelonephritis   . Sepsis [facility])    Past Surgical History:  Procedure Laterality Date  . CYSTOSCOPY W/ URETERAL STENT PLACEMENT Bilateral 09/26/2015   Procedure: CYSTOSCOPY WITH Bilateral  RETROGRADE PYELOGRAM/URETERAL STENT PLACEMENT;  Surgeon: [name], MD;  Location: [facility];  Service: Urology;  Laterality: Bilateral;  . CYSTOSCOPY W/ URETERAL STENT PLACEMENT Right 04/22/2018   Procedure: CYSTOSCOPY WITH RETROGRADE PYELOGRAM/URETERAL STENT PLACEMENT;  Surgeon: Cleon Gustin, MD;  Location: [facility];  Service: Urology;  Laterality: Right;  . CYSTOSCOPY W/ URETERAL STENT REMOVAL Left 11/10/2015   Procedure: CYSTOSCOPY WITH STENT REMOVAL;  Surgeon: Cleon Gustin, MD;  Location: [facility];  Service: Urology;  Laterality: Left;  . CYSTOSCOPY WITH STENT PLACEMENT Right 01/21/2016   Procedure: CYSTOSCOPY WITH STENT PLACEMENT;  Surgeon: Franchot Gallo, MD;  Location: [facility];  Service: Urology;  Laterality: Right;  . CYSTOSCOPY/RETROGRADE/URETEROSCOPY/STONE EXTRACTION WITH BASKET Left 10/31/2015   Procedure: CYSTOSCOPY/ BILATERAL URETEROSCOPY/BILATERAL STONE EXTRACTION/ LEFT STENT PLACEMENT;  Surgeon: Irine Seal, MD;  Location: [facility];  Service: Urology;  Laterality: Left;  . CYSTOSCOPY/URETEROSCOPY/HOLMIUM LASER/STENT PLACEMENT Right 02/27/2016    Procedure: CYSTOSCOPY/RIGHT URETEROSCOPY/HOLMIUM LASER/STENT PLACEMENT/STENT REMOVAL;  Surgeon: Irine Seal, MD;  Location: [facility];  Service: Urology;  Laterality: Right;  . HOLMIUM LASER APPLICATION N/A 67/67/2094   Procedure: HOLMIUM LASER APPLICATION;  Surgeon: Irine Seal, MD;  Location: [facility];  Service: Urology;  Laterality: N/A;  . IR CV LINE INJECTION  05/06/2018  . IR FLUORO GUIDE PORT INSERTION RIGHT  04/10/2016  . IR US GUIDE VASC ACCESS RIGHT  04/10/2016    Home Medications:  Current Facility-Administered Medications  Medication Dose Route Frequency Provider Last Rate Last Dose  . cefTRIAXone (ROCEPHIN) 2 g in sodium chloride 0.9 % 100 mL IVPB  2 g Intravenous 30 min Pre-Op Princeston Blizzard, Candee Furbish, MD      . lactated ringers infusion   Intravenous Continuous Lenice Llamas, MD 50 mL/hr at 06/24/18 1144 1,000 mL at 06/24/18 1144  . sodium chloride flush (NS) 0.9 % injection 10 mL  10 mL Intracatheter PRN Lenice Llamas, MD      . water for irrigation, sterile for irrigation SOLN    PRN Alyson Ingles Candee Furbish, MD   500 mL at 06/24/18 1221   Allergies:  Allergies  Allergen Reactions  . Macrobid [Nitrofurantoin Monohyd Macro] Hives and Swelling  . Penicillins Swelling and Rash    Has patient had a PCN reaction causing immediate rash, facial/tongue/throat swelling, SOB or lightheadedness with hypotension: Yes Has patient had a PCN reaction causing severe rash involving mucus membranes or skin necrosis: Yes Has patient had a PCN reaction that required hospitalization: no Has patient had a PCN reaction occurring within the last 10 years: no If all of the above answers are "  NO", then may proceed with Cephalosporin use. Patient tolerated rocephin and ertapenem in the past    Family History  Family history unknown: Yes   Social History:  reports that she has never smoked. She has never used smokeless tobacco. She reports that she does not drink alcohol or use drugs.  Review of Systems   Genitourinary: Positive for flank pain.  All other systems reviewed and are negative.   Physical Exam:  Vital signs in last 24 hours: Temp:  [98.4 F (36.9 C)] 98.4 F (36.9 C) (06/17 1138) Pulse Rate:  [85] 85 (06/17 1138) Resp:  [20] 20 (06/17 1138) BP: (130)/(52) 130/52 (06/17 1138) SpO2:  [95 %] 95 % (06/17 1138) Weight:  [123.8 kg] 123.8 kg (06/17 1138) Physical Exam  Constitutional: She appears well-developed and well-nourished.  HENT:  Head: Normocephalic and atraumatic.  Eyes: Pupils are equal, round, and reactive to light. EOM are normal.  Neck: Normal range of motion. No thyromegaly present.  Cardiovascular: Normal rate and regular rhythm.  Respiratory: No respiratory distress.  GI: Soft. She exhibits no distension.  Musculoskeletal: Normal range of motion.        General: Edema present.  Neurological: She is alert.  Skin: Skin is warm and dry.  Psychiatric: She has a normal mood and affect. Her behavior is normal.    Laboratory Data:  No results found for this or any previous visit (from the past 24 hour(s)). No results found for this or any previous visit (from the past 240 hour(s)). Creatinine: No results for input(s): CREATININE in the last 168 hours. Baseline Creatinine: unknown  Impression/Assessment:  68yo with a right ureteral calculus  Plan:  The risks/benefits/alternatives to right ureteroscopic stone extraction was explained to the patient and gaurdian and they understand and wish to proceed with surgery  [name] 06/24/2018, 12:35 PM

## 2018-06-25 ENCOUNTER — Encounter (HOSPITAL_COMMUNITY): Payer: Self-pay | Admitting: Urology

## 2018-07-01 ENCOUNTER — Encounter (HOSPITAL_COMMUNITY): Payer: Self-pay | Admitting: Emergency Medicine

## 2018-07-01 ENCOUNTER — Emergency Department (HOSPITAL_COMMUNITY)
Admission: EM | Admit: 2018-07-01 | Discharge: 2018-07-01 | Disposition: A | Discharge status: Home / Self Care | Payer: Medicare Other | Attending: Emergency Medicine | Admitting: Emergency Medicine

## 2018-07-01 ENCOUNTER — Emergency Department (HOSPITAL_COMMUNITY): Payer: Medicare Other

## 2018-07-01 ENCOUNTER — Other Ambulatory Visit: Payer: Self-pay

## 2018-07-01 DIAGNOSIS — Z7982 Long term (current) use of aspirin: Secondary | ICD-10-CM | POA: Diagnosis not present

## 2018-07-01 DIAGNOSIS — Z79899 Other long term (current) drug therapy: Secondary | ICD-10-CM | POA: Insufficient documentation

## 2018-07-01 DIAGNOSIS — E039 Hypothyroidism, unspecified: Secondary | ICD-10-CM | POA: Insufficient documentation

## 2018-07-01 DIAGNOSIS — I5032 Chronic diastolic (congestive) heart failure: Secondary | ICD-10-CM | POA: Insufficient documentation

## 2018-07-01 DIAGNOSIS — N183 Chronic kidney disease, stage 3 (moderate): Secondary | ICD-10-CM | POA: Insufficient documentation

## 2018-07-01 DIAGNOSIS — I130 Hypertensive heart and chronic kidney disease with heart failure and stage 1 through stage 4 chronic kidney disease, or unspecified chronic kidney disease: Secondary | ICD-10-CM | POA: Insufficient documentation

## 2018-07-01 DIAGNOSIS — N179 Acute kidney failure, unspecified: Secondary | ICD-10-CM | POA: Insufficient documentation

## 2018-07-01 DIAGNOSIS — N390 Urinary tract infection, site not specified: Secondary | ICD-10-CM | POA: Diagnosis not present

## 2018-07-01 DIAGNOSIS — E1122 Type 2 diabetes mellitus with diabetic chronic kidney disease: Secondary | ICD-10-CM | POA: Diagnosis not present

## 2018-07-01 DIAGNOSIS — R1031 Right lower quadrant pain: Secondary | ICD-10-CM | POA: Diagnosis present

## 2018-07-01 DIAGNOSIS — R109 Unspecified abdominal pain: Secondary | ICD-10-CM

## 2018-07-01 DIAGNOSIS — F99 Mental disorder, not otherwise specified: Secondary | ICD-10-CM | POA: Diagnosis not present

## 2018-07-01 LAB — COMPREHENSIVE METABOLIC PANEL
ALT: 14 U/L (ref 0–44)
AST: 14 U/L — ABNORMAL LOW (ref 15–41)
Albumin: 3.3 g/dL — ABNORMAL LOW (ref 3.5–5.0)
Alkaline Phosphatase: 71 U/L (ref 38–126)
Anion gap: 14 (ref 5–15)
BUN: 38 mg/dL — ABNORMAL HIGH (ref 8–23)
CO2: 24 mmol/L (ref 22–32)
Calcium: 8.8 mg/dL — ABNORMAL LOW (ref 8.9–10.3)
Chloride: 102 mmol/L (ref 98–111)
Creatinine, Ser: 2.2 mg/dL — ABNORMAL HIGH (ref 0.44–1.00)
GFR calc Af Amer: 26 mL/min — ABNORMAL LOW (ref >60)
GFR calc non Af Amer: 23 mL/min — ABNORMAL LOW (ref >60)
Glucose, Bld: 103 mg/dL — ABNORMAL HIGH (ref 70–99)
Potassium: 4.2 mmol/L (ref 3.5–5.1)
Sodium: 140 mmol/L (ref 135–145)
Total Bilirubin: 0.5 mg/dL (ref 0.3–1.2)
Total Protein: 6.8 g/dL (ref 6.5–8.1)

## 2018-07-01 LAB — CBC WITH DIFFERENTIAL/PLATELET
Abs Immature Granulocytes: 0.05 10*3/uL (ref 0.00–0.07)
Basophils Absolute: 0.1 10*3/uL (ref 0.0–0.1)
Basophils Relative: 1 %
Eosinophils Absolute: 0 10*3/uL (ref 0.0–0.5)
Eosinophils Relative: 0 %
HCT: 34 % — ABNORMAL LOW (ref 36.0–46.0)
Hemoglobin: 10.2 g/dL — ABNORMAL LOW (ref 12.0–15.0)
Immature Granulocytes: 1 %
Lymphocytes Relative: 19 %
Lymphs Abs: 1.3 10*3/uL (ref 0.7–4.0)
MCH: 29.1 pg (ref 26.0–34.0)
MCHC: 30 g/dL (ref 30.0–36.0)
MCV: 96.9 fL (ref 80.0–100.0)
Monocytes Absolute: 0.7 10*3/uL (ref 0.1–1.0)
Monocytes Relative: 9 %
Neutro Abs: 5.1 10*3/uL (ref 1.7–7.7)
Neutrophils Relative %: 70 %
Platelets: 228 10*3/uL (ref 150–400)
RBC: 3.51 MIL/uL — ABNORMAL LOW (ref 3.87–5.11)
RDW: 14.3 % (ref 11.5–15.5)
WBC: 7.1 10*3/uL (ref 4.0–10.5)
nRBC: 0 % (ref 0.0–0.2)

## 2018-07-01 LAB — URINALYSIS, ROUTINE W REFLEX MICROSCOPIC
Bilirubin Urine: NEGATIVE
Glucose, UA: NEGATIVE mg/dL
Ketones, ur: NEGATIVE mg/dL
Nitrite: POSITIVE — AB
Protein, ur: NEGATIVE mg/dL
Specific Gravity, Urine: 1.008 (ref 1.005–1.030)
WBC, UA: >50 WBC/hpf — ABNORMAL HIGH (ref 0–5)
pH: 5 (ref 5.0–8.0)

## 2018-07-01 LAB — LACTIC ACID, PLASMA
Lactic Acid, Venous: 1.2 mmol/L (ref 0.5–1.9)
Lactic Acid, Venous: 1 mmol/L (ref 0.5–1.9)

## 2018-07-01 LAB — MAGNESIUM: Magnesium: 1.5 mg/dL — ABNORMAL LOW (ref 1.7–2.4)

## 2018-07-01 MED ORDER — MAGNESIUM SULFATE IN D5W 1-5 GM/100ML-% IV SOLN
1.0000 g | Freq: Once | INTRAVENOUS | Status: AC
  Administered 2018-07-01: 1 g via INTRAVENOUS
  Filled 2018-07-01: qty 100

## 2018-07-01 MED ORDER — CEFDINIR 300 MG PO CAPS: 300.0000 mg | ORAL_CAPSULE | Freq: Two times a day (BID) | ORAL | 0 refills | Status: DC

## 2018-07-01 MED ORDER — SODIUM CHLORIDE 0.9 % IV BOLUS
500.0000 mL | Freq: Once | INTRAVENOUS | Status: AC
  Administered 2018-07-01: 500 mL via INTRAVENOUS

## 2018-07-01 NOTE — ED Provider Notes (Signed)
CONTINUING CARE FROM [facility].  Patient is a 68 year old female who presents to the emergency department with a complaint of dysuria.  The patient presented to the emergency department with her sister who is her primary caregiver and historian.  The patient has an intellectual disability and cerebral palsy.  The patient had a stent in place in the right ureter and this was accidentally removed while the family was helping the patient shower.  There was report of a foul-smelling urine.  The patient has a history of infected stones, and they brought the patient to the emergency department for evaluation.  Work-up is in progress.  Urine analysis and CT scan pending.  Urine analysis reveals a yellow hazy specimen with a specific gravity of 1.008.  There is moderate blood present both nitrates and leukocytE esterase positive.  White blood cells are elevated at 50, there are white blood cell clumps present. CT renal stone study shows a possible punctate 2 mm stone within the distal left ureter about 1.5 cm cephalad to the left UVJ.  Slightly prominent left extrarenal pelvis but no significant calyceal dilatation.  There is a mild right hydronephrosis present there are some intrarenal stones bilaterally.  There is some edema and subcutaneous fat infiltration over the right inferior gluteal region, possibly due to decubitus ulcer.  Will recheck.  The patient is more awake and alert according to the sister.  Patient examined with the sister present.  No decubitus ulcer appreciated at this time.  There are areas of increased redness.  I have asked the sister to increase the frequency of turning and repositioning the patient. Pt will be started on Omnicef for infection. F/U with PCP concerning infection and notify urology concerning the [place].    Dx: UTI, AKI   Annette Stable 07/01/18 Deberah Pelton, MD 07/02/18 1724    Nat Christen, MD 07/02/18 1728

## 2018-07-01 NOTE — ED Provider Notes (Signed)
[facility] EMERGENCY DEPARTMENT Provider Note   CSN: [id number] Arrival date & time: 07/01/18  1315     History   Chief Complaint Chief Complaint  Patient presents with   Dysuria    HPI Rachel Morgan is a 68 y.o. female.     HPI  Patient is a 67 year old female past medical history of type 2 diabetes mellitus, not on antihyperglycemic's, intellectual disability, cerebral palsy, recurrent nephro lithiasis, CKD presenting for right flank pain and foul urine.  She presents with her sister who is her caregiver and provides most of the history.  According to patient's sister, she had a stent exchanged of her right ureter on 06-27-2018.  When she was helping the patient shower a day later it was accidentally removed.  Ever since then, patient has had some foul-smelling urine.  She also reports patient has been less ambulatory than usual and slightly more weak.  She denies any fevers at home.  Patient has not been vomiting.  Patient did gesture that she has some vague pain in the right flank, but otherwise has not complained.  Patient is not on any antibiotics.  Patient does have a history of urosepsis.  Past Medical History:  Diagnosis Date   Anxiety    Calculus of gallbladder    Cerebral palsy ([place])    Chronic kidney disease    Diabetes mellitus    Gall stones    GERD (gastroesophageal reflux disease)    High cholesterol    History of kidney stones    Hypertension    Hypothyroidism    Kidney stones    MR (mental retardation)    Pyelonephritis    Sepsis ([place])     Patient Active Problem List   Diagnosis Date Noted   Acute renal failure superimposed on stage 3 chronic kidney disease ([place]) 06/03/2018   Acute on chronic diastolic CHF (congestive heart failure) ([place]) 06/03/2018   Acute respiratory failure with hypoxia ([place]) 06/03/2018   Hypokalemia 06/03/2018   Morbid obesity with BMI of 50.0-59.9, adult ([place]) 06/03/2018   Acute exacerbation of CHF  (congestive heart failure) ([place]) 06/01/2018   Hypoxia 06/01/2018   Pneumonia 05/09/2018   Acute respiratory failure ([place]) 78/29/5621   Acute metabolic encephalopathy 30/86/5784   Hypomagnesemia 04/21/2018   UTI (urinary tract infection) 04/20/2018   PICC (peripherally inserted central catheter) in place    Urinary tract infection without hematuria    Hydronephrosis with renal and ureteral calculus obstruction 01/21/2016   Hyperkalemia 01/21/2016   Palliative care encounter    DNR (do not resuscitate) discussion    Encephalopathy 10/17/2015   Goals of care, counseling/discussion 10/17/2015   Nephrolithiasis 09/27/2015   Cerebral palsy ([place]) 09/27/2015   Sepsis ([place]) 09/26/2015   AKI (acute kidney injury) ([place]) 09/26/2015   Fever    Urinary tract infectious disease    Dilated cbd, acquired 09/14/2015   Hypothyroidism 09/14/2015   Hypertension 09/14/2015   Depression with anxiety 09/14/2015   UPJ obstruction, acquired 09/14/2015   CKD (chronic kidney disease), stage III ([place]) 09/14/2015   GERD (gastroesophageal reflux disease) 09/14/2015   Calculus of gallbladder without cholecystitis without obstruction    Abdominal pain, right upper quadrant 02/11/2011   Acute pyelonephritis 02/11/2011   Agoraphobia 02/11/2011   Acute renal failure ([place]) 02/11/2011   Hyponatremia 02/11/2011   Dehydration 02/11/2011    Past Surgical History:  Procedure Laterality Date   CYSTOSCOPY W/ URETERAL STENT PLACEMENT Bilateral 09/26/2015   Procedure: CYSTOSCOPY WITH Bilateral  RETROGRADE PYELOGRAM/URETERAL  STENT PLACEMENT;  Surgeon: [name], MD;  Location: [facility];  Service: Urology;  Laterality: Bilateral;   CYSTOSCOPY W/ URETERAL STENT PLACEMENT Right 04/22/2018   Procedure: CYSTOSCOPY WITH RETROGRADE PYELOGRAM/URETERAL STENT PLACEMENT;  Surgeon: Cleon Gustin, MD;  Location: [facility];  Service: Urology;  Laterality: Right;   CYSTOSCOPY W/ URETERAL STENT  REMOVAL Left 11/10/2015   Procedure: CYSTOSCOPY WITH STENT REMOVAL;  Surgeon: Cleon Gustin, MD;  Location: [facility];  Service: Urology;  Laterality: Left;   CYSTOSCOPY WITH RETROGRADE PYELOGRAM, URETEROSCOPY AND STENT PLACEMENT Right 06/24/2018   Procedure: CYSTOSCOPY WITH RIGHT RETROGRADE PYELOGRAM, RIGHT URETEROSCOPY AND RIGHT URETERAL STENT EXCHANGE;  Surgeon: Cleon Gustin, MD;  Location: [facility];  Service: Urology;  Laterality: Right;   CYSTOSCOPY WITH STENT PLACEMENT Right 01/21/2016   Procedure: CYSTOSCOPY WITH STENT PLACEMENT;  Surgeon: Franchot Gallo, MD;  [facility];  Service: Urology;  Laterality: Right;   CYSTOSCOPY/RETROGRADE/URETEROSCOPY/STONE EXTRACTION WITH BASKET Left 10/31/2015   Procedure: CYSTOSCOPY/ BILATERAL URETEROSCOPY/BILATERAL STONE EXTRACTION/ LEFT STENT PLACEMENT;  Surgeon: Irine Seal, MD;  Location: [facility];  Service: Urology;  Laterality: Left;   CYSTOSCOPY/URETEROSCOPY/HOLMIUM LASER/STENT PLACEMENT Right 02/27/2016   Procedure: CYSTOSCOPY/RIGHT URETEROSCOPY/HOLMIUM LASER/STENT PLACEMENT/STENT REMOVAL;  Surgeon: Irine Seal, MD;  [facility];  Service: Urology;  Laterality: Right;   HOLMIUM LASER APPLICATION N/A 66/06/3014   Procedure: HOLMIUM LASER APPLICATION;  Surgeon: Irine Seal, MD;  [facility];  Service: Urology;  Laterality: N/A;   HOLMIUM LASER APPLICATION Right 0/10/9321   Procedure: HOLMIUM LASER APPLICATION;  Surgeon: Cleon Gustin, MD;  Location: [facility];  Service: Urology;  Laterality: Right;   IR CV LINE INJECTION  05/06/2018   IR FLUORO GUIDE PORT INSERTION RIGHT  04/10/2016   IR US GUIDE VASC ACCESS RIGHT  04/10/2016     OB History   No obstetric history on file.      Home Medications    Prior to Admission medications   Medication Sig Start Date End Date Taking? Authorizing Provider  acetaminophen (TYLENOL) 325 MG tablet Take 2 tablets (650 mg total) by mouth every 6 (six) hours as needed for mild pain (or  Fever >/= 101). 06/08/18   Roxan Hockey, MD  ALPRAZolam Duanne Moron) 1 MG tablet Take 1 tablet (1 mg total) by mouth 2 (two) times daily as needed for anxiety. 06/17/18   Roxan Hockey, MD  aspirin EC 81 MG tablet Take 1 tablet (81 mg total) by mouth daily with breakfast. 06/08/18   Roxan Hockey, MD  blood glucose meter kit and supplies Relion Prime or Dispense other brand based on patient and insurance preference. Use up to four times daily as directed. (FOR ICD-9 250.00, 250.01).  DX--R 73.9 06/17/18   Emokpae, Courage, MD  busPIRone (BUSPAR) 10 MG tablet Take 10 mg by mouth 2 (two) times daily.    [provider]  escitalopram (LEXAPRO) 20 MG tablet Take 1 tablet (20 mg total) by mouth daily. 06/17/18   Roxan Hockey, MD  glucose blood test strip Use as instructed--- Dx R73.9 06/17/18   Roxan Hockey, MD  levothyroxine (SYNTHROID, LEVOTHROID) 50 MCG tablet Take 50 mcg by mouth daily before breakfast.    [provider]  midodrine (PROAMATINE) 10 MG tablet Take 1 tablet (10 mg total) by mouth 3 (three) times daily with meals. 06/17/18   Emokpae, Courage, MD  OLANZapine (ZYPREXA) 20 MG tablet Take 20 mg by mouth at bedtime.    [provider]  omeprazole (PRILOSEC) 20 MG capsule Take  20 mg by mouth daily before breakfast.     [provider]  oxybutynin (DITROPAN-XL) 10 MG 24 hr tablet Take 10 mg by mouth every morning.     [provider]  potassium chloride SA (K-DUR) 20 MEQ tablet Take 2 tablets (40 mEq total) by mouth daily. 06/17/18   Emokpae, Courage, MD  senna-docusate (SENOKOT-S) 8.6-50 MG tablet Take 1 tablet by mouth 2 (two) times daily. Patient taking differently: Take 1 tablet by mouth 2 (two) times daily as needed.  01/26/16   Raiford Noble Latif, DO  sodium bicarbonate 650 MG tablet Take 1 tablet (650 mg total) by mouth daily. 06/08/18   Roxan Hockey, MD  torsemide (DEMADEX) 20 MG tablet Take 1 tablet (20 mg total) by mouth 2 (two)  times daily. For Fluid 06/08/18   Roxan Hockey, MD  traMADol (ULTRAM) 50 MG tablet Take 1 tablet (50 mg total) by mouth every 6 (six) hours as needed. 06/24/18 06/24/19  McKenzie, Candee Furbish, MD  zolpidem (AMBIEN) 5 MG tablet Take 1 tablet (5 mg total) by mouth at bedtime as needed for sleep. 06/17/18   Roxan Hockey, MD    Family History Family History  Family history unknown: Yes    Social History Social History   Tobacco Use   Smoking status: Never Smoker   Smokeless tobacco: Never Used  Substance Use Topics   Alcohol use: No   Drug use: No     Allergies   Macrobid [nitrofurantoin monohyd macro] and Penicillins   Review of Systems Review of Systems  Constitutional: Negative for chills and fever.  Respiratory: Negative for cough.   Cardiovascular: Negative for chest pain.  Gastrointestinal: Negative for abdominal pain, nausea and vomiting.  Genitourinary: Positive for dysuria and flank pain.   Unable to obtain remainder of ROS due to intellectual disability.  Physical Exam Updated Vital Signs BP (!) 127/57 (BP Location: Right Arm)    Pulse 92    Temp (!) 97.5 F (36.4 C) (Oral)    Resp 20    Ht 5' (1.524 m)    Wt 113.4 kg    SpO2 95%    BMI 48.82 kg/m   Physical Exam Vitals signs and nursing note reviewed.  Constitutional:      General: She is not in acute distress.    Appearance: She is well-developed. She is obese.     Comments: Alert, minimally responsive to questioning.  Patient largely nonverbal.  She does interact on exam but does not follow commands.  HENT:     Head: Normocephalic and atraumatic.     Mouth/Throat:     Mouth: Mucous membranes are dry.  Eyes:     Conjunctiva/sclera: Conjunctivae normal.     Pupils: Pupils are equal, round, and reactive to light.  Neck:     Musculoskeletal: Normal range of motion and neck supple.  Cardiovascular:     Rate and Rhythm: Normal rate and regular rhythm.     Heart sounds: S1 normal and S2 normal. No  murmur.  Pulmonary:     Effort: Pulmonary effort is normal.     Breath sounds: Normal breath sounds. No wheezing or rales.  Abdominal:     General: There is no distension.     Palpations: Abdomen is soft.     Tenderness: There is abdominal tenderness. There is no guarding.     Comments: Mild right flank TTP to deep palpation.   Musculoskeletal: Normal range of motion.  General: No deformity.  Lymphadenopathy:     Cervical: No cervical adenopathy.  Skin:    General: Skin is warm and dry.     Findings: No erythema or rash.  Neurological:     Mental Status: She is alert.     Comments: Cranial nerves grossly intact. Patient moves extremities symmetrically and with good coordination.      ED Treatments / Results  Labs (all labs ordered are listed, but only abnormal results are displayed) Labs Reviewed  COMPREHENSIVE METABOLIC PANEL - Abnormal; Notable for the following components:      Result Value   Glucose, Bld 103 (*)    BUN 38 (*)    Creatinine, Ser 2.20 (*)    Calcium 8.8 (*)    Albumin 3.3 (*)    AST 14 (*)    GFR calc non Af Amer 23 (*)    GFR calc Af Amer 26 (*)    All other components within normal limits  CBC WITH DIFFERENTIAL/PLATELET - Abnormal; Notable for the following components:   RBC 3.51 (*)    Hemoglobin 10.2 (*)    HCT 34.0 (*)    All other components within normal limits  MAGNESIUM - Abnormal; Notable for the following components:   Magnesium 1.5 (*)    All other components within normal limits  URINE CULTURE  CULTURE, BLOOD (ROUTINE X 2)  CULTURE, BLOOD (ROUTINE X 2)  LACTIC ACID, PLASMA  LACTIC ACID, PLASMA  URINALYSIS, ROUTINE W REFLEX MICROSCOPIC    EKG    EKG Interpretation  Date/Time:  Wednesday July 01 2018 16:02:37 EDT Ventricular Rate:  78 PR Interval:    QRS Duration: 68 QT Interval:  600 QTC Calculation: 680 R Axis:   41 Text Interpretation:  Sinus rhythm Atrial premature complex Low voltage, precordial leads  Borderline T abnormalities, diffuse leads Prolonged QT interval Confirmed by Nat Christen [phone]) on 07/01/2018 5:00:34 PM       Radiology No results found.  Procedures Procedures (including critical care time)  Medications Ordered in ED Medications - No data to display   Initial Impression / Assessment and Plan / ED Course  I have reviewed the triage vital signs and the nursing notes.  Pertinent labs & imaging results that were available during my care of the patient were reviewed by me and considered in my medical decision making (see chart for details).        This is a medically complex 68 year old female with a past medical history of intellectual disability, type 2 diabetes mellitus, recent stent and accidental removal of her right renal stent as well as nephrolithiasis and urosepsis presenting for foul-smelling urine and weakness.  Differential diagnosis includes pyelonephritis, nephrolithiasis, appendicitis.   Work-up demonstrating stable anemia of 10.2.  Normal WBC count.  Creatinine is at baseline at 2.2.  BUN 38 which is slightly elevated.  Patient is receiving soft IV fluids.  Urinalysis is pending.  CT abdomen and pelvis renal stone study is pending.  QTC is 680 on EKG. Mg is 1.5.  Potassium is normal.  Spoke with pharmacist, Annie Main who recommends 1 g of magnesium.  Care signed out to Lily Kocher, PA-C at 5:10 PM.   This is a shared visit with Dr. Elnora Morrison. Patient was independently evaluated by this attending physician. Attending physician consulted in evaluation and management.  Final Clinical Impressions(s) / ED Diagnoses   Final diagnoses:  Dysuria  Right flank pain  AKI (acute kidney injury) [facility])    ED  Discharge Orders    None       Tamala Julian 07/01/18 1713    Elnora Morrison, MD 07/04/18 [phone]

## 2018-07-01 NOTE — ED Notes (Signed)
This nurse removed IV from Right Chest Port.

## 2018-07-01 NOTE — Discharge Instructions (Signed)
The work-up today suggest a urinary tract infection.  There continues to be issues with the kidneys.  There is a tiny stone near the end of the ureter, but no obstruction.  The work-up does not show any evidence of sepsis today.  There is some increased redness on the buttocks area.  Please increase the frequency of changing position and turning the patient to prevent decubitus ulcers.  Please use Omnicef 2 times daily for the urinary tract infection.  Please have the urine rechecked in 7 to 10 days to ensure the infection has cleared.  Return to the emergency department if any changes in your condition, problems, or concerns.

## 2018-07-01 NOTE — ED Triage Notes (Signed)
Patient brought in by EMS for complaint of foul smelling urine for unknown time.

## 2018-07-03 LAB — URINE CULTURE: Culture: >=100000 — AB

## 2018-07-04 NOTE — Progress Notes (Signed)
ED Antimicrobial Stewardship Positive Culture Follow Up   Rachel Morgan is an 68 y.o. female who presented to [facility] on 07/01/2018 with a chief complaint of  Chief Complaint  Patient presents with  . Dysuria    Recent Results (from the past 720 hour(s))  Urine Culture     Status: Abnormal   Collection Time: 06/12/18 12:47 PM   Specimen: Urine, Clean Catch  Result Value Ref Range Status   Specimen Description   Final    URINE, CLEAN CATCH Performed at [facility], [address], [place], [place] [address]    Special Requests   Final    NONE Performed at [facility], [address], [place], [place] [address]    Culture >=100,000 COLONIES/mL KLEBSIELLA PNEUMONIAE (A)  Final   Report Status 06/15/2018 FINAL  Final   Organism ID, Bacteria KLEBSIELLA PNEUMONIAE (A)  Final      Susceptibility   Klebsiella pneumoniae - MIC*    AMPICILLIN >=32 RESISTANT Resistant     CEFAZOLIN <=4 SENSITIVE Sensitive     CEFTRIAXONE <=1 SENSITIVE Sensitive     CIPROFLOXACIN <=0.25 SENSITIVE Sensitive     GENTAMICIN <=1 SENSITIVE Sensitive     IMIPENEM <=0.25 SENSITIVE Sensitive     NITROFURANTOIN 128 RESISTANT Resistant     TRIMETH/SULFA <=20 SENSITIVE Sensitive     AMPICILLIN/SULBACTAM 8 SENSITIVE Sensitive     PIP/TAZO 8 SENSITIVE Sensitive     Extended ESBL NEGATIVE Sensitive     * >=100,000 COLONIES/mL KLEBSIELLA PNEUMONIAE  SARS Coronavirus 2 (CEPHEID- Performed in [place] hospital lab), Hosp Order     Status: None   Collection Time: 06/12/18 12:51 PM   Specimen: Nasopharyngeal Swab  Result Value Ref Range Status   SARS Coronavirus 2 NEGATIVE NEGATIVE Final    Comment: (NOTE) If result is NEGATIVE SARS-CoV-2 target nucleic acids are NOT DETECTED. The SARS-CoV-2 RNA is generally detectable in upper and lower  respiratory specimens during the acute phase of infection. The lowest  concentration of SARS-CoV-2 viral copies this assay can detect is 250  copies / mL. A negative  result does not preclude SARS-CoV-2 infection  and should not be used as the sole basis for treatment or other  patient management decisions.  A negative result may occur with  improper specimen collection / handling, submission of specimen other  than nasopharyngeal swab, presence of viral mutation(s) within the  areas targeted by this assay, and inadequate number of viral copies  (<250 copies / mL). A negative result must be combined with clinical  observations, patient history, and epidemiological information. If result is POSITIVE SARS-CoV-2 target nucleic acids are DETECTED. The SARS-CoV-2 RNA is generally detectable in upper and lower  respiratory specimens dur ing the acute phase of infection.  Positive  results are indicative of active infection with SARS-CoV-2.  Clinical  correlation with patient history and other diagnostic information is  necessary to determine patient infection status.  Positive results do  not rule out bacterial infection or co-infection with other viruses. If result is PRESUMPTIVE POSTIVE SARS-CoV-2 nucleic acids MAY BE PRESENT.   A presumptive positive result was obtained on the submitted specimen  and confirmed on repeat testing.  While 2019 novel coronavirus  (SARS-CoV-2) nucleic acids may be present in the submitted sample  additional confirmatory testing may be necessary for epidemiological  and / or clinical management purposes  to differentiate between  SARS-CoV-2 and other Sarbecovirus currently known to infect humans.  If clinically indicated additional  testing with an alternate test  methodology [phone]) is advised. The SARS-CoV-2 RNA is generally  detectable in upper and lower respiratory sp ecimens during the acute  phase of infection. The expected result is Negative. Fact Sheet for Patients:  StrictlyIdeas.no Fact Sheet for Healthcare Providers: BankingDealers.co.za This test is not yet  approved or cleared by the [place] FDA and has been authorized for detection and/or diagnosis of SARS-CoV-2 by FDA under an Emergency Use Authorization (EUA).  This EUA will remain in effect (meaning this test can be used) for the duration of the COVID-19 declaration under Section 564(b)(1) of the Act, 21 U.S.C. section 360bbb-3(b)(1), unless the authorization is terminated or revoked sooner. Performed at [facility], [address] [place], [place] [address]   Blood Culture (routine x 2)     Status: None   Collection Time: 06/12/18  1:24 PM   Specimen: BLOOD LEFT ARM  Result Value Ref Range Status   Specimen Description BLOOD LEFT ARM  Final   Special Requests   Final    BOTTLES DRAWN AEROBIC AND ANAEROBIC Blood Culture adequate volume   Culture   Final    NO GROWTH 5 DAYS Performed at [facility], [address], [place], [place] [address]    Report Status 06/17/2018 FINAL  Final  Blood Culture (routine x 2)     Status: None   Collection Time: 06/12/18  1:35 PM   Specimen: BLOOD LEFT ARM  Result Value Ref Range Status   Specimen Description BLOOD LEFT ARM  Final   Special Requests   Final    BOTTLES DRAWN AEROBIC AND ANAEROBIC Blood Culture results may not be optimal due to an inadequate volume of blood received in culture bottles   Culture   Final    NO GROWTH 5 DAYS Performed at [facility], [address] [place], [place] [address]    Report Status 06/17/2018 FINAL  Final  MRSA PCR Screening     Status: None   Collection Time: 06/12/18  7:47 PM   Specimen: Nasal Mucosa; Nasopharyngeal  Result Value Ref Range Status   MRSA by PCR NEGATIVE NEGATIVE Final    Comment:        The GeneXpert MRSA Assay (FDA approved for NASAL specimens only), is one component of a comprehensive MRSA colonization surveillance program. It is not intended to diagnose MRSA infection nor to guide or monitor treatment for MRSA infections. Performed at [facility], [address],  [place], [place] [address]   Culture, blood (routine x 2)     Status: None (Preliminary result)   Collection Time: 07/01/18  3:34 PM   Specimen: BLOOD RIGHT ARM  Result Value Ref Range Status   Specimen Description BLOOD RIGHT ARM  Final   Special Requests   Final    BOTTLES DRAWN AEROBIC AND ANAEROBIC Blood Culture adequate volume   Culture   Final    NO GROWTH 3 DAYS Performed at [facility], [address], [place], [place] [address]    Report Status PENDING  Incomplete  Culture, blood (routine x 2)     Status: None (Preliminary result)   Collection Time: 07/01/18  3:35 PM   Specimen: BLOOD LEFT ARM  Result Value Ref Range Status   Specimen Description BLOOD LEFT ARM  Final   Special Requests   Final    BOTTLES DRAWN AEROBIC AND ANAEROBIC Blood Culture adequate volume   Culture   Final    NO GROWTH 3 DAYS Performed at  [facility], [address], [place], [place] [address]    Report Status PENDING  Incomplete  Urine culture     Status: Abnormal   Collection Time: 07/01/18  5:00 PM   Specimen: Urine, Catheterized  Result Value Ref Range Status   Specimen Description   Final    URINE, CATHETERIZED Performed at [facility], [address], [place], [place] [address]    Special Requests   Final    NONE Performed at [facility], [address], [place], [place] [address]    Culture (A)  Final    >=100,000 COLONIES/mL ESCHERICHIA COLI Confirmed Extended Spectrum Beta-Lactamase Producer (ESBL).  In bloodstream infections from ESBL organisms, carbapenems are preferred over piperacillin/tazobactam. They are shown to have a lower risk of mortality.    Report Status 07/03/2018 FINAL  Final   Organism ID, Bacteria ESCHERICHIA COLI (A)  Final      Susceptibility   Escherichia coli - MIC*    AMPICILLIN >=32 RESISTANT Resistant     CEFAZOLIN >=64 RESISTANT Resistant     CEFTRIAXONE >=64 RESISTANT Resistant     CIPROFLOXACIN >=4 RESISTANT Resistant     GENTAMICIN >=16 RESISTANT  Resistant     IMIPENEM <=0.25 SENSITIVE Sensitive     NITROFURANTOIN <=16 SENSITIVE Sensitive     TRIMETH/SULFA <=20 SENSITIVE Sensitive     AMPICILLIN/SULBACTAM >=32 RESISTANT Resistant     PIP/TAZO 8 SENSITIVE Sensitive     Extended ESBL POSITIVE Resistant     * >=100,000 COLONIES/mL ESCHERICHIA COLI    [x]  Treated with cefdinir, organism resistant to prescribed antimicrobial []  Patient discharged originally without antimicrobial agent and treatment is now indicated  New antibiotic prescription: DC cefdinir, start bactrim single strength (400-80mg ), 1 tablet PO BID x 10 days. Recommend that patient follow-up with urology.   ED Provider: Suella Broad, PA   Neisha Hinger, Rande Lawman 07/04/2018, 9:19 AM Clinical Pharmacist Monday - Friday phone -  [phone] Saturday - Sunday phone - [phone]

## 2018-07-05 ENCOUNTER — Telehealth: Payer: Self-pay | Admitting: Emergency Medicine

## 2018-07-05 NOTE — Telephone Encounter (Signed)
Post ED Visit - Positive Culture Follow-up: Successful Patient Follow-Up  Culture assessed and recommendations reviewed by:  []  Elenor Quinones, Pharm.D. []  Heide Guile, Pharm.D., BCPS AQ-ID []  Parks Neptune, Pharm.D., BCPS []  Alycia Rossetti, Pharm.D., BCPS []  [place], [place].D., BCPS, AAHIVP []  Legrand Como, Pharm.D., BCPS, AAHIVP [x]  Salome Arnt, PharmD, BCPS []  Johnnette Gourd, PharmD, BCPS []  Hughes Better, PharmD, BCPS []  Leeroy Cha, PharmD  Positive urine culture  []  Patient discharged without antimicrobial prescription and treatment is now indicated [x]  Organism is resistant to prescribed ED discharge antimicrobial []  Patient with positive blood cultures  Changes discussed with ED provider: Suella Broad PA  New antibiotic prescription: Bactrim (400-80 mg single strength) PO BID x ten days Called to [facility] [phone]  Contacted patient's sister/caregiver, date 07/05/2018, time [place] 07/05/2018, 1:56 PM

## 2018-07-06 LAB — CULTURE, BLOOD (ROUTINE X 2)
Culture: NO GROWTH
Culture: NO GROWTH
Special Requests: ADEQUATE
Special Requests: ADEQUATE

## 2018-07-07 LAB — CALCULI, WITH PHOTOGRAPH (CLINICAL LAB)
Uric Acid Calculi: 100 %
Weight Calculi: 81 mg

## 2018-07-08 ENCOUNTER — Ambulatory Visit (INDEPENDENT_AMBULATORY_CARE_PROVIDER_SITE_OTHER): Payer: Medicare Other | Admitting: Urology

## 2018-07-08 DIAGNOSIS — N201 Calculus of ureter: Secondary | ICD-10-CM | POA: Diagnosis not present

## 2018-07-12 ENCOUNTER — Other Ambulatory Visit: Payer: Self-pay

## 2018-07-12 ENCOUNTER — Emergency Department (HOSPITAL_COMMUNITY): Payer: Medicare Other

## 2018-07-12 ENCOUNTER — Emergency Department (HOSPITAL_COMMUNITY)
Admission: EM | Admit: 2018-07-12 | Discharge: 2018-07-12 | Disposition: A | Discharge status: Home / Self Care | Payer: Medicare Other | Attending: Emergency Medicine | Admitting: Emergency Medicine

## 2018-07-12 ENCOUNTER — Encounter (HOSPITAL_COMMUNITY): Payer: Self-pay | Admitting: Emergency Medicine

## 2018-07-12 DIAGNOSIS — I5032 Chronic diastolic (congestive) heart failure: Secondary | ICD-10-CM | POA: Diagnosis not present

## 2018-07-12 DIAGNOSIS — E1122 Type 2 diabetes mellitus with diabetic chronic kidney disease: Secondary | ICD-10-CM | POA: Insufficient documentation

## 2018-07-12 DIAGNOSIS — N183 Chronic kidney disease, stage 3 (moderate): Secondary | ICD-10-CM | POA: Diagnosis not present

## 2018-07-12 DIAGNOSIS — M6281 Muscle weakness (generalized): Secondary | ICD-10-CM | POA: Diagnosis not present

## 2018-07-12 DIAGNOSIS — E039 Hypothyroidism, unspecified: Secondary | ICD-10-CM | POA: Diagnosis not present

## 2018-07-12 DIAGNOSIS — Z79899 Other long term (current) drug therapy: Secondary | ICD-10-CM | POA: Insufficient documentation

## 2018-07-12 DIAGNOSIS — Z7982 Long term (current) use of aspirin: Secondary | ICD-10-CM | POA: Diagnosis not present

## 2018-07-12 DIAGNOSIS — F99 Mental disorder, not otherwise specified: Secondary | ICD-10-CM | POA: Insufficient documentation

## 2018-07-12 DIAGNOSIS — Z03818 Encounter for observation for suspected exposure to other biological agents ruled out: Secondary | ICD-10-CM | POA: Insufficient documentation

## 2018-07-12 DIAGNOSIS — I130 Hypertensive heart and chronic kidney disease with heart failure and stage 1 through stage 4 chronic kidney disease, or unspecified chronic kidney disease: Secondary | ICD-10-CM | POA: Insufficient documentation

## 2018-07-12 DIAGNOSIS — R531 Weakness: Secondary | ICD-10-CM

## 2018-07-12 LAB — CBC WITH DIFFERENTIAL/PLATELET
Abs Immature Granulocytes: 0.02 10*3/uL (ref 0.00–0.07)
Basophils Absolute: 0 10*3/uL (ref 0.0–0.1)
Basophils Relative: 0 %
Eosinophils Absolute: 0 10*3/uL (ref 0.0–0.5)
Eosinophils Relative: 0 %
HCT: 37.7 % (ref 36.0–46.0)
Hemoglobin: 11.2 g/dL — ABNORMAL LOW (ref 12.0–15.0)
Immature Granulocytes: 0 %
Lymphocytes Relative: 22 %
Lymphs Abs: 1.5 10*3/uL (ref 0.7–4.0)
MCH: 28.9 pg (ref 26.0–34.0)
MCHC: 29.7 g/dL — ABNORMAL LOW (ref 30.0–36.0)
MCV: 97.2 fL (ref 80.0–100.0)
Monocytes Absolute: 0.6 10*3/uL (ref 0.1–1.0)
Monocytes Relative: 9 %
Neutro Abs: 4.7 10*3/uL (ref 1.7–7.7)
Neutrophils Relative %: 69 %
Platelets: 239 10*3/uL (ref 150–400)
RBC: 3.88 MIL/uL (ref 3.87–5.11)
RDW: 14.4 % (ref 11.5–15.5)
WBC: 6.9 10*3/uL (ref 4.0–10.5)
nRBC: 0 % (ref 0.0–0.2)

## 2018-07-12 LAB — URINALYSIS, ROUTINE W REFLEX MICROSCOPIC
Bilirubin Urine: NEGATIVE
Glucose, UA: NEGATIVE mg/dL
Hgb urine dipstick: NEGATIVE
Ketones, ur: NEGATIVE mg/dL
Leukocytes,Ua: NEGATIVE
Nitrite: NEGATIVE
Protein, ur: NEGATIVE mg/dL
Specific Gravity, Urine: 1.008 (ref 1.005–1.030)
pH: 5 (ref 5.0–8.0)

## 2018-07-12 LAB — COMPREHENSIVE METABOLIC PANEL
ALT: 13 U/L (ref 0–44)
AST: 13 U/L — ABNORMAL LOW (ref 15–41)
Albumin: 3.6 g/dL (ref 3.5–5.0)
Alkaline Phosphatase: 80 U/L (ref 38–126)
Anion gap: 15 (ref 5–15)
BUN: 33 mg/dL — ABNORMAL HIGH (ref 8–23)
CO2: 23 mmol/L (ref 22–32)
Calcium: 9 mg/dL (ref 8.9–10.3)
Chloride: 105 mmol/L (ref 98–111)
Creatinine, Ser: 2.77 mg/dL — ABNORMAL HIGH (ref 0.44–1.00)
GFR calc Af Amer: 20 mL/min — ABNORMAL LOW (ref >60)
GFR calc non Af Amer: 17 mL/min — ABNORMAL LOW (ref >60)
Glucose, Bld: 125 mg/dL — ABNORMAL HIGH (ref 70–99)
Potassium: 4.4 mmol/L (ref 3.5–5.1)
Sodium: 143 mmol/L (ref 135–145)
Total Bilirubin: 0.5 mg/dL (ref 0.3–1.2)
Total Protein: 6.9 g/dL (ref 6.5–8.1)

## 2018-07-12 NOTE — ED Triage Notes (Signed)
Pt brought in by ems for evaluation of generalized weakness. Recently dx with uti. Sister states she was just unable to walk like she normally does starting yesterday.

## 2018-07-12 NOTE — ED Notes (Signed)
Awaiting EMS transport home.

## 2018-07-12 NOTE — Discharge Instructions (Addendum)
Continue her antibiotic as directed until its finished.  Call her primary care provider tomorrow to arrange a follow-up appt for this week as she may benefit from physical therapy.  You will be notified if her urine culture is positive.  Return to ER for any worsening symptoms

## 2018-07-13 ENCOUNTER — Other Ambulatory Visit (HOSPITAL_COMMUNITY): Payer: Self-pay | Admitting: Urology

## 2018-07-13 ENCOUNTER — Other Ambulatory Visit: Payer: Self-pay | Admitting: Urology

## 2018-07-13 DIAGNOSIS — N201 Calculus of ureter: Secondary | ICD-10-CM

## 2018-07-13 NOTE — ED Provider Notes (Signed)
[facility] EMERGENCY DEPARTMENT Provider Note   CSN: [id number] Arrival date & time: 07/12/18  1222     History   Chief Complaint Chief Complaint  Patient presents with  . Weakness    HPI Rachel Morgan is a 68 y.o. female.     The history is provided by a caregiver. The history is limited by the condition of the patient.  Weakness    Rachel Morgan is a 68 y.o. female with hx of cerebral palsy, CKD, DM, HTN, and MR  presents to the Emergency Department with her sister who is her care giver.  Her sister reports generalized weakness and increasing difficulty with walking, decreased appetite, and malodorous urine.  She was here on 07/01/18 for flank pain and found to have a UTI.  Caregiver states that she is currently taking Bactrim.  Sister is concerned that the antibiotic is not helping and she is worried that her infection may be worsening.  Sister denies fever, vomiting, diarrhea, difficulty breathing, hematuria, or skin breakdown.  Sister endorses limited mobility at baseline, walks with assistance.      Past Medical History:  Diagnosis Date  . Anxiety   . Calculus of gallbladder   . Cerebral palsy ([place])   . Chronic kidney disease   . Diabetes mellitus   . Gall stones   . GERD (gastroesophageal reflux disease)   . High cholesterol   . History of kidney stones   . Hypertension   . Hypothyroidism   . Kidney stones   . MR (mental retardation)   . Pyelonephritis   . Sepsis [facility])     Patient Active Problem List   Diagnosis Date Noted  . Acute renal failure superimposed on stage 3 chronic kidney disease ([place]) 06/03/2018  . Acute on chronic diastolic CHF (congestive heart failure) ([place]) 06/03/2018  . Acute respiratory failure with hypoxia ([place]) 06/03/2018  . Hypokalemia 06/03/2018  . Morbid obesity with BMI of 50.0-59.9, adult ([place]) 06/03/2018  . Acute exacerbation of CHF (congestive heart failure) ([place]) 06/01/2018  . Hypoxia 06/01/2018  . Pneumonia 05/09/2018   . Acute respiratory failure ([place]) 05/07/2018  . Acute metabolic encephalopathy 60/63/0160  . Hypomagnesemia 04/21/2018  . UTI (urinary tract infection) 04/20/2018  . PICC (peripherally inserted central catheter) in place   . Urinary tract infection without hematuria   . Hydronephrosis with renal and ureteral calculus obstruction 01/21/2016  . Hyperkalemia 01/21/2016  . Palliative care encounter   . DNR (do not resuscitate) discussion   . Encephalopathy 10/17/2015  . Goals of care, counseling/discussion 10/17/2015  . Nephrolithiasis 09/27/2015  . Cerebral palsy ([place]) 09/27/2015  . Sepsis ([place]) 09/26/2015  . AKI (acute kidney injury) ([place]) 09/26/2015  . Fever   . Urinary tract infectious disease   . Dilated cbd, acquired 09/14/2015  . Hypothyroidism 09/14/2015  . Hypertension 09/14/2015  . Depression with anxiety 09/14/2015  . UPJ obstruction, acquired 09/14/2015  . CKD (chronic kidney disease), stage III ([place]) 09/14/2015  . GERD (gastroesophageal reflux disease) 09/14/2015  . Calculus of gallbladder without cholecystitis without obstruction   . Abdominal pain, right upper quadrant 02/11/2011  . Acute pyelonephritis 02/11/2011  . Agoraphobia 02/11/2011  . Acute renal failure ([place]) 02/11/2011  . Hyponatremia 02/11/2011  . Dehydration 02/11/2011    Past Surgical History:  Procedure Laterality Date  . CYSTOSCOPY W/ URETERAL STENT PLACEMENT Bilateral 09/26/2015   Procedure: CYSTOSCOPY WITH Bilateral  RETROGRADE PYELOGRAM/URETERAL STENT PLACEMENT;  Surgeon: [name], MD;  Location: [facility];  Service:  Urology;  Laterality: Bilateral;  . CYSTOSCOPY W/ URETERAL STENT PLACEMENT Right 04/22/2018   Procedure: CYSTOSCOPY WITH RETROGRADE PYELOGRAM/URETERAL STENT PLACEMENT;  Surgeon: Cleon Gustin, MD;  Location: [facility];  Service: Urology;  Laterality: Right;  . CYSTOSCOPY W/ URETERAL STENT REMOVAL Left 11/10/2015   Procedure: CYSTOSCOPY WITH STENT REMOVAL;  Surgeon: Cleon Gustin, MD;  Location: [facility];  Service: Urology;  Laterality: Left;  . CYSTOSCOPY WITH RETROGRADE PYELOGRAM, URETEROSCOPY AND STENT PLACEMENT Right 06/24/2018   Procedure: CYSTOSCOPY WITH RIGHT RETROGRADE PYELOGRAM, RIGHT URETEROSCOPY AND RIGHT URETERAL STENT EXCHANGE;  Surgeon: Cleon Gustin, MD;  Location: [facility];  Service: Urology;  Laterality: Right;  . CYSTOSCOPY WITH STENT PLACEMENT Right 01/21/2016   Procedure: CYSTOSCOPY WITH STENT PLACEMENT;  Surgeon: Franchot Gallo, MD;  Location: [facility];  Service: Urology;  Laterality: Right;  . CYSTOSCOPY/RETROGRADE/URETEROSCOPY/STONE EXTRACTION WITH BASKET Left 10/31/2015   Procedure: CYSTOSCOPY/ BILATERAL URETEROSCOPY/BILATERAL STONE EXTRACTION/ LEFT STENT PLACEMENT;  Surgeon: Irine Seal, MD;  Location: [facility];  Service: Urology;  Laterality: Left;  . CYSTOSCOPY/URETEROSCOPY/HOLMIUM LASER/STENT PLACEMENT Right 02/27/2016   Procedure: CYSTOSCOPY/RIGHT URETEROSCOPY/HOLMIUM LASER/STENT PLACEMENT/STENT REMOVAL;  Surgeon: Irine Seal, MD;  Location: [facility];  Service: Urology;  Laterality: Right;  . HOLMIUM LASER APPLICATION N/A 25/36/6440   Procedure: HOLMIUM LASER APPLICATION;  Surgeon: Irine Seal, MD;  Location: [facility];  Service: Urology;  Laterality: N/A;  . HOLMIUM LASER APPLICATION Right 3/47/4259   Procedure: HOLMIUM LASER APPLICATION;  Surgeon: Cleon Gustin, MD;  Location: [facility];  Service: Urology;  Laterality: Right;  . IR CV LINE INJECTION  05/06/2018  . IR FLUORO GUIDE PORT INSERTION RIGHT  04/10/2016  . IR US GUIDE VASC ACCESS RIGHT  04/10/2016     OB History   No obstetric history on file.      Home Medications    Prior to Admission medications   Medication Sig Start Date End Date Taking? Authorizing Provider  acetaminophen (TYLENOL) 325 MG tablet Take 2 tablets (650 mg total) by mouth every 6 (six) hours as needed for mild pain (or Fever >/= 101). 06/08/18  Yes Emokpae, Courage, MD  ALPRAZolam Duanne Moron) 1 MG tablet Take 1  tablet (1 mg total) by mouth 2 (two) times daily as needed for anxiety. 06/17/18  Yes Roxan Hockey, MD  aspirin EC 81 MG tablet Take 1 tablet (81 mg total) by mouth daily with breakfast. 06/08/18  Yes Emokpae, Courage, MD  blood glucose meter kit and supplies Relion Prime or Dispense other brand based on patient and insurance preference. Use up to four times daily as directed. (FOR ICD-9 250.00, 250.01).  DX--R 73.9 06/17/18  Yes Emokpae, Courage, MD  busPIRone (BUSPAR) 10 MG tablet Take 10 mg by mouth 2 (two) times daily.   Yes [provider]  chlorproMAZINE (THORAZINE) 25 MG tablet Take 1 tablet by mouth 2 (two) times daily as needed. 07/09/18  Yes [provider]  escitalopram (LEXAPRO) 20 MG tablet Take 1 tablet (20 mg total) by mouth daily. 06/17/18  Yes Emokpae, Courage, MD  glucose blood test strip Use as instructed--- Dx R73.9 06/17/18  Yes Emokpae, Courage, MD  levothyroxine (SYNTHROID, LEVOTHROID) 50 MCG tablet Take 50 mcg by mouth daily before breakfast.   Yes [provider]  midodrine (PROAMATINE) 10 MG tablet Take 1 tablet (10 mg total) by mouth 3 (three) times daily with meals. 06/17/18  Yes Emokpae, Courage, MD  OLANZapine (ZYPREXA) 20 MG tablet Take 20 mg by mouth at bedtime.   Yes  [provider]  omeprazole (PRILOSEC) 20 MG capsule Take 20 mg by mouth daily before breakfast.    Yes [provider]  oxybutynin (DITROPAN-XL) 10 MG 24 hr tablet Take 10 mg by mouth every morning.    Yes [provider]  potassium chloride SA (K-DUR) 20 MEQ tablet Take 2 tablets (40 mEq total) by mouth daily. 06/17/18  Yes Emokpae, Courage, MD  senna-docusate (SENOKOT-S) 8.6-50 MG tablet Take 1 tablet by mouth 2 (two) times daily. Patient taking differently: Take 1 tablet by mouth 2 (two) times daily as needed.  01/26/16  Yes Sheikh, Omair Latif, DO  sodium bicarbonate 650 MG tablet Take 1 tablet (650 mg total) by mouth daily. 06/08/18  Yes Emokpae,  Courage, MD  sulfamethoxazole-trimethoprim (BACTRIM) 400-80 MG tablet Take 1 tablet by mouth 2 (two) times a day. 07/06/18  Yes [provider]  torsemide (DEMADEX) 20 MG tablet Take 1 tablet (20 mg total) by mouth 2 (two) times daily. For Fluid 06/08/18  Yes Emokpae, Courage, MD  traMADol (ULTRAM) 50 MG tablet Take 1 tablet (50 mg total) by mouth every 6 (six) hours as needed. 06/24/18 06/24/19 Yes McKenzie, Candee Furbish, MD  zolpidem (AMBIEN) 5 MG tablet Take 1 tablet (5 mg total) by mouth at bedtime as needed for sleep. 06/17/18  Yes Roxan Hockey, MD    Family History Family History  Family history unknown: Yes    Social History Social History   Tobacco Use  . Smoking status: Never Smoker  . Smokeless tobacco: Never Used  Substance Use Topics  . Alcohol use: No  . Drug use: No     Allergies   Macrobid [nitrofurantoin monohyd macro] and Penicillins   Review of Systems Review of Systems  Unable to perform ROS: Patient nonverbal     Physical Exam Updated Vital Signs BP 128/82   Pulse (!) 57   Temp (!) 97.5 F (36.4 C) (Oral)   Resp 12   Ht 5' (1.524 m)   Wt 114 kg   SpO2 94%   BMI 49.08 kg/m   Physical Exam Vitals signs and nursing note reviewed.  Constitutional:      Appearance: She is obese. She is not toxic-appearing.  HENT:     Head: Normocephalic.     Mouth/Throat:     Mouth: Mucous membranes are moist.  Eyes:     Conjunctiva/sclera: Conjunctivae normal.     Pupils: Pupils are equal, round, and reactive to light.  Neck:     Musculoskeletal: Normal range of motion and neck supple.     Thyroid: No thyromegaly.  Cardiovascular:     Rate and Rhythm: Normal rate and regular rhythm.  Pulmonary:     Effort: Pulmonary effort is normal.     Breath sounds: Normal breath sounds. No wheezing.  Abdominal:     Palpations: Abdomen is soft.     Tenderness: There is no abdominal tenderness. There is no guarding or rebound.  Musculoskeletal: Normal range of  motion.     Right lower leg: No edema.     Left lower leg: No edema.  Skin:    General: Skin is warm.     Capillary Refill: Capillary refill takes less than 2 seconds.     Findings: No erythema or rash.  Neurological:     Mental Status: She is alert. Mental status is at baseline.     Sensory: No sensory deficit.      ED Treatments / Results  Labs (all labs  ordered are listed, but only abnormal results are displayed) Labs Reviewed  COMPREHENSIVE METABOLIC PANEL - Abnormal; Notable for the following components:      Result Value   Glucose, Bld 125 (*)    BUN 33 (*)    Creatinine, Ser 2.77 (*)    AST 13 (*)    GFR calc non Af Amer 17 (*)    GFR calc Af Amer 20 (*)    All other components within normal limits  CBC WITH DIFFERENTIAL/PLATELET - Abnormal; Notable for the following components:   Hemoglobin 11.2 (*)    MCHC 29.7 (*)    All other components within normal limits  URINALYSIS, ROUTINE W REFLEX MICROSCOPIC - Abnormal; Notable for the following components:   APPearance HAZY (*)    All other components within normal limits  NOVEL CORONAVIRUS, NAA (HOSPITAL ORDER, SEND-OUT TO REF LAB)  URINE CULTURE    EKG None  Radiology Dg Chest Portable 1 View  Result Date: 07/12/2018 CLINICAL DATA:  Generalized weakness.  Recent UTI. EXAM: PORTABLE CHEST 1 VIEW COMPARISON:  June 12, 2018 FINDINGS: The right Port-A-Cath is stable. No pneumothorax. The cardiomediastinal silhouette is stable. Low lung volumes limit evaluation. No focal infiltrate. Mild prominence of the interstitium. IMPRESSION: 1. Mild prominence of the interstitium may be due to the to the low volume portable technique. Mild pulmonary venous congestion not excluded. No other changes. Electronically Signed   By: Dorise Bullion III M.D   On: 07/12/2018 15:26    Procedures Procedures (including critical care time)  Medications Ordered in ED Medications - No data to display   Initial Impression / Assessment and  Plan / ED Course  I have reviewed the triage vital signs and the nursing notes.  Pertinent labs & imaging results that were available during my care of the patient were reviewed by me and considered in my medical decision making (see chart for details).         Previous medical records reviewed by me.  Pt currently taking Bactrim. She is non-toxic appearing.   Previous urine culture showed sensitivity to sulfa.  Labs today are reassuring.  No concerning sx's for urosepsis or focal neuro deficits.  Previous CT renal stone study showed mild right hydroureter and hydronephrosis w/o evidence of stone.  Sister agrees to continue abx and close f/u with PCP.  Pt appears appropriate for d/c home, strict return precautions discussed.    Final Clinical Impressions(s) / ED Diagnoses   Final diagnoses:  Generalized weakness    ED Discharge Orders    None       Kem Parkinson, PA-C 07/13/18 2214    Francine Graven, DO 07/16/18 [phone]

## 2018-07-14 LAB — NOVEL CORONAVIRUS, NAA (HOSP ORDER, SEND-OUT TO REF LAB; TAT 18-24 HRS): SARS-CoV-2, NAA: NOT DETECTED

## 2018-07-14 LAB — URINE CULTURE: Culture: NO GROWTH

## 2018-07-17 ENCOUNTER — Ambulatory Visit (HOSPITAL_COMMUNITY)
Admission: RE | Admit: 2018-07-17 | Discharge: 2018-07-17 | Disposition: A | Discharge status: Home / Self Care | Payer: Medicare Other | Source: Ambulatory Visit | Attending: Urology | Admitting: Urology

## 2018-07-17 ENCOUNTER — Other Ambulatory Visit: Payer: Self-pay

## 2018-07-17 DIAGNOSIS — N201 Calculus of ureter: Secondary | ICD-10-CM | POA: Diagnosis present

## 2018-07-21 ENCOUNTER — Other Ambulatory Visit (HOSPITAL_COMMUNITY): Payer: Self-pay | Admitting: Family

## 2018-07-21 DIAGNOSIS — R296 Repeated falls: Secondary | ICD-10-CM

## 2018-07-23 ENCOUNTER — Other Ambulatory Visit: Payer: Self-pay

## 2018-07-23 ENCOUNTER — Ambulatory Visit (HOSPITAL_COMMUNITY)
Admission: RE | Admit: 2018-07-23 | Discharge: 2018-07-23 | Disposition: A | Discharge status: Home / Self Care | Payer: Medicare Other | Source: Ambulatory Visit | Attending: Family | Admitting: Family

## 2018-07-23 DIAGNOSIS — R296 Repeated falls: Secondary | ICD-10-CM | POA: Diagnosis present

## 2018-07-31 ENCOUNTER — Other Ambulatory Visit: Payer: Self-pay

## 2018-07-31 ENCOUNTER — Emergency Department (HOSPITAL_COMMUNITY): Payer: Medicare Other

## 2018-07-31 ENCOUNTER — Encounter (HOSPITAL_COMMUNITY): Payer: Self-pay | Admitting: Emergency Medicine

## 2018-07-31 ENCOUNTER — Emergency Department (HOSPITAL_COMMUNITY)
Admission: EM | Admit: 2018-07-31 | Discharge: 2018-07-31 | Disposition: A | Discharge status: Home / Self Care | Payer: Medicare Other | Attending: Emergency Medicine | Admitting: Emergency Medicine

## 2018-07-31 DIAGNOSIS — G809 Cerebral palsy, unspecified: Secondary | ICD-10-CM | POA: Diagnosis not present

## 2018-07-31 DIAGNOSIS — N390 Urinary tract infection, site not specified: Secondary | ICD-10-CM | POA: Insufficient documentation

## 2018-07-31 DIAGNOSIS — Y9389 Activity, other specified: Secondary | ICD-10-CM | POA: Diagnosis not present

## 2018-07-31 DIAGNOSIS — Y999 Unspecified external cause status: Secondary | ICD-10-CM | POA: Diagnosis not present

## 2018-07-31 DIAGNOSIS — Y9289 Other specified places as the place of occurrence of the external cause: Secondary | ICD-10-CM | POA: Diagnosis not present

## 2018-07-31 DIAGNOSIS — R531 Weakness: Secondary | ICD-10-CM | POA: Diagnosis present

## 2018-07-31 DIAGNOSIS — I5032 Chronic diastolic (congestive) heart failure: Secondary | ICD-10-CM | POA: Diagnosis not present

## 2018-07-31 DIAGNOSIS — S8011XA Contusion of right lower leg, initial encounter: Secondary | ICD-10-CM | POA: Insufficient documentation

## 2018-07-31 DIAGNOSIS — I130 Hypertensive heart and chronic kidney disease with heart failure and stage 1 through stage 4 chronic kidney disease, or unspecified chronic kidney disease: Secondary | ICD-10-CM | POA: Diagnosis not present

## 2018-07-31 DIAGNOSIS — Z79899 Other long term (current) drug therapy: Secondary | ICD-10-CM | POA: Insufficient documentation

## 2018-07-31 DIAGNOSIS — F79 Unspecified intellectual disabilities: Secondary | ICD-10-CM | POA: Insufficient documentation

## 2018-07-31 DIAGNOSIS — E1122 Type 2 diabetes mellitus with diabetic chronic kidney disease: Secondary | ICD-10-CM | POA: Diagnosis not present

## 2018-07-31 DIAGNOSIS — M79661 Pain in right lower leg: Secondary | ICD-10-CM | POA: Insufficient documentation

## 2018-07-31 DIAGNOSIS — W19XXXA Unspecified fall, initial encounter: Secondary | ICD-10-CM | POA: Insufficient documentation

## 2018-07-31 DIAGNOSIS — N183 Chronic kidney disease, stage 3 (moderate): Secondary | ICD-10-CM | POA: Diagnosis not present

## 2018-07-31 LAB — COMPREHENSIVE METABOLIC PANEL
ALT: 12 U/L (ref 0–44)
AST: 14 U/L — ABNORMAL LOW (ref 15–41)
Albumin: 3.3 g/dL — ABNORMAL LOW (ref 3.5–5.0)
Alkaline Phosphatase: 89 U/L (ref 38–126)
Anion gap: 9 (ref 5–15)
BUN: 28 mg/dL — ABNORMAL HIGH (ref 8–23)
CO2: 28 mmol/L (ref 22–32)
Calcium: 8.8 mg/dL — ABNORMAL LOW (ref 8.9–10.3)
Chloride: 104 mmol/L (ref 98–111)
Creatinine, Ser: 1.92 mg/dL — ABNORMAL HIGH (ref 0.44–1.00)
GFR calc Af Amer: 31 mL/min — ABNORMAL LOW (ref >60)
GFR calc non Af Amer: 27 mL/min — ABNORMAL LOW (ref >60)
Glucose, Bld: 109 mg/dL — ABNORMAL HIGH (ref 70–99)
Potassium: 4.6 mmol/L (ref 3.5–5.1)
Sodium: 141 mmol/L (ref 135–145)
Total Bilirubin: 0.7 mg/dL (ref 0.3–1.2)
Total Protein: 6.4 g/dL — ABNORMAL LOW (ref 6.5–8.1)

## 2018-07-31 LAB — URINALYSIS, ROUTINE W REFLEX MICROSCOPIC
Bilirubin Urine: NEGATIVE
Glucose, UA: NEGATIVE mg/dL
Hgb urine dipstick: NEGATIVE
Ketones, ur: NEGATIVE mg/dL
Nitrite: NEGATIVE
Protein, ur: NEGATIVE mg/dL
Specific Gravity, Urine: 1.01 (ref 1.005–1.030)
pH: 5 (ref 5.0–8.0)

## 2018-07-31 LAB — CBC WITH DIFFERENTIAL/PLATELET
Abs Immature Granulocytes: 0.01 10*3/uL (ref 0.00–0.07)
Basophils Absolute: 0 10*3/uL (ref 0.0–0.1)
Basophils Relative: 0 %
Eosinophils Absolute: 0 10*3/uL (ref 0.0–0.5)
Eosinophils Relative: 0 %
HCT: 32.1 % — ABNORMAL LOW (ref 36.0–46.0)
Hemoglobin: 9.7 g/dL — ABNORMAL LOW (ref 12.0–15.0)
Immature Granulocytes: 0 %
Lymphocytes Relative: 27 %
Lymphs Abs: 1.3 10*3/uL (ref 0.7–4.0)
MCH: 28.8 pg (ref 26.0–34.0)
MCHC: 30.2 g/dL (ref 30.0–36.0)
MCV: 95.3 fL (ref 80.0–100.0)
Monocytes Absolute: 0.5 10*3/uL (ref 0.1–1.0)
Monocytes Relative: 10 %
Neutro Abs: 3 10*3/uL (ref 1.7–7.7)
Neutrophils Relative %: 63 %
Platelets: 199 10*3/uL (ref 150–400)
RBC: 3.37 MIL/uL — ABNORMAL LOW (ref 3.87–5.11)
RDW: 14.6 % (ref 11.5–15.5)
WBC: 4.7 10*3/uL (ref 4.0–10.5)
nRBC: 0 % (ref 0.0–0.2)

## 2018-07-31 MED ORDER — SULFAMETHOXAZOLE-TRIMETHOPRIM 800-160 MG PO TABS: 1.0000 | ORAL_TABLET | Freq: Two times a day (BID) | ORAL | 0 refills | Status: AC

## 2018-07-31 MED ORDER — SULFAMETHOXAZOLE-TRIMETHOPRIM 800-160 MG PO TABS
1.0000 | ORAL_TABLET | Freq: Once | ORAL | Status: AC
  Administered 2018-07-31: 1 via ORAL
  Filled 2018-07-31: qty 1

## 2018-07-31 NOTE — ED Provider Notes (Signed)
[facility] EMERGENCY DEPARTMENT Provider Note   CSN: [id number] Arrival date & time: 07/31/18  1541    History   Chief Complaint Chief Complaint  Patient presents with  . Recurrent UTI    HPI Rachel Morgan is a 68 y.o. female.     HPI Patient with history of cerebral palsy and is unable to contribute to history.  Sister is at bedside.  States patient has had increased generalized weakness and decreased mobility for the past 4 days.  She has been complaining of pain to her right lower extremity after falling.  Patient gets frequent urinary tract infections with similar symptoms. Past Medical History:  Diagnosis Date  . Anxiety   . Calculus of gallbladder   . Cerebral palsy ([place])   . Chronic kidney disease   . Diabetes mellitus   . Gall stones   . GERD (gastroesophageal reflux disease)   . High cholesterol   . History of kidney stones   . Hypertension   . Hypothyroidism   . Kidney stones   . MR (mental retardation)   . Pyelonephritis   . Sepsis [facility])     Patient Active Problem List   Diagnosis Date Noted  . Acute renal failure superimposed on stage 3 chronic kidney disease ([place]) 06/03/2018  . Acute on chronic diastolic CHF (congestive heart failure) ([place]) 06/03/2018  . Acute respiratory failure with hypoxia ([place]) 06/03/2018  . Hypokalemia 06/03/2018  . Morbid obesity with BMI of 50.0-59.9, adult ([place]) 06/03/2018  . Acute exacerbation of CHF (congestive heart failure) ([place]) 06/01/2018  . Hypoxia 06/01/2018  . Pneumonia 05/09/2018  . Acute respiratory failure ([place]) 05/07/2018  . Acute metabolic encephalopathy 88/50/2774  . Hypomagnesemia 04/21/2018  . UTI (urinary tract infection) 04/20/2018  . PICC (peripherally inserted central catheter) in place   . Urinary tract infection without hematuria   . Hydronephrosis with renal and ureteral calculus obstruction 01/21/2016  . Hyperkalemia 01/21/2016  . Palliative care encounter   . DNR (do not resuscitate)  discussion   . Encephalopathy 10/17/2015  . Goals of care, counseling/discussion 10/17/2015  . Nephrolithiasis 09/27/2015  . Cerebral palsy ([place]) 09/27/2015  . Sepsis ([place]) 09/26/2015  . AKI (acute kidney injury) ([place]) 09/26/2015  . Fever   . Urinary tract infectious disease   . Dilated cbd, acquired 09/14/2015  . Hypothyroidism 09/14/2015  . Hypertension 09/14/2015  . Depression with anxiety 09/14/2015  . UPJ obstruction, acquired 09/14/2015  . CKD (chronic kidney disease), stage III ([place]) 09/14/2015  . GERD (gastroesophageal reflux disease) 09/14/2015  . Calculus of gallbladder without cholecystitis without obstruction   . Abdominal pain, right upper quadrant 02/11/2011  . Acute pyelonephritis 02/11/2011  . Agoraphobia 02/11/2011  . Acute renal failure ([place]) 02/11/2011  . Hyponatremia 02/11/2011  . Dehydration 02/11/2011    Past Surgical History:  Procedure Laterality Date  . CYSTOSCOPY W/ URETERAL STENT PLACEMENT Bilateral 09/26/2015   Procedure: CYSTOSCOPY WITH Bilateral  RETROGRADE PYELOGRAM/URETERAL STENT PLACEMENT;  Surgeon: [name], MD;  Location: [facility];  Service: Urology;  Laterality: Bilateral;  . CYSTOSCOPY W/ URETERAL STENT PLACEMENT Right 04/22/2018   Procedure: CYSTOSCOPY WITH RETROGRADE PYELOGRAM/URETERAL STENT PLACEMENT;  Surgeon: Cleon Gustin, MD;  Location: [facility];  Service: Urology;  Laterality: Right;  . CYSTOSCOPY W/ URETERAL STENT REMOVAL Left 11/10/2015   Procedure: CYSTOSCOPY WITH STENT REMOVAL;  Surgeon: Cleon Gustin, MD;  Location: [facility];  Service: Urology;  Laterality: Left;  . CYSTOSCOPY WITH RETROGRADE PYELOGRAM, URETEROSCOPY AND STENT PLACEMENT Right 06/24/2018  Procedure: CYSTOSCOPY WITH RIGHT RETROGRADE PYELOGRAM, RIGHT URETEROSCOPY AND RIGHT URETERAL STENT EXCHANGE;  Surgeon: Cleon Gustin, MD;  Location: [facility];  Service: Urology;  Laterality: Right;  . CYSTOSCOPY WITH STENT PLACEMENT Right 01/21/2016   Procedure:  CYSTOSCOPY WITH STENT PLACEMENT;  Surgeon: Franchot Gallo, MD;  Location: [facility];  Service: Urology;  Laterality: Right;  . CYSTOSCOPY/RETROGRADE/URETEROSCOPY/STONE EXTRACTION WITH BASKET Left 10/31/2015   Procedure: CYSTOSCOPY/ BILATERAL URETEROSCOPY/BILATERAL STONE EXTRACTION/ LEFT STENT PLACEMENT;  Surgeon: Irine Seal, MD;  Location: [facility];  Service: Urology;  Laterality: Left;  . CYSTOSCOPY/URETEROSCOPY/HOLMIUM LASER/STENT PLACEMENT Right 02/27/2016   Procedure: CYSTOSCOPY/RIGHT URETEROSCOPY/HOLMIUM LASER/STENT PLACEMENT/STENT REMOVAL;  Surgeon: Irine Seal, MD;  Location: [facility];  Service: Urology;  Laterality: Right;  . HOLMIUM LASER APPLICATION N/A 75/44/9201   Procedure: HOLMIUM LASER APPLICATION;  Surgeon: Irine Seal, MD;  Location: [facility];  Service: Urology;  Laterality: N/A;  . HOLMIUM LASER APPLICATION Right 0/07/1217   Procedure: HOLMIUM LASER APPLICATION;  Surgeon: Cleon Gustin, MD;  Location: [facility];  Service: Urology;  Laterality: Right;  . IR CV LINE INJECTION  05/06/2018  . IR FLUORO GUIDE PORT INSERTION RIGHT  04/10/2016  . IR US GUIDE VASC ACCESS RIGHT  04/10/2016     OB History   No obstetric history on file.      Home Medications    Prior to Admission medications   Medication Sig Start Date End Date Taking? Authorizing Provider  acetaminophen (TYLENOL) 325 MG tablet Take 2 tablets (650 mg total) by mouth every 6 (six) hours as needed for mild pain (or Fever >/= 101). 06/08/18   Roxan Hockey, MD  ALPRAZolam Duanne Moron) 1 MG tablet Take 1 tablet (1 mg total) by mouth 2 (two) times daily as needed for anxiety. 06/17/18   Roxan Hockey, MD  aspirin EC 81 MG tablet Take 1 tablet (81 mg total) by mouth daily with breakfast. 06/08/18   Roxan Hockey, MD  blood glucose meter kit and supplies Relion Prime or Dispense other brand based on patient and insurance preference. Use up to four times daily as directed. (FOR ICD-9 250.00, 250.01).  DX--R 73.9 06/17/18   Emokpae,  Courage, MD  busPIRone (BUSPAR) 10 MG tablet Take 10 mg by mouth 2 (two) times daily.    [provider]  chlorproMAZINE (THORAZINE) 25 MG tablet Take 1 tablet by mouth 2 (two) times daily as needed. 07/09/18   [provider]  escitalopram (LEXAPRO) 20 MG tablet Take 1 tablet (20 mg total) by mouth daily. 06/17/18   Roxan Hockey, MD  glucose blood test strip Use as instructed--- Dx R73.9 06/17/18   Roxan Hockey, MD  levothyroxine (SYNTHROID, LEVOTHROID) 50 MCG tablet Take 50 mcg by mouth daily before breakfast.    [provider]  midodrine (PROAMATINE) 10 MG tablet Take 1 tablet (10 mg total) by mouth 3 (three) times daily with meals. 06/17/18   Emokpae, Courage, MD  OLANZapine (ZYPREXA) 20 MG tablet Take 20 mg by mouth at bedtime.    [provider]  omeprazole (PRILOSEC) 20 MG capsule Take 20 mg by mouth daily before breakfast.     [provider]  oxybutynin (DITROPAN-XL) 10 MG 24 hr tablet Take 10 mg by mouth every morning.     [provider]  potassium chloride SA (K-DUR) 20 MEQ tablet Take 2 tablets (40 mEq total) by mouth daily. 06/17/18   Emokpae, Courage, MD  senna-docusate (SENOKOT-S) 8.6-50 MG tablet Take 1 tablet by mouth 2 (two) times daily.  Patient taking differently: Take 1 tablet by mouth 2 (two) times daily as needed.  01/26/16   Raiford Noble Latif, DO  sodium bicarbonate 650 MG tablet Take 1 tablet (650 mg total) by mouth daily. 06/08/18   Roxan Hockey, MD  sulfamethoxazole-trimethoprim (BACTRIM DS) 800-160 MG tablet Take 1 tablet by mouth 2 (two) times daily for 7 days. 08/01/18 08/08/18  Julianne Rice, MD  torsemide (DEMADEX) 20 MG tablet Take 1 tablet (20 mg total) by mouth 2 (two) times daily. For Fluid 06/08/18   Roxan Hockey, MD  traMADol (ULTRAM) 50 MG tablet Take 1 tablet (50 mg total) by mouth every 6 (six) hours as needed. 06/24/18 06/24/19  McKenzie, Candee Furbish, MD  zolpidem (AMBIEN) 5 MG tablet Take 1 tablet (5  mg total) by mouth at bedtime as needed for sleep. 06/17/18   Roxan Hockey, MD    Family History Family History  Family history unknown: Yes    Social History Social History   Tobacco Use  . Smoking status: Never Smoker  . Smokeless tobacco: Never Used  Substance Use Topics  . Alcohol use: No  . Drug use: No     Allergies   Macrobid [nitrofurantoin monohyd macro] and Penicillins   Review of Systems Review of Systems  Unable to perform ROS: Mental status change     Physical Exam Updated Vital Signs BP (!) 110/50   Pulse (!) 50   Temp (!) 97.5 F (36.4 C) (Oral)   Resp 15   Ht 5' (1.524 m)   Wt (!) 149.7 kg   SpO2 98%   BMI 64.45 kg/m   Physical Exam Vitals signs and nursing note reviewed.  Constitutional:      Appearance: Normal appearance. She is well-developed.  HENT:     Head: Normocephalic and atraumatic.     Nose: Nose normal.     Mouth/Throat:     Mouth: Mucous membranes are moist.  Eyes:     Pupils: Pupils are equal, round, and reactive to light.  Neck:     Musculoskeletal: Normal range of motion and neck supple. No neck rigidity or muscular tenderness.     Vascular: No carotid bruit.  Cardiovascular:     Rate and Rhythm: Normal rate and regular rhythm.  Pulmonary:     Effort: Pulmonary effort is normal. No respiratory distress.     Breath sounds: Normal breath sounds. No stridor. No wheezing, rhonchi or rales.  Chest:     Chest wall: No tenderness.  Abdominal:     General: Bowel sounds are normal.     Palpations: Abdomen is soft.     Tenderness: There is no abdominal tenderness. There is no guarding or rebound.  Musculoskeletal: Normal range of motion.        General: No tenderness.     Comments: Patient with some bruising to the pretibial area of the right lower extremity.  No definite joints effusions, deformity.  Bilateral lower extremities with symmetric swelling.  Skin:    General: Skin is warm and dry.     Findings: No erythema  or rash.  Neurological:     Mental Status: She is alert.     Comments: Follows simple commands.  Moving all extremities without focal deficit.  Limited mobility throughout.  Psychiatric:        Behavior: Behavior normal.      ED Treatments / Results  Labs (all labs ordered are listed, but only abnormal results are displayed) Labs Reviewed  CBC WITH  DIFFERENTIAL/PLATELET - Abnormal; Notable for the following components:      Result Value   RBC 3.37 (*)    Hemoglobin 9.7 (*)    HCT 32.1 (*)    All other components within normal limits  COMPREHENSIVE METABOLIC PANEL - Abnormal; Notable for the following components:   Glucose, Bld 109 (*)    BUN 28 (*)    Creatinine, Ser 1.92 (*)    Calcium 8.8 (*)    Total Protein 6.4 (*)    Albumin 3.3 (*)    AST 14 (*)    GFR calc non Af Amer 27 (*)    GFR calc Af Amer 31 (*)    All other components within normal limits  URINALYSIS, ROUTINE W REFLEX MICROSCOPIC - Abnormal; Notable for the following components:   APPearance HAZY (*)    Leukocytes,Ua MODERATE (*)    Bacteria, UA FEW (*)    All other components within normal limits  URINE CULTURE    EKG None  Radiology Dg Tibia/fibula Right  Result Date: 07/31/2018 CLINICAL DATA:  RIGHT leg pain post fall EXAM: RIGHT TIBIA AND FIBULA - 2 VIEW COMPARISON:  None Correlation: RIGHT ankle radiographs 06/01/2018 FINDINGS: Osseous demineralization. Knee and ankle joint alignments normal. Non fused ossicle at tip of lateral malleolus unchanged. No acute fracture, dislocation, or bone destruction. IMPRESSION: No acute osseous abnormalities. Electronically Signed   By: Lavonia Dana M.D.   On: 07/31/2018 18:13    Procedures Procedures (including critical care time)  Medications Ordered in ED Medications  sulfamethoxazole-trimethoprim (BACTRIM DS) 800-160 MG per tablet 1 tablet (1 tablet Oral Given 07/31/18 1746)     Initial Impression / Assessment and Plan / ED Course  I have reviewed the  triage vital signs and the nursing notes.  Pertinent labs & imaging results that were available during my care of the patient were reviewed by me and considered in my medical decision making (see chart for details).       Patient with evidence of UTI.  Urine culture in the past has grown out E. coli sensitive to Bactrim.  Given first dose in the emergency department.  Return precautions given.   Final Clinical Impressions(s) / ED Diagnoses   Final diagnoses:  Urinary tract infection without hematuria, site unspecified  Contusion of multiple sites of right lower extremity, initial encounter    ED Discharge Orders         Ordered    sulfamethoxazole-trimethoprim (BACTRIM DS) 800-160 MG tablet  2 times daily     07/31/18 1926           Julianne Rice, MD 07/31/18 2205

## 2018-07-31 NOTE — ED Triage Notes (Signed)
Brought in by [facility].  Family concerned about recurrent UTI.  CBG 112, SR 60's, Sats 96-100% on RA. And VSS.  Pt has MR and sister at bedside.

## 2018-08-02 LAB — URINE CULTURE: Culture: 60000 — AB

## 2018-08-03 ENCOUNTER — Telehealth: Payer: Self-pay | Admitting: Emergency Medicine

## 2018-08-03 NOTE — Telephone Encounter (Signed)
Post ED Visit - Positive Culture Follow-up  Culture report reviewed by antimicrobial stewardship pharmacist: [facility] Team []  Elenor Quinones, Pharm.D. []  Heide Guile, Pharm.D., BCPS AQ-ID []  Parks Neptune, Pharm.D., BCPS []  Alycia Rossetti, Pharm.D., BCPS []  Echo, Pharm.D., BCPS, AAHIVP []  Legrand Como, Pharm.D., BCPS, AAHIVP []  Salome Arnt, PharmD, BCPS []  Johnnette Gourd, PharmD, BCPS []  Hughes Better, PharmD, BCPS []  Leeroy Cha, PharmD []  Laqueta Linden, PharmD, BCPS []  Albertina Parr, PharmD Elicia Lamp PharmD  [place] Team []  Leodis Sias, PharmD []  Lindell Spar, PharmD []  Royetta Asal, PharmD []  Graylin Shiver, Rph []  Rema Fendt) Glennon Mac, PharmD []  Arlyn Dunning, PharmD []  Netta Cedars, PharmD []  Dia Sitter, PharmD []  Leone Haven, PharmD []  Gretta Arab, PharmD []  Theodis Shove, PharmD []  Peggyann Juba, PharmD []  Reuel Boom, PharmD   Positive urine culture Treated with sulfamethoxazole-trimethoprim, organism sensitive to the same and no further patient follow-up is required at this time.  Hazle Nordmann 08/03/2018, 10:38 AM

## 2018-08-05 ENCOUNTER — Ambulatory Visit: Payer: Medicare Other | Admitting: Urology

## 2018-08-05 ENCOUNTER — Ambulatory Visit (INDEPENDENT_AMBULATORY_CARE_PROVIDER_SITE_OTHER): Payer: Medicare Other | Admitting: Urology

## 2018-08-05 DIAGNOSIS — N201 Calculus of ureter: Secondary | ICD-10-CM

## 2018-10-14 ENCOUNTER — Emergency Department (HOSPITAL_COMMUNITY): Payer: Medicare Other

## 2018-10-14 ENCOUNTER — Encounter (HOSPITAL_COMMUNITY): Payer: Self-pay

## 2018-10-14 ENCOUNTER — Emergency Department (HOSPITAL_COMMUNITY)
Admission: EM | Admit: 2018-10-14 | Discharge: 2018-10-14 | Disposition: A | Discharge status: Home / Self Care | Payer: Medicare Other | Attending: Emergency Medicine | Admitting: Emergency Medicine

## 2018-10-14 ENCOUNTER — Other Ambulatory Visit: Payer: Self-pay

## 2018-10-14 DIAGNOSIS — I5032 Chronic diastolic (congestive) heart failure: Secondary | ICD-10-CM | POA: Insufficient documentation

## 2018-10-14 DIAGNOSIS — E1122 Type 2 diabetes mellitus with diabetic chronic kidney disease: Secondary | ICD-10-CM | POA: Diagnosis not present

## 2018-10-14 DIAGNOSIS — F0390 Unspecified dementia without behavioral disturbance: Secondary | ICD-10-CM | POA: Insufficient documentation

## 2018-10-14 DIAGNOSIS — I130 Hypertensive heart and chronic kidney disease with heart failure and stage 1 through stage 4 chronic kidney disease, or unspecified chronic kidney disease: Secondary | ICD-10-CM | POA: Diagnosis not present

## 2018-10-14 DIAGNOSIS — N390 Urinary tract infection, site not specified: Secondary | ICD-10-CM | POA: Diagnosis not present

## 2018-10-14 DIAGNOSIS — R1031 Right lower quadrant pain: Secondary | ICD-10-CM | POA: Insufficient documentation

## 2018-10-14 DIAGNOSIS — G809 Cerebral palsy, unspecified: Secondary | ICD-10-CM | POA: Insufficient documentation

## 2018-10-14 DIAGNOSIS — E039 Hypothyroidism, unspecified: Secondary | ICD-10-CM | POA: Diagnosis not present

## 2018-10-14 DIAGNOSIS — R339 Retention of urine, unspecified: Secondary | ICD-10-CM | POA: Diagnosis present

## 2018-10-14 DIAGNOSIS — N183 Chronic kidney disease, stage 3 unspecified: Secondary | ICD-10-CM | POA: Diagnosis not present

## 2018-10-14 LAB — URINALYSIS, ROUTINE W REFLEX MICROSCOPIC
Bilirubin Urine: NEGATIVE
Glucose, UA: NEGATIVE mg/dL
Hgb urine dipstick: NEGATIVE
Ketones, ur: NEGATIVE mg/dL
Nitrite: NEGATIVE
Protein, ur: 30 mg/dL — AB
Specific Gravity, Urine: 1.012 (ref 1.005–1.030)
WBC, UA: >50 WBC/hpf — ABNORMAL HIGH (ref 0–5)
pH: 5 (ref 5.0–8.0)

## 2018-10-14 LAB — COMPREHENSIVE METABOLIC PANEL
ALT: 11 U/L (ref 0–44)
AST: 15 U/L (ref 15–41)
Albumin: 3.4 g/dL — ABNORMAL LOW (ref 3.5–5.0)
Alkaline Phosphatase: 73 U/L (ref 38–126)
Anion gap: 10 (ref 5–15)
BUN: 35 mg/dL — ABNORMAL HIGH (ref 8–23)
CO2: 23 mmol/L (ref 22–32)
Calcium: 8.1 mg/dL — ABNORMAL LOW (ref 8.9–10.3)
Chloride: 106 mmol/L (ref 98–111)
Creatinine, Ser: 1.86 mg/dL — ABNORMAL HIGH (ref 0.44–1.00)
GFR calc Af Amer: 32 mL/min — ABNORMAL LOW (ref >60)
GFR calc non Af Amer: 28 mL/min — ABNORMAL LOW (ref >60)
Glucose, Bld: 132 mg/dL — ABNORMAL HIGH (ref 70–99)
Potassium: 3.6 mmol/L (ref 3.5–5.1)
Sodium: 139 mmol/L (ref 135–145)
Total Bilirubin: 0.1 mg/dL — ABNORMAL LOW (ref 0.3–1.2)
Total Protein: 6.5 g/dL (ref 6.5–8.1)

## 2018-10-14 LAB — CBC WITH DIFFERENTIAL/PLATELET
Abs Immature Granulocytes: 0.02 10*3/uL (ref 0.00–0.07)
Basophils Absolute: 0 10*3/uL (ref 0.0–0.1)
Basophils Relative: 0 %
Eosinophils Absolute: 0.1 10*3/uL (ref 0.0–0.5)
Eosinophils Relative: 1 %
HCT: 33.6 % — ABNORMAL LOW (ref 36.0–46.0)
Hemoglobin: 10.3 g/dL — ABNORMAL LOW (ref 12.0–15.0)
Immature Granulocytes: 0 %
Lymphocytes Relative: 21 %
Lymphs Abs: 1.3 10*3/uL (ref 0.7–4.0)
MCH: 29.1 pg (ref 26.0–34.0)
MCHC: 30.7 g/dL (ref 30.0–36.0)
MCV: 94.9 fL (ref 80.0–100.0)
Monocytes Absolute: 0.5 10*3/uL (ref 0.1–1.0)
Monocytes Relative: 7 %
Neutro Abs: 4.5 10*3/uL (ref 1.7–7.7)
Neutrophils Relative %: 71 %
Platelets: 189 10*3/uL (ref 150–400)
RBC: 3.54 MIL/uL — ABNORMAL LOW (ref 3.87–5.11)
RDW: 13.9 % (ref 11.5–15.5)
WBC: 6.4 10*3/uL (ref 4.0–10.5)
nRBC: 0 % (ref 0.0–0.2)

## 2018-10-14 MED ORDER — HEPARIN SOD (PORK) LOCK FLUSH 100 UNIT/ML IV SOLN
500.0000 [IU] | Freq: Once | INTRAVENOUS | Status: AC
  Administered 2018-10-14: 20:00:00 500 [IU]
  Filled 2018-10-14: qty 5

## 2018-10-14 MED ORDER — CEPHALEXIN 500 MG PO CAPS: 500.0000 mg | ORAL_CAPSULE | Freq: Three times a day (TID) | ORAL | 0 refills | Status: DC

## 2018-10-14 MED ORDER — CEPHALEXIN 500 MG PO CAPS
500.0000 mg | ORAL_CAPSULE | Freq: Once | ORAL | Status: AC
  Administered 2018-10-14: 20:00:00 500 mg via ORAL
  Filled 2018-10-14: qty 1

## 2018-10-14 NOTE — ED Provider Notes (Signed)
[facility] EMERGENCY DEPARTMENT Provider Note   CSN: [id number] Arrival date & time: 10/14/18  1513     History   Chief Complaint Chief Complaint  Patient presents with  . Urinary Retention    HPI Rachel Morgan is a 68 y.o. female.     HPI Patient is a poor historian and has history of cerebral palsy.  Most of her history comes from her sister who is at bedside.  Level 5 caveat applies.  Per sister patient has had fluctuating blood pressure over the last few days which she states has been a sign of urinary tract infections in the past.  Patient also has been less focused over that time.  She called her primary physician about prescribing antibiotic but did not receive a call back.  Per patient sister, patient has chronic right flank pain. Past Medical History:  Diagnosis Date  . Anxiety   . Calculus of gallbladder   . Cerebral palsy ([place])   . Chronic kidney disease   . Diabetes mellitus   . Gall stones   . GERD (gastroesophageal reflux disease)   . High cholesterol   . History of kidney stones   . Hypertension   . Hypothyroidism   . Kidney stones   . MR (mental retardation)   . Pyelonephritis   . Sepsis [facility])     Patient Active Problem List   Diagnosis Date Noted  . Acute renal failure superimposed on stage 3 chronic kidney disease ([place]) 06/03/2018  . Acute on chronic diastolic CHF (congestive heart failure) ([place]) 06/03/2018  . Acute respiratory failure with hypoxia ([place]) 06/03/2018  . Hypokalemia 06/03/2018  . Morbid obesity with BMI of 50.0-59.9, adult ([place]) 06/03/2018  . Acute exacerbation of CHF (congestive heart failure) ([place]) 06/01/2018  . Hypoxia 06/01/2018  . Pneumonia 05/09/2018  . Acute respiratory failure ([place]) 05/07/2018  . Acute metabolic encephalopathy 33/35/4562  . Hypomagnesemia 04/21/2018  . UTI (urinary tract infection) 04/20/2018  . PICC (peripherally inserted central catheter) in place   . Urinary tract infection without hematuria   .  Hydronephrosis with renal and ureteral calculus obstruction 01/21/2016  . Hyperkalemia 01/21/2016  . Palliative care encounter   . DNR (do not resuscitate) discussion   . Encephalopathy 10/17/2015  . Goals of care, counseling/discussion 10/17/2015  . Nephrolithiasis 09/27/2015  . Cerebral palsy ([place]) 09/27/2015  . Sepsis ([place]) 09/26/2015  . AKI (acute kidney injury) ([place]) 09/26/2015  . Fever   . Urinary tract infectious disease   . Dilated cbd, acquired 09/14/2015  . Hypothyroidism 09/14/2015  . Hypertension 09/14/2015  . Depression with anxiety 09/14/2015  . UPJ obstruction, acquired 09/14/2015  . CKD (chronic kidney disease), stage III ([place]) 09/14/2015  . GERD (gastroesophageal reflux disease) 09/14/2015  . Calculus of gallbladder without cholecystitis without obstruction   . Abdominal pain, right upper quadrant 02/11/2011  . Acute pyelonephritis 02/11/2011  . Agoraphobia 02/11/2011  . Acute renal failure ([place]) 02/11/2011  . Hyponatremia 02/11/2011  . Dehydration 02/11/2011    Past Surgical History:  Procedure Laterality Date  . CYSTOSCOPY W/ URETERAL STENT PLACEMENT Bilateral 09/26/2015   Procedure: CYSTOSCOPY WITH Bilateral  RETROGRADE PYELOGRAM/URETERAL STENT PLACEMENT;  Surgeon: [name], MD;  Location: [facility];  Service: Urology;  Laterality: Bilateral;  . CYSTOSCOPY W/ URETERAL STENT PLACEMENT Right 04/22/2018   Procedure: CYSTOSCOPY WITH RETROGRADE PYELOGRAM/URETERAL STENT PLACEMENT;  Surgeon: Cleon Gustin, MD;  Location: [facility];  Service: Urology;  Laterality: Right;  . CYSTOSCOPY W/ URETERAL STENT REMOVAL Left  11/10/2015   Procedure: CYSTOSCOPY WITH STENT REMOVAL;  Surgeon: Cleon Gustin, MD;  Location: [facility];  Service: Urology;  Laterality: Left;  . CYSTOSCOPY WITH RETROGRADE PYELOGRAM, URETEROSCOPY AND STENT PLACEMENT Right 06/24/2018   Procedure: CYSTOSCOPY WITH RIGHT RETROGRADE PYELOGRAM, RIGHT URETEROSCOPY AND RIGHT URETERAL STENT EXCHANGE;  Surgeon:  Cleon Gustin, MD;  Location: [facility];  Service: Urology;  Laterality: Right;  . CYSTOSCOPY WITH STENT PLACEMENT Right 01/21/2016   Procedure: CYSTOSCOPY WITH STENT PLACEMENT;  Surgeon: Franchot Gallo, MD;  Location: [facility];  Service: Urology;  Laterality: Right;  . CYSTOSCOPY/RETROGRADE/URETEROSCOPY/STONE EXTRACTION WITH BASKET Left 10/31/2015   Procedure: CYSTOSCOPY/ BILATERAL URETEROSCOPY/BILATERAL STONE EXTRACTION/ LEFT STENT PLACEMENT;  Surgeon: Irine Seal, MD;  Location: [facility];  Service: Urology;  Laterality: Left;  . CYSTOSCOPY/URETEROSCOPY/HOLMIUM LASER/STENT PLACEMENT Right 02/27/2016   Procedure: CYSTOSCOPY/RIGHT URETEROSCOPY/HOLMIUM LASER/STENT PLACEMENT/STENT REMOVAL;  Surgeon: Irine Seal, MD;  [facility]: [facility];  Service: Urology;  Laterality: Right;  . HOLMIUM LASER APPLICATION N/A 56/31/4970   Procedure: HOLMIUM LASER APPLICATION;  Surgeon: Irine Seal, MD;  [facility]: [facility];  Service: Urology;  Laterality: N/A;  . HOLMIUM LASER APPLICATION Right 2/63/7858   Procedure: HOLMIUM LASER APPLICATION;  Surgeon: Cleon Gustin, MD;  Location: [facility];  Service: Urology;  Laterality: Right;  . IR CV LINE INJECTION  05/06/2018  . IR FLUORO GUIDE PORT INSERTION RIGHT  04/10/2016  . IR US GUIDE VASC ACCESS RIGHT  04/10/2016     OB History   No obstetric history on file.      Home Medications    Prior to Admission medications   Medication Sig Start Date End Date Taking? Authorizing Provider  acetaminophen (TYLENOL) 325 MG tablet Take 2 tablets (650 mg total) by mouth every 6 (six) hours as needed for mild pain (or Fever >/= 101). 06/08/18  Yes Emokpae, Courage, MD  ALPRAZolam Duanne Moron) 1 MG tablet Take 1 tablet (1 mg total) by mouth 2 (two) times daily as needed for anxiety. 06/17/18  Yes Roxan Hockey, MD  aspirin EC 81 MG tablet Take 1 tablet (81 mg total) by mouth daily with breakfast. 06/08/18  Yes Emokpae, Courage, MD  busPIRone (BUSPAR) 10 MG tablet Take 10 mg by mouth 2 (two)  times daily.   Yes [provider]  chlorproMAZINE (THORAZINE) 25 MG tablet Take 1 tablet by mouth 2 (two) times daily as needed. 07/09/18  Yes [provider]  escitalopram (LEXAPRO) 20 MG tablet Take 1 tablet (20 mg total) by mouth daily. 06/17/18  Yes Emokpae, Courage, MD  levothyroxine (SYNTHROID, LEVOTHROID) 50 MCG tablet Take 50 mcg by mouth daily before breakfast.   Yes [provider]  midodrine (PROAMATINE) 10 MG tablet Take 1 tablet (10 mg total) by mouth 3 (three) times daily with meals. 06/17/18  Yes Emokpae, Courage, MD  OLANZapine (ZYPREXA) 20 MG tablet Take 20 mg by mouth at bedtime.   Yes [provider]  omeprazole (PRILOSEC) 20 MG capsule Take 20 mg by mouth daily before breakfast.    Yes [provider]  oxybutynin (DITROPAN-XL) 10 MG 24 hr tablet Take 10 mg by mouth every morning.    Yes [provider]  potassium chloride SA (K-DUR) 20 MEQ tablet Take 2 tablets (40 mEq total) by mouth daily. 06/17/18  Yes Emokpae, Courage, MD  senna-docusate (SENOKOT-S) 8.6-50 MG tablet Take 1 tablet by mouth 2 (two) times daily. Patient taking differently: Take 1 tablet by mouth 2 (two) times daily as needed.  01/26/16  Yes  Sheikh, Omair Latif, DO  sodium bicarbonate 650 MG tablet Take 1 tablet (650 mg total) by mouth daily. 06/08/18  Yes Emokpae, Courage, MD  torsemide (DEMADEX) 20 MG tablet Take 1 tablet (20 mg total) by mouth 2 (two) times daily. For Fluid 06/08/18  Yes Emokpae, Courage, MD  zolpidem (AMBIEN) 5 MG tablet Take 1 tablet (5 mg total) by mouth at bedtime as needed for sleep. Patient taking differently: Take 5 mg by mouth at bedtime.  06/17/18  Yes Roxan Hockey, MD  blood glucose meter kit and supplies Relion Prime or Dispense other brand based on patient and insurance preference. Use up to four times daily as directed. (FOR ICD-9 250.00, 250.01).  DX--R 73.9 Patient not taking: Reported on 10/14/2018 06/17/18   Roxan Hockey, MD   cephALEXin (KEFLEX) 500 MG capsule Take 1 capsule (500 mg total) by mouth 3 (three) times daily. 10/15/18   Julianne Rice, MD  glucose blood test strip Use as instructed--- Dx R73.9 Patient not taking: Reported on 10/14/2018 06/17/18   Roxan Hockey, MD  traMADol (ULTRAM) 50 MG tablet Take 1 tablet (50 mg total) by mouth every 6 (six) hours as needed. Patient not taking: Reported on 10/14/2018 06/24/18 06/24/19  Cleon Gustin, MD    Family History Family History  Family history unknown: Yes    Social History Social History   Tobacco Use  . Smoking status: Never Smoker  . Smokeless tobacco: Never Used  Substance Use Topics  . Alcohol use: No  . Drug use: No     Allergies   Macrobid [nitrofurantoin monohyd macro] and Penicillins   Review of Systems Review of Systems  Unable to perform ROS: Dementia     Physical Exam Updated Vital Signs BP (!) 101/54   Pulse 60   Temp (!) 97.4 F (36.3 C) (Oral)   Resp 16   SpO2 96%   Physical Exam Vitals signs and nursing note reviewed.  Constitutional:      General: She is not in acute distress.    Appearance: She is well-developed. She is obese. She is not ill-appearing.  HENT:     Head: Normocephalic and atraumatic.     Mouth/Throat:     Mouth: Mucous membranes are moist.  Eyes:     Pupils: Pupils are equal, round, and reactive to light.  Neck:     Musculoskeletal: Normal range of motion and neck supple. No neck rigidity or muscular tenderness.  Cardiovascular:     Rate and Rhythm: Normal rate and regular rhythm.  Pulmonary:     Effort: Pulmonary effort is normal.     Breath sounds: Normal breath sounds.  Abdominal:     General: Bowel sounds are normal.     Palpations: Abdomen is soft.     Tenderness: There is no abdominal tenderness. There is no guarding or rebound.  Musculoskeletal: Normal range of motion.        General: No swelling, tenderness, deformity or signs of injury.     Right lower leg: No edema.      Left lower leg: No edema.  Lymphadenopathy:     Cervical: No cervical adenopathy.  Skin:    General: Skin is warm and dry.     Findings: No erythema or rash.  Neurological:     General: No focal deficit present.     Mental Status: She is alert.     Comments: Patient follows simple commands.  No lateralizing deficits.  Psychiatric:  Behavior: Behavior normal.      ED Treatments / Results  Labs (all labs ordered are listed, but only abnormal results are displayed) Labs Reviewed  URINALYSIS, ROUTINE W REFLEX MICROSCOPIC - Abnormal; Notable for the following components:      Result Value   APPearance TURBID (*)    Protein, ur 30 (*)    Leukocytes,Ua MODERATE (*)    WBC, UA >50 (*)    Bacteria, UA RARE (*)    All other components within normal limits  CBC WITH DIFFERENTIAL/PLATELET - Abnormal; Notable for the following components:   RBC 3.54 (*)    Hemoglobin 10.3 (*)    HCT 33.6 (*)    All other components within normal limits  COMPREHENSIVE METABOLIC PANEL - Abnormal; Notable for the following components:   Glucose, Bld 132 (*)    BUN 35 (*)    Creatinine, Ser 1.86 (*)    Calcium 8.1 (*)    Albumin 3.4 (*)    Total Bilirubin 0.1 (*)    GFR calc non Af Amer 28 (*)    GFR calc Af Amer 32 (*)    All other components within normal limits  URINE CULTURE    EKG None  Radiology Ct Renal Stone Study  Result Date: 10/14/2018 CLINICAL DATA:  Difficulty urinating. EXAM: CT ABDOMEN AND PELVIS WITHOUT CONTRAST TECHNIQUE: Multidetector CT imaging of the abdomen and pelvis was performed following the standard protocol without IV contrast. COMPARISON:  07/01/2018 FINDINGS: Lower chest: 5 mm nodule right lung base stable since prior study and comparing back to 01/21/2016 consistent with benign etiology. Calcified granuloma noted posterior left lower lobe. Hepatobiliary: No focal abnormality in the liver on this study without intravenous contrast. Numerous calcified  gallstones measure up to 6-7 mm. Gallstone noted in the neck of the gallbladder. No intrahepatic or extrahepatic biliary dilation. Pancreas: Pancreas is diffusely fatty replaced. Spleen: No splenomegaly. No focal mass lesion. Adrenals/Urinary Tract: No adrenal nodule or mass. 3 mm and 5 mm nonobstructing stones are seen in the lower pole right kidney. No right ureteral stones. No secondary changes in the right kidney or ureter. 7 mm and 5 mm nonobstructing stones are seen in the lower pole left kidney. No left ureteral stones. No secondary changes in the left kidney or ureter. No bladder stones. Stomach/Bowel: Stomach is unremarkable. No gastric wall thickening. No evidence of outlet obstruction. Duodenum is normally positioned as is the ligament of Treitz. No small bowel wall thickening. No small bowel dilatation. The terminal ileum is normal. The appendix is normal. No gross colonic mass. No colonic wall thickening. Diverticular changes are noted in the left colon without evidence of diverticulitis. Vascular/Lymphatic: There is abdominal aortic atherosclerosis without aneurysm. There is no gastrohepatic or hepatoduodenal ligament lymphadenopathy. No intraperitoneal or retroperitoneal lymphadenopathy. No pelvic sidewall lymphadenopathy. Reproductive: Unremarkable. Other: No intraperitoneal free fluid. Musculoskeletal: No worrisome lytic or sclerotic osseous abnormality. IMPRESSION: 1. Bilateral nonobstructing nephrolithiasis. No ureteral or bladder stones. No secondary changes in either kidney or ureter. 2. Cholelithiasis. 3. Left colonic diverticulosis without diverticulitis 4.  Aortic Atherosclerois (ICD10-170.0) Electronically Signed   By: Misty Stanley M.D.   On: 10/14/2018 17:42    Procedures Procedures (including critical care time)  Medications Ordered in ED Medications - No data to display   Initial Impression / Assessment and Plan / ED Course  I have reviewed the triage vital signs and the  nursing notes.  Pertinent labs & imaging results that were available during my care of  the patient were reviewed by me and considered in my medical decision making (see chart for details).       Patient with probable UTI.  Urine sent for culture.  Given dose of IV Rocephin as she has tolerated this in the past.  Discharge with course of Keflex and advised to follow-up with her primary physician.  Renal ultrasound with no obstructing stones or evidence of pyelonephritis.  Return precautions given.   Final Clinical Impressions(s) / ED Diagnoses   Final diagnoses:  Lower urinary tract infectious disease    ED Discharge Orders         Ordered    cephALEXin (KEFLEX) 500 MG capsule  3 times daily     10/14/18 1849           Julianne Rice, MD 10/14/18 1858

## 2018-10-14 NOTE — ED Notes (Signed)
Call to lab to add ordered labs to the tubes already in the lab

## 2018-10-14 NOTE — ED Notes (Signed)
Port de-accessed after flushing with Heparin 500units IV

## 2018-10-14 NOTE — ED Notes (Signed)
Sister states that pt must go home in an ambulance.  [facility] for non emergency transport back home.

## 2018-10-14 NOTE — ED Notes (Signed)
Port accessed using sterile technique, labs drawn and sent to lab.  Urine collected and sent to lab

## 2018-10-14 NOTE — ED Triage Notes (Addendum)
Pt brought in by EMS. Family reported they wanted her PCP to call in med from UTI, but he would not prescribe. Community paramedic checked on pt earlier in the day and  Reported she sounded like she had fluid in lungs so pcp said to bring her in.  Sister is in room and reports that she has been having difficulty urinating, not as coherent and off balance

## 2018-10-16 LAB — URINE CULTURE: Culture: 70000 — AB

## 2018-10-17 ENCOUNTER — Telehealth: Payer: Self-pay | Admitting: Emergency Medicine

## 2018-10-17 NOTE — Telephone Encounter (Signed)
Post ED Visit - Positive Culture Follow-up: Unsuccessful Patient Follow-up  Culture assessed and recommendations reviewed by:  []  Elenor Quinones, Pharm.D. []  Heide Guile, Pharm.D., BCPS AQ-ID []  Parks Neptune, Pharm.D., BCPS []  Alycia Rossetti, Pharm.D., BCPS []  [place], [place].D., BCPS, AAHIVP []  Legrand Como, Pharm.D., BCPS, AAHIVP [x]  Gaylene Brooks, PharmD []  Vincenza Hews, PharmD, BCPS  Positive urine culture  []  Patient discharged without antimicrobial prescription and treatment is now indicated [x]  Organism is resistant to prescribed ED discharge antimicrobial []  Patient with positive blood cultures   Unable to contact patient at phone number on file, letter will be sent to address on file  Rachel Morgan 10/17/2018, 6:19 PM

## 2018-10-17 NOTE — Progress Notes (Addendum)
ED Antimicrobial Stewardship Positive Culture Follow Up   Rachel Morgan is an 68 y.o. female who presented to [facility] on 10/14/2018 with a chief complaint of  Chief Complaint  Patient presents with  . Urinary Retention    Recent Results (from the past 720 hour(s))  Urine culture     Status: Abnormal   Collection Time: 10/14/18  3:38 PM   Specimen: Urine, Catheterized  Result Value Ref Range Status   Specimen Description   Final    URINE, CATHETERIZED Performed at [facility], [address] [place], [place] [address]    Special Requests   Final    NONE Performed at [facility], [address], [place], [place] [address]    Culture (A)  Final    70,000 COLONIES/mL ESCHERICHIA COLI Confirmed Extended Spectrum Beta-Lactamase Producer (ESBL).  In bloodstream infections from ESBL organisms, carbapenems are preferred over piperacillin/tazobactam. They are shown to have a lower risk of mortality.    Report Status 10/16/2018 FINAL  Final   Organism ID, Bacteria ESCHERICHIA COLI (A)  Final      Susceptibility   Escherichia coli - MIC*    AMPICILLIN >=32 RESISTANT Resistant     CEFAZOLIN >=64 RESISTANT Resistant     CEFTRIAXONE >=64 RESISTANT Resistant     CIPROFLOXACIN >=4 RESISTANT Resistant     GENTAMICIN >=16 RESISTANT Resistant     IMIPENEM <=0.25 SENSITIVE Sensitive     NITROFURANTOIN <=16 SENSITIVE Sensitive     TRIMETH/SULFA <=20 SENSITIVE Sensitive     AMPICILLIN/SULBACTAM >=32 RESISTANT Resistant     PIP/TAZO 8 SENSITIVE Sensitive     Extended ESBL POSITIVE Resistant     * 70,000 COLONIES/mL ESCHERICHIA COLI    [x]  Treated with Keflex, organism resistant to prescribed antimicrobial []  Patient discharged originally without antimicrobial agent and treatment is now indicated  New antibiotic prescription: fosfomycin 3gm PO x 1, if PCP hasn't addressed UTI/abx changes  ED Provider: Lilla Shook D. Mina Marble, PharmD, BCPS, [place] 10/17/2018, 4:54 PM

## 2018-11-11 ENCOUNTER — Emergency Department (HOSPITAL_COMMUNITY)
Admission: EM | Admit: 2018-11-11 | Discharge: 2018-11-11 | Disposition: A | Discharge status: Home / Self Care | Payer: Medicare Other | Attending: Emergency Medicine | Admitting: Emergency Medicine

## 2018-11-11 ENCOUNTER — Emergency Department (HOSPITAL_COMMUNITY): Payer: Medicare Other

## 2018-11-11 ENCOUNTER — Encounter (HOSPITAL_COMMUNITY): Payer: Self-pay | Admitting: Emergency Medicine

## 2018-11-11 ENCOUNTER — Other Ambulatory Visit: Payer: Self-pay

## 2018-11-11 DIAGNOSIS — N200 Calculus of kidney: Secondary | ICD-10-CM | POA: Diagnosis not present

## 2018-11-11 DIAGNOSIS — I130 Hypertensive heart and chronic kidney disease with heart failure and stage 1 through stage 4 chronic kidney disease, or unspecified chronic kidney disease: Secondary | ICD-10-CM | POA: Insufficient documentation

## 2018-11-11 DIAGNOSIS — K8020 Calculus of gallbladder without cholecystitis without obstruction: Secondary | ICD-10-CM | POA: Insufficient documentation

## 2018-11-11 DIAGNOSIS — I5032 Chronic diastolic (congestive) heart failure: Secondary | ICD-10-CM | POA: Insufficient documentation

## 2018-11-11 DIAGNOSIS — R109 Unspecified abdominal pain: Secondary | ICD-10-CM

## 2018-11-11 DIAGNOSIS — E1122 Type 2 diabetes mellitus with diabetic chronic kidney disease: Secondary | ICD-10-CM | POA: Diagnosis not present

## 2018-11-11 DIAGNOSIS — N183 Chronic kidney disease, stage 3 unspecified: Secondary | ICD-10-CM | POA: Insufficient documentation

## 2018-11-11 DIAGNOSIS — Z7982 Long term (current) use of aspirin: Secondary | ICD-10-CM | POA: Insufficient documentation

## 2018-11-11 DIAGNOSIS — Z79899 Other long term (current) drug therapy: Secondary | ICD-10-CM | POA: Insufficient documentation

## 2018-11-11 DIAGNOSIS — E039 Hypothyroidism, unspecified: Secondary | ICD-10-CM | POA: Diagnosis not present

## 2018-11-11 DIAGNOSIS — R1031 Right lower quadrant pain: Secondary | ICD-10-CM | POA: Diagnosis present

## 2018-11-11 LAB — URINALYSIS, ROUTINE W REFLEX MICROSCOPIC
Bilirubin Urine: NEGATIVE
Glucose, UA: NEGATIVE mg/dL
Hgb urine dipstick: NEGATIVE
Ketones, ur: NEGATIVE mg/dL
Leukocytes,Ua: NEGATIVE
Nitrite: NEGATIVE
Protein, ur: NEGATIVE mg/dL
Specific Gravity, Urine: 1.014 (ref 1.005–1.030)
pH: 5 (ref 5.0–8.0)

## 2018-11-11 LAB — COMPREHENSIVE METABOLIC PANEL
ALT: 11 U/L (ref 0–44)
AST: 14 U/L — ABNORMAL LOW (ref 15–41)
Albumin: 3.9 g/dL (ref 3.5–5.0)
Alkaline Phosphatase: 88 U/L (ref 38–126)
Anion gap: 8 (ref 5–15)
BUN: 41 mg/dL — ABNORMAL HIGH (ref 8–23)
CO2: 25 mmol/L (ref 22–32)
Calcium: 8.8 mg/dL — ABNORMAL LOW (ref 8.9–10.3)
Chloride: 106 mmol/L (ref 98–111)
Creatinine, Ser: 2.06 mg/dL — ABNORMAL HIGH (ref 0.44–1.00)
GFR calc Af Amer: 28 mL/min — ABNORMAL LOW (ref >60)
GFR calc non Af Amer: 24 mL/min — ABNORMAL LOW (ref >60)
Glucose, Bld: 130 mg/dL — ABNORMAL HIGH (ref 70–99)
Potassium: 3.7 mmol/L (ref 3.5–5.1)
Sodium: 139 mmol/L (ref 135–145)
Total Bilirubin: 0.2 mg/dL — ABNORMAL LOW (ref 0.3–1.2)
Total Protein: 7.3 g/dL (ref 6.5–8.1)

## 2018-11-11 LAB — CBC
HCT: 37.5 % (ref 36.0–46.0)
Hemoglobin: 11.5 g/dL — ABNORMAL LOW (ref 12.0–15.0)
MCH: 28.7 pg (ref 26.0–34.0)
MCHC: 30.7 g/dL (ref 30.0–36.0)
MCV: 93.5 fL (ref 80.0–100.0)
Platelets: 207 10*3/uL (ref 150–400)
RBC: 4.01 MIL/uL (ref 3.87–5.11)
RDW: 14.3 % (ref 11.5–15.5)
WBC: 5 10*3/uL (ref 4.0–10.5)
nRBC: 0 % (ref 0.0–0.2)

## 2018-11-11 LAB — LIPASE, BLOOD: Lipase: 17 U/L (ref 11–51)

## 2018-11-11 MED ORDER — HYDROMORPHONE HCL 1 MG/ML IJ SOLN
0.5000 mg | Freq: Once | INTRAMUSCULAR | Status: AC
  Administered 2018-11-11: 17:00:00 0.5 mg via INTRAVENOUS
  Filled 2018-11-11: qty 1

## 2018-11-11 MED ORDER — ONDANSETRON HCL 4 MG/2ML IJ SOLN
4.0000 mg | Freq: Once | INTRAMUSCULAR | Status: AC
  Administered 2018-11-11: 4 mg via INTRAVENOUS
  Filled 2018-11-11: qty 2

## 2018-11-11 NOTE — ED Triage Notes (Signed)
Pt from home.  Increased weakness and right sided flank pain.

## 2018-11-11 NOTE — ED Notes (Signed)
Lab at bedside

## 2018-11-11 NOTE — ED Provider Notes (Addendum)
[facility] EMERGENCY DEPARTMENT Provider Note   CSN: [id number] Arrival date & time: 11/11/18  1615     History   Chief Complaint Chief Complaint  Patient presents with   Weakness    HPI Rachel Morgan is a 68 y.o. female.     Patient with hx kidney stones, c/o acute onset right flank pain. Symptoms acute onset, moderate, dull, persistent, non radiating, right flank. No hematuria or dysuria. No vomiting or diarrhea. No trauma or fall. No syncope. Denies chest pain or discomfort. No cough. No fever or chills.   The history is provided by the patient.  Weakness Associated symptoms: no chest pain, no cough, no fever, no headaches, no shortness of breath and no vomiting     Past Medical History:  Diagnosis Date   Anxiety    Calculus of gallbladder    Cerebral palsy (HCC)    Chronic kidney disease    Diabetes mellitus    Gall stones    GERD (gastroesophageal reflux disease)    High cholesterol    History of kidney stones    Hypertension    Hypothyroidism    Kidney stones    MR (mental retardation)    Pyelonephritis    Sepsis ([place])     Patient Active Problem List   Diagnosis Date Noted   Acute renal failure superimposed on stage 3 chronic kidney disease ([place]) 06/03/2018   Acute on chronic diastolic CHF (congestive heart failure) ([place]) 06/03/2018   Acute respiratory failure with hypoxia ([place]) 06/03/2018   Hypokalemia 06/03/2018   Morbid obesity with BMI of 50.0-59.9, adult ([place]) 06/03/2018   Acute exacerbation of CHF (congestive heart failure) ([place]) 06/01/2018   Hypoxia 06/01/2018   Pneumonia 05/09/2018   Acute respiratory failure ([place]) 47/09/6281   Acute metabolic encephalopathy 66/29/4765   Hypomagnesemia 04/21/2018   UTI (urinary tract infection) 04/20/2018   PICC (peripherally inserted central catheter) in place    Urinary tract infection without hematuria    Hydronephrosis with renal and ureteral calculus obstruction  01/21/2016   Hyperkalemia 01/21/2016   Palliative care encounter    DNR (do not resuscitate) discussion    Encephalopathy 10/17/2015   Goals of care, counseling/discussion 10/17/2015   Nephrolithiasis 09/27/2015   Cerebral palsy ([place]) 09/27/2015   Sepsis ([place]) 09/26/2015   AKI (acute kidney injury) ([place]) 09/26/2015   Fever    Urinary tract infectious disease    Dilated cbd, acquired 09/14/2015   Hypothyroidism 09/14/2015   Hypertension 09/14/2015   Depression with anxiety 09/14/2015   UPJ obstruction, acquired 09/14/2015   CKD (chronic kidney disease), stage III ([place]) 09/14/2015   GERD (gastroesophageal reflux disease) 09/14/2015   Calculus of gallbladder without cholecystitis without obstruction    Abdominal pain, right upper quadrant 02/11/2011   Acute pyelonephritis 02/11/2011   Agoraphobia 02/11/2011   Acute renal failure ([place]) 02/11/2011   Hyponatremia 02/11/2011   Dehydration 02/11/2011    Past Surgical History:  Procedure Laterality Date   CYSTOSCOPY W/ URETERAL STENT PLACEMENT Bilateral 09/26/2015   Procedure: CYSTOSCOPY WITH Bilateral  RETROGRADE PYELOGRAM/URETERAL STENT PLACEMENT;  Surgeon: [name], MD;  Location: [facility];  Service: Urology;  Laterality: Bilateral;   CYSTOSCOPY W/ URETERAL STENT PLACEMENT Right 04/22/2018   Procedure: CYSTOSCOPY WITH RETROGRADE PYELOGRAM/URETERAL STENT PLACEMENT;  Surgeon: Cleon Gustin, MD;  Location: [facility];  Service: Urology;  Laterality: Right;   CYSTOSCOPY W/ URETERAL STENT REMOVAL Left 11/10/2015   Procedure: CYSTOSCOPY WITH STENT REMOVAL;  Surgeon: Cleon Gustin, MD;  Location:  [facility];  Service: Urology;  Laterality: Left;   CYSTOSCOPY WITH RETROGRADE PYELOGRAM, URETEROSCOPY AND STENT PLACEMENT Right 06/24/2018   Procedure: CYSTOSCOPY WITH RIGHT RETROGRADE PYELOGRAM, RIGHT URETEROSCOPY AND RIGHT URETERAL STENT EXCHANGE;  Surgeon: Cleon Gustin, MD;  Location: [facility];  Service:  Urology;  Laterality: Right;   CYSTOSCOPY WITH STENT PLACEMENT Right 01/21/2016   Procedure: CYSTOSCOPY WITH STENT PLACEMENT;  Surgeon: Franchot Gallo, MD;  Location: [facility];  Service: Urology;  Laterality: Right;   CYSTOSCOPY/RETROGRADE/URETEROSCOPY/STONE EXTRACTION WITH BASKET Left 10/31/2015   Procedure: CYSTOSCOPY/ BILATERAL URETEROSCOPY/BILATERAL STONE EXTRACTION/ LEFT STENT PLACEMENT;  Surgeon: Irine Seal, MD;  Location: [facility];  Service: Urology;  Laterality: Left;   CYSTOSCOPY/URETEROSCOPY/HOLMIUM LASER/STENT PLACEMENT Right 02/27/2016   Procedure: CYSTOSCOPY/RIGHT URETEROSCOPY/HOLMIUM LASER/STENT PLACEMENT/STENT REMOVAL;  Surgeon: Irine Seal, MD;  Location: [facility];  Service: Urology;  Laterality: Right;   HOLMIUM LASER APPLICATION N/A 32/99/2426   Procedure: HOLMIUM LASER APPLICATION;  Surgeon: Irine Seal, MD;  Location: [facility];  Service: Urology;  Laterality: N/A;   HOLMIUM LASER APPLICATION Right 8/34/1962   Procedure: HOLMIUM LASER APPLICATION;  Surgeon: Cleon Gustin, MD;  Location: [facility];  Service: Urology;  Laterality: Right;   IR CV LINE INJECTION  05/06/2018   IR FLUORO GUIDE PORT INSERTION RIGHT  04/10/2016   IR US GUIDE VASC ACCESS RIGHT  04/10/2016     OB History   No obstetric history on file.      Home Medications    Prior to Admission medications   Medication Sig Start Date End Date Taking? Authorizing Provider  acetaminophen (TYLENOL) 325 MG tablet Take 2 tablets (650 mg total) by mouth every 6 (six) hours as needed for mild pain (or Fever >/= 101). 06/08/18   Roxan Hockey, MD  ALPRAZolam Duanne Moron) 1 MG tablet Take 1 tablet (1 mg total) by mouth 2 (two) times daily as needed for anxiety. 06/17/18   Roxan Hockey, MD  aspirin EC 81 MG tablet Take 1 tablet (81 mg total) by mouth daily with breakfast. 06/08/18   Roxan Hockey, MD  blood glucose meter kit and supplies Relion Prime or Dispense other brand based on patient and insurance preference. Use up  to four times daily as directed. (FOR ICD-9 250.00, 250.01).  DX--R 73.9 Patient not taking: Reported on 10/14/2018 06/17/18   Roxan Hockey, MD  busPIRone (BUSPAR) 10 MG tablet Take 10 mg by mouth 2 (two) times daily.    [provider]  cephALEXin (KEFLEX) 500 MG capsule Take 1 capsule (500 mg total) by mouth 3 (three) times daily. 10/15/18   Julianne Rice, MD  chlorproMAZINE (THORAZINE) 25 MG tablet Take 1 tablet by mouth 2 (two) times daily as needed. 07/09/18   [provider]  escitalopram (LEXAPRO) 20 MG tablet Take 1 tablet (20 mg total) by mouth daily. 06/17/18   Roxan Hockey, MD  glucose blood test strip Use as instructed--- Dx R73.9 Patient not taking: Reported on 10/14/2018 06/17/18   Roxan Hockey, MD  levothyroxine (SYNTHROID, LEVOTHROID) 50 MCG tablet Take 50 mcg by mouth daily before breakfast.    [provider]  midodrine (PROAMATINE) 10 MG tablet Take 1 tablet (10 mg total) by mouth 3 (three) times daily with meals. 06/17/18   Emokpae, Courage, MD  OLANZapine (ZYPREXA) 20 MG tablet Take 20 mg by mouth at bedtime.    [provider]  omeprazole (PRILOSEC) 20 MG capsule Take 20 mg by mouth daily before breakfast.     [provider]  oxybutynin (  DITROPAN-XL) 10 MG 24 hr tablet Take 10 mg by mouth every morning.     [provider]  potassium chloride SA (K-DUR) 20 MEQ tablet Take 2 tablets (40 mEq total) by mouth daily. 06/17/18   Emokpae, Courage, MD  senna-docusate (SENOKOT-S) 8.6-50 MG tablet Take 1 tablet by mouth 2 (two) times daily. Patient taking differently: Take 1 tablet by mouth 2 (two) times daily as needed.  01/26/16   Raiford Noble Latif, DO  sodium bicarbonate 650 MG tablet Take 1 tablet (650 mg total) by mouth daily. 06/08/18   Roxan Hockey, MD  torsemide (DEMADEX) 20 MG tablet Take 1 tablet (20 mg total) by mouth 2 (two) times daily. For Fluid 06/08/18   Roxan Hockey, MD  traMADol (ULTRAM) 50 MG tablet  Take 1 tablet (50 mg total) by mouth every 6 (six) hours as needed. Patient not taking: Reported on 10/14/2018 06/24/18 06/24/19  Cleon Gustin, MD  zolpidem (AMBIEN) 5 MG tablet Take 1 tablet (5 mg total) by mouth at bedtime as needed for sleep. Patient taking differently: Take 5 mg by mouth at bedtime.  06/17/18   Roxan Hockey, MD    Family History Family History  Family history unknown: Yes    Social History Social History   Tobacco Use   Smoking status: Never Smoker   Smokeless tobacco: Never Used  Substance Use Topics   Alcohol use: No   Drug use: No     Allergies   Macrobid [nitrofurantoin monohyd macro] and Penicillins   Review of Systems Review of Systems  Constitutional: Negative for fever.  HENT: Negative for sore throat.   Eyes: Negative for redness.  Respiratory: Negative for cough and shortness of breath.   Cardiovascular: Negative for chest pain.  Gastrointestinal: Negative for vomiting.  Endocrine: Negative for polyuria.  Genitourinary: Positive for flank pain.  Musculoskeletal: Negative for back pain.  Skin: Negative for rash.  Neurological: Negative for headaches.  Hematological: Does not bruise/bleed easily.  Psychiatric/Behavioral: Negative for confusion.     Physical Exam Updated Vital Signs BP 129/60    Pulse 75    Temp (!) 97.4 F (36.3 C)    Resp 20    Wt (!) 149.7 kg    SpO2 96%    BMI 64.45 kg/m   Physical Exam Vitals signs and nursing note reviewed.  Constitutional:      Appearance: Normal appearance. She is well-developed.  HENT:     Head: Atraumatic.     Nose: Nose normal.     Mouth/Throat:     Mouth: Mucous membranes are moist.  Eyes:     General: No scleral icterus.    Conjunctiva/sclera: Conjunctivae normal.  Neck:     Musculoskeletal: Normal range of motion and neck supple. No neck rigidity or muscular tenderness.     Trachea: No tracheal deviation.  Cardiovascular:     Rate and Rhythm: Normal rate and  regular rhythm.     Pulses: Normal pulses.     Heart sounds: Normal heart sounds. No murmur. No friction rub. No gallop.   Pulmonary:     Effort: Pulmonary effort is normal. No respiratory distress.     Breath sounds: Normal breath sounds.  Abdominal:     General: Bowel sounds are normal. There is no distension.     Palpations: Abdomen is soft. There is no mass.     Tenderness: There is no abdominal tenderness. There is no guarding or rebound.     Hernia: No hernia  is present.     Comments: Obese.  Genitourinary:    Comments: No cva tenderness.  Musculoskeletal:        General: No swelling.  Skin:    General: Skin is warm and dry.     Findings: No rash.  Neurological:     Mental Status: She is alert.     Comments: Alert, speech normal.   Psychiatric:        Mood and Affect: Mood normal.      ED Treatments / Results  Labs (all labs ordered are listed, but only abnormal results are displayed) Results for orders placed or performed during the hospital encounter of 11/11/18  CBC  Result Value Ref Range   WBC 5.0 4.0 - 10.5 K/uL   RBC 4.01 3.87 - 5.11 MIL/uL   Hemoglobin 11.5 (L) 12.0 - 15.0 g/dL   HCT 37.5 36.0 - 46.0 %   MCV 93.5 80.0 - 100.0 fL   MCH 28.7 26.0 - 34.0 pg   MCHC 30.7 30.0 - 36.0 g/dL   RDW 14.3 11.5 - 15.5 %   Platelets 207 150 - 400 K/uL   nRBC 0.0 0.0 - 0.2 %  CMET  Result Value Ref Range   Sodium 139 135 - 145 mmol/L   Potassium 3.7 3.5 - 5.1 mmol/L   Chloride 106 98 - 111 mmol/L   CO2 25 22 - 32 mmol/L   Glucose, Bld 130 (H) 70 - 99 mg/dL   BUN 41 (H) 8 - 23 mg/dL   Creatinine, Ser 2.06 (H) 0.44 - 1.00 mg/dL   Calcium 8.8 (L) 8.9 - 10.3 mg/dL   Total Protein 7.3 6.5 - 8.1 g/dL   Albumin 3.9 3.5 - 5.0 g/dL   AST 14 (L) 15 - 41 U/L   ALT 11 0 - 44 U/L   Alkaline Phosphatase 88 38 - 126 U/L   Total Bilirubin 0.2 (L) 0.3 - 1.2 mg/dL   GFR calc non Af Amer 24 (L) >60 mL/min   GFR calc Af Amer 28 (L) >60 mL/min   Anion gap 8 5 - 15  Lipase   Result Value Ref Range   Lipase 17 11 - 51 U/L  UA  Result Value Ref Range   Color, Urine YELLOW YELLOW   APPearance CLEAR CLEAR   Specific Gravity, Urine 1.014 1.005 - 1.030   pH 5.0 5.0 - 8.0   Glucose, UA NEGATIVE NEGATIVE mg/dL   Hgb urine dipstick NEGATIVE NEGATIVE   Bilirubin Urine NEGATIVE NEGATIVE   Ketones, ur NEGATIVE NEGATIVE mg/dL   Protein, ur NEGATIVE NEGATIVE mg/dL   Nitrite NEGATIVE NEGATIVE   Leukocytes,Ua NEGATIVE NEGATIVE   Ct Renal Stone Study  Result Date: 11/11/2018 CLINICAL DATA:  Right flank pain. History of kidney stones. EXAM: CT ABDOMEN AND PELVIS WITHOUT CONTRAST TECHNIQUE: Multidetector CT imaging of the abdomen and pelvis was performed following the standard protocol without IV contrast. COMPARISON:  10/14/2018 FINDINGS: Lower chest: Mild respiratory motion artifact through the lung bases. No consolidation or pleural effusion. Three-vessel coronary artery atherosclerosis. Hepatobiliary: No focal liver abnormality is identified within limitations of noncontrast technique and motion. Multiple small stones are again seen in the gallbladder including a stone in the region of the gallbladder neck as seen previously. No gross pericholecystic inflammation is evident. There is no evidence of significant biliary dilatation. Pancreas: Fatty replacement of the pancreas diffusely. Spleen: Unremarkable. Adrenals/Urinary Tract: Unremarkable adrenal glands. Small stones in both kidneys are unchanged, measuring up to 7 mm  in the left lower pole. No hydroureteronephrosis or ureteral calculi are seen. The bladder is unremarkable. Stomach/Bowel: The stomach is nondistended. A moderate amount of stool is present in the right colon. There is mild gaseous distension of the transverse colon, and the descending and sigmoid colon are collapsed. There is no evidence of bowel obstruction. Left-sided colonic diverticulosis is noted without evidence of diverticulitis. Vascular/Lymphatic: Minimal  abdominal aortic atherosclerosis without aneurysm. No enlarged lymph nodes. Reproductive: Unremarkable. Other: No intraperitoneal free fluid. Musculoskeletal: No suspicious osseous lesion. Unchanged mild L3 compression fracture. Moderately advanced lower lumbar disc and facet degeneration. IMPRESSION: 1. No acute abnormality identified in the abdomen or pelvis. 2. Nonobstructing bilateral nephrolithiasis. 3. Cholelithiasis. 4. Colonic diverticulosis. 5. Aortic Atherosclerosis (ICD10-I70.0). Electronically Signed   By: Logan Bores M.D.   On: 11/11/2018 18:13   Ct Renal Stone Study  Result Date: 10/14/2018 CLINICAL DATA:  Difficulty urinating. EXAM: CT ABDOMEN AND PELVIS WITHOUT CONTRAST TECHNIQUE: Multidetector CT imaging of the abdomen and pelvis was performed following the standard protocol without IV contrast. COMPARISON:  07/01/2018 FINDINGS: Lower chest: 5 mm nodule right lung base stable since prior study and comparing back to 01/21/2016 consistent with benign etiology. Calcified granuloma noted posterior left lower lobe. Hepatobiliary: No focal abnormality in the liver on this study without intravenous contrast. Numerous calcified gallstones measure up to 6-7 mm. Gallstone noted in the neck of the gallbladder. No intrahepatic or extrahepatic biliary dilation. Pancreas: Pancreas is diffusely fatty replaced. Spleen: No splenomegaly. No focal mass lesion. Adrenals/Urinary Tract: No adrenal nodule or mass. 3 mm and 5 mm nonobstructing stones are seen in the lower pole right kidney. No right ureteral stones. No secondary changes in the right kidney or ureter. 7 mm and 5 mm nonobstructing stones are seen in the lower pole left kidney. No left ureteral stones. No secondary changes in the left kidney or ureter. No bladder stones. Stomach/Bowel: Stomach is unremarkable. No gastric wall thickening. No evidence of outlet obstruction. Duodenum is normally positioned as is the ligament of Treitz. No small bowel wall  thickening. No small bowel dilatation. The terminal ileum is normal. The appendix is normal. No gross colonic mass. No colonic wall thickening. Diverticular changes are noted in the left colon without evidence of diverticulitis. Vascular/Lymphatic: There is abdominal aortic atherosclerosis without aneurysm. There is no gastrohepatic or hepatoduodenal ligament lymphadenopathy. No intraperitoneal or retroperitoneal lymphadenopathy. No pelvic sidewall lymphadenopathy. Reproductive: Unremarkable. Other: No intraperitoneal free fluid. Musculoskeletal: No worrisome lytic or sclerotic osseous abnormality. IMPRESSION: 1. Bilateral nonobstructing nephrolithiasis. No ureteral or bladder stones. No secondary changes in either kidney or ureter. 2. Cholelithiasis. 3. Left colonic diverticulosis without diverticulitis 4.  Aortic Atherosclerois (ICD10-170.0) Electronically Signed   By: Misty Stanley M.D.   On: 10/14/2018 17:42    EKG None  Radiology Ct Renal Stone Study  Result Date: 11/11/2018 CLINICAL DATA:  Right flank pain. History of kidney stones. EXAM: CT ABDOMEN AND PELVIS WITHOUT CONTRAST TECHNIQUE: Multidetector CT imaging of the abdomen and pelvis was performed following the standard protocol without IV contrast. COMPARISON:  10/14/2018 FINDINGS: Lower chest: Mild respiratory motion artifact through the lung bases. No consolidation or pleural effusion. Three-vessel coronary artery atherosclerosis. Hepatobiliary: No focal liver abnormality is identified within limitations of noncontrast technique and motion. Multiple small stones are again seen in the gallbladder including a stone in the region of the gallbladder neck as seen previously. No gross pericholecystic inflammation is evident. There is no evidence of significant biliary dilatation. Pancreas: Fatty replacement of  the pancreas diffusely. Spleen: Unremarkable. Adrenals/Urinary Tract: Unremarkable adrenal glands. Small stones in both kidneys are unchanged,  measuring up to 7 mm in the left lower pole. No hydroureteronephrosis or ureteral calculi are seen. The bladder is unremarkable. Stomach/Bowel: The stomach is nondistended. A moderate amount of stool is present in the right colon. There is mild gaseous distension of the transverse colon, and the descending and sigmoid colon are collapsed. There is no evidence of bowel obstruction. Left-sided colonic diverticulosis is noted without evidence of diverticulitis. Vascular/Lymphatic: Minimal abdominal aortic atherosclerosis without aneurysm. No enlarged lymph nodes. Reproductive: Unremarkable. Other: No intraperitoneal free fluid. Musculoskeletal: No suspicious osseous lesion. Unchanged mild L3 compression fracture. Moderately advanced lower lumbar disc and facet degeneration. IMPRESSION: 1. No acute abnormality identified in the abdomen or pelvis. 2. Nonobstructing bilateral nephrolithiasis. 3. Cholelithiasis. 4. Colonic diverticulosis. 5. Aortic Atherosclerosis (ICD10-I70.0). Electronically Signed   By: Logan Bores M.D.   On: 11/11/2018 18:13    Procedures Procedures (including critical care time)  Medications Ordered in ED Medications  HYDROmorphone (DILAUDID) injection 0.5 mg (has no administration in time range)  ondansetron (ZOFRAN) injection 4 mg (has no administration in time range)     Initial Impression / Assessment and Plan / ED Course  I have reviewed the triage vital signs and the nursing notes.  Pertinent labs & imaging results that were available during my care of the patient were reviewed by me and considered in my medical decision making (see chart for details).  Iv ns. Stat labs sent. Dilaudid iv. zofran iv.   Reviewed nursing notes and prior charts for additional history.   With acute/severe right flank pain, will get ct r/o ureteral stone.   Labs reviewed/interpreted by me - lfts normal. Lipase normal. Wbc normal.  CT reviewed/interpreted by me - kidney stones/gallstones, no  acute process.  On recheck, pt comfortable, no distress, no nv. abd soft nt. Discussed results w pt. No fevers. No cva or flank tenderness. abd remains soft and non tender on recheck.   Pt currently appears stable for d/c. rec pcp/gen surg f/u re kidney stones, and gallstones, flank pain. Return precautions provided.   Final Clinical Impressions(s) / ED Diagnoses   Final diagnoses:  None    ED Discharge Orders    None           Lajean Saver, MD 11/11/18 2225

## 2018-11-11 NOTE — Discharge Instructions (Signed)
It was our pleasure to provide your ER care today - we hope that you feel better.  Your ct scan was read as showing no acute process - note was made of gallstones, bilateral kidney stones, and diverticula of the colon - discuss with your primary care doctor.   For gallstones, you may follow up with general surgeon as outpatient to discuss possible treatment options if you experience recurrent gallstone related pain.   Return to ER if worse, new symptoms, fevers, worsening or severe pain, persistent vomiting, or other concern.

## 2018-12-11 ENCOUNTER — Encounter (HOSPITAL_COMMUNITY): Payer: Self-pay

## 2018-12-11 ENCOUNTER — Other Ambulatory Visit: Payer: Self-pay

## 2018-12-11 ENCOUNTER — Emergency Department (HOSPITAL_COMMUNITY)
Admission: EM | Admit: 2018-12-11 | Discharge: 2018-12-11 | Disposition: A | Discharge status: Home / Self Care | Payer: Medicare Other | Attending: Emergency Medicine | Admitting: Emergency Medicine

## 2018-12-11 ENCOUNTER — Emergency Department (HOSPITAL_COMMUNITY): Payer: Medicare Other

## 2018-12-11 DIAGNOSIS — I5042 Chronic combined systolic (congestive) and diastolic (congestive) heart failure: Secondary | ICD-10-CM | POA: Diagnosis not present

## 2018-12-11 DIAGNOSIS — Z20828 Contact with and (suspected) exposure to other viral communicable diseases: Secondary | ICD-10-CM | POA: Diagnosis not present

## 2018-12-11 DIAGNOSIS — E039 Hypothyroidism, unspecified: Secondary | ICD-10-CM | POA: Insufficient documentation

## 2018-12-11 DIAGNOSIS — Z7982 Long term (current) use of aspirin: Secondary | ICD-10-CM | POA: Diagnosis not present

## 2018-12-11 DIAGNOSIS — N183 Chronic kidney disease, stage 3 unspecified: Secondary | ICD-10-CM | POA: Insufficient documentation

## 2018-12-11 DIAGNOSIS — Z79899 Other long term (current) drug therapy: Secondary | ICD-10-CM | POA: Insufficient documentation

## 2018-12-11 DIAGNOSIS — I130 Hypertensive heart and chronic kidney disease with heart failure and stage 1 through stage 4 chronic kidney disease, or unspecified chronic kidney disease: Secondary | ICD-10-CM | POA: Diagnosis not present

## 2018-12-11 DIAGNOSIS — E1122 Type 2 diabetes mellitus with diabetic chronic kidney disease: Secondary | ICD-10-CM | POA: Insufficient documentation

## 2018-12-11 DIAGNOSIS — R0902 Hypoxemia: Secondary | ICD-10-CM | POA: Diagnosis present

## 2018-12-11 DIAGNOSIS — G4734 Idiopathic sleep related nonobstructive alveolar hypoventilation: Secondary | ICD-10-CM | POA: Diagnosis not present

## 2018-12-11 LAB — CBC WITH DIFFERENTIAL/PLATELET
Abs Immature Granulocytes: 0.02 10*3/uL (ref 0.00–0.07)
Basophils Absolute: 0 10*3/uL (ref 0.0–0.1)
Basophils Relative: 0 %
Eosinophils Absolute: 0 10*3/uL (ref 0.0–0.5)
Eosinophils Relative: 0 %
HCT: 36.7 % (ref 36.0–46.0)
Hemoglobin: 11.2 g/dL — ABNORMAL LOW (ref 12.0–15.0)
Immature Granulocytes: 0 %
Lymphocytes Relative: 18 %
Lymphs Abs: 0.9 10*3/uL (ref 0.7–4.0)
MCH: 28.8 pg (ref 26.0–34.0)
MCHC: 30.5 g/dL (ref 30.0–36.0)
MCV: 94.3 fL (ref 80.0–100.0)
Monocytes Absolute: 0.4 10*3/uL (ref 0.1–1.0)
Monocytes Relative: 8 %
Neutro Abs: 3.6 10*3/uL (ref 1.7–7.7)
Neutrophils Relative %: 74 %
Platelets: 185 10*3/uL (ref 150–400)
RBC: 3.89 MIL/uL (ref 3.87–5.11)
RDW: 14.3 % (ref 11.5–15.5)
WBC: 4.9 10*3/uL (ref 4.0–10.5)
nRBC: 0 % (ref 0.0–0.2)

## 2018-12-11 LAB — BASIC METABOLIC PANEL
Anion gap: 10 (ref 5–15)
BUN: 41 mg/dL — ABNORMAL HIGH (ref 8–23)
CO2: 28 mmol/L (ref 22–32)
Calcium: 8.7 mg/dL — ABNORMAL LOW (ref 8.9–10.3)
Chloride: 103 mmol/L (ref 98–111)
Creatinine, Ser: 2.14 mg/dL — ABNORMAL HIGH (ref 0.44–1.00)
GFR calc Af Amer: 27 mL/min — ABNORMAL LOW (ref >60)
GFR calc non Af Amer: 23 mL/min — ABNORMAL LOW (ref >60)
Glucose, Bld: 89 mg/dL (ref 70–99)
Potassium: 4.3 mmol/L (ref 3.5–5.1)
Sodium: 141 mmol/L (ref 135–145)

## 2018-12-11 LAB — POC SARS CORONAVIRUS 2 AG -  ED: SARS Coronavirus 2 Ag: NEGATIVE

## 2018-12-11 LAB — TROPONIN I (HIGH SENSITIVITY): Troponin I (High Sensitivity): 4 ng/L (ref <18)

## 2018-12-11 LAB — BRAIN NATRIURETIC PEPTIDE: B Natriuretic Peptide: 70 pg/mL (ref 0.0–100.0)

## 2018-12-11 NOTE — Discharge Instructions (Addendum)
Your lab tests, ekg and chest xray today are stable with no sign of CHF, heart or lung problems as the source of the lower oxygen level you have noted while sleeping.  You may need to have a sleep study to help better determine the reason for this - sleep apnea can cause this.  Discuss this with your primary MD who can arrange this study if they feel it would be helpful.  In the interim, you could use your home oxygen while sleeping if needed.

## 2018-12-11 NOTE — ED Triage Notes (Signed)
Per caregiver, pt's O2 sats dropped to 88%. Put on home oxygen as needed. When patient begins to sleep and relax. O2 sats would decrease. Per family she has also been weak. EMS had pt on room air and O2 sats 96%.

## 2018-12-11 NOTE — ED Provider Notes (Signed)
[facility] EMERGENCY DEPARTMENT Provider Note   CSN: [id number] Arrival date & time: 12/11/18  1419     History   Chief Complaint Chief Complaint  Patient presents with  . hypoxia    HPI Rachel Morgan is a 68 y.o. female with a history as outlined below, most significant for diabetes, chronic kidney disease, cerebral palsy and MR, hypertension, CHF presenting with her sister who is her legal guardian and primary caregiver with complaints of hypoxia.  Patient has home oxygen for as needed use.  Sister reports that while sleeping her oxygen saturations have been dropping to as low as 88%.  She also endorses increased generalized weakness.  She has had no fevers or chills, no vomiting, no diarrhea.  She was just started on Bactrim today for a UTI which was diagnosed by her PCP this week.  Patient is unable to give a history given her chronic medical conditions.  All questions are answered with a yes response.  Sister provides patient's history.     HPI  Past Medical History:  Diagnosis Date  . Anxiety   . Calculus of gallbladder   . Cerebral palsy ([place])   . Chronic kidney disease   . Diabetes mellitus   . Gall stones   . GERD (gastroesophageal reflux disease)   . High cholesterol   . History of kidney stones   . Hypertension   . Hypothyroidism   . Kidney stones   . MR (mental retardation)   . Pyelonephritis   . Sepsis [facility])     Patient Active Problem List   Diagnosis Date Noted  . Acute renal failure superimposed on stage 3 chronic kidney disease ([place]) 06/03/2018  . Acute on chronic diastolic CHF (congestive heart failure) ([place]) 06/03/2018  . Acute respiratory failure with hypoxia ([place]) 06/03/2018  . Hypokalemia 06/03/2018  . Morbid obesity with BMI of 50.0-59.9, adult ([place]) 06/03/2018  . Acute exacerbation of CHF (congestive heart failure) ([place]) 06/01/2018  . Hypoxia 06/01/2018  . Pneumonia 05/09/2018  . Acute respiratory failure ([place]) 05/07/2018  . Acute  metabolic encephalopathy [id number]  . Hypomagnesemia 04/21/2018  . UTI (urinary tract infection) 04/20/2018  . PICC (peripherally inserted central catheter) in place   . Urinary tract infection without hematuria   . Hydronephrosis with renal and ureteral calculus obstruction 01/21/2016  . Hyperkalemia 01/21/2016  . Palliative care encounter   . DNR (do not resuscitate) discussion   . Encephalopathy 10/17/2015  . Goals of care, counseling/discussion 10/17/2015  . Nephrolithiasis 09/27/2015  . Cerebral palsy ([place]) 09/27/2015  . Sepsis ([place]) 09/26/2015  . AKI (acute kidney injury) ([place]) 09/26/2015  . Fever   . Urinary tract infectious disease   . Dilated cbd, acquired 09/14/2015  . Hypothyroidism 09/14/2015  . Hypertension 09/14/2015  . Depression with anxiety 09/14/2015  . UPJ obstruction, acquired 09/14/2015  . CKD (chronic kidney disease), stage III ([place]) 09/14/2015  . GERD (gastroesophageal reflux disease) 09/14/2015  . Calculus of gallbladder without cholecystitis without obstruction   . Abdominal pain, right upper quadrant 02/11/2011  . Acute pyelonephritis 02/11/2011  . Agoraphobia 02/11/2011  . Acute renal failure ([place]) 02/11/2011  . Hyponatremia 02/11/2011  . Dehydration 02/11/2011    Past Surgical History:  Procedure Laterality Date  . CYSTOSCOPY W/ URETERAL STENT PLACEMENT Bilateral 09/26/2015   Procedure: CYSTOSCOPY WITH Bilateral  RETROGRADE PYELOGRAM/URETERAL STENT PLACEMENT;  Surgeon: [name], MD;  Location: [facility];  Service: Urology;  Laterality: Bilateral;  . CYSTOSCOPY W/ URETERAL STENT PLACEMENT Right 04/22/2018  Procedure: CYSTOSCOPY WITH RETROGRADE PYELOGRAM/URETERAL STENT PLACEMENT;  Surgeon: Cleon Gustin, MD;  Location: [facility];  Service: Urology;  Laterality: Right;  . CYSTOSCOPY W/ URETERAL STENT REMOVAL Left 11/10/2015   Procedure: CYSTOSCOPY WITH STENT REMOVAL;  Surgeon: Cleon Gustin, MD;  Location: [facility];  Service: Urology;   Laterality: Left;  . CYSTOSCOPY WITH RETROGRADE PYELOGRAM, URETEROSCOPY AND STENT PLACEMENT Right 06/24/2018   Procedure: CYSTOSCOPY WITH RIGHT RETROGRADE PYELOGRAM, RIGHT URETEROSCOPY AND RIGHT URETERAL STENT EXCHANGE;  Surgeon: Cleon Gustin, MD;  Location: [facility];  Service: Urology;  Laterality: Right;  . CYSTOSCOPY WITH STENT PLACEMENT Right 01/21/2016   Procedure: CYSTOSCOPY WITH STENT PLACEMENT;  Surgeon: Franchot Gallo, MD;  Location: [facility];  Service: Urology;  Laterality: Right;  . CYSTOSCOPY/RETROGRADE/URETEROSCOPY/STONE EXTRACTION WITH BASKET Left 10/31/2015   Procedure: CYSTOSCOPY/ BILATERAL URETEROSCOPY/BILATERAL STONE EXTRACTION/ LEFT STENT PLACEMENT;  Surgeon: Irine Seal, MD;  Location: [facility];  Service: Urology;  Laterality: Left;  . CYSTOSCOPY/URETEROSCOPY/HOLMIUM LASER/STENT PLACEMENT Right 02/27/2016   Procedure: CYSTOSCOPY/RIGHT URETEROSCOPY/HOLMIUM LASER/STENT PLACEMENT/STENT REMOVAL;  Surgeon: Irine Seal, MD;  Location: [facility];  Service: Urology;  Laterality: Right;  . HOLMIUM LASER APPLICATION N/A Q000111Q   Procedure: HOLMIUM LASER APPLICATION;  Surgeon: Irine Seal, MD;  Location: [facility];  Service: Urology;  Laterality: N/A;  . HOLMIUM LASER APPLICATION Right 0000000   Procedure: HOLMIUM LASER APPLICATION;  Surgeon: Cleon Gustin, MD;  Location: [facility];  Service: Urology;  Laterality: Right;  . IR CV LINE INJECTION  05/06/2018  . IR FLUORO GUIDE PORT INSERTION RIGHT  04/10/2016  . IR US GUIDE VASC ACCESS RIGHT  04/10/2016     OB History   No obstetric history on file.      Home Medications    Prior to Admission medications   Medication Sig Start Date End Date Taking? Authorizing Provider  acetaminophen (TYLENOL) 325 MG tablet Take 2 tablets (650 mg total) by mouth every 6 (six) hours as needed for mild pain (or Fever >/= 101). 06/08/18  Yes Emokpae, Courage, MD  ALPRAZolam Duanne Moron) 1 MG tablet Take 1 tablet (1 mg total) by mouth 2 (two) times daily as  needed for anxiety. Patient taking differently: Take 1 mg by mouth at bedtime. *May take one tablet twice daily as needed for anxiety 06/17/18  Yes Emokpae, Courage, MD  aspirin EC 81 MG tablet Take 1 tablet (81 mg total) by mouth daily with breakfast. 06/08/18  Yes Emokpae, Courage, MD  chlorproMAZINE (THORAZINE) 25 MG tablet Take 25 mg by mouth 2 (two) times daily as needed for nausea or vomiting.  07/09/18  Yes [provider]  escitalopram (LEXAPRO) 20 MG tablet Take 1 tablet (20 mg total) by mouth daily. 06/17/18  Yes Emokpae, Courage, MD  levothyroxine (SYNTHROID, LEVOTHROID) 50 MCG tablet Take 50 mcg by mouth daily before breakfast.   Yes [provider]  midodrine (PROAMATINE) 10 MG tablet Take 1 tablet (10 mg total) by mouth 3 (three) times daily with meals. 06/17/18  Yes Emokpae, Courage, MD  OLANZapine (ZYPREXA) 5 MG tablet Take 5 mg by mouth at bedtime. 10/23/18  Yes [provider]  omeprazole (PRILOSEC) 20 MG capsule Take 20 mg by mouth daily before breakfast.    Yes [provider]  oxybutynin (DITROPAN-XL) 10 MG 24 hr tablet Take 10 mg by mouth every morning.    Yes [provider]  potassium chloride SA (K-DUR) 20 MEQ tablet Take 2 tablets (40 mEq total) by mouth daily. 06/17/18  Yes Emokpae,  Courage, MD  senna-docusate (SENOKOT-S) 8.6-50 MG tablet Take 1 tablet by mouth 2 (two) times daily. Patient taking differently: Take 1 tablet by mouth 2 (two) times daily as needed.  01/26/16  Yes Sheikh, Omair Latif, DO  sodium bicarbonate 650 MG tablet Take 1 tablet by mouth daily. 11/23/18  Yes [provider]  torsemide (DEMADEX) 20 MG tablet Take 1 tablet (20 mg total) by mouth 2 (two) times daily. For Fluid 06/08/18  Yes Emokpae, Courage, MD  zolpidem (AMBIEN) 10 MG tablet Take 10 mg by mouth at bedtime. 10/23/18  Yes [provider]  busPIRone (BUSPAR) 5 MG tablet Take 5 mg by mouth 2 (two) times daily. 10/23/18   [provider]  glucose blood test strip Use as instructed--- Dx R73.9 Patient not taking: Reported on 10/14/2018 06/17/18   Roxan Hockey, MD  sulfamethoxazole-trimethoprim (BACTRIM) 400-80 MG tablet Take 1 tablet by mouth 2 (two) times daily. 5 day course starting on 11/09/2018 11/09/18   [provider]    Family History Family History  Family history unknown: Yes    Social History Social History   Tobacco Use  . Smoking status: Never Smoker  . Smokeless tobacco: Never Used  Substance Use Topics  . Alcohol use: No  . Drug use: No     Allergies   Macrobid [nitrofurantoin monohyd macro], Penicillins, and Cefdinir   Review of Systems Review of Systems  Unable to perform ROS: Dementia  Respiratory:       Negative except as mentioned in HPI.      Physical Exam Updated Vital Signs BP (!) 100/54   Pulse (!) 56   Temp (!) 97.4 F (36.3 C) (Axillary)   Resp 13   Ht 5' (1.524 m)   Wt (!) 149.7 kg   SpO2 99%   BMI 64.45 kg/m   Physical Exam Vitals signs and nursing note reviewed.  Constitutional:      Appearance: Normal appearance. She is well-developed. She is obese.  HENT:     Head: Normocephalic and atraumatic.  Eyes:     Conjunctiva/sclera: Conjunctivae normal.  Neck:     Musculoskeletal: Normal range of motion.  Cardiovascular:     Rate and Rhythm: Normal rate and regular rhythm.     Pulses: Normal pulses.     Heart sounds: Normal heart sounds.  Pulmonary:     Effort: Pulmonary effort is normal.     Breath sounds: Normal breath sounds. No stridor. No wheezing, rhonchi or rales.  Abdominal:     General: Bowel sounds are normal.     Palpations: Abdomen is soft.     Tenderness: There is no abdominal tenderness. There is no guarding or rebound.  Musculoskeletal: Normal range of motion.  Skin:    General: Skin is warm and dry.  Neurological:     Mental Status: She is alert.      ED Treatments / Results  Labs (all labs ordered are listed, but only  abnormal results are displayed) Labs Reviewed  CBC WITH DIFFERENTIAL/PLATELET - Abnormal; Notable for the following components:      Result Value   Hemoglobin 11.2 (*)    All other components within normal limits  BASIC METABOLIC PANEL - Abnormal; Notable for the following components:   BUN 41 (*)    Creatinine, Ser 2.14 (*)    Calcium 8.7 (*)    GFR calc non Af Amer 23 (*)    GFR calc Af Amer 27 (*)  All other components within normal limits  BRAIN NATRIURETIC PEPTIDE  POC SARS CORONAVIRUS 2 AG -  ED  TROPONIN I (HIGH SENSITIVITY)  TROPONIN I (HIGH SENSITIVITY)    EKG EKG Interpretation  Date/Time:  Friday December 11 2018 14:20:42 EST Ventricular Rate:  54 PR Interval:    QRS Duration: 105 QT Interval:  507 QTC Calculation: 481 R Axis:   32 Text Interpretation: Atrial fibrillation Low voltage, extremity and precordial leads Consider anterior infarct Nonspecific T abnormalities, lateral leads Confirmed by Gerlene Fee [phone]) on 12/11/2018 5:24:42 PM   Radiology Dg Chest Portable 1 View  Result Date: 12/11/2018 CLINICAL DATA:  Hypoxia, decreased O2 sats and weakness. EXAM: PORTABLE CHEST 1 VIEW COMPARISON:  07/12/2018 FINDINGS: Right-sided Port-A-Cath terminates at the caval to atrial junction. Heart size remains enlarged. Lungs are clear with low lung volumes. No acute bone process. IMPRESSION: No active cardiopulmonary disease. Stable appearance of right-sided Port-A-Cath. Electronically Signed   By: Zetta Bills M.D.   On: 12/11/2018 15:40    Procedures Procedures (including critical care time)  Medications Ordered in ED Medications - No data to display   Initial Impression / Assessment and Plan / ED Course  I have reviewed the triage vital signs and the nursing notes.  Pertinent labs & imaging results that were available during my care of the patient were reviewed by me and considered in my medical decision making (see chart for details).        Labs and  imaging reviewed, ekg reviewed. No a fib, artifact on this tracing.  cxr and bnp negative for CHF exacerbation.  Screening rapid covid test negative.  Pt has had no hypoxia while being monitored here.  Troponin negative for acute elevation.  Encouraged to complete the antibiotic started today by her pcp for her uti. Sx may be due to new onset sleep apnea, encouraged f/u with pcp to discuss and consider sleep study. Suggested trial of oxygen while sleeping.    Final Clinical Impressions(s) / ED Diagnoses   Final diagnoses:  Sleep related hypoxia    ED Discharge Orders    None       Landis Martins 12/11/18 1743    Maudie Flakes, MD 12/12/18 [phone]

## 2018-12-11 NOTE — ED Notes (Signed)
NT getting vitals

## 2018-12-29 ENCOUNTER — Encounter: Payer: Self-pay | Admitting: Urology

## 2019-01-05 ENCOUNTER — Other Ambulatory Visit (HOSPITAL_COMMUNITY): Payer: Self-pay | Admitting: Interventional Radiology

## 2019-01-11 ENCOUNTER — Other Ambulatory Visit (HOSPITAL_COMMUNITY): Payer: Self-pay | Admitting: *Deleted

## 2019-01-12 ENCOUNTER — Other Ambulatory Visit: Payer: Self-pay

## 2019-01-12 ENCOUNTER — Ambulatory Visit (HOSPITAL_COMMUNITY)
Admission: RE | Admit: 2019-01-12 | Discharge: 2019-01-12 | Disposition: A | Discharge status: Home / Self Care | Payer: Medicare Other | Source: Ambulatory Visit | Attending: Nephrology | Admitting: Nephrology

## 2019-01-12 DIAGNOSIS — Z452 Encounter for adjustment and management of vascular access device: Secondary | ICD-10-CM | POA: Insufficient documentation

## 2019-01-12 MED ORDER — HEPARIN SOD (PORK) LOCK FLUSH 100 UNIT/ML IV SOLN
500.0000 [IU] | INTRAVENOUS | Status: AC | PRN
  Administered 2019-01-12: 500 [IU]

## 2019-01-12 MED ORDER — ALTEPLASE 2 MG IJ SOLR: 2.0000 mg | Freq: Once | INTRAMUSCULAR | Status: DC

## 2019-01-12 NOTE — Progress Notes (Signed)
Pt here today for TPA to dwell to [facility].  Iv team came, acessed the [facility], stated it flushed and had blood return.  so they flushed it with normal saline and heparin and I called and left Ebony CMA at [facility] a message as to what they did and that there was no need for TPA

## 2019-01-13 ENCOUNTER — Other Ambulatory Visit: Payer: Self-pay

## 2019-01-13 DIAGNOSIS — N201 Calculus of ureter: Secondary | ICD-10-CM

## 2019-02-08 ENCOUNTER — Encounter (HOSPITAL_COMMUNITY): Payer: Self-pay

## 2019-02-08 ENCOUNTER — Encounter (HOSPITAL_COMMUNITY)
Admission: RE | Admit: 2019-02-08 | Discharge: 2019-02-08 | Disposition: A | Discharge status: Home / Self Care | Payer: Medicare Other | Source: Ambulatory Visit | Attending: Nephrology | Admitting: Nephrology

## 2019-02-08 ENCOUNTER — Other Ambulatory Visit: Payer: Self-pay

## 2019-02-08 DIAGNOSIS — D631 Anemia in chronic kidney disease: Secondary | ICD-10-CM | POA: Diagnosis not present

## 2019-02-08 MED ORDER — SODIUM CHLORIDE 0.9 % IV SOLN: Freq: Once | INTRAVENOUS | Status: AC

## 2019-02-08 MED ORDER — SODIUM CHLORIDE 0.9 % IV SOLN
510.0000 mg | Freq: Once | INTRAVENOUS | Status: AC
  Administered 2019-02-08: 510 mg via INTRAVENOUS
  Filled 2019-02-08: qty 510

## 2019-02-15 ENCOUNTER — Encounter (HOSPITAL_COMMUNITY): Payer: Self-pay

## 2019-02-15 ENCOUNTER — Encounter (HOSPITAL_COMMUNITY)
Admission: RE | Admit: 2019-02-15 | Discharge: 2019-02-15 | Disposition: A | Discharge status: Home / Self Care | Payer: Medicare Other | Source: Ambulatory Visit | Attending: Nephrology | Admitting: Nephrology

## 2019-02-15 ENCOUNTER — Other Ambulatory Visit: Payer: Self-pay

## 2019-02-15 DIAGNOSIS — D631 Anemia in chronic kidney disease: Secondary | ICD-10-CM | POA: Diagnosis not present

## 2019-02-15 MED ORDER — HEPARIN SOD (PORK) LOCK FLUSH 100 UNIT/ML IV SOLN
500.0000 [IU] | Freq: Once | INTRAVENOUS | Status: AC
  Administered 2019-02-15: 500 [IU] via INTRAVENOUS

## 2019-02-15 MED ORDER — SODIUM CHLORIDE 0.9 % IV SOLN: Freq: Once | INTRAVENOUS | Status: AC

## 2019-02-15 MED ORDER — SODIUM CHLORIDE 0.9 % IV SOLN
510.0000 mg | Freq: Once | INTRAVENOUS | Status: AC
  Administered 2019-02-15: 11:00:00 510 mg via INTRAVENOUS
  Filled 2019-02-15: qty 17

## 2019-02-24 ENCOUNTER — Ambulatory Visit (HOSPITAL_COMMUNITY)
Admission: RE | Admit: 2019-02-24 | Discharge: 2019-02-24 | Disposition: A | Discharge status: Home / Self Care | Payer: Medicare Other | Source: Ambulatory Visit | Attending: Urology | Admitting: Urology

## 2019-02-24 ENCOUNTER — Other Ambulatory Visit: Payer: Self-pay

## 2019-02-24 DIAGNOSIS — N201 Calculus of ureter: Secondary | ICD-10-CM

## 2019-04-08 ENCOUNTER — Emergency Department (HOSPITAL_COMMUNITY): Payer: Medicare Other

## 2019-04-08 ENCOUNTER — Emergency Department (HOSPITAL_COMMUNITY)
Admission: EM | Admit: 2019-04-08 | Discharge: 2019-04-08 | Disposition: A | Discharge status: Home / Self Care | Payer: Medicare Other | Attending: Emergency Medicine | Admitting: Emergency Medicine

## 2019-04-08 ENCOUNTER — Other Ambulatory Visit: Payer: Self-pay

## 2019-04-08 DIAGNOSIS — N183 Chronic kidney disease, stage 3 unspecified: Secondary | ICD-10-CM | POA: Insufficient documentation

## 2019-04-08 DIAGNOSIS — R600 Localized edema: Secondary | ICD-10-CM

## 2019-04-08 DIAGNOSIS — Z79899 Other long term (current) drug therapy: Secondary | ICD-10-CM | POA: Diagnosis not present

## 2019-04-08 DIAGNOSIS — E039 Hypothyroidism, unspecified: Secondary | ICD-10-CM | POA: Diagnosis not present

## 2019-04-08 DIAGNOSIS — Z7984 Long term (current) use of oral hypoglycemic drugs: Secondary | ICD-10-CM | POA: Insufficient documentation

## 2019-04-08 DIAGNOSIS — R2243 Localized swelling, mass and lump, lower limb, bilateral: Secondary | ICD-10-CM | POA: Diagnosis present

## 2019-04-08 DIAGNOSIS — I130 Hypertensive heart and chronic kidney disease with heart failure and stage 1 through stage 4 chronic kidney disease, or unspecified chronic kidney disease: Secondary | ICD-10-CM | POA: Insufficient documentation

## 2019-04-08 DIAGNOSIS — R0602 Shortness of breath: Secondary | ICD-10-CM | POA: Diagnosis not present

## 2019-04-08 DIAGNOSIS — Z7982 Long term (current) use of aspirin: Secondary | ICD-10-CM | POA: Insufficient documentation

## 2019-04-08 DIAGNOSIS — E1122 Type 2 diabetes mellitus with diabetic chronic kidney disease: Secondary | ICD-10-CM | POA: Insufficient documentation

## 2019-04-08 DIAGNOSIS — I5032 Chronic diastolic (congestive) heart failure: Secondary | ICD-10-CM | POA: Diagnosis not present

## 2019-04-08 LAB — BASIC METABOLIC PANEL
Anion gap: 7 (ref 5–15)
BUN: 51 mg/dL — ABNORMAL HIGH (ref 8–23)
CO2: 29 mmol/L (ref 22–32)
Calcium: 8.4 mg/dL — ABNORMAL LOW (ref 8.9–10.3)
Chloride: 106 mmol/L (ref 98–111)
Creatinine, Ser: 1.85 mg/dL — ABNORMAL HIGH (ref 0.44–1.00)
GFR calc Af Amer: 32 mL/min — ABNORMAL LOW (ref >60)
GFR calc non Af Amer: 28 mL/min — ABNORMAL LOW (ref >60)
Glucose, Bld: 104 mg/dL — ABNORMAL HIGH (ref 70–99)
Potassium: 3.4 mmol/L — ABNORMAL LOW (ref 3.5–5.1)
Sodium: 142 mmol/L (ref 135–145)

## 2019-04-08 LAB — CBC WITH DIFFERENTIAL/PLATELET
Abs Immature Granulocytes: 0.03 10*3/uL (ref 0.00–0.07)
Basophils Absolute: 0 10*3/uL (ref 0.0–0.1)
Basophils Relative: 0 %
Eosinophils Absolute: 0 10*3/uL (ref 0.0–0.5)
Eosinophils Relative: 0 %
HCT: 38.4 % (ref 36.0–46.0)
Hemoglobin: 12.2 g/dL (ref 12.0–15.0)
Immature Granulocytes: 1 %
Lymphocytes Relative: 21 %
Lymphs Abs: 1.3 10*3/uL (ref 0.7–4.0)
MCH: 30.4 pg (ref 26.0–34.0)
MCHC: 31.8 g/dL (ref 30.0–36.0)
MCV: 95.8 fL (ref 80.0–100.0)
Monocytes Absolute: 0.5 10*3/uL (ref 0.1–1.0)
Monocytes Relative: 9 %
Neutro Abs: 4.4 10*3/uL (ref 1.7–7.7)
Neutrophils Relative %: 69 %
Platelets: 187 10*3/uL (ref 150–400)
RBC: 4.01 MIL/uL (ref 3.87–5.11)
RDW: 13.8 % (ref 11.5–15.5)
WBC: 6.3 10*3/uL (ref 4.0–10.5)
nRBC: 0 % (ref 0.0–0.2)

## 2019-04-08 LAB — BRAIN NATRIURETIC PEPTIDE: B Natriuretic Peptide: 56 pg/mL (ref 0.0–100.0)

## 2019-04-08 MED ORDER — TORSEMIDE 20 MG PO TABS: 20.0000 mg | ORAL_TABLET | Freq: Three times a day (TID) | ORAL | 0 refills | Status: AC

## 2019-04-08 MED ORDER — TORSEMIDE 20 MG PO TABS
20.0000 mg | ORAL_TABLET | Freq: Every day | ORAL | Status: DC
  Administered 2019-04-08: 20 mg via ORAL
  Filled 2019-04-08 (×3): qty 1

## 2019-04-08 NOTE — Discharge Instructions (Addendum)
You were seen today for leg swelling. Your workup was reassuring. We will increase your Demadex to 3 times a day for 4-5 days. Follow up with your regular doctor. Thank you for allowing me to care for you today. Please return to the emergency department if you have new or worsening symptoms. Take your medications as instructed.

## 2019-04-08 NOTE — ED Triage Notes (Signed)
Pt  Has history of CHF. Pt has excess fluids in her legs and right arm and wanted her evaluated. Pt is from home.   VSS.

## 2019-04-08 NOTE — ED Notes (Signed)
Lilia Pro, [facility] notified of need for demadex tab from pharmacy upstairs.

## 2019-04-08 NOTE — ED Provider Notes (Signed)
[facility] Provider Note   CSN: [id number] Arrival date & time: 04/08/19  1613     History Chief Complaint  Patient presents with  . Congestive Heart Failure    Rachel Morgan is a 69 y.o. female.  Patient is a 69 y/o f with past medical history of cerebral palsy,, mental retardation, hypertension, GERD, gallstones, diabetes, anxiety, hypothyroidism presenting to the emergency department with her guardian for complaints of lower and upper extremity swelling over the last 2 days.  The caregiver states that she was concerned for worsening CHF given the edema.  Reports that the patient is ambulatory with a walker at baseline.  Reports she seemed slightly more short of breath with a cough over the last 2 days.  She is eating drinking normally and denies any vomiting, diarrhea, fever, chills.  No specific complaints from the patient.  The caregiver states that her normal weight is around 315 pounds.  However, our weight here that we obtained today was 260 pounds.  The caregiver states that she also has a kidney stone that she is being treated for and gallstones.        Past Medical History:  Diagnosis Date  . Anxiety   . Calculus of gallbladder   . Cerebral palsy ([place])   . Chronic kidney disease   . Diabetes mellitus   . Gall stones   . GERD (gastroesophageal reflux disease)   . High cholesterol   . History of kidney stones   . Hypertension   . Hypothyroidism   . Kidney stones   . MR (mental retardation)   . Pyelonephritis   . Sepsis [facility])     Patient Active Problem List   Diagnosis Date Noted  . Acute renal failure superimposed on stage 3 chronic kidney disease ([place]) 06/03/2018  . Acute on chronic diastolic CHF (congestive heart failure) ([place]) 06/03/2018  . Acute respiratory failure with hypoxia ([place]) 06/03/2018  . Hypokalemia 06/03/2018  . Morbid obesity with BMI of 50.0-59.9, adult ([place]) 06/03/2018  . Acute exacerbation of CHF (congestive heart  failure) ([place]) 06/01/2018  . Hypoxia 06/01/2018  . Pneumonia 05/09/2018  . Acute respiratory failure ([place]) 05/07/2018  . Acute metabolic encephalopathy 71/06/2692  . Hypomagnesemia 04/21/2018  . UTI (urinary tract infection) 04/20/2018  . PICC (peripherally inserted central catheter) in place   . Urinary tract infection without hematuria   . Hydronephrosis with renal and ureteral calculus obstruction 01/21/2016  . Hyperkalemia 01/21/2016  . Palliative care encounter   . DNR (do not resuscitate) discussion   . Encephalopathy 10/17/2015  . Goals of care, counseling/discussion 10/17/2015  . Nephrolithiasis 09/27/2015  . Cerebral palsy ([place]) 09/27/2015  . Sepsis ([place]) 09/26/2015  . AKI (acute kidney injury) ([place]) 09/26/2015  . Fever   . Urinary tract infectious disease   . Dilated cbd, acquired 09/14/2015  . Hypothyroidism 09/14/2015  . Hypertension 09/14/2015  . Depression with anxiety 09/14/2015  . UPJ obstruction, acquired 09/14/2015  . CKD (chronic kidney disease), stage III ([place]) 09/14/2015  . GERD (gastroesophageal reflux disease) 09/14/2015  . Calculus of gallbladder without cholecystitis without obstruction   . Abdominal pain, right upper quadrant 02/11/2011  . Acute pyelonephritis 02/11/2011  . Agoraphobia 02/11/2011  . Acute renal failure ([place]) 02/11/2011  . Hyponatremia 02/11/2011  . Dehydration 02/11/2011    Past Surgical History:  Procedure Laterality Date  . CYSTOSCOPY W/ URETERAL STENT PLACEMENT Bilateral 09/26/2015   Procedure: CYSTOSCOPY WITH Bilateral  RETROGRADE PYELOGRAM/URETERAL STENT PLACEMENT;  Surgeon: Hubbard Robinson  Tresa Moore, MD;  Location: [facility];  Service: Urology;  Laterality: Bilateral;  . CYSTOSCOPY W/ URETERAL STENT PLACEMENT Right 04/22/2018   Procedure: CYSTOSCOPY WITH RETROGRADE PYELOGRAM/URETERAL STENT PLACEMENT;  Surgeon: Cleon Gustin, MD;  Location: [facility];  Service: Urology;  Laterality: Right;  . CYSTOSCOPY W/ URETERAL STENT REMOVAL Left  11/10/2015   Procedure: CYSTOSCOPY WITH STENT REMOVAL;  Surgeon: Cleon Gustin, MD;  Location: [facility];  Service: Urology;  Laterality: Left;  . CYSTOSCOPY WITH RETROGRADE PYELOGRAM, URETEROSCOPY AND STENT PLACEMENT Right 06/24/2018   Procedure: CYSTOSCOPY WITH RIGHT RETROGRADE PYELOGRAM, RIGHT URETEROSCOPY AND RIGHT URETERAL STENT EXCHANGE;  Surgeon: Cleon Gustin, MD;  Location: [facility];  Service: Urology;  Laterality: Right;  . CYSTOSCOPY WITH STENT PLACEMENT Right 01/21/2016   Procedure: CYSTOSCOPY WITH STENT PLACEMENT;  Surgeon: Franchot Gallo, MD;  Location: [facility];  Service: Urology;  Laterality: Right;  . CYSTOSCOPY/RETROGRADE/URETEROSCOPY/STONE EXTRACTION WITH BASKET Left 10/31/2015   Procedure: CYSTOSCOPY/ BILATERAL URETEROSCOPY/BILATERAL STONE EXTRACTION/ LEFT STENT PLACEMENT;  Surgeon: Irine Seal, MD;  Location: [facility];  Service: Urology;  Laterality: Left;  . CYSTOSCOPY/URETEROSCOPY/HOLMIUM LASER/STENT PLACEMENT Right 02/27/2016   Procedure: CYSTOSCOPY/RIGHT URETEROSCOPY/HOLMIUM LASER/STENT PLACEMENT/STENT REMOVAL;  Surgeon: Irine Seal, MD;  Location: [facility];  Service: Urology;  Laterality: Right;  . HOLMIUM LASER APPLICATION N/A 27/03/5007   Procedure: HOLMIUM LASER APPLICATION;  Surgeon: Irine Seal, MD;  Location: [facility];  Service: Urology;  Laterality: N/A;  . HOLMIUM LASER APPLICATION Right 3/81/8299   Procedure: HOLMIUM LASER APPLICATION;  Surgeon: Cleon Gustin, MD;  Location: [facility];  Service: Urology;  Laterality: Right;  . IR CV LINE INJECTION  05/06/2018  . IR FLUORO GUIDE PORT INSERTION RIGHT  04/10/2016  . IR US GUIDE VASC ACCESS RIGHT  04/10/2016     OB History   No obstetric history on file.     Family History  Family history unknown: Yes    Social History   Tobacco Use  . Smoking status: Never Smoker  . Smokeless tobacco: Never Used  Substance Use Topics  . Alcohol use: No  . Drug use: No    Home Medications Prior to Admission medications    Medication Sig Start Date End Date Taking? Authorizing Provider  acetaminophen (TYLENOL) 325 MG tablet Take 2 tablets (650 mg total) by mouth every 6 (six) hours as needed for mild pain (or Fever >/= 101). 06/08/18  Yes Emokpae, Courage, MD  ALPRAZolam Duanne Moron) 1 MG tablet Take 1 tablet (1 mg total) by mouth 2 (two) times daily as needed for anxiety. Patient taking differently: Take 1 mg by mouth at bedtime. *May take one tablet twice daily as needed for anxiety 06/17/18   Roxan Hockey, MD  aspirin EC 81 MG tablet Take 1 tablet (81 mg total) by mouth daily with breakfast. 06/08/18   Emokpae, Courage, MD  busPIRone (BUSPAR) 5 MG tablet Take 5 mg by mouth 2 (two) times daily. 10/23/18   [provider]  chlorproMAZINE (THORAZINE) 25 MG tablet Take 25 mg by mouth 2 (two) times daily as needed for nausea or vomiting.  07/09/18   [provider]  escitalopram (LEXAPRO) 20 MG tablet Take 1 tablet (20 mg total) by mouth daily. 06/17/18   Roxan Hockey, MD  glucose blood test strip Use as instructed--- Dx R73.9 Patient not taking: Reported on 10/14/2018 06/17/18   Roxan Hockey, MD  levothyroxine (SYNTHROID, LEVOTHROID) 50 MCG tablet Take 50 mcg by mouth daily before breakfast.    [provider]  midodrine (  PROAMATINE) 10 MG tablet Take 1 tablet (10 mg total) by mouth 3 (three) times daily with meals. 06/17/18   Emokpae, Courage, MD  OLANZapine (ZYPREXA) 5 MG tablet Take 5 mg by mouth at bedtime. 10/23/18   [provider]  omeprazole (PRILOSEC) 20 MG capsule Take 20 mg by mouth daily before breakfast.     [provider]  oxybutynin (DITROPAN-XL) 10 MG 24 hr tablet Take 10 mg by mouth every morning.     [provider]  potassium chloride SA (K-DUR) 20 MEQ tablet Take 2 tablets (40 mEq total) by mouth daily. 06/17/18   Emokpae, Courage, MD  senna-docusate (SENOKOT-S) 8.6-50 MG tablet Take 1 tablet by mouth 2 (two) times daily. Patient taking  differently: Take 1 tablet by mouth 2 (two) times daily as needed.  01/26/16   Raiford Noble Latif, DO  sodium bicarbonate 650 MG tablet Take 1 tablet by mouth daily. 11/23/18   [provider]  sulfamethoxazole-trimethoprim (BACTRIM) 400-80 MG tablet Take 1 tablet by mouth 2 (two) times daily. 5 day course starting on 11/09/2018 11/09/18   [provider]  torsemide (DEMADEX) 20 MG tablet Take 1 tablet (20 mg total) by mouth in the morning, at noon, and at bedtime for 5 days. For Fluid 04/08/19 04/13/19  Madilyn Hook A, PA-C  zolpidem (AMBIEN) 10 MG tablet Take 10 mg by mouth at bedtime. 10/23/18   [provider]    Allergies    Macrobid [nitrofurantoin monohyd macro], Penicillins, and Cefdinir  Review of Systems   Review of Systems  Unable to perform ROS: Patient nonverbal    Physical Exam Updated Vital Signs BP (!) 110/41 (BP Location: Right Wrist)   Pulse 68   Temp 97.7 F (36.5 C) (Oral)   Resp 18   Ht 5\' 2"  (1.575 m)   Wt (!) 149.7 kg   SpO2 96%   BMI 60.36 kg/m   Physical Exam Constitutional:      General: She is not in acute distress.    Appearance: She is obese. She is not ill-appearing, toxic-appearing or diaphoretic.  HENT:     Head: Normocephalic and atraumatic.     Mouth/Throat:     Mouth: Mucous membranes are moist.  Eyes:     Conjunctiva/sclera: Conjunctivae normal.  Cardiovascular:     Rate and Rhythm: Normal rate and regular rhythm.  Pulmonary:     Effort: Pulmonary effort is normal.     Breath sounds: Rhonchi present.  Abdominal:     General: There is no distension.     Tenderness: There is no right CVA tenderness, left CVA tenderness, guarding or rebound.  Musculoskeletal:     Comments: Patient is obese. She has very large UE and LE but no overt pitting edema. Patient does not express pain with palpation. Normal distal pulses.   Neurological:     Mental Status: She is alert. Mental status is at baseline.     ED Results /  Procedures / Treatments   Labs (all labs ordered are listed, but only abnormal results are displayed) Labs Reviewed  BASIC METABOLIC PANEL - Abnormal; Notable for the following components:      Result Value   Potassium 3.4 (*)    Glucose, Bld 104 (*)    BUN 51 (*)    Creatinine, Ser 1.85 (*)    Calcium 8.4 (*)    GFR calc non Af Amer 28 (*)    GFR calc Af Amer 32 (*)  All other components within normal limits  BRAIN NATRIURETIC PEPTIDE  CBC WITH DIFFERENTIAL/PLATELET    EKG None  Radiology DG Chest Port 1 View  Result Date: 04/08/2019 CLINICAL DATA:  Edema. EXAM: PORTABLE CHEST 1 VIEW COMPARISON:  Radiograph 12/11/2018 FINDINGS: Right chest port with tip in the SVC, unchanged. Stable cardiomegaly and mediastinal contours. No pulmonary edema. No focal airspace disease. No pleural fluid. No pneumothorax. Soft tissue attenuation from habitus limits detailed assessment. No acute osseous abnormalities are seen. IMPRESSION: Stable cardiomegaly without congestive failure. Electronically Signed   By: Keith Rake M.D.   On: 04/08/2019 17:25    Procedures Procedures (including critical care time)  Medications Ordered in ED Medications  torsemide (DEMADEX) tablet 20 mg (has no administration in time range)    ED Course  I have reviewed the triage vital signs and the nursing notes.  Pertinent labs & imaging results that were available during my care of the patient were reviewed by me and considered in my medical decision making (see chart for details).  Clinical Course as of Apr 07 1928  Thu Apr 08, 2019  1754 Patient with history of CHF, brought in by caregiver for increased lower extremity swelling.  Patient had a work-up today which revealed a reassuring BNP as well as other reassuring labs.  Plan will be to increase her Demadex over the next 4 days and follow-up with her primary care doctor.  Her chest x-ray was also reassuring.  Patient seen and examined by Dr. Roderic Palau as well  who agrees with the plan.  Advised on return precautions.   [KM]    Clinical Course User Index [KM] Kristine Royal   MDM Rules/Calculators/A&P                      Based on review of vitals, medical screening exam, lab work and/or imaging, there does not appear to be an acute, emergent etiology for the patient's symptoms. Counseled pt on good return precautions and encouraged both PCP and ED follow-up as needed.  Prior to discharge, I also discussed incidental imaging findings with patient in detail and advised appropriate, recommended follow-up in detail.  Clinical Impression: No diagnosis found.  Disposition: Discharge  Prior to providing a prescription for a controlled substance, I independently reviewed the patient's recent prescription history on the [place]. The patient had no recent or regular prescriptions and was deemed appropriate for a brief, less than 3 day prescription of narcotic for acute analgesia.  This note was prepared with assistance of Systems analyst. Occasional wrong-word or sound-a-like substitutions may have occurred due to the inherent limitations of voice recognition software.  Final Clinical Impression(s) / ED Diagnoses Final diagnoses:  None    Rx / DC Orders ED Discharge Orders         Ordered    torsemide (DEMADEX) 20 MG tablet  3 times daily     04/08/19 1930           Kristine Royal 04/08/19 Reuben Likes, MD 04/09/19 1031

## 2019-05-05 ENCOUNTER — Ambulatory Visit (INDEPENDENT_AMBULATORY_CARE_PROVIDER_SITE_OTHER): Payer: Medicare Other | Admitting: Urology

## 2019-05-05 ENCOUNTER — Other Ambulatory Visit: Payer: Self-pay

## 2019-05-05 ENCOUNTER — Encounter: Payer: Self-pay | Admitting: Urology

## 2019-05-05 VITALS — BP 108/58 | HR 54 | Temp 97.7°F | Ht <= 58 in | Wt 330.0 lb

## 2019-05-05 DIAGNOSIS — N210 Calculus in bladder: Secondary | ICD-10-CM | POA: Insufficient documentation

## 2019-05-05 DIAGNOSIS — N200 Calculus of kidney: Secondary | ICD-10-CM | POA: Diagnosis not present

## 2019-05-05 NOTE — Patient Instructions (Signed)

## 2019-05-05 NOTE — Progress Notes (Signed)
Urological Symptom Review  Patient is experiencing the following symptoms: Rt. abd pain left groin pain   Review of Systems  Gastrointestinal (upper)  : Negative for upper GI symptoms  Gastrointestinal (lower) : Negative for lower GI symptoms  Constitutional : Negative for symptoms  Skin: Negative for skin symptoms  Eyes: Negative for eye symptoms  Ear/Nose/Throat : Negative for Ear/Nose/Throat symptoms  Hematologic/Lymphatic: Negative for Hematologic/Lymphatic symptoms  Cardiovascular : Negative for cardiovascular symptoms  Respiratory : Negative for respiratory symptoms  Endocrine: Negative for endocrine symptoms  Musculoskeletal: Negative for musculoskeletal symptoms  Neurological: Negative for neurological symptoms  Psychologic: Negative for psychiatric symptoms

## 2019-05-05 NOTE — Progress Notes (Signed)
05/05/2019 1:52 PM   Rachel Morgan 27-Nov-1950 [id number]  Referring provider: [facility] [place],  [place] [address]  Nephrolithiasis  HPI: Rachel Morgan is a 69yo here for followup for nephrolithiasis. No stone events since last visit. She underwent renal US on 2/17 which showed left renal claculi, no hydronephrosis and a 3-9mm bladder calculus. No gross hematuria. No recent UTIs. No dysuria    PMH: Past Medical History:  Diagnosis Date  . Anxiety   . Calculus of gallbladder   . Cerebral palsy ([place])   . Chronic kidney disease   . Diabetes mellitus   . Gall stones   . GERD (gastroesophageal reflux disease)   . High cholesterol   . History of kidney stones   . Hypertension   . Hypothyroidism   . Kidney stones   . MR (mental retardation)   . Pyelonephritis   . Sepsis [facility])     Surgical History: Past Surgical History:  Procedure Laterality Date  . CYSTOSCOPY W/ URETERAL STENT PLACEMENT Bilateral 09/26/2015   Procedure: CYSTOSCOPY WITH Bilateral  RETROGRADE PYELOGRAM/URETERAL STENT PLACEMENT;  Surgeon: [name], MD;  Location: [facility];  Service: Urology;  Laterality: Bilateral;  . CYSTOSCOPY W/ URETERAL STENT PLACEMENT Right 04/22/2018   Procedure: CYSTOSCOPY WITH RETROGRADE PYELOGRAM/URETERAL STENT PLACEMENT;  Surgeon: Cleon Gustin, MD;  Location: [facility];  Service: Urology;  Laterality: Right;  . CYSTOSCOPY W/ URETERAL STENT REMOVAL Left 11/10/2015   Procedure: CYSTOSCOPY WITH STENT REMOVAL;  Surgeon: Cleon Gustin, MD;  Location: [facility];  Service: Urology;  Laterality: Left;  . CYSTOSCOPY WITH RETROGRADE PYELOGRAM, URETEROSCOPY AND STENT PLACEMENT Right 06/24/2018   Procedure: CYSTOSCOPY WITH RIGHT RETROGRADE PYELOGRAM, RIGHT URETEROSCOPY AND RIGHT URETERAL STENT EXCHANGE;  Surgeon: Cleon Gustin, MD;  Location: [facility];  Service: Urology;  Laterality: Right;  . CYSTOSCOPY WITH STENT PLACEMENT Right 01/21/2016   Procedure: CYSTOSCOPY WITH STENT PLACEMENT;  Surgeon: Franchot Gallo, MD;  Location: [facility];  Service: Urology;  Laterality: Right;  . CYSTOSCOPY/RETROGRADE/URETEROSCOPY/STONE EXTRACTION WITH BASKET Left 10/31/2015   Procedure: CYSTOSCOPY/ BILATERAL URETEROSCOPY/BILATERAL STONE EXTRACTION/ LEFT STENT PLACEMENT;  Surgeon: Irine Seal, MD;  Location: [facility];  Service: Urology;  Laterality: Left;  . CYSTOSCOPY/URETEROSCOPY/HOLMIUM LASER/STENT PLACEMENT Right 02/27/2016   Procedure: CYSTOSCOPY/RIGHT URETEROSCOPY/HOLMIUM LASER/STENT PLACEMENT/STENT REMOVAL;  Surgeon: Irine Seal, MD;  Location: [facility];  Service: Urology;  Laterality: Right;  . HOLMIUM LASER APPLICATION N/A 95/09/3265   Procedure: HOLMIUM LASER APPLICATION;  Surgeon: Irine Seal, MD;  Location: [facility];  Service: Urology;  Laterality: N/A;  . HOLMIUM LASER APPLICATION Right 01/31/5807   Procedure: HOLMIUM LASER APPLICATION;  Surgeon: Cleon Gustin, MD;  Location: [facility];  Service: Urology;  Laterality: Right;  . IR CV LINE INJECTION  05/06/2018  . IR FLUORO GUIDE PORT INSERTION RIGHT  04/10/2016  . IR US GUIDE VASC ACCESS RIGHT  04/10/2016    Home Medications:  Allergies as of 05/05/2019      Reactions   Macrobid [nitrofurantoin Monohyd Macro] Hives, Swelling   Penicillins Swelling, Rash   Has patient had a PCN reaction causing immediate rash, facial/tongue/throat swelling, SOB or lightheadedness with hypotension: Yes Has patient had a PCN reaction causing severe rash involving mucus membranes or skin necrosis: Yes Has patient had a PCN reaction that required hospitalization: no Has patient had a PCN reaction occurring within the last 10 years: no If all of the above answers are "NO", then may proceed with Cephalosporin use. Patient tolerated  rocephin and ertapenem in the past   Cefdinir    Hives       Medication List       Accurate as of May 05, 2019  1:52 PM. If you have any questions, ask your nurse or doctor.          STOP taking these medications   sulfamethoxazole-trimethoprim 400-80 MG tablet Commonly known as: BACTRIM Stopped by: [name], MD     TAKE these medications   acetaminophen 325 MG tablet Commonly known as: TYLENOL Take 2 tablets (650 mg total) by mouth every 6 (six) hours as needed for mild pain (or Fever >/= 101).   ALPRAZolam 1 MG tablet Commonly known as: XANAX Take 1 tablet (1 mg total) by mouth 2 (two) times daily as needed for anxiety. What changed:   when to take this  additional instructions   ALPRAZolam 0.5 MG tablet Commonly known as: XANAX Take 0.5 mg by mouth daily as needed. What changed: Another medication with the same name was changed. Make sure you understand how and when to take each.   aspirin EC 81 MG tablet Take 1 tablet (81 mg total) by mouth daily with breakfast.   busPIRone 5 MG tablet Commonly known as: BUSPAR Take 5 mg by mouth 2 (two) times daily.   chlorproMAZINE 25 MG tablet Commonly known as: THORAZINE Take 25 mg by mouth 2 (two) times daily as needed for nausea or vomiting.   escitalopram 20 MG tablet Commonly known as: LEXAPRO Take 1 tablet (20 mg total) by mouth daily.   glucose blood test strip Use as instructed--- Dx R73.9   ipratropium-albuterol 0.5-2.5 (3) MG/3ML Soln Commonly known as: DUONEB   levothyroxine 50 MCG tablet Commonly known as: SYNTHROID Take 50 mcg by mouth daily before breakfast.   midodrine 10 MG tablet Commonly known as: PROAMATINE Take 1 tablet (10 mg total) by mouth 3 (three) times daily with meals.   Mucinex 600 MG 12 hr tablet Generic drug: guaiFENesin Take 600 mg by mouth 2 (two) times daily.   nystatin 100000 UNIT/ML suspension Commonly known as: MYCOSTATIN   nystatin cream Commonly known as: MYCOSTATIN   OLANZapine 5 MG tablet Commonly known as: ZYPREXA Take 5 mg by mouth at bedtime.   OLANZapine 20 MG tablet Commonly known as: ZYPREXA Take 20 mg by mouth at bedtime.    omeprazole 20 MG capsule Commonly known as: PRILOSEC Take 20 mg by mouth daily before breakfast.   oxybutynin 10 MG 24 hr tablet Commonly known as: DITROPAN-XL Take 10 mg by mouth every morning.   potassium chloride SA 20 MEQ tablet Commonly known as: KLOR-CON Take 2 tablets (40 mEq total) by mouth daily.   senna-docusate 8.6-50 MG tablet Commonly known as: Senokot-S Take 1 tablet by mouth 2 (two) times daily. What changed:   when to take this  reasons to take this   sodium bicarbonate 650 MG tablet Take 1 tablet by mouth daily.   torsemide 20 MG tablet Commonly known as: Demadex Take 1 tablet (20 mg total) by mouth in the morning, at noon, and at bedtime for 5 days. For Fluid   zolpidem 10 MG tablet Commonly known as: AMBIEN Take 10 mg by mouth at bedtime.       Allergies:  Allergies  Allergen Reactions  . Macrobid [Nitrofurantoin Monohyd Macro] Hives and Swelling  . Penicillins Swelling and Rash    Has patient had a PCN reaction causing immediate rash, facial/tongue/throat swelling, SOB or lightheadedness  with hypotension: Yes Has patient had a PCN reaction causing severe rash involving mucus membranes or skin necrosis: Yes Has patient had a PCN reaction that required hospitalization: no Has patient had a PCN reaction occurring within the last 10 years: no If all of the above answers are "NO", then may proceed with Cephalosporin use. Patient tolerated rocephin and ertapenem in the past  . Cefdinir     Hives     Family History: Family History  Family history unknown: Yes    Social History:  reports that she has never smoked. She has never used smokeless tobacco. She reports that she does not drink alcohol or use drugs.  ROS: All other review of systems were reviewed and are negative except what is noted above in HPI  Physical Exam: BP (!) 108/58   Pulse (!) 54   Temp 97.7 F (36.5 C)   Ht 4\' 7"  (1.397 m)   Wt (!) 330 lb (149.7 kg)   BMI 76.70  kg/m   Constitutional:  Alert and oriented, No acute distress. HEENT: [place] AT, moist mucus membranes.  Trachea midline, no masses. Cardiovascular: No clubbing, cyanosis, or edema. Respiratory: Normal respiratory effort, no increased work of breathing. GI: Abdomen is soft, nontender, nondistended, no abdominal masses GU: No CVA tenderness.  Lymph: No cervical or inguinal lymphadenopathy. Skin: No rashes, bruises or suspicious lesions. Neurologic: Grossly intact, no focal deficits, moving all 4 extremities. Psychiatric: Normal mood and affect.  Laboratory Data: Lab Results  Component Value Date   WBC 6.3 04/08/2019   HGB 12.2 04/08/2019   HCT 38.4 04/08/2019   MCV 95.8 04/08/2019   PLT 187 04/08/2019    Lab Results  Component Value Date   CREATININE 1.85 (H) 04/08/2019    No results found for: PSA  No results found for: TESTOSTERONE  Lab Results  Component Value Date   HGBA1C 5.3 02/27/2016    Urinalysis    Component Value Date/Time   COLORURINE YELLOW 11/11/2018 1950   APPEARANCEUR CLEAR 11/11/2018 1952   LABSPEC 1.014 11/11/2018 1952   PHURINE 5.0 11/11/2018 1952   GLUCOSEU NEGATIVE 11/11/2018 1952   HGBUR NEGATIVE 11/11/2018 1952   BILIRUBINUR NEGATIVE 11/11/2018 [place] NEGATIVE 11/11/2018 1952   PROTEINUR NEGATIVE 11/11/2018 1952   UROBILINOGEN 1.0 02/11/2011 2035   NITRITE NEGATIVE 11/11/2018 1952   LEUKOCYTESUR NEGATIVE 11/11/2018 1952    Lab Results  Component Value Date   BACTERIA RARE (A) 10/14/2018    Pertinent Imaging: Renal US 2/17: images reviewed and discussed with patient and family No results found for this or any previous visit. No results found for this or any previous visit. No results found for this or any previous visit. No results found for this or any previous visit. Results for orders placed during the hospital encounter of 02/24/19  Ultrasound renal complete   Narrative CLINICAL DATA:  Follow-up renal  stones  EXAM: RENAL / URINARY TRACT ULTRASOUND COMPLETE  COMPARISON:  11/11/2018  FINDINGS: Right Kidney:  Renal measurements: 7.2 x 4.2 x 4.9 cm. = volume: 80 mL. The acoustical window is limited. No obstructive changes are seen. The previously noted lower pole stones are not as well appreciated. There is suggestion of upper pole stones which were not seen on the prior exam.  Left Kidney:  Renal measurements: 8.3 x 5.0 x 4.3 cm = volume: 95 mL. Mild cortical thinning is noted. 8 mm lower pole stone is again noted and stable.  Bladder:  Bladder is partially  distended. Some dependent density is noted which could be related to bladder calculus. This was not seen on the prior CT examination.  Other:  None.  IMPRESSION: Stable left renal calculi.  Findings suggestive of bladder calculus.  CT urogram may be helpful.  Suggestion of upper pole calculi on the right although the calculi seen on the prior exam or lower pole in location these were not well visualized due to a poor acoustical window.   Electronically Signed   By: Inez Catalina M.D.   On: 02/24/2019 13:05    No results found for this or any previous visit. No results found for this or any previous visit. Results for orders placed during the hospital encounter of 11/11/18  CT Renal Stone Study   Narrative CLINICAL DATA:  Right flank pain. History of kidney stones.  EXAM: CT ABDOMEN AND PELVIS WITHOUT CONTRAST  TECHNIQUE: Multidetector CT imaging of the abdomen and pelvis was performed following the standard protocol without IV contrast.  COMPARISON:  10/14/2018  FINDINGS: Lower chest: Mild respiratory motion artifact through the lung bases. No consolidation or pleural effusion. Three-vessel coronary artery atherosclerosis.  Hepatobiliary: No focal liver abnormality is identified within limitations of noncontrast technique and motion. Multiple small stones are again seen in the gallbladder  including a stone in the region of the gallbladder neck as seen previously. No gross pericholecystic inflammation is evident. There is no evidence of significant biliary dilatation.  Pancreas: Fatty replacement of the pancreas diffusely.  Spleen: Unremarkable.  Adrenals/Urinary Tract: Unremarkable adrenal glands. Small stones in both kidneys are unchanged, measuring up to 7 mm in the left lower pole. No hydroureteronephrosis or ureteral calculi are seen. The bladder is unremarkable.  Stomach/Bowel: The stomach is nondistended. A moderate amount of stool is present in the right colon. There is mild gaseous distension of the transverse colon, and the descending and sigmoid colon are collapsed. There is no evidence of bowel obstruction. Left-sided colonic diverticulosis is noted without evidence of diverticulitis.  Vascular/Lymphatic: Minimal abdominal aortic atherosclerosis without aneurysm. No enlarged lymph nodes.  Reproductive: Unremarkable.  Other: No intraperitoneal free fluid.  Musculoskeletal: No suspicious osseous lesion. Unchanged mild L3 compression fracture. Moderately advanced lower lumbar disc and facet degeneration.  IMPRESSION: 1. No acute abnormality identified in the abdomen or pelvis. 2. Nonobstructing bilateral nephrolithiasis. 3. Cholelithiasis. 4. Colonic diverticulosis. 5. Aortic Atherosclerosis (ICD10-I70.0).   Electronically Signed   By: Logan Bores M.D.   On: 11/11/2018 18:13     Assessment & Plan:    1. Calculus of bladder -We discussed the management including medical expulsive therapy versus cystolithalopaxy and we have elected to proceed with medical expulsive therapy  2. Nephrolithiasis Observation. RTC 6 months with renal US   No follow-ups on file.  [name], MD  [facility] [place]

## 2019-05-19 ENCOUNTER — Emergency Department (HOSPITAL_COMMUNITY): Payer: Medicare Other

## 2019-05-19 ENCOUNTER — Other Ambulatory Visit: Payer: Self-pay

## 2019-05-19 ENCOUNTER — Emergency Department (HOSPITAL_COMMUNITY)
Admission: EM | Admit: 2019-05-19 | Discharge: 2019-05-19 | Disposition: A | Discharge status: Home / Self Care | Payer: Medicare Other | Attending: Emergency Medicine | Admitting: Emergency Medicine

## 2019-05-19 ENCOUNTER — Encounter (HOSPITAL_COMMUNITY): Payer: Self-pay

## 2019-05-19 DIAGNOSIS — E039 Hypothyroidism, unspecified: Secondary | ICD-10-CM | POA: Insufficient documentation

## 2019-05-19 DIAGNOSIS — Z79899 Other long term (current) drug therapy: Secondary | ICD-10-CM | POA: Insufficient documentation

## 2019-05-19 DIAGNOSIS — I130 Hypertensive heart and chronic kidney disease with heart failure and stage 1 through stage 4 chronic kidney disease, or unspecified chronic kidney disease: Secondary | ICD-10-CM | POA: Insufficient documentation

## 2019-05-19 DIAGNOSIS — G809 Cerebral palsy, unspecified: Secondary | ICD-10-CM | POA: Diagnosis not present

## 2019-05-19 DIAGNOSIS — N132 Hydronephrosis with renal and ureteral calculous obstruction: Secondary | ICD-10-CM | POA: Insufficient documentation

## 2019-05-19 DIAGNOSIS — N183 Chronic kidney disease, stage 3 unspecified: Secondary | ICD-10-CM | POA: Diagnosis not present

## 2019-05-19 DIAGNOSIS — E119 Type 2 diabetes mellitus without complications: Secondary | ICD-10-CM | POA: Insufficient documentation

## 2019-05-19 DIAGNOSIS — E1122 Type 2 diabetes mellitus with diabetic chronic kidney disease: Secondary | ICD-10-CM | POA: Diagnosis not present

## 2019-05-19 DIAGNOSIS — R338 Other retention of urine: Secondary | ICD-10-CM | POA: Diagnosis not present

## 2019-05-19 DIAGNOSIS — I5032 Chronic diastolic (congestive) heart failure: Secondary | ICD-10-CM | POA: Insufficient documentation

## 2019-05-19 DIAGNOSIS — N390 Urinary tract infection, site not specified: Secondary | ICD-10-CM | POA: Diagnosis present

## 2019-05-19 LAB — BASIC METABOLIC PANEL
Anion gap: 9 (ref 5–15)
BUN: 43 mg/dL — ABNORMAL HIGH (ref 8–23)
CO2: 28 mmol/L (ref 22–32)
Calcium: 9.2 mg/dL (ref 8.9–10.3)
Chloride: 104 mmol/L (ref 98–111)
Creatinine, Ser: 2.06 mg/dL — ABNORMAL HIGH (ref 0.44–1.00)
GFR calc Af Amer: 28 mL/min — ABNORMAL LOW (ref >60)
GFR calc non Af Amer: 24 mL/min — ABNORMAL LOW (ref >60)
Glucose, Bld: 116 mg/dL — ABNORMAL HIGH (ref 70–99)
Potassium: 5.4 mmol/L — ABNORMAL HIGH (ref 3.5–5.1)
Sodium: 141 mmol/L (ref 135–145)

## 2019-05-19 LAB — CBC WITH DIFFERENTIAL/PLATELET
Abs Immature Granulocytes: 0.02 10*3/uL (ref 0.00–0.07)
Basophils Absolute: 0 10*3/uL (ref 0.0–0.1)
Basophils Relative: 0 %
Eosinophils Absolute: 0 10*3/uL (ref 0.0–0.5)
Eosinophils Relative: 0 %
HCT: 39.7 % (ref 36.0–46.0)
Hemoglobin: 12.3 g/dL (ref 12.0–15.0)
Immature Granulocytes: 0 %
Lymphocytes Relative: 18 %
Lymphs Abs: 0.9 10*3/uL (ref 0.7–4.0)
MCH: 30.1 pg (ref 26.0–34.0)
MCHC: 31 g/dL (ref 30.0–36.0)
MCV: 97.1 fL (ref 80.0–100.0)
Monocytes Absolute: 0.4 10*3/uL (ref 0.1–1.0)
Monocytes Relative: 8 %
Neutro Abs: 3.6 10*3/uL (ref 1.7–7.7)
Neutrophils Relative %: 74 %
Platelets: 161 10*3/uL (ref 150–400)
RBC: 4.09 MIL/uL (ref 3.87–5.11)
RDW: 13.7 % (ref 11.5–15.5)
WBC: 4.9 10*3/uL (ref 4.0–10.5)
nRBC: 0 % (ref 0.0–0.2)

## 2019-05-19 LAB — URINALYSIS, ROUTINE W REFLEX MICROSCOPIC
Bilirubin Urine: NEGATIVE
Glucose, UA: NEGATIVE mg/dL
Hgb urine dipstick: NEGATIVE
Ketones, ur: NEGATIVE mg/dL
Leukocytes,Ua: NEGATIVE
Nitrite: NEGATIVE
Protein, ur: NEGATIVE mg/dL
Specific Gravity, Urine: 1.008 (ref 1.005–1.030)
pH: 6 (ref 5.0–8.0)

## 2019-05-19 LAB — I-STAT CHEM 8, ED
BUN: 39 mg/dL — ABNORMAL HIGH (ref 8–23)
Calcium, Ion: 1.25 mmol/L (ref 1.15–1.40)
Chloride: 105 mmol/L (ref 98–111)
Creatinine, Ser: 2.2 mg/dL — ABNORMAL HIGH (ref 0.44–1.00)
Glucose, Bld: 99 mg/dL (ref 70–99)
HCT: 35 % — ABNORMAL LOW (ref 36.0–46.0)
Hemoglobin: 11.9 g/dL — ABNORMAL LOW (ref 12.0–15.0)
Potassium: 5 mmol/L (ref 3.5–5.1)
Sodium: 142 mmol/L (ref 135–145)
TCO2: 32 mmol/L (ref 22–32)

## 2019-05-19 LAB — I-STAT CREATININE, ED: Creatinine, Ser: 2.1 mg/dL — ABNORMAL HIGH (ref 0.44–1.00)

## 2019-05-19 NOTE — Discharge Instructions (Addendum)
Get help right away if: You have chills or fever. You have blood in your urine. You have a catheter and: Your catheter stops draining urine. Your catheter falls out.

## 2019-05-19 NOTE — ED Provider Notes (Signed)
[facility] EMERGENCY DEPARTMENT Provider Note   CSN: [id number] Arrival date & time: 05/19/19  1428     History No chief complaint on file.   Rachel Morgan is a 69 y.o. female with a past medical history of cerebral palsy, MR, diabetes, obesity, recurrent kidney stones and urinary tract infections who comes to the emergency department for UTI.  I gathered the history from review of EMR, phone call with the patient's sister and phone call with her home clinic.  The patient's sister reports that over the past week she has been less playful, interactive and sweet natured.  She states that normally when she asked like this is because she is not feeling well or getting sick.  She states that she got a call from the clinic and was told to come to the emergency department.  I spoke with the nurse Amy at the [facility] family clinic.  She states that she has a positive urinary tract infection with many bacteria, positive nitrates and positive leukocyte esterases.  They have sent for culture but has not returned.  She reports that the sister who is her legal guardian called them today and stated that she was shivering and sweaty and they told her to come to the emergency department immediately.  Review of microscopic urine reports shows that she has a history of recurrent kidney stones and follows with Dr. 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  The patient is unable to provide any history.  HPI     Past Medical History:  Diagnosis Date  . Anxiety   . Calculus of gallbladder   . Cerebral palsy ([place])   . Chronic kidney disease   . Diabetes mellitus   . Gall stones   . GERD (gastroesophageal reflux disease)   . High cholesterol   . History of kidney stones   . Hypertension   . Hypothyroidism   . Kidney stones   . MR (mental retardation)   . Pyelonephritis   . Sepsis [facility])     Patient Active Problem List   Diagnosis  Date Noted  . Calculus of bladder 05/05/2019  . Acute renal failure superimposed on stage 3 chronic kidney disease ([place]) 06/03/2018  . Acute on chronic diastolic CHF (congestive heart failure) ([place]) 06/03/2018  . Acute respiratory failure with hypoxia ([place]) 06/03/2018  . Hypokalemia 06/03/2018  . Morbid obesity with BMI of 50.0-59.9, adult ([place]) 06/03/2018  . Acute exacerbation of CHF (congestive heart failure) ([place]) 06/01/2018  . Hypoxia 06/01/2018  . Pneumonia 05/09/2018  . Acute respiratory failure ([place]) 05/07/2018  . Acute metabolic encephalopathy 70/48/8891  . Hypomagnesemia 04/21/2018  . UTI (urinary tract infection) 04/20/2018  . PICC (peripherally inserted central catheter) in place   . Urinary tract infection without hematuria   . Hydronephrosis with renal and ureteral calculus obstruction 01/21/2016  . Hyperkalemia 01/21/2016  . Palliative care encounter   . DNR (do not resuscitate) discussion   . Encephalopathy 10/17/2015  . Goals of care, counseling/discussion 10/17/2015  . Nephrolithiasis 09/27/2015  . Cerebral palsy ([place]) 09/27/2015  . Sepsis ([place]) 09/26/2015  . AKI (acute kidney injury) ([place]) 09/26/2015  . Fever   . Urinary tract infectious disease   . Dilated cbd, acquired 09/14/2015  . Hypothyroidism 09/14/2015  . Hypertension 09/14/2015  . Depression with anxiety 09/14/2015  . UPJ obstruction, acquired 09/14/2015  . CKD (chronic kidney disease), stage III ([place]) 09/14/2015  . GERD (  gastroesophageal reflux disease) 09/14/2015  . Calculus of gallbladder without cholecystitis without obstruction   . Abdominal pain, right upper quadrant 02/11/2011  . Acute pyelonephritis 02/11/2011  . Agoraphobia 02/11/2011  . Acute renal failure ([place]) 02/11/2011  . Hyponatremia 02/11/2011  . Dehydration 02/11/2011    Past Surgical History:  Procedure Laterality Date  . CYSTOSCOPY W/ URETERAL STENT PLACEMENT Bilateral 09/26/2015   Procedure: CYSTOSCOPY WITH Bilateral   RETROGRADE PYELOGRAM/URETERAL STENT PLACEMENT;  Surgeon: [name], MD;  Location: [facility];  Service: Urology;  Laterality: Bilateral;  . CYSTOSCOPY W/ URETERAL STENT PLACEMENT Right 04/22/2018   Procedure: CYSTOSCOPY WITH RETROGRADE PYELOGRAM/URETERAL STENT PLACEMENT;  Surgeon: Cleon Gustin, MD;  Location: [facility];  Service: Urology;  Laterality: Right;  . CYSTOSCOPY W/ URETERAL STENT REMOVAL Left 11/10/2015   Procedure: CYSTOSCOPY WITH STENT REMOVAL;  Surgeon: Cleon Gustin, MD;  Location: [facility];  Service: Urology;  Laterality: Left;  . CYSTOSCOPY WITH RETROGRADE PYELOGRAM, URETEROSCOPY AND STENT PLACEMENT Right 06/24/2018   Procedure: CYSTOSCOPY WITH RIGHT RETROGRADE PYELOGRAM, RIGHT URETEROSCOPY AND RIGHT URETERAL STENT EXCHANGE;  Surgeon: Cleon Gustin, MD;  Location: [facility];  Service: Urology;  Laterality: Right;  . CYSTOSCOPY WITH STENT PLACEMENT Right 01/21/2016   Procedure: CYSTOSCOPY WITH STENT PLACEMENT;  Surgeon: Franchot Gallo, MD;  Location: [facility];  Service: Urology;  Laterality: Right;  . CYSTOSCOPY/RETROGRADE/URETEROSCOPY/STONE EXTRACTION WITH BASKET Left 10/31/2015   Procedure: CYSTOSCOPY/ BILATERAL URETEROSCOPY/BILATERAL STONE EXTRACTION/ LEFT STENT PLACEMENT;  Surgeon: Irine Seal, MD;  Location: [facility];  Service: Urology;  Laterality: Left;  . CYSTOSCOPY/URETEROSCOPY/HOLMIUM LASER/STENT PLACEMENT Right 02/27/2016   Procedure: CYSTOSCOPY/RIGHT URETEROSCOPY/HOLMIUM LASER/STENT PLACEMENT/STENT REMOVAL;  Surgeon: Irine Seal, MD;  Location: [facility];  Service: Urology;  Laterality: Right;  . HOLMIUM LASER APPLICATION N/A 20/25/4270   Procedure: HOLMIUM LASER APPLICATION;  Surgeon: Irine Seal, MD;  Location: [facility];  Service: Urology;  Laterality: N/A;  . HOLMIUM LASER APPLICATION Right 06/30/7626   Procedure: HOLMIUM LASER APPLICATION;  Surgeon: Cleon Gustin, MD;  Location: [facility];  Service: Urology;  Laterality: Right;  . IR CV LINE INJECTION  05/06/2018  . IR  FLUORO GUIDE PORT INSERTION RIGHT  04/10/2016  . IR US GUIDE VASC ACCESS RIGHT  04/10/2016     OB History   No obstetric history on file.     Family History  Family history unknown: Yes    Social History   Tobacco Use  . Smoking status: Never Smoker  . Smokeless tobacco: Never Used  Substance Use Topics  . Alcohol use: No  . Drug use: No    Home Medications Prior to Admission medications   Medication Sig Start Date End Date Taking? Authorizing Provider  acetaminophen (TYLENOL) 325 MG tablet Take 2 tablets (650 mg total) by mouth every 6 (six) hours as needed for mild pain (or Fever >/= 101). 06/08/18   Roxan Hockey, MD  ALPRAZolam Duanne Moron) 0.5 MG tablet Take 0.5 mg by mouth daily as needed. 04/30/19   [provider]  ALPRAZolam Duanne Moron) 1 MG tablet Take 1 tablet (1 mg total) by mouth 2 (two) times daily as needed for anxiety. Patient taking differently: Take 1 mg by mouth at bedtime. *May take one tablet twice daily as needed for anxiety 06/17/18   Roxan Hockey, MD  aspirin EC 81 MG tablet Take 1 tablet (81 mg total) by mouth daily with breakfast. 06/08/18   Emokpae, Courage, MD  busPIRone (BUSPAR) 5 MG tablet Take 5 mg by mouth 2 (two) times daily. 10/23/18  [provider]  chlorproMAZINE (THORAZINE) 25 MG tablet Take 25 mg by mouth 2 (two) times daily as needed for nausea or vomiting.  07/09/18   [provider]  escitalopram (LEXAPRO) 20 MG tablet Take 1 tablet (20 mg total) by mouth daily. 06/17/18   Roxan Hockey, MD  glucose blood test strip Use as instructed--- Dx R73.9 06/17/18   Roxan Hockey, MD  ipratropium-albuterol (DUONEB) 0.5-2.5 (3) MG/3ML SOLN  03/05/19   [provider]  levothyroxine (SYNTHROID, LEVOTHROID) 50 MCG tablet Take 50 mcg by mouth daily before breakfast.    [provider]  midodrine (PROAMATINE) 10 MG tablet Take 1 tablet (10 mg total) by mouth 3 (three) times daily with meals. 06/17/18   Emokpae, Courage,  MD  MUCINEX 600 MG 12 hr tablet Take 600 mg by mouth 2 (two) times daily. 01/19/19   [provider]  nystatin (MYCOSTATIN) 100000 UNIT/ML suspension  12/16/18   [provider]  nystatin cream (MYCOSTATIN)  12/16/18   [provider]  OLANZapine (ZYPREXA) 20 MG tablet Take 20 mg by mouth at bedtime. 04/05/19   [provider]  OLANZapine (ZYPREXA) 5 MG tablet Take 5 mg by mouth at bedtime. 10/23/18   [provider]  omeprazole (PRILOSEC) 20 MG capsule Take 20 mg by mouth daily before breakfast.     [provider]  oxybutynin (DITROPAN-XL) 10 MG 24 hr tablet Take 10 mg by mouth every morning.     [provider]  potassium chloride SA (K-DUR) 20 MEQ tablet Take 2 tablets (40 mEq total) by mouth daily. 06/17/18   Emokpae, Courage, MD  senna-docusate (SENOKOT-S) 8.6-50 MG tablet Take 1 tablet by mouth 2 (two) times daily. Patient taking differently: Take 1 tablet by mouth 2 (two) times daily as needed.  01/26/16   Raiford Noble Latif, DO  sodium bicarbonate 650 MG tablet Take 1 tablet by mouth daily. 11/23/18   [provider]  torsemide (DEMADEX) 20 MG tablet Take 1 tablet (20 mg total) by mouth in the morning, at noon, and at bedtime for 5 days. For Fluid 04/08/19 04/13/19  Madilyn Hook A, PA-C  zolpidem (AMBIEN) 10 MG tablet Take 10 mg by mouth at bedtime. 10/23/18   [provider]    Allergies    Macrobid [nitrofurantoin monohyd macro], Penicillins, and Cefdinir  Review of Systems   Review of Systems Ten systems reviewed and are negative for acute change, except as noted in the HPI.  Physical Exam Updated Vital Signs There were no vitals taken for this visit.  Physical Exam Exam conducted with a chaperone present.  Constitutional:      Appearance: She is obese.  HENT:     Head: Normocephalic and atraumatic.     Mouth/Throat:     Mouth: Mucous membranes are dry.  Eyes:     Pupils: Pupils are equal, round,  and reactive to light.     Comments: Disconjugate gaze  Cardiovascular:     Rate and Rhythm: Normal rate and regular rhythm.  Pulmonary:     Effort: Pulmonary effort is normal.     Breath sounds: Normal breath sounds.  Abdominal:     General: There is no distension.     Tenderness: There is no abdominal tenderness.     Comments: Soft, Obese abdomen  Genitourinary:    General: Normal vulva.     Exam position: Prone.  Musculoskeletal:        General: Normal range of motion.  Cervical back: Normal range of motion.  Skin:    General: Skin is warm.  Neurological:     General: No focal deficit present.     Mental Status: She is alert. Mental status is at baseline.     ED Results / Procedures / Treatments   Labs (all labs ordered are listed, but only abnormal results are displayed) Labs Reviewed - No data to display  EKG None  Radiology No results found.  Procedures BLADDER CATHETERIZATION  Date/Time: 05/19/2019 5:41 PM Performed by: Margarita Mail, PA-C Authorized by: Margarita Mail, PA-C   Consent:    Consent obtained:  Verbal   Consent given by:  Guardian   Risks discussed:  Incomplete procedure, pain, urethral injury, infection and false passage   Alternatives discussed:  No treatment Pre-procedure details:    Procedure purpose:  Diagnostic   Preparation: Patient was prepped and draped in usual sterile fashion   Anesthesia (see MAR for exact dosages):    Anesthesia method:  None Procedure details:    Provider performed due to:  Complicated insertion   Catheter insertion:  Temporary indwelling   Catheter type:  Foley   Catheter size:  14 Fr   Bladder irrigation: no     Number of attempts:  1   Urine characteristics:  Clear Post-procedure details:    Patient tolerance of procedure:  Tolerated well, no immediate complications   (including critical care time)  Medications Ordered in ED Medications - No data to display  ED Course  I have reviewed the  triage vital signs and the nursing notes.  Pertinent labs & imaging results that were available during my care of the patient were reviewed by me and considered in my medical decision making (see chart for details).  Clinical Course as of May 18 1737  Wed May 19, 2019  1738 Patient with > 1 L fluid on bladder scan    [AH]    Clinical Course User Index [AH] Margarita Mail, PA-C   MDM Rules/Calculators/A&P                      CC:UTI VS: BP 116/62   Pulse (!) 54   Temp (!) 96.8 F (36 C) (Temporal)   Resp 18   Ht 4\' 7"  (1.397 m)   Wt (!) 150 kg   SpO2 100%   BMI 76.86 kg/m  MP:NTIRWER is gathered by sister and EMR, phone call with Clinic PCP. Previous records obtained and reviewed. Labs: I ordered reviewed and interpreted labs which include CBC and Cath urine without any abnormality. BMP shows chronic renal insufficiency without acute change. Potassium initially elevated but negative on chem 8- suspect hemolysis. Imaging: I ordered and reviewed images which included CT RENAL and bedside bladder scan. I independently visualized and interpreted all imaging. Significant findings include R hydronephrosis and distended bladder without overt signs of obstruction, bladder scan with > 1080ml.  EKG: Consults: XVQ:[id number] here after being sent in by pcp for UTI. No evidence of infection. She has acute urinary retention here which her sister states she has had in the past. No evidence of leg weakness. No bowel or bladder incontinence. Patient obviously able to feel Cath procedure by her clinical appearance during procedure. Doubt cauda equina. She has a urologist and we will have her follow closely. D/c with leg bag and foley Patient disposition: The patient appears reasonably screened and/or stabilized for discharge and I doubt any other medical condition or other [facility] requiring further  screening, evaluation, or treatment in the ED at this time prior to discharge. I have discussed lab and/or  imaging findings with the patient and answered all questions/concerns to the best of my ability.I have discussed return precautions and OP follow up.    Final Clinical Impression(s) / ED Diagnoses Final diagnoses:  None    Rx / DC Orders ED Discharge Orders    None       Margarita Mail, PA-C 05/19/19 2158    Milton Ferguson, MD 05/19/19 2318

## 2019-05-19 NOTE — ED Triage Notes (Signed)
Per EMS- Pt diagnosed with bladder infection via [facility].Pt more altered than normal per family.  Sent to [facility] ER for IV antibiotics.

## 2019-05-20 ENCOUNTER — Telehealth: Payer: Self-pay | Admitting: Urology

## 2019-05-20 NOTE — Telephone Encounter (Signed)
Patient was seen in ER yesterday. She called to see when she needed to f/u with Dr Alyson Ingles

## 2019-05-20 NOTE — Telephone Encounter (Signed)
Spoke with [name], pt sister. Appt offered for next Wednesday, may 19th. Sister unavailable. Scheduled for 5/24.

## 2019-05-22 LAB — URINE CULTURE: Culture: >=100000 — AB

## 2019-05-23 ENCOUNTER — Emergency Department (HOSPITAL_COMMUNITY): Payer: Medicare Other

## 2019-05-23 ENCOUNTER — Telehealth: Payer: Self-pay | Admitting: Emergency Medicine

## 2019-05-23 ENCOUNTER — Encounter (HOSPITAL_COMMUNITY): Payer: Self-pay | Admitting: *Deleted

## 2019-05-23 ENCOUNTER — Inpatient Hospital Stay (HOSPITAL_COMMUNITY)
Admission: EM | Admit: 2019-05-23 | Discharge: 2019-06-08 | DRG: 208 | Disposition: E | Payer: Medicare Other | Attending: Family Medicine | Admitting: Family Medicine

## 2019-05-23 DIAGNOSIS — R0989 Other specified symptoms and signs involving the circulatory and respiratory systems: Secondary | ICD-10-CM

## 2019-05-23 DIAGNOSIS — F418 Other specified anxiety disorders: Secondary | ICD-10-CM | POA: Diagnosis present

## 2019-05-23 DIAGNOSIS — G808 Other cerebral palsy: Secondary | ICD-10-CM | POA: Diagnosis not present

## 2019-05-23 DIAGNOSIS — E1122 Type 2 diabetes mellitus with diabetic chronic kidney disease: Secondary | ICD-10-CM | POA: Diagnosis present

## 2019-05-23 DIAGNOSIS — J9601 Acute respiratory failure with hypoxia: Secondary | ICD-10-CM | POA: Diagnosis present

## 2019-05-23 DIAGNOSIS — Z8673 Personal history of transient ischemic attack (TIA), and cerebral infarction without residual deficits: Secondary | ICD-10-CM

## 2019-05-23 DIAGNOSIS — E039 Hypothyroidism, unspecified: Secondary | ICD-10-CM | POA: Diagnosis present

## 2019-05-23 DIAGNOSIS — K219 Gastro-esophageal reflux disease without esophagitis: Secondary | ICD-10-CM | POA: Diagnosis present

## 2019-05-23 DIAGNOSIS — T17908A Unspecified foreign body in respiratory tract, part unspecified causing other injury, initial encounter: Secondary | ICD-10-CM | POA: Diagnosis present

## 2019-05-23 DIAGNOSIS — R9431 Abnormal electrocardiogram [ECG] [EKG]: Secondary | ICD-10-CM | POA: Diagnosis present

## 2019-05-23 DIAGNOSIS — Z66 Do not resuscitate: Secondary | ICD-10-CM | POA: Diagnosis present

## 2019-05-23 DIAGNOSIS — T68XXXD Hypothermia, subsequent encounter: Secondary | ICD-10-CM | POA: Diagnosis not present

## 2019-05-23 DIAGNOSIS — I130 Hypertensive heart and chronic kidney disease with heart failure and stage 1 through stage 4 chronic kidney disease, or unspecified chronic kidney disease: Secondary | ICD-10-CM | POA: Diagnosis present

## 2019-05-23 DIAGNOSIS — Z79890 Hormone replacement therapy (postmenopausal): Secondary | ICD-10-CM

## 2019-05-23 DIAGNOSIS — E872 Acidosis, unspecified: Secondary | ICD-10-CM | POA: Diagnosis present

## 2019-05-23 DIAGNOSIS — I5032 Chronic diastolic (congestive) heart failure: Secondary | ICD-10-CM | POA: Diagnosis present

## 2019-05-23 DIAGNOSIS — Z880 Allergy status to penicillin: Secondary | ICD-10-CM

## 2019-05-23 DIAGNOSIS — I509 Heart failure, unspecified: Secondary | ICD-10-CM

## 2019-05-23 DIAGNOSIS — I959 Hypotension, unspecified: Secondary | ICD-10-CM | POA: Diagnosis present

## 2019-05-23 DIAGNOSIS — E1165 Type 2 diabetes mellitus with hyperglycemia: Secondary | ICD-10-CM | POA: Diagnosis present

## 2019-05-23 DIAGNOSIS — Z515 Encounter for palliative care: Secondary | ICD-10-CM | POA: Diagnosis not present

## 2019-05-23 DIAGNOSIS — Z20822 Contact with and (suspected) exposure to covid-19: Secondary | ICD-10-CM | POA: Diagnosis present

## 2019-05-23 DIAGNOSIS — X58XXXA Exposure to other specified factors, initial encounter: Secondary | ICD-10-CM | POA: Diagnosis present

## 2019-05-23 DIAGNOSIS — Z881 Allergy status to other antibiotic agents status: Secondary | ICD-10-CM

## 2019-05-23 DIAGNOSIS — I469 Cardiac arrest, cause unspecified: Secondary | ICD-10-CM | POA: Diagnosis present

## 2019-05-23 DIAGNOSIS — R680 Hypothermia, not associated with low environmental temperature: Secondary | ICD-10-CM | POA: Diagnosis present

## 2019-05-23 DIAGNOSIS — E785 Hyperlipidemia, unspecified: Secondary | ICD-10-CM | POA: Diagnosis present

## 2019-05-23 DIAGNOSIS — I4901 Ventricular fibrillation: Secondary | ICD-10-CM | POA: Diagnosis present

## 2019-05-23 DIAGNOSIS — Z87442 Personal history of urinary calculi: Secondary | ICD-10-CM

## 2019-05-23 DIAGNOSIS — Z6841 Body Mass Index (BMI) 40.0 and over, adult: Secondary | ICD-10-CM

## 2019-05-23 DIAGNOSIS — R7401 Elevation of levels of liver transaminase levels: Secondary | ICD-10-CM | POA: Diagnosis present

## 2019-05-23 DIAGNOSIS — G931 Anoxic brain damage, not elsewhere classified: Secondary | ICD-10-CM | POA: Diagnosis present

## 2019-05-23 DIAGNOSIS — Z7189 Other specified counseling: Secondary | ICD-10-CM | POA: Diagnosis not present

## 2019-05-23 DIAGNOSIS — G809 Cerebral palsy, unspecified: Secondary | ICD-10-CM | POA: Diagnosis present

## 2019-05-23 DIAGNOSIS — D696 Thrombocytopenia, unspecified: Secondary | ICD-10-CM | POA: Diagnosis present

## 2019-05-23 DIAGNOSIS — Z960 Presence of urogenital implants: Secondary | ICD-10-CM | POA: Diagnosis present

## 2019-05-23 DIAGNOSIS — T17820A Food in other parts of respiratory tract causing asphyxiation, initial encounter: Principal | ICD-10-CM | POA: Diagnosis present

## 2019-05-23 DIAGNOSIS — Z79899 Other long term (current) drug therapy: Secondary | ICD-10-CM

## 2019-05-23 DIAGNOSIS — N184 Chronic kidney disease, stage 4 (severe): Secondary | ICD-10-CM | POA: Diagnosis present

## 2019-05-23 DIAGNOSIS — I468 Cardiac arrest due to other underlying condition: Secondary | ICD-10-CM | POA: Diagnosis present

## 2019-05-23 DIAGNOSIS — F419 Anxiety disorder, unspecified: Secondary | ICD-10-CM | POA: Diagnosis present

## 2019-05-23 DIAGNOSIS — T68XXXA Hypothermia, initial encounter: Secondary | ICD-10-CM | POA: Diagnosis not present

## 2019-05-23 DIAGNOSIS — F79 Unspecified intellectual disabilities: Secondary | ICD-10-CM | POA: Diagnosis present

## 2019-05-23 LAB — I-STAT CHEM 8, ED
BUN: 54 mg/dL — ABNORMAL HIGH (ref 8–23)
Calcium, Ion: 0.94 mmol/L — ABNORMAL LOW (ref 1.15–1.40)
Chloride: 112 mmol/L — ABNORMAL HIGH (ref 98–111)
Creatinine, Ser: 2.2 mg/dL — ABNORMAL HIGH (ref 0.44–1.00)
Glucose, Bld: 224 mg/dL — ABNORMAL HIGH (ref 70–99)
HCT: 37 % (ref 36.0–46.0)
Hemoglobin: 12.6 g/dL (ref 12.0–15.0)
Potassium: 5.1 mmol/L (ref 3.5–5.1)
Sodium: 143 mmol/L (ref 135–145)
TCO2: 20 mmol/L — ABNORMAL LOW (ref 22–32)

## 2019-05-23 LAB — SARS CORONAVIRUS 2 BY RT PCR (HOSPITAL ORDER, PERFORMED IN ~~LOC~~ HOSPITAL LAB): SARS Coronavirus 2: NEGATIVE

## 2019-05-23 LAB — CBC WITH DIFFERENTIAL/PLATELET
Abs Immature Granulocytes: 0.68 10*3/uL — ABNORMAL HIGH (ref 0.00–0.07)
Basophils Absolute: 0 10*3/uL (ref 0.0–0.1)
Basophils Relative: 0 %
Eosinophils Absolute: 0 10*3/uL (ref 0.0–0.5)
Eosinophils Relative: 0 %
HCT: 38.7 % (ref 36.0–46.0)
Hemoglobin: 12.1 g/dL (ref 12.0–15.0)
Immature Granulocytes: 7 %
Lymphocytes Relative: 14 %
Lymphs Abs: 1.4 10*3/uL (ref 0.7–4.0)
MCH: 30.8 pg (ref 26.0–34.0)
MCHC: 31.3 g/dL (ref 30.0–36.0)
MCV: 98.5 fL (ref 80.0–100.0)
Monocytes Absolute: 0.3 10*3/uL (ref 0.1–1.0)
Monocytes Relative: 3 %
Neutro Abs: 7.6 10*3/uL (ref 1.7–7.7)
Neutrophils Relative %: 76 %
Platelets: 116 10*3/uL — ABNORMAL LOW (ref 150–400)
RBC: 3.93 MIL/uL (ref 3.87–5.11)
RDW: 13.6 % (ref 11.5–15.5)
WBC: 10 10*3/uL (ref 4.0–10.5)
nRBC: 0 % (ref 0.0–0.2)

## 2019-05-23 LAB — COMPREHENSIVE METABOLIC PANEL
ALT: 124 U/L — ABNORMAL HIGH (ref 0–44)
AST: 172 U/L — ABNORMAL HIGH (ref 15–41)
Albumin: 3.3 g/dL — ABNORMAL LOW (ref 3.5–5.0)
Alkaline Phosphatase: 193 U/L — ABNORMAL HIGH (ref 38–126)
Anion gap: 14 (ref 5–15)
BUN: 45 mg/dL — ABNORMAL HIGH (ref 8–23)
CO2: 22 mmol/L (ref 22–32)
Calcium: 8.4 mg/dL — ABNORMAL LOW (ref 8.9–10.3)
Chloride: 106 mmol/L (ref 98–111)
Creatinine, Ser: 2.24 mg/dL — ABNORMAL HIGH (ref 0.44–1.00)
GFR calc Af Amer: 25 mL/min — ABNORMAL LOW (ref >60)
GFR calc non Af Amer: 22 mL/min — ABNORMAL LOW (ref >60)
Glucose, Bld: 238 mg/dL — ABNORMAL HIGH (ref 70–99)
Potassium: 4.8 mmol/L (ref 3.5–5.1)
Sodium: 142 mmol/L (ref 135–145)
Total Bilirubin: 0.7 mg/dL (ref 0.3–1.2)
Total Protein: 6.4 g/dL — ABNORMAL LOW (ref 6.5–8.1)

## 2019-05-23 LAB — BLOOD GAS, ARTERIAL
Acid-base deficit: 4.2 mmol/L — ABNORMAL HIGH (ref 0.0–2.0)
Bicarbonate: 20 mmol/L (ref 20.0–28.0)
FIO2: 100
O2 Saturation: 98.2 %
Patient temperature: 34
pCO2 arterial: 50.7 mmHg — ABNORMAL HIGH (ref 32.0–48.0)
pH, Arterial: 7.25 — ABNORMAL LOW (ref 7.350–7.450)
pO2, Arterial: 126 mmHg — ABNORMAL HIGH (ref 83.0–108.0)

## 2019-05-23 LAB — TROPONIN I (HIGH SENSITIVITY): Troponin I (High Sensitivity): 16 ng/L (ref <18)

## 2019-05-23 LAB — LACTIC ACID, PLASMA: Lactic Acid, Venous: 9 mmol/L (ref 0.5–1.9)

## 2019-05-23 MED ORDER — PHENYLEPHRINE HCL (PRESSORS) 10 MG/ML IV SOLN
INTRAVENOUS | Status: AC
  Filled 2019-05-23: qty 1

## 2019-05-23 MED ORDER — ETOMIDATE 2 MG/ML IV SOLN
INTRAVENOUS | Status: AC | PRN
  Administered 2019-05-23: 20 mg via INTRAVENOUS

## 2019-05-23 MED ORDER — LACTATED RINGERS IV BOLUS
1000.0000 mL | Freq: Once | INTRAVENOUS | Status: AC
  Administered 2019-05-23: 1000 mL via INTRAVENOUS

## 2019-05-23 MED ORDER — NOREPINEPHRINE 4 MG/250ML-% IV SOLN
0.0000 ug/min | INTRAVENOUS | Status: DC
  Administered 2019-05-23: 2 ug/min via INTRAVENOUS
  Administered 2019-05-24: 10 ug/min via INTRAVENOUS
  Filled 2019-05-23 (×2): qty 250

## 2019-05-23 MED ORDER — STERILE WATER FOR INJECTION IV SOLN
INTRAVENOUS | Status: DC
  Filled 2019-05-23 (×6): qty 850

## 2019-05-23 MED ORDER — EPINEPHRINE 1 MG/10ML IJ SOSY
PREFILLED_SYRINGE | INTRAMUSCULAR | Status: AC | PRN
  Administered 2019-05-23: .25 mg via INTRAVENOUS

## 2019-05-23 MED ORDER — ENOXAPARIN SODIUM 40 MG/0.4ML ~~LOC~~ SOLN
40.0000 mg | Freq: Every day | SUBCUTANEOUS | Status: DC
  Administered 2019-05-24: 40 mg via SUBCUTANEOUS
  Filled 2019-05-23: qty 0.4

## 2019-05-23 MED ORDER — SODIUM CHLORIDE 0.9 % IV SOLN: INTRAVENOUS | Status: DC

## 2019-05-23 MED ORDER — SODIUM BICARBONATE 8.4 % IV SOLN
INTRAVENOUS | Status: AC
  Filled 2019-05-23: qty 150

## 2019-05-23 MED ORDER — PHENYLEPHRINE HCL-NACL 10-0.9 MG/250ML-% IV SOLN
INTRAVENOUS | Status: AC
  Filled 2019-05-23: qty 250

## 2019-05-23 MED ORDER — ROCURONIUM BROMIDE 50 MG/5ML IV SOLN
INTRAVENOUS | Status: AC | PRN
  Administered 2019-05-23: 100 mg via INTRAVENOUS

## 2019-05-23 MED ORDER — SODIUM BICARBONATE 8.4 % IV SOLN
100.0000 meq | Freq: Once | INTRAVENOUS | Status: AC
  Administered 2019-05-23: 100 meq via INTRAVENOUS

## 2019-05-23 NOTE — ED Notes (Signed)
Pt came from home with foley catheter in place.

## 2019-05-23 NOTE — ED Triage Notes (Addendum)
Pt arrived to er by [facility] after pt getting choked on a cookie, pt was defibrillated by fire department with aed prior to ems arrived to pt, upon ems arrival pt was PEA, cpr continued for 50 minutes with return of pulses after being given 6 mg of epi by ems,  pt had king airway that was in place and pulled out en route to er,

## 2019-05-23 NOTE — ED Notes (Addendum)
Date and time results received: 05/23/2019 22:42   Test:Lactic Critical Value: 9.0  Name of Provider Notified: Alvino Chapel  Orders Received? Or Actions Taken?:Provider notified.

## 2019-05-23 NOTE — Telephone Encounter (Signed)
Post ED Visit - Positive Culture Follow-up  Culture report reviewed by antimicrobial stewardship pharmacist: [place] Team []  Elenor Quinones, Pharm.D. []  Heide Guile, Pharm.D., BCPS AQ-ID []  Parks Neptune, Pharm.D., BCPS []  Alycia Rossetti, Pharm.D., BCPS []  [place], Pharm.D., BCPS, AAHIVP []  Legrand Como, Pharm.D., BCPS, AAHIVP []  Salome Arnt, PharmD, BCPS []  Johnnette Gourd, PharmD, BCPS []  Hughes Better, PharmD, BCPS []  Leeroy Cha, PharmD []  Laqueta Linden, PharmD, BCPS [x]  Albertina Parr, PharmD  [place] Team []  Leodis Sias, PharmD []  Lindell Spar, PharmD []  Royetta Asal, PharmD []  Graylin Shiver, Rph []  Rema Fendt) Glennon Mac, PharmD []  Arlyn Dunning, PharmD []  Netta Cedars, PharmD []  Dia Sitter, PharmD []  Leone Haven, PharmD []  Gretta Arab, PharmD []  Theodis Shove, PharmD []  Peggyann Juba, PharmD []  Reuel Boom, PharmD   Positive urine culture Treated with Levofloxacin, organism sensitive to the same and no further patient follow-up is required at this time.  Sandi Raveling Boen Sterbenz 05/18/2019, 5:05 PM

## 2019-05-23 NOTE — ED Notes (Signed)
Family at bedside. 

## 2019-05-23 NOTE — ED Notes (Signed)
Ems attempted bilateral tib/fib IO's without success,

## 2019-05-23 NOTE — H&P (Signed)
History and Physical    Rachel Morgan MHD:[id number] DOB: 09-05-1950 DOA: 05/31/2019  PCP: [facility]   Patient coming from: Home.  I have personally briefly reviewed patient's old medical records in [place]  Chief Complaint: Cardiac arrest.  HPI: Rachel Morgan is a 69 y.o. female with medical history significant of anxiety, cholelithiasis, cerebral palsy, chronic kidney disease, type 2 diabetes, GERD, hyperlipidemia, or lithiasis, hypertension, hypothyroidism, MR, history of pyelonephritis who was brought to the emergency department by [facility] who were called to the scene due to the patient getting choked on a cookie.  Patient was defibrillated with an AED by the fire department before the EMS crew arrived and she was in pulseless electrical activity.  CPR was done for 15 minutes with return of pulses after receiving 60 mg of epinephrine by EMS.  They placed an advanced airway tube, but it got kinked and was lost in route to the emergency department.  She received at least 3 more rounds of epinephrine and the emergency department.  She is currently comatose and unable to provide further history.  ED Course: After CPR her temperature is 92.9 F, pulse 68, respirations 21 and BP 76 over 14 mmHg.  She received a 1000 mL bolus of LR besides ACLS protocol medications.  I added 2 A of sodium bicarb, another 1000 mL of LR bolus and started her on a sodium bicarbonate infusion.  Her CBC shows a white count of 10.0, hemoglobin 12.1 g/dL and platelets 116.  CMP showed normal electrolytes when calcium was corrected to albumin.  Glucose 138, BUN 45, creatinine 2.24 mg/dL.  Total protein 6.4 and albumin 3.3 g/dL.  AST 172, ALT 124 alkaline phosphatase 194 units/L.  Bilirubin was normal. Lactic acid was 9.0 then 3.3 mmol/L.  Troponin was 45 ng/L.  Her ABG had a pH of 7.25, PCO2 of 50.7, PO2 of 126 mmHg.  Bicarbonate was 20.0, acid base deficit is 4.2 mmol/L.   O2 sat is 98.2%.  Imaging: Chest radiograph shows an ET tube just at the carina and multifocal airspace opacities throughout both lungs which could be due to asymmetric edema and/or infectious etiology.  CT head did not show any acute intracranial abnormality.  Please see images and full radiology report for further detail.  Review of Systems: Unable to obtain..  Past Medical History:  Diagnosis Date  . Anxiety   . Calculus of gallbladder   . Cerebral palsy ([place])   . Chronic kidney disease   . Diabetes mellitus   . Gall stones   . GERD (gastroesophageal reflux disease)   . High cholesterol   . History of kidney stones   . Hypertension   . Hypothyroidism   . Kidney stones   . MR (mental retardation)   . Pyelonephritis   . Sepsis [facility])     Past Surgical History:  Procedure Laterality Date  . CYSTOSCOPY W/ URETERAL STENT PLACEMENT Bilateral 09/26/2015   Procedure: CYSTOSCOPY WITH Bilateral  RETROGRADE PYELOGRAM/URETERAL STENT PLACEMENT;  Surgeon: [name], MD;  Location: [facility];  Service: Urology;  Laterality: Bilateral;  . CYSTOSCOPY W/ URETERAL STENT PLACEMENT Right 04/22/2018   Procedure: CYSTOSCOPY WITH RETROGRADE PYELOGRAM/URETERAL STENT PLACEMENT;  Surgeon: Cleon Gustin, MD;  Location: [facility];  Service: Urology;  Laterality: Right;  . CYSTOSCOPY W/ URETERAL STENT REMOVAL Left 11/10/2015   Procedure: CYSTOSCOPY WITH STENT REMOVAL;  Surgeon: Cleon Gustin, MD;  Location: [facility];  Service: Urology;  Laterality: Left;  . CYSTOSCOPY WITH RETROGRADE PYELOGRAM, URETEROSCOPY AND STENT PLACEMENT Right 06/24/2018   Procedure: CYSTOSCOPY WITH RIGHT RETROGRADE PYELOGRAM, RIGHT URETEROSCOPY AND RIGHT URETERAL STENT EXCHANGE;  Surgeon: Cleon Gustin, MD;  Location: [facility];  Service: Urology;  Laterality: Right;  . CYSTOSCOPY WITH STENT PLACEMENT Right 01/21/2016   Procedure: CYSTOSCOPY WITH STENT PLACEMENT;  Surgeon: Franchot Gallo, MD;  Location: [facility];  Service:  Urology;  Laterality: Right;  . CYSTOSCOPY/RETROGRADE/URETEROSCOPY/STONE EXTRACTION WITH BASKET Left 10/31/2015   Procedure: CYSTOSCOPY/ BILATERAL URETEROSCOPY/BILATERAL STONE EXTRACTION/ LEFT STENT PLACEMENT;  Surgeon: Irine Seal, MD;  Location: [facility];  Service: Urology;  Laterality: Left;  . CYSTOSCOPY/URETEROSCOPY/HOLMIUM LASER/STENT PLACEMENT Right 02/27/2016   Procedure: CYSTOSCOPY/RIGHT URETEROSCOPY/HOLMIUM LASER/STENT PLACEMENT/STENT REMOVAL;  Surgeon: Irine Seal, MD;  Location: [facility];  Service: Urology;  Laterality: Right;  . HOLMIUM LASER APPLICATION N/A 89/16/9450   Procedure: HOLMIUM LASER APPLICATION;  Surgeon: Irine Seal, MD;  Location: [facility];  Service: Urology;  Laterality: N/A;  . HOLMIUM LASER APPLICATION Right 3/88/8280   Procedure: HOLMIUM LASER APPLICATION;  Surgeon: Cleon Gustin, MD;  Location: [facility];  Service: Urology;  Laterality: Right;  . IR CV LINE INJECTION  05/06/2018  . IR FLUORO GUIDE PORT INSERTION RIGHT  04/10/2016  . IR US GUIDE VASC ACCESS RIGHT  04/10/2016    Social History  reports that she has never smoked. She has never used smokeless tobacco. She reports that she does not drink alcohol or use drugs.  Allergies  Allergen Reactions  . Macrobid [Nitrofurantoin Monohyd Macro] Hives and Swelling  . Penicillins Swelling and Rash  . Cefdinir Hives    Family History  Family history unknown: Yes   Prior to Admission medications   Medication Sig Start Date End Date Taking? Authorizing Provider  acetaminophen (TYLENOL) 325 MG tablet Take 2 tablets (650 mg total) by mouth every 6 (six) hours as needed for mild pain (or Fever >/= 101). 06/08/18  Yes Emokpae, Courage, MD  ALPRAZolam Duanne Moron) 1 MG tablet Take 1 tablet (1 mg total) by mouth 2 (two) times daily as needed for anxiety. Patient taking differently: Take 1 mg by mouth at bedtime. *May take one tablet twice daily as needed for anxiety 06/17/18  Yes Emokpae, Courage, MD  busPIRone (BUSPAR) 5 MG tablet  Take 5 mg by mouth 2 (two) times daily. 10/23/18  Yes [provider]  chlorproMAZINE (THORAZINE) 25 MG tablet Take 25 mg by mouth at bedtime.  07/09/18  Yes [provider]  escitalopram (LEXAPRO) 20 MG tablet Take 1 tablet (20 mg total) by mouth daily. 06/17/18  Yes Emokpae, Courage, MD  levofloxacin (LEVAQUIN) 250 MG tablet Take 250 mg by mouth daily. 05/19/19  Yes [provider]  levothyroxine (SYNTHROID) 75 MCG tablet Take 75 mcg by mouth daily before breakfast.  05/18/19  Yes [provider]  MUCINEX 600 MG 12 hr tablet Take 600 mg by mouth 2 (two) times daily as needed for cough or to loosen phlegm.  01/19/19  Yes [provider]  OLANZapine (ZYPREXA) 20 MG tablet Take 20 mg by mouth at bedtime. 04/05/19  Yes [provider]  omeprazole (PRILOSEC) 20 MG capsule Take 20 mg by mouth daily before breakfast.    Yes [provider]  oxybutynin (DITROPAN-XL) 10 MG 24 hr tablet Take 10 mg by mouth every morning.    Yes [provider]  potassium chloride SA (K-DUR) 20 MEQ tablet Take 2 tablets (40 mEq total) by mouth daily. 06/17/18  Yes Emokpae, Courage, MD  torsemide (DEMADEX) 20 MG tablet Take 1 tablet (20 mg total) by mouth in the morning, at noon, and at bedtime for 5 days. For Fluid Patient taking differently: Take 20 mg by mouth in the morning and at bedtime.  04/08/19 05/19/2019 Yes Madilyn Hook A, PA-C  zolpidem (AMBIEN) 10 MG tablet Take 10 mg by mouth at bedtime. 10/23/18  Yes [provider]  ipratropium-albuterol (DUONEB) 0.5-2.5 (3) MG/3ML SOLN Take 3 mLs by nebulization every 6 (six) hours as needed (for shortness of breath/wheezing).  03/05/19   [provider]  senna-docusate (SENOKOT-S) 8.6-50 MG tablet Take 1 tablet by mouth 2 (two) times daily. Patient taking differently: Take 1 tablet by mouth 2 (two) times daily as needed.  01/26/16   Kerney Elbe, DO    Physical Exam: Vitals:   05/13/2019 2215  06/03/2019 2230 05/18/2019 2245 05/22/2019 2300  BP: (!) 63/39 (!) 65/36  (!) 71/45  Pulse:  (!) 52 (!) 51 (!) 50  Resp: 17 16 17 17   Temp:      TempSrc:      SpO2:  97% 99% 99%  Weight:      Height:        Constitutional: NAD, calm, comfortable Eyes: PERRL, lids and conjunctivae normal ENMT: Mucous membranes are moist. Posterior pharynx clear of any exudate or lesions.  Neck: normal, supple, no masses, no thyromegaly Respiratory: clear to auscultation bilaterally, no wheezing, no crackles. Normal respiratory effort. No accessory muscle use.  Cardiovascular: Regular rate and rhythm, no murmurs / rubs / gallops. No extremity edema. 2+ pedal pulses. No carotid bruits.  Abdomen: no tenderness, no masses palpated. No hepatosplenomegaly. Bowel sounds positive.  Musculoskeletal: no clubbing / cyanosis. No joint deformity upper and lower extremities. Good ROM, no contractures. Normal muscle tone.  Skin: no rashes, lesions, ulcers. No induration Neurologic: CN 2-12 grossly intact. Sensation intact, DTR normal. Strength 5/5 in all 4.  Psychiatric: Normal judgment and insight. Alert and oriented x 3. Normal mood.   Labs on Admission: I have personally reviewed following labs and imaging studies  CBC: Recent Labs  Lab 05/19/19 1505 05/19/19 1928 06/06/2019 2121 05/17/2019 2130  WBC 4.9  --   --  10.0  NEUTROABS 3.6  --   --  7.6  HGB 12.3 11.9* 12.6 12.1  HCT 39.7 35.0* 37.0 38.7  MCV 97.1  --   --  98.5  PLT 161  --   --  116*    Basic Metabolic Panel: Recent Labs  Lab 05/19/19 1505 05/19/19 1906 05/19/19 1928 06/05/2019 2121 05/21/2019 2130  NA 141  --  142 143 142  K 5.4*  --  5.0 5.1 4.8  CL 104  --  105 112* 106  CO2 28  --   --   --  22  GLUCOSE 116*  --  99 224* 238*  BUN 43*  --  39* 54* 45*  CREATININE 2.06* 2.10* 2.20* 2.20* 2.24*  CALCIUM 9.2  --   --   --  8.4*    GFR: Estimated Creatinine Clearance: 30.9 mL/min (A) (by C-G formula based on SCr of 2.24 mg/dL  (H)).  Liver Function Tests: Recent Labs  Lab 06/05/2019 2130  AST 172*  ALT 124*  ALKPHOS 193*  BILITOT 0.7  PROT 6.4*  ALBUMIN 3.3*   Radiological Exams on Admission: DG Chest Portable 1 View  Result Date: 05/10/2019 CLINICAL DATA:  Intubation EXAM: PORTABLE CHEST 1 VIEW COMPARISON:  April 08, 2018 FINDINGS: ET tube is seen just at the level of the carina. A left-sided central venous catheter seen at the superior cavoatrial junction. There is cardiomegaly. Patchy airspace opacity seen at the right upper lung. There is mildly increased interstitial markings seen at the left upper lung. No pleural effusion. No acute osseous abnormality. NG tube is seen within the proximal stomach. IMPRESSION: ET tube just at the of the carina Multifocal airspace opacities throughout both lungs which could be due to asymmetric edema and/or infectious etiology. Electronically Signed   By: Prudencio Pair M.D.   On: 05/20/2019 21:58    EKG: Independently reviewed. Sinus rhythm Borderline intraventricular conduction delay Low voltage, precordial leads Nonspecific repol abnormality, diffuse leads Prolonged QT interval  Assessment/Plan Principal Problem:   Cardiac arrest with successful resuscitation ([place]) Comatose with no response to stimuli. Neurochecks every 2 hours. Continue sodium bicarb infusion. Continue titrated Levophed infusion. IVF boluses as needed. Monitor intake and output. Prognosis is very poor given length of CPR. Discussed with her niece in the ED.  Active Problems:   Lactic acidosis Resolve. Continue sodium bicarb infusion. Follow-up lactic acid.    Aspiration into airway Begin ceftriaxone 2 g IVPB every 24 hours. Start metronidazole 500 mg IVP every 8 hours.    CKD (chronic kidney disease), stage IV (HCC) Monitor renal function electrolytes. Monitor intake and output.    Transaminitis Follow hepatic function testing a.m.    Prolonged QT interval Magnesium sulfate 2 g  IVPB. Avoid QT prolonging meds if possible Follow-up EKG.    Hypothyroidism Continue levothyroxine via GT or IV.    Cerebral palsy ([place]) Supportive care.    Thrombocytopenia Monitor platelet count. Switch Lovenox to SCDs if  falls below 100 k.    Hyperglycemia No history of DM. Monitor CBG every 6 hours. Check hemoglobin A1c. Begin insulin therapy as needed.    DVT prophylaxis: Lovenox SQ. Code Status:   DNR. Family Communication:   Disposition Plan:   Patient is from:  Home.  Anticipated DC to:  Home.  Anticipated DC date:  Unable to determine this time  Anticipated DC barriers: Clinical improvement. Consults called:   Admission status:  Inpatient/ICU.   Severity of Illness: Very high.  Reubin Milan MD [facility]  How to contact the [facility] Attending or Consulting provider [place] or covering provider during after hours [place], for this patient?   1. Check the care team in [facility] and look for a) attending/consulting [facility] provider listed and b) the [facility] team listed 2. Log into [email or web address] and use [place] universal password to access. If you do not have the password, please contact the hospital operator. 3. Locate the [facility] provider you are looking for under [facility] and page to a number that you can be directly reached. 4. If you still have difficulty reaching the provider, please page the [facility] (Director on Call) for the Hospitalists listed on amion for assistance.  05/15/2019, 11:26 PM   This document was prepared using Dragon voice recognition software and may contain some unintended transcription errors.

## 2019-05-23 NOTE — ED Provider Notes (Signed)
[facility] EMERGENCY DEPARTMENT Provider Note   CSN: [id number] Arrival date & time: 05/17/2019  2109     History Chief Complaint  Patient presents with   Cardiac Arrest    Rachel Morgan is a 69 y.o. female.  HPI Level 5 caveat due to unresponsiveness. Patient has a history of cerebral palsy.  Reportedly choked on a chocolate chip cookie.  Went into cardiac arrest at home.  Fire department arrived and AICD shocked her.  Continued in PEA however.  Had been given around 9 mg of epi.  Eventually had return of vitals with EMS.  Downtime had been around 50 minutes.  Had had a King airway in place that reportedly had been lost before arrival in the ER and was being bagged upon arrival.  Does have reported history of CHF.    Past Medical History:  Diagnosis Date   Anxiety    Calculus of gallbladder    Cerebral palsy ([place])    Chronic kidney disease    Diabetes mellitus    Gall stones    GERD (gastroesophageal reflux disease)    High cholesterol    History of kidney stones    Hypertension    Hypothyroidism    Kidney stones    MR (mental retardation)    Pyelonephritis    Sepsis ([place])     Patient Active Problem List   Diagnosis Date Noted   Calculus of bladder 05/05/2019   Acute renal failure superimposed on stage 3 chronic kidney disease ([place]) 06/03/2018   Acute on chronic diastolic CHF (congestive heart failure) ([place]) 06/03/2018   Acute respiratory failure with hypoxia ([place]) 06/03/2018   Hypokalemia 06/03/2018   Morbid obesity with BMI of 50.0-59.9, adult ([place]) 06/03/2018   Acute exacerbation of CHF (congestive heart failure) ([place]) 06/01/2018   Hypoxia 06/01/2018   Pneumonia 05/09/2018   Acute respiratory failure ([place]) 03/50/0938   Acute metabolic encephalopathy 18/29/9371   Hypomagnesemia 04/21/2018   UTI (urinary tract infection) 04/20/2018   PICC (peripherally inserted central catheter) in place    Urinary tract infection without  hematuria    Hydronephrosis with renal and ureteral calculus obstruction 01/21/2016   Hyperkalemia 01/21/2016   Palliative care encounter    DNR (do not resuscitate) discussion    Encephalopathy 10/17/2015   Goals of care, counseling/discussion 10/17/2015   Nephrolithiasis 09/27/2015   Cerebral palsy ([place]) 09/27/2015   Sepsis ([place]) 09/26/2015   AKI (acute kidney injury) ([place]) 09/26/2015   Fever    Urinary tract infectious disease    Dilated cbd, acquired 09/14/2015   Hypothyroidism 09/14/2015   Hypertension 09/14/2015   Depression with anxiety 09/14/2015   UPJ obstruction, acquired 09/14/2015   CKD (chronic kidney disease), stage III ([place]) 09/14/2015   GERD (gastroesophageal reflux disease) 09/14/2015   Calculus of gallbladder without cholecystitis without obstruction    Abdominal pain, right upper quadrant 02/11/2011   Acute pyelonephritis 02/11/2011   Agoraphobia 02/11/2011   Acute renal failure ([place]) 02/11/2011   Hyponatremia 02/11/2011   Dehydration 02/11/2011    Past Surgical History:  Procedure Laterality Date   CYSTOSCOPY W/ URETERAL STENT PLACEMENT Bilateral 09/26/2015   Procedure: CYSTOSCOPY WITH Bilateral  RETROGRADE PYELOGRAM/URETERAL STENT PLACEMENT;  Surgeon: [name], MD;  Location: [facility];  Service: Urology;  Laterality: Bilateral;   CYSTOSCOPY W/ URETERAL STENT PLACEMENT Right 04/22/2018   Procedure: CYSTOSCOPY WITH RETROGRADE PYELOGRAM/URETERAL STENT PLACEMENT;  Surgeon: Cleon Gustin, MD;  Location: [facility];  Service: Urology;  Laterality: Right;   CYSTOSCOPY W/  URETERAL STENT REMOVAL Left 11/10/2015   Procedure: CYSTOSCOPY WITH STENT REMOVAL;  Surgeon: Cleon Gustin, MD;  Location: [facility];  Service: Urology;  Laterality: Left;   CYSTOSCOPY WITH RETROGRADE PYELOGRAM, URETEROSCOPY AND STENT PLACEMENT Right 06/24/2018   Procedure: CYSTOSCOPY WITH RIGHT RETROGRADE PYELOGRAM, RIGHT URETEROSCOPY AND RIGHT URETERAL STENT  EXCHANGE;  Surgeon: Cleon Gustin, MD;  Location: [facility];  Service: Urology;  Laterality: Right;   CYSTOSCOPY WITH STENT PLACEMENT Right 01/21/2016   Procedure: CYSTOSCOPY WITH STENT PLACEMENT;  Surgeon: Franchot Gallo, MD;  Location: [facility];  Service: Urology;  Laterality: Right;   CYSTOSCOPY/RETROGRADE/URETEROSCOPY/STONE EXTRACTION WITH BASKET Left 10/31/2015   Procedure: CYSTOSCOPY/ BILATERAL URETEROSCOPY/BILATERAL STONE EXTRACTION/ LEFT STENT PLACEMENT;  Surgeon: Irine Seal, MD;  Location: [facility];  Service: Urology;  Laterality: Left;   CYSTOSCOPY/URETEROSCOPY/HOLMIUM LASER/STENT PLACEMENT Right 02/27/2016   Procedure: CYSTOSCOPY/RIGHT URETEROSCOPY/HOLMIUM LASER/STENT PLACEMENT/STENT REMOVAL;  Surgeon: Irine Seal, MD;  Location: [facility];  Service: Urology;  Laterality: Right;   HOLMIUM LASER APPLICATION N/A 85/63/1497   Procedure: HOLMIUM LASER APPLICATION;  Surgeon: Irine Seal, MD;  Location: [facility];  Service: Urology;  Laterality: N/A;   HOLMIUM LASER APPLICATION Right 0/26/3785   Procedure: HOLMIUM LASER APPLICATION;  Surgeon: Cleon Gustin, MD;  Location: [facility];  Service: Urology;  Laterality: Right;   IR CV LINE INJECTION  05/06/2018   IR FLUORO GUIDE PORT INSERTION RIGHT  04/10/2016   IR US GUIDE VASC ACCESS RIGHT  04/10/2016     OB History   No obstetric history on file.     Family History  Family history unknown: Yes    Social History   Tobacco Use   Smoking status: Never Smoker   Smokeless tobacco: Never Used  Substance Use Topics   Alcohol use: No   Drug use: No    Home Medications Prior to Admission medications   Medication Sig Start Date End Date Taking? Authorizing Provider  acetaminophen (TYLENOL) 325 MG tablet Take 2 tablets (650 mg total) by mouth every 6 (six) hours as needed for mild pain (or Fever >/= 101). 06/08/18  Yes Emokpae, Courage, MD  ALPRAZolam Duanne Moron) 1 MG tablet Take 1 tablet (1 mg total) by mouth 2 (two) times daily as needed  for anxiety. Patient taking differently: Take 1 mg by mouth at bedtime. *May take one tablet twice daily as needed for anxiety 06/17/18  Yes Emokpae, Courage, MD  busPIRone (BUSPAR) 5 MG tablet Take 5 mg by mouth 2 (two) times daily. 10/23/18  Yes [provider]  chlorproMAZINE (THORAZINE) 25 MG tablet Take 25 mg by mouth at bedtime.  07/09/18  Yes [provider]  escitalopram (LEXAPRO) 20 MG tablet Take 1 tablet (20 mg total) by mouth daily. 06/17/18  Yes Emokpae, Courage, MD  levofloxacin (LEVAQUIN) 250 MG tablet Take 250 mg by mouth daily. 05/19/19  Yes [provider]  levothyroxine (SYNTHROID) 75 MCG tablet Take 75 mcg by mouth daily before breakfast.  05/18/19  Yes [provider]  MUCINEX 600 MG 12 hr tablet Take 600 mg by mouth 2 (two) times daily as needed for cough or to loosen phlegm.  01/19/19  Yes [provider]  OLANZapine (ZYPREXA) 20 MG tablet Take 20 mg by mouth at bedtime. 04/05/19  Yes [provider]  omeprazole (PRILOSEC) 20 MG capsule Take 20 mg by mouth daily before breakfast.    Yes [provider]  oxybutynin (DITROPAN-XL) 10 MG 24 hr tablet Take 10 mg by mouth every morning.  Yes [provider]  potassium chloride SA (K-DUR) 20 MEQ tablet Take 2 tablets (40 mEq total) by mouth daily. 06/17/18  Yes Emokpae, Courage, MD  torsemide (DEMADEX) 20 MG tablet Take 1 tablet (20 mg total) by mouth in the morning, at noon, and at bedtime for 5 days. For Fluid Patient taking differently: Take 20 mg by mouth in the morning and at bedtime.  04/08/19 05/29/2019 Yes Madilyn Hook A, PA-C  zolpidem (AMBIEN) 10 MG tablet Take 10 mg by mouth at bedtime. 10/23/18  Yes [provider]  ipratropium-albuterol (DUONEB) 0.5-2.5 (3) MG/3ML SOLN Take 3 mLs by nebulization every 6 (six) hours as needed (for shortness of breath/wheezing).  03/05/19   [provider]  senna-docusate (SENOKOT-S) 8.6-50 MG tablet Take 1 tablet  by mouth 2 (two) times daily. Patient taking differently: Take 1 tablet by mouth 2 (two) times daily as needed.  01/26/16   Raiford Noble Latif, DO    Allergies    Macrobid [nitrofurantoin monohyd macro], Penicillins, and Cefdinir  Review of Systems   Review of Systems  Unable to perform ROS: Patient unresponsive    Physical Exam Updated Vital Signs BP 103/70    Pulse 67    Temp (!) 92.9 F (33.8 C) (Rectal)    Resp 14    Ht 4\' 7"  (1.397 m)    Wt (!) 150 kg    SpO2 100%    BMI 76.86 kg/m   Physical Exam Vitals and nursing note reviewed.  Constitutional:      Appearance: She is obese.  HENT:     Head: Atraumatic.  Eyes:     Comments: Pupils unresponsive.  No threat response.  Cardiovascular:     Rate and Rhythm: Regular rhythm.  Pulmonary:     Comments: Harsh breath sounds but transmitted upper airway sounds. Abdominal:     General: There is no distension.  Musculoskeletal:     Cervical back: Neck supple.     Comments: Patient is obese.  Had failed IO lines bilateral anterior tibias.  Skin:    Capillary Refill: Capillary refill takes less than 2 seconds.  Neurological:     Comments: Some spontaneous breaths but requiring assisted ventilation.  No response to pain.  No corneal reflex.     ED Results / Procedures / Treatments   Labs (all labs ordered are listed, but only abnormal results are displayed) Labs Reviewed  COMPREHENSIVE METABOLIC PANEL - Abnormal; Notable for the following components:      Result Value   Glucose, Bld 238 (*)    BUN 45 (*)    Creatinine, Ser 2.24 (*)    Calcium 8.4 (*)    Total Protein 6.4 (*)    Albumin 3.3 (*)    AST 172 (*)    ALT 124 (*)    Alkaline Phosphatase 193 (*)    GFR calc non Af Amer 22 (*)    GFR calc Af Amer 25 (*)    All other components within normal limits  CBC WITH DIFFERENTIAL/PLATELET - Abnormal; Notable for the following components:   Platelets 116 (*)    Abs Immature Granulocytes 0.68 (*)    All other  components within normal limits  LACTIC ACID, PLASMA - Abnormal; Notable for the following components:   Lactic Acid, Venous 9.0 (*)    All other components within normal limits  I-STAT CHEM 8, ED - Abnormal; Notable for the following components:   Chloride 112 (*)    BUN 54 (*)  Creatinine, Ser 2.20 (*)    Glucose, Bld 224 (*)    Calcium, Ion 0.94 (*)    TCO2 20 (*)    All other components within normal limits  CULTURE, BLOOD (ROUTINE X 2)  CULTURE, BLOOD (ROUTINE X 2)  SARS CORONAVIRUS 2 BY RT PCR (HOSPITAL ORDER, [place] [facility])  URINE CULTURE  LACTIC ACID, PLASMA  URINALYSIS, ROUTINE W REFLEX MICROSCOPIC  BLOOD GAS, ARTERIAL  TROPONIN I (HIGH SENSITIVITY)  TROPONIN I (HIGH SENSITIVITY)    EKG None  Radiology DG Chest Portable 1 View  Result Date: 06/02/2019 CLINICAL DATA:  Intubation EXAM: PORTABLE CHEST 1 VIEW COMPARISON:  April 08, 2018 FINDINGS: ET tube is seen just at the level of the carina. A left-sided central venous catheter seen at the superior cavoatrial junction. There is cardiomegaly. Patchy airspace opacity seen at the right upper lung. There is mildly increased interstitial markings seen at the left upper lung. No pleural effusion. No acute osseous abnormality. NG tube is seen within the proximal stomach. IMPRESSION: ET tube just at the of the carina Multifocal airspace opacities throughout both lungs which could be due to asymmetric edema and/or infectious etiology. Electronically Signed   By: Prudencio Pair M.D.   On: 06/05/2019 21:58    Procedures Procedure Name: Intubation Date/Time: 05/21/2019 10:00 PM Performed by: Davonna Belling, MD Pre-anesthesia Checklist: Patient identified Oxygen Delivery Method: Non-rebreather mask Induction Type: Rapid sequence Ventilation: Mask ventilation without difficulty Laryngoscope Size: Glidescope and 4 Tube type: Subglottic suction tube Tube size: 7.0 mm Number of attempts: 2 Placement  Confirmation: ETT inserted through vocal cords under direct vision Secured at: 23 cm Tube secured with: ETT holder Difficulty Due To: Difficult Airway-  due to edematous airway Comments: Patient was difficult intubation due to airway edema.  May be secondary to oral airway she had had.  Unknown if this would be a chronic event or just do to this occasion.      (including critical care time)  Medications Ordered in ED Medications  norepinephrine (LEVOPHED) 4mg  in 234mL premix infusion (has no administration in time range)  EPINEPHrine (ADRENALIN) 1 MG/10ML injection (0.25 mg Intravenous Given 05/31/2019 2123)  etomidate (AMIDATE) injection (20 mg Intravenous Given 06/03/2019 2123)  rocuronium (ZEMURON) injection (100 mg Intravenous Given 05/11/2019 2123)  lactated ringers bolus 1,000 mL (1,000 mLs Intravenous New Bag/Given 05/17/2019 2146)    ED Course  I have reviewed the triage vital signs and the nursing notes.  Pertinent labs & imaging results that were available during my care of the patient were reviewed by me and considered in my medical decision making (see chart for details).    MDM Rules/Calculators/A&P                      Patient presented after cardiac arrest.  Had had somewhat prolonged downtime but was getting CPR during it.  Intubated by myself and was difficult.  Had glottic edema and some posterior pharyngeal blood and swelling. Somewhat difficult to get blood pressure from.  Somewhat due to need for wrist cuff.  Has blood pressure does appear to be around 119 systolic however even though the blood pressure is showing around 60 on the cuff.  However can only get this by watching when the pulse ox will go away with inflating the cuff on the same side. Lactic acid is elevated.  Has chronic Foley.  Has hypothermia but did have somewhat prolonged downtime.  Discussed with patient's sister  and brother.  Patient was made a DNR.  Would not want compressions or defibrillation if heart  stops.  Patient does have a port on her right chest however.  We will access this for pressors if needed.  X-ray showed likely edema.  Infection felt less likely and not empirically started on antibiotics.  Since started after choking we will not empirically start antibiotics.  However did have recent UTI with Enterococcus faecalis that was pansensitive. Discussed with intensivist in the box and thinks patient is stable to stay up at [facility] to be rounded on by the intensivist tomorrow.  Discussed with Dr. Olevia Bowens, who will admit the patient.  CRITICAL CARE Performed by: Davonna Belling Total critical care time: 30 minutes Critical care time was exclusive of separately billable procedures and treating other patients. Critical care was necessary to treat or prevent imminent or life-threatening deterioration. Critical care was time spent personally by me on the following activities: development of treatment plan with patient and/or surrogate as well as nursing, discussions with consultants, evaluation of patient's response to treatment, examination of patient, obtaining history from patient or surrogate, ordering and performing treatments and interventions, ordering and review of laboratory studies, ordering and review of radiographic studies, pulse oximetry and re-evaluation of patient's condition.   Final Clinical Impression(s) / ED Diagnoses Final diagnoses:  Cardiac arrest [facility])  Choking episode    Rx / DC Orders ED Discharge Orders    None       Davonna Belling, MD 05/12/2019 2308

## 2019-05-24 ENCOUNTER — Encounter (HOSPITAL_COMMUNITY): Payer: Self-pay | Admitting: Internal Medicine

## 2019-05-24 DIAGNOSIS — Z7189 Other specified counseling: Secondary | ICD-10-CM

## 2019-05-24 DIAGNOSIS — I469 Cardiac arrest, cause unspecified: Secondary | ICD-10-CM

## 2019-05-24 DIAGNOSIS — R0989 Other specified symptoms and signs involving the circulatory and respiratory systems: Secondary | ICD-10-CM

## 2019-05-24 DIAGNOSIS — I509 Heart failure, unspecified: Secondary | ICD-10-CM

## 2019-05-24 DIAGNOSIS — R7401 Elevation of levels of liver transaminase levels: Secondary | ICD-10-CM

## 2019-05-24 DIAGNOSIS — Z66 Do not resuscitate: Secondary | ICD-10-CM

## 2019-05-24 DIAGNOSIS — T68XXXD Hypothermia, subsequent encounter: Secondary | ICD-10-CM

## 2019-05-24 DIAGNOSIS — D696 Thrombocytopenia, unspecified: Secondary | ICD-10-CM | POA: Diagnosis present

## 2019-05-24 DIAGNOSIS — Z515 Encounter for palliative care: Secondary | ICD-10-CM

## 2019-05-24 DIAGNOSIS — G808 Other cerebral palsy: Secondary | ICD-10-CM

## 2019-05-24 DIAGNOSIS — T17908A Unspecified foreign body in respiratory tract, part unspecified causing other injury, initial encounter: Secondary | ICD-10-CM

## 2019-05-24 DIAGNOSIS — R9431 Abnormal electrocardiogram [ECG] [EKG]: Secondary | ICD-10-CM

## 2019-05-24 DIAGNOSIS — E039 Hypothyroidism, unspecified: Secondary | ICD-10-CM

## 2019-05-24 DIAGNOSIS — G931 Anoxic brain damage, not elsewhere classified: Secondary | ICD-10-CM | POA: Diagnosis present

## 2019-05-24 DIAGNOSIS — I4901 Ventricular fibrillation: Secondary | ICD-10-CM | POA: Diagnosis present

## 2019-05-24 DIAGNOSIS — T68XXXA Hypothermia, initial encounter: Secondary | ICD-10-CM

## 2019-05-24 DIAGNOSIS — N184 Chronic kidney disease, stage 4 (severe): Secondary | ICD-10-CM

## 2019-05-24 LAB — BLOOD GAS, ARTERIAL
Acid-Base Excess: 4.7 mmol/L — ABNORMAL HIGH (ref 0.0–2.0)
Bicarbonate: 27.3 mmol/L (ref 20.0–28.0)
FIO2: 50
O2 Saturation: 97.2 %
Patient temperature: 34
pCO2 arterial: 63.5 mmHg — ABNORMAL HIGH (ref 32.0–48.0)
pH, Arterial: 7.305 — ABNORMAL LOW (ref 7.350–7.450)
pO2, Arterial: 103 mmHg (ref 83.0–108.0)

## 2019-05-24 LAB — URINALYSIS, ROUTINE W REFLEX MICROSCOPIC
Bilirubin Urine: NEGATIVE
Glucose, UA: NEGATIVE mg/dL
Ketones, ur: 5 mg/dL — AB
Nitrite: NEGATIVE
Protein, ur: 30 mg/dL — AB
Specific Gravity, Urine: 1.013 (ref 1.005–1.030)
pH: 7 (ref 5.0–8.0)

## 2019-05-24 LAB — COMPREHENSIVE METABOLIC PANEL
ALT: 126 U/L — ABNORMAL HIGH (ref 0–44)
AST: 182 U/L — ABNORMAL HIGH (ref 15–41)
Albumin: 3.4 g/dL — ABNORMAL LOW (ref 3.5–5.0)
Alkaline Phosphatase: 169 U/L — ABNORMAL HIGH (ref 38–126)
Anion gap: 14 (ref 5–15)
BUN: 46 mg/dL — ABNORMAL HIGH (ref 8–23)
CO2: 25 mmol/L (ref 22–32)
Calcium: 8.6 mg/dL — ABNORMAL LOW (ref 8.9–10.3)
Chloride: 108 mmol/L (ref 98–111)
Creatinine, Ser: 1.97 mg/dL — ABNORMAL HIGH (ref 0.44–1.00)
GFR calc Af Amer: 30 mL/min — ABNORMAL LOW (ref >60)
GFR calc non Af Amer: 26 mL/min — ABNORMAL LOW (ref >60)
Glucose, Bld: 125 mg/dL — ABNORMAL HIGH (ref 70–99)
Potassium: 5.5 mmol/L — ABNORMAL HIGH (ref 3.5–5.1)
Sodium: 147 mmol/L — ABNORMAL HIGH (ref 135–145)
Total Bilirubin: 1.1 mg/dL (ref 0.3–1.2)
Total Protein: 6.3 g/dL — ABNORMAL LOW (ref 6.5–8.1)

## 2019-05-24 LAB — CBC
HCT: 40.4 % (ref 36.0–46.0)
Hemoglobin: 12.3 g/dL (ref 12.0–15.0)
MCH: 30.2 pg (ref 26.0–34.0)
MCHC: 30.4 g/dL (ref 30.0–36.0)
MCV: 99.3 fL (ref 80.0–100.0)
Platelets: 115 10*3/uL — ABNORMAL LOW (ref 150–400)
RBC: 4.07 MIL/uL (ref 3.87–5.11)
RDW: 13.5 % (ref 11.5–15.5)
WBC: 7.1 10*3/uL (ref 4.0–10.5)
nRBC: 0 % (ref 0.0–0.2)

## 2019-05-24 LAB — GLUCOSE, CAPILLARY: Glucose-Capillary: 167 mg/dL — ABNORMAL HIGH (ref 70–99)

## 2019-05-24 LAB — TROPONIN I (HIGH SENSITIVITY): Troponin I (High Sensitivity): 45 ng/L — ABNORMAL HIGH (ref <18)

## 2019-05-24 LAB — LACTIC ACID, PLASMA
Lactic Acid, Venous: 3.1 mmol/L (ref 0.5–1.9)
Lactic Acid, Venous: 3.3 mmol/L (ref 0.5–1.9)

## 2019-05-24 MED ORDER — GLYCOPYRROLATE 0.2 MG/ML IJ SOLN
0.4000 mg | INTRAMUSCULAR | Status: DC
  Administered 2019-05-24: 0.4 mg via INTRAVENOUS
  Filled 2019-05-24: qty 2

## 2019-05-24 MED ORDER — POLYVINYL ALCOHOL 1.4 % OP SOLN: 1.0000 [drp] | Freq: Four times a day (QID) | OPHTHALMIC | Status: DC | PRN

## 2019-05-24 MED ORDER — MORPHINE 100MG IN NS 100ML (1MG/ML) PREMIX INFUSION
0.0000 mg/h | INTRAVENOUS | Status: DC
  Administered 2019-05-24: 5 mg/h via INTRAVENOUS
  Filled 2019-05-24: qty 100

## 2019-05-24 MED ORDER — METRONIDAZOLE IN NACL 5-0.79 MG/ML-% IV SOLN
500.0000 mg | Freq: Three times a day (TID) | INTRAVENOUS | Status: DC
  Administered 2019-05-24: 500 mg via INTRAVENOUS
  Filled 2019-05-24: qty 100

## 2019-05-24 MED ORDER — LORAZEPAM 2 MG/ML IJ SOLN
2.0000 mg | INTRAMUSCULAR | Status: AC
  Administered 2019-05-24: 2 mg via INTRAVENOUS
  Filled 2019-05-24: qty 1

## 2019-05-24 MED ORDER — ACETAMINOPHEN 325 MG PO TABS: 650.0000 mg | ORAL_TABLET | Freq: Four times a day (QID) | ORAL | Status: DC | PRN

## 2019-05-24 MED ORDER — HYDROMORPHONE HCL 1 MG/ML IJ SOLN
2.0000 mg | Freq: Once | INTRAMUSCULAR | Status: AC
  Administered 2019-05-24: 2 mg via INTRAVENOUS
  Filled 2019-05-24: qty 2

## 2019-05-24 MED ORDER — SODIUM CHLORIDE 0.9 % IV SOLN: 0.5000 mg/h | INTRAVENOUS | Status: DC

## 2019-05-24 MED ORDER — MORPHINE SULFATE (PF) 4 MG/ML IV SOLN
4.0000 mg | INTRAVENOUS | Status: AC
  Administered 2019-05-24: 4 mg via INTRAVENOUS
  Filled 2019-05-24: qty 1

## 2019-05-24 MED ORDER — GLYCOPYRROLATE 1 MG PO TABS: 1.0000 mg | ORAL_TABLET | ORAL | Status: DC | PRN

## 2019-05-24 MED ORDER — CLINDAMYCIN PHOSPHATE 600 MG/50ML IV SOLN: 600.0000 mg | Freq: Three times a day (TID) | INTRAVENOUS | Status: DC

## 2019-05-24 MED ORDER — FENTANYL CITRATE (PF) 100 MCG/2ML IJ SOLN
25.0000 ug | INTRAMUSCULAR | Status: DC | PRN
  Administered 2019-05-24: 25 ug via INTRAVENOUS
  Filled 2019-05-24 (×2): qty 2

## 2019-05-24 MED ORDER — IPRATROPIUM-ALBUTEROL 0.5-2.5 (3) MG/3ML IN SOLN: 3.0000 mL | Freq: Four times a day (QID) | RESPIRATORY_TRACT | Status: DC | PRN

## 2019-05-24 MED ORDER — SCOPOLAMINE 1 MG/3DAYS TD PT72
1.0000 | MEDICATED_PATCH | TRANSDERMAL | Status: DC
  Administered 2019-05-24: 1.5 mg via TRANSDERMAL
  Filled 2019-05-24 (×2): qty 1

## 2019-05-24 MED ORDER — HYDROMORPHONE HCL 1 MG/ML IJ SOLN: 2.0000 mg | INTRAMUSCULAR | Status: DC | PRN

## 2019-05-24 MED ORDER — ORAL CARE MOUTH RINSE
15.0000 mL | OROMUCOSAL | Status: DC
  Administered 2019-05-24 (×3): 15 mL via OROMUCOSAL

## 2019-05-24 MED ORDER — HYDROMORPHONE BOLUS VIA INFUSION: 2.0000 mg | INTRAVENOUS | Status: DC | PRN

## 2019-05-24 MED ORDER — GLYCOPYRROLATE 0.2 MG/ML IJ SOLN
0.2000 mg | Freq: Once | INTRAMUSCULAR | Status: AC
  Administered 2019-05-24: 0.2 mg via INTRAVENOUS

## 2019-05-24 MED ORDER — GLYCOPYRROLATE 0.2 MG/ML IJ SOLN: 0.2000 mg | INTRAMUSCULAR | Status: DC | PRN

## 2019-05-24 MED ORDER — MORPHINE BOLUS VIA INFUSION
2.0000 mg | INTRAVENOUS | Status: DC | PRN
  Filled 2019-05-24: qty 2

## 2019-05-24 MED ORDER — GLYCOPYRROLATE 0.2 MG/ML IJ SOLN
0.2000 mg | INTRAMUSCULAR | Status: DC | PRN
  Administered 2019-05-24: 0.2 mg via INTRAVENOUS
  Filled 2019-05-24 (×2): qty 1

## 2019-05-24 MED ORDER — MAGNESIUM SULFATE 2 GM/50ML IV SOLN
2.0000 g | Freq: Once | INTRAVENOUS | Status: AC
  Administered 2019-05-24: 2 g via INTRAVENOUS
  Filled 2019-05-24: qty 50

## 2019-05-24 MED ORDER — DIPHENHYDRAMINE HCL 50 MG/ML IJ SOLN: 25.0000 mg | INTRAMUSCULAR | Status: DC | PRN

## 2019-05-24 MED ORDER — CLINDAMYCIN PHOSPHATE 900 MG/50ML IV SOLN: 900.0000 mg | Freq: Four times a day (QID) | INTRAVENOUS | Status: DC

## 2019-05-24 MED ORDER — HYDROMORPHONE HCL 1 MG/ML IJ SOLN
1.0000 mg | INTRAMUSCULAR | Status: DC
  Administered 2019-05-24: 1 mg via INTRAVENOUS
  Filled 2019-05-24: qty 1

## 2019-05-24 MED ORDER — IPRATROPIUM BROMIDE 0.02 % IN SOLN
RESPIRATORY_TRACT | Status: AC
  Administered 2019-05-24: 0.5 mg
  Filled 2019-05-24: qty 2.5

## 2019-05-24 MED ORDER — ALBUTEROL SULFATE (2.5 MG/3ML) 0.083% IN NEBU
INHALATION_SOLUTION | RESPIRATORY_TRACT | Status: AC
  Administered 2019-05-24: 2.5 mg
  Filled 2019-05-24: qty 3

## 2019-05-24 MED ORDER — ACETAMINOPHEN 650 MG RE SUPP: 650.0000 mg | Freq: Four times a day (QID) | RECTAL | Status: DC | PRN

## 2019-05-24 MED ORDER — CHLORHEXIDINE GLUCONATE 0.12% ORAL RINSE (MEDLINE KIT)
15.0000 mL | Freq: Two times a day (BID) | OROMUCOSAL | Status: DC
  Administered 2019-05-24: 15 mL via OROMUCOSAL

## 2019-05-24 MED ORDER — SODIUM CHLORIDE 0.9 % IV SOLN
2.0000 g | INTRAVENOUS | Status: DC
  Administered 2019-05-24: 2 g via INTRAVENOUS
  Filled 2019-05-24: qty 20

## 2019-05-24 MED ORDER — LORAZEPAM 2 MG/ML IJ SOLN
2.0000 mg | INTRAMUSCULAR | Status: DC | PRN
  Administered 2019-05-24: 2 mg via INTRAVENOUS
  Filled 2019-05-24: qty 1

## 2019-05-25 LAB — HEMOGLOBIN A1C
Hgb A1c MFr Bld: 5.6 % (ref 4.8–5.6)
Mean Plasma Glucose: 114 mg/dL

## 2019-05-26 ENCOUNTER — Ambulatory Visit: Payer: Medicare Other | Admitting: Urology

## 2019-05-26 LAB — URINE CULTURE: Culture: 80000 — AB

## 2019-05-26 MED FILL — Medication: Qty: 1 | Status: AC

## 2019-05-28 LAB — CULTURE, BLOOD (ROUTINE X 2)
Culture: NO GROWTH
Culture: NO GROWTH
Special Requests: ADEQUATE
Special Requests: ADEQUATE

## 2019-05-31 ENCOUNTER — Ambulatory Visit: Payer: Medicare Other | Admitting: Urology

## 2019-06-08 NOTE — Progress Notes (Signed)
Have given Patient albuterol /atrovent nebulizer for wheezing. Neb has been ordered Q6 prn for wheezing. Patient appears to be arousing, opening eyes.

## 2019-06-08 NOTE — Progress Notes (Signed)
Time of death 22. Dr. Wynetta Emery notified and Mariana Kaufman, NP notified. Elink notified and will call [facility].

## 2019-06-08 NOTE — ED Notes (Signed)
Family can be reached at the following numbers:   [name] [phone], [name], Rachel Morgan [phone]

## 2019-06-08 NOTE — Progress Notes (Signed)
Patient clearly uncomfortable on ventilator, opening eyes however not responsive. Received verbal order for 25 mcg fentanyl prn. Went to administer fentanyl however BP noted to have come down to 78/14 (21). Levophed increased from 11 mcg/kg/min to 20 mc/kg/min. Discussed with sister, who is at bedside, about administering fentanyl to patient for comfort even with BP being low. Sister gave consent stating she wants Rachel Morgan to be comfortable would like to keep ventilator in until family arrives and has time to say goodbye. Once BP came up to 90/49 (60) Fentanyl prn 25 mcg administered. Patient noted to be more comfortable and levophed increased to 23 mcg/kg/min.  Dr. Wynetta Emery notified and came to bedside to speak with family. Chaplain and palliative also notified and on the way to speak with family.

## 2019-06-08 NOTE — Progress Notes (Signed)
Present with family for spiritual and emotional support during this day.

## 2019-06-08 NOTE — Consult Note (Signed)
Consultation Note Date: 2019/05/25   Patient Name: Rachel Morgan  DOB: 06-24-1950  MRN: [id number]  Age / Sex: 69 y.o., female  PCP: [facility] Referring Physician: Murlean Iba, MD  Reason for Consultation: Establishing goals of care and Withdrawal of life-sustaining treatment  HPI/Patient Profile: 69 y.o. female  with past medical history of cerebal palsy, DM, GERD, CHF, hypothyroid, HTN admitted on 06/01/2019 after choking on a cookie resulting in respiratory and cardiac arrest requiring 50 mins of CPR, shock and intubation with eventual ROSC. On presentaton to ED was hypotensive and hypothermic, unresponsive. Made DNR in ED. Plan made to give 24 hrs for outcomes. This morning she was having difficulty maintaining blood pressures on max pressors and looking very uncomfortable. Opening eyes, nonpurposeful limb movements, biting tube, asynchronous with vent but unable to give meds for comfort due to hypotension. Appears to have anoxic brain injury.  Palliative consulted for goals of care.    Clinical Assessment and Goals of Care:  I have reviewed medical records including EPIC notes, labs and imaging, received report from Dr. Wynetta Emery and from [place], [place], examined the patient and met at bedside with patient's family members to discuss diagnosis prognosis, [place], EOL wishes, disposition and options. Patient's sister is her legal guardian and primary caretaker.  Patient has been living in the home with her sister- dependent for care. She has been in and out of the hospital frequently.   Prior to my arrival family had made decision for compassionate extubation and to allow for patient's comfortable transition and dying process. Multiple family members at bedside to say goodbye.  I answered Jasia's questions regarding extubation process and comfort medications.   I remained at bedside  as patient was extubated, required multiple boluses and rotation of opioids until comfort was achieved. Tequila verbalized that comfort was priority.   When I left patient appeared comfortable. I educated family on dying process and signs of discomfort to look for and to notify nursing so that further comfort medications could be given if needed.   Primary Decision Maker LEGAL GUARDIAN    SUMMARY OF RECOMMENDATIONS -Continue comfort medications as ordered- scheduled hydromorphone, scheduled robinul, generous use of lorazepam -DNR      Code Status/Advance Care Planning:  DNR  Palliative Prophylaxis:   Frequent Pain Assessment  Additional Recommendations (Limitations, Scope, Preferences):  Full Comfort Care  Psycho-social/Spiritual:   Desire for further Chaplaincy support:yes  Additional Recommendations: Grief/Bereavement Support  Prognosis:    Hours - Days  Discharge Planning: Anticipated Hospital Death  Primary Diagnoses: Present on Admission: . Cardiac arrest with successful resuscitation ([place]) . Cerebral palsy ([place]) . Hypothyroidism . Lactic acidosis . CKD (chronic kidney disease), stage IV ([place]) . Transaminitis . Prolonged QT interval . Aspiration into airway . Thrombocytopenia ([place])   I have reviewed the medical record, interviewed the patient and family, and examined the patient. The following aspects are pertinent.  Past Medical History:  Diagnosis Date  . Anxiety   . Calculus of gallbladder   .  Cerebral palsy ([place])   . Chronic kidney disease   . Diabetes mellitus   . Gall stones   . GERD (gastroesophageal reflux disease)   . High cholesterol   . History of kidney stones   . Hypertension   . Hypothyroidism   . Kidney stones   . MR (mental retardation)   . Pyelonephritis   . Sepsis [facility])    Social History   Socioeconomic History  . Marital status: Single    Spouse name: Not on file  . Number of children: Not on file  . Years of  education: Not on file  . Highest education level: Not on file  Occupational History  . Not on file  Tobacco Use  . Smoking status: Never Smoker  . Smokeless tobacco: Never Used  Substance and Sexual Activity  . Alcohol use: No  . Drug use: No  . Sexual activity: Never    Birth control/protection: None  Other Topics Concern  . Not on file  Social History Narrative  . Not on file   Social Determinants of Health   Financial Resource Strain:   . Difficulty of Paying Living Expenses:   Food Insecurity:   . Worried About Charity fundraiser in the Last Year:   . Arboriculturist in the Last Year:   Transportation Needs:   . Film/video editor (Medical):   Marland Kitchen Lack of Transportation (Non-Medical):   Physical Activity:   . Days of Exercise per Week:   . Minutes of Exercise per Session:   Stress:   . Feeling of Stress :   Social Connections:   . Frequency of Communication with Friends and Family:   . Frequency of Social Gatherings with Friends and Family:   . Attends Religious Services:   . Active Member of Clubs or Organizations:   . Attends Archivist Meetings:   Marland Kitchen Marital Status:    Family History  Family history unknown: Yes   Scheduled Meds: . LORazepam  2 mg Intravenous NOW  . mouth rinse  15 mL Mouth Rinse 10 times per day   Continuous Infusions: . morphine     PRN Meds:.acetaminophen **OR** acetaminophen, diphenhydrAMINE, glycopyrrolate **OR** glycopyrrolate **OR** glycopyrrolate, LORazepam, morphine, polyvinyl alcohol Medications Prior to Admission:  Prior to Admission medications   Medication Sig Start Date End Date Taking? Authorizing Provider  acetaminophen (TYLENOL) 325 MG tablet Take 2 tablets (650 mg total) by mouth every 6 (six) hours as needed for mild pain (or Fever >/= 101). 06/08/18  Yes Emokpae, Courage, MD  ALPRAZolam Duanne Moron) 1 MG tablet Take 1 tablet (1 mg total) by mouth 2 (two) times daily as needed for anxiety. Patient taking  differently: Take 1 mg by mouth at bedtime. *May take one tablet twice daily as needed for anxiety 06/17/18  Yes Emokpae, Courage, MD  busPIRone (BUSPAR) 5 MG tablet Take 5 mg by mouth 2 (two) times daily. 10/23/18  Yes [provider]  chlorproMAZINE (THORAZINE) 25 MG tablet Take 25 mg by mouth at bedtime.  07/09/18  Yes [provider]  escitalopram (LEXAPRO) 20 MG tablet Take 1 tablet (20 mg total) by mouth daily. 06/17/18  Yes Emokpae, Courage, MD  levofloxacin (LEVAQUIN) 250 MG tablet Take 250 mg by mouth daily. 05/19/19  Yes [provider]  levothyroxine (SYNTHROID) 75 MCG tablet Take 75 mcg by mouth daily before breakfast.  05/18/19  Yes [provider]  MUCINEX 600 MG 12 hr tablet Take 600 mg by  mouth 2 (two) times daily as needed for cough or to loosen phlegm.  01/19/19  Yes [provider]  OLANZapine (ZYPREXA) 20 MG tablet Take 20 mg by mouth at bedtime. 04/05/19  Yes [provider]  omeprazole (PRILOSEC) 20 MG capsule Take 20 mg by mouth daily before breakfast.    Yes [provider]  oxybutynin (DITROPAN-XL) 10 MG 24 hr tablet Take 10 mg by mouth every morning.    Yes [provider]  potassium chloride SA (K-DUR) 20 MEQ tablet Take 2 tablets (40 mEq total) by mouth daily. 06/17/18  Yes Emokpae, Courage, MD  torsemide (DEMADEX) 20 MG tablet Take 1 tablet (20 mg total) by mouth in the morning, at noon, and at bedtime for 5 days. For Fluid Patient taking differently: Take 20 mg by mouth in the morning and at bedtime.  04/08/19 05/17/2019 Yes Madilyn Hook A, PA-C  zolpidem (AMBIEN) 10 MG tablet Take 10 mg by mouth at bedtime. 10/23/18  Yes [provider]  ipratropium-albuterol (DUONEB) 0.5-2.5 (3) MG/3ML SOLN Take 3 mLs by nebulization every 6 (six) hours as needed (for shortness of breath/wheezing).  03/05/19   [provider]  senna-docusate (SENOKOT-S) 8.6-50 MG tablet Take 1 tablet by mouth 2 (two) times  daily. Patient taking differently: Take 1 tablet by mouth 2 (two) times daily as needed.  01/26/16   Raiford Noble Latif, DO   Allergies  Allergen Reactions  . Macrobid [Nitrofurantoin Monohyd Macro] Hives and Swelling  . Penicillins Swelling and Rash  . Cefdinir Hives   Review of Systems  Unable to perform ROS: Acuity of condition    Physical Exam Vitals and nursing note reviewed.  Constitutional:      Appearance: She is ill-appearing.  Cardiovascular:     Rate and Rhythm: Normal rate and regular rhythm.  Pulmonary:     Comments: Increased effort, terminal secretions Musculoskeletal:     Comments: Diffuse anasarca  Skin:    Comments: Large red bruise center of chest  Neurological:     Comments: Nonverbal, nonpurposeful movements     Vital Signs: BP 98/74   Pulse (!) 102   Temp (!) 90.3 F (32.4 C) (Rectal)   Resp (!) 28   Ht _0  (1.397 m)   Wt 114.6 kg   SpO2 95%   BMI 58.72 kg/m  Pain Scale: CPOT       SpO2: SpO2: 95 % O2 Device:SpO2: 95 % O2 Flow Rate: .   IO: Intake/output summary:   Intake/Output Summary (Last 24 hours) at 2019/06/08 1018 Last data filed at Jun 08, 2019 5035 Gross per 24 hour  Intake 4220.51 ml  Output --  Net 4220.51 ml    LBM:   Baseline Weight: Weight: (!) 150 kg Most recent weight: Weight: 114.6 kg     Palliative Assessment/Data: PPS: 10%     Thank you for this consult. Palliative medicine will continue to follow and assist as needed.   Time In: 0930  Time Out: 1200 Time Total: 150 minutes Prolonged services billed: yes Greater than 50%  of this time was spent counseling and coordinating care related to the above assessment and plan.  Signed by: Mariana Kaufman, AGNP-C Palliative Medicine    Please contact Palliative Medicine Team phone at [phone] for questions and concerns.  For individual provider: See Shea Evans

## 2019-06-08 NOTE — Progress Notes (Signed)
eLink Physician-Brief Progress Note Patient Name: Rachel Morgan DOB: Dec 24, 1950 MRN: [id number]   Date of Service  Jun 11, 2019  HPI/Events of Note  71 F history of hypertension, DM, dyslipidemia, CKD, cerebral palsy, brought in after a witnessed arrest after choking on a cookie. ROSC after approximately 50 mins. She is autocooled at [address] F. 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  eICU Interventions   Continue supportive care  Goals of care to be discussed in am     Intervention Category Major Interventions: Respiratory failure - evaluation and management;Arrhythmia - evaluation and management Evaluation Type: New Patient Evaluation  Judd Lien 06/11/19, 3:29 AM

## 2019-06-08 NOTE — Progress Notes (Addendum)
PROGRESS NOTE   Rachel Morgan  JKK:[id number] DOB: 07-Oct-1950 DOA: 05/31/2019 PCP: [facility]   Chief Complaint  Patient presents with   Cardiac Arrest    Brief Admission History:  69 y.o. female with medical history significant of anxiety, cholelithiasis, cerebral palsy, chronic kidney disease, type 2 diabetes, GERD, hyperlipidemia, or lithiasis, hypertension, hypothyroidism, MR, history of pyelonephritis who was brought to the emergency department by [facility] who were called to the scene due to the patient getting choked on a cookie.  Patient was defibrillated with an AED by the fire department before the EMS crew arrived and she was in pulseless electrical activity.  CPR was done for 15 minutes with return of pulses after receiving 60 mg of epinephrine by EMS.  They placed an advanced airway tube, but it got kinked and was lost in route to the emergency department.  She received at least 3 more rounds of epinephrine and the emergency department.  She is currently comatose and unable to provide further history.  Assessment & Plan:   Principal Problem:   Cardiac arrest with successful resuscitation [facility]) Active Problems:   Hypothyroidism   Cerebral palsy ([place])   Lactic acidosis   CKD (chronic kidney disease), stage IV (HCC)   Transaminitis   Prolonged QT interval   Aspiration into airway   Thrombocytopenia (HCC)   DNR (do not resuscitate)   Anoxic brain injury ([place])   Ventricular fibrillation ([place])   Choking episode  S/p cardiac arrest after acute choking spell - Pt s/p defibrillation with AED and subsequent 15 minutes of CPR and 9 mg epinephrine before ROSC.  She is having spontaneous jerk movements and uncoordinated eye movements, strongly suspect anoxic brain injury.  Pt now made full comfort care after speaking with palliative medicine team. Continue full comfort care orders.  Family requested EEG for brain activity evaluation and that has  been ordered.  Compassionate extubation per family wishes.   Aspiration into airway - Pt apparently had severe choking spell after eating cookie.  Stage IV CKD  Lactic acidosis - likely from prolonged hypoxia, acute hypoxic respiratory failure, CPR and interventions DNR  Hypothermia Hypotension  Cerebral palsy Stress hyperglycemia Hypothyroidism Abnormal EKG / Prolonged QTc Thrombocytopenia  Chronic diastolic CHF  Code Status:  DNR  Family Communication: bedside updates Disposition:   Status is: Inpatient  Remains inpatient appropriate because:Hemodynamically unstable  Dispo: The patient is from: Home              Anticipated d/c is to:  TBD              Anticipated d/c date is: 1 day              Patient currently is not medically stable to d/c.  Consultants:  Palliative medicine Pccm    Objective: Vitals:   06-15-2019 1215 2019-06-15 1230 June 15, 2019 1233 06-15-19 1245  BP:   (!) 74/59   Pulse: (!) 51     Resp: (!) 27 (!) 21 (!) 21 (!) 23  Temp:      TempSrc:      SpO2: (!) 62%     Weight:      Height:        Intake/Output Summary (Last 24 hours) at 06/15/2019 1305 Last data filed at 15-Jun-2019 1239 Gross per 24 hour  Intake 4470.09 ml  Output --  Net 4470.09 ml   Filed Weights   05/16/2019 2134 2019/06/15 0241  Weight: Marland Kitchen)  150 kg 114.6 kg   Examination:  General exam: critically ill patient appears terminally ill, spontaneous eye and head jerking.  Respiratory system: shallow BS bilateral. Respiratory effort normal. Cardiovascular system: normal s1 & S2 heard. Gastrointestinal system: Abdomen is mildly distended. Normal bowel sounds heard. Central nervous system:  Unresponsive.  Extremities: no gross lesions. Skin: no gross abnormalities.  Psychiatry: UTO.   Data Reviewed: I have personally reviewed following labs and imaging studies  CBC: Recent Labs  Lab 05/19/19 1505 05/19/19 1928 05/30/2019 2121 05/10/2019 2130 Jun 15, 2019 0416  WBC 4.9  --   --  10.0  7.1  NEUTROABS 3.6  --   --  7.6  --   HGB 12.3 11.9* 12.6 12.1 12.3  HCT 39.7 35.0* 37.0 38.7 40.4  MCV 97.1  --   --  98.5 99.3  PLT 161  --   --  116* 115*    Basic Metabolic Panel: Recent Labs  Lab 05/19/19 1505 05/19/19 1505 05/19/19 1906 05/19/19 1928 05/28/2019 2121 06/02/2019 2130 06-15-19 0416  NA 141  --   --  142 143 142 147*  K 5.4*  --   --  5.0 5.1 4.8 5.5*  CL 104  --   --  105 112* 106 108  CO2 28  --   --   --   --  22 25  GLUCOSE 116*  --   --  99 224* 238* 125*  BUN 43*  --   --  39* 54* 45* 46*  CREATININE 2.06*   < > 2.10* 2.20* 2.20* 2.24* 1.97*  CALCIUM 9.2  --   --   --   --  8.4* 8.6*   < > = values in this interval not displayed.    GFR: Estimated Creatinine Clearance: 29 mL/min (A) (by C-G formula based on SCr of 1.97 mg/dL (H)).  Liver Function Tests: Recent Labs  Lab 05/17/2019 2130 2019/06/15 0416  AST 172* 182*  ALT 124* 126*  ALKPHOS 193* 169*  BILITOT 0.7 1.1  PROT 6.4* 6.3*  ALBUMIN 3.3* 3.4*    CBG: Recent Labs  Lab 15-Jun-2019 0658  GLUCAP 167*    Recent Results (from the past 240 hour(s))  Urine Culture     Status: Abnormal   Collection Time: 05/19/19  3:03 PM   Specimen: Urine, Clean Catch  Result Value Ref Range Status   Specimen Description   Final    URINE, CLEAN CATCH Performed at [facility], [address], [place], [place] [address]    Special Requests   Final    Immunocompromised Performed at [facility], [address], [place], [place] [address]    Culture >=100,000 COLONIES/mL ENTEROCOCCUS FAECALIS (A)  Final   Report Status 05/22/2019 FINAL  Final   Organism ID, Bacteria ENTEROCOCCUS FAECALIS (A)  Final      Susceptibility   Enterococcus faecalis - MIC*    AMPICILLIN <=2 SENSITIVE Sensitive     NITROFURANTOIN <=16 SENSITIVE Sensitive     VANCOMYCIN 1 SENSITIVE Sensitive     * >=100,000 COLONIES/mL ENTEROCOCCUS FAECALIS  SARS Coronavirus 2 by RT PCR (hospital order, performed in [place] hospital lab)  Nasopharyngeal Nasopharyngeal Swab     Status: None   Collection Time: 05/31/2019  9:37 PM   Specimen: Nasopharyngeal Swab  Result Value Ref Range Status   SARS Coronavirus 2 NEGATIVE NEGATIVE Final    Comment: (NOTE) SARS-CoV-2 target nucleic acids are NOT DETECTED. The SARS-CoV-2 RNA is generally detectable in upper  and lower respiratory specimens during the acute phase of infection. The lowest concentration of SARS-CoV-2 viral copies this assay can detect is 250 copies / mL. A negative result does not preclude SARS-CoV-2 infection and should not be used as the sole basis for treatment or other patient management decisions.  A negative result may occur with improper specimen collection / handling, submission of specimen other than nasopharyngeal swab, presence of viral mutation(s) within the areas targeted by this assay, and inadequate number of viral copies (<250 copies / mL). A negative result must be combined with clinical observations, patient history, and epidemiological information. Fact Sheet for Patients:   StrictlyIdeas.no Fact Sheet for Healthcare Providers: BankingDealers.co.za This test is not yet approved or cleared  by the [place] FDA and has been authorized for detection and/or diagnosis of SARS-CoV-2 by FDA under an Emergency Use Authorization (EUA).  This EUA will remain in effect (meaning this test can be used) for the duration of the COVID-19 declaration under Section 564(b)(1) of the Act, 21 U.S.C. section 360bbb-3(b)(1), unless the authorization is terminated or revoked sooner. Performed at [facility], [address] [place], [place] [address]   Culture, blood (routine x 2)     Status: None (Preliminary result)   Collection Time: 05/19/2019  9:55 PM   Specimen: Left Antecubital; Blood  Result Value Ref Range Status   Specimen Description LEFT ANTECUBITAL  Final   Special Requests   Final    BOTTLES DRAWN  AEROBIC AND ANAEROBIC Blood Culture adequate volume   Culture   Final    NO GROWTH < 12 HOURS Performed at [facility], [address] [place], [place] [address]    Report Status PENDING  Incomplete  Culture, blood (routine x 2)     Status: None (Preliminary result)   Collection Time: 05/11/2019  9:56 PM   Specimen: BLOOD RIGHT ARM  Result Value Ref Range Status   Specimen Description BLOOD RIGHT ARM  Final   Special Requests   Final    BOTTLES DRAWN AEROBIC AND ANAEROBIC Blood Culture adequate volume   Culture   Final    NO GROWTH < 12 HOURS Performed at [facility], [address] [place], [place] [address]    Report Status PENDING  Incomplete     Radiology Studies: CT Head Wo Contrast  Result Date: June 14, 2019 CLINICAL DATA:  Encephalopathy. Cardiac arrest. EXAM: CT HEAD WITHOUT CONTRAST TECHNIQUE: Contiguous axial images were obtained from the base of the skull through the vertex without intravenous contrast. COMPARISON:  07/23/2018 FINDINGS: Brain: There is an old left frontal lobe infarct. No acute hemorrhage or extra-axial collection. No midline shift or other mass effect. Generalized volume loss. Vascular: No abnormal hyperdensity of the major intracranial arteries or dural venous sinuses. No intracranial atherosclerosis. Skull: The visualized skull base, calvarium and extracranial soft tissues are normal. Sinuses/Orbits: No fluid levels or advanced mucosal thickening of the visualized paranasal sinuses. No mastoid or middle ear effusion. The orbits are normal. IMPRESSION: 1. No acute intracranial abnormality. 2. Old left frontal lobe infarct. Electronically Signed   By: Ulyses Jarred M.D.   On: 2019-06-14 02:17   DG Chest Portable 1 View  Result Date: 05/19/2019 CLINICAL DATA:  Intubation EXAM: PORTABLE CHEST 1 VIEW COMPARISON:  April 08, 2018 FINDINGS: ET tube is seen just at the level of the carina. A left-sided central venous catheter seen at the superior cavoatrial junction.  There is cardiomegaly. Patchy airspace opacity seen at the right upper lung. There  is mildly increased interstitial markings seen at the left upper lung. No pleural effusion. No acute osseous abnormality. NG tube is seen within the proximal stomach. IMPRESSION: ET tube just at the of the carina Multifocal airspace opacities throughout both lungs which could be due to asymmetric edema and/or infectious etiology. Electronically Signed   By: Prudencio Pair M.D.   On: 05/15/2019 21:58   Scheduled Meds:  glycopyrrolate  0.4 mg Intravenous Q4H    HYDROmorphone (DILAUDID) injection  1 mg Intravenous Q1H   mouth rinse  15 mL Mouth Rinse 10 times per day   scopolamine  1 patch Transdermal Q72H   Continuous Infusions:    LOS: 1 day   Critical Care Procedure Note Authorized and Performed by: Murvin Natal MD  Total Critical Care time:  50 mins Due to a high probability of clinically significant, life threatening deterioration, the patient required my highest level of preparedness to intervene emergently and I personally spent this critical care time directly and personally managing the patient.  This critical care time included obtaining a history; examining the patient, pulse oximetry; ordering and review of studies; arranging urgent treatment with development of a management plan; evaluation of patient's response of treatment; frequent reassessment; and discussions with other providers.  This critical care time was performed to assess and manage the high probability of imminent and life threatening deterioration that could result in multi-organ failure.  It was exclusive of separately billable procedures and treating other patients and teaching time.   Irwin Brakeman, MD How to contact the [facility] Attending or Consulting provider [place] or covering provider during after hours [place], for this patient?  Check the care team in [facility] and look for a) attending/consulting TR[facility] provider listed and b) the [facility] team  listed Log into [email or web address] and use [place]'s universal password to access. If you do not have the password, please contact the hospital operator. Locate the [facility] provider you are looking for under [facility] and page to a number that you can be directly reached. If you still have difficulty reaching the provider, please page the [place] (Director on Call) for the Hospitalists listed on amion for assistance.  06/05/2019, 1:05 PM

## 2019-06-08 NOTE — Procedures (Signed)
Extubation Procedure Note  Patient Details:   Name: Rachel Morgan DOB: Oct 20, 1950 MRN: [id number]   Airway Documentation:  Airway 7 mm (Active)  Secured at (cm) 24 cm 06-15-19 0821  Measured From Lips 06/15/19 [place] Jun 15, 2019 0821  Secured By [employer] 06-15-2019 0821  Tube Holder Repositioned Yes Jun 15, 2019 0821  Cuff Pressure (cm H2O) 28 cm H2O 06/15/19 0821  Site Condition Dry 15-Jun-2019 0500   Vent end date: 06-15-19 Vent end time: 1105   Evaluation  O2 sats: stable throughout Complications: No apparent complications Patient did tolerate procedure well. Bilateral Breath Sounds: Rhonchi   No  Josiah Lobo [facility] Jun 15, 2019, 12:04 PM

## 2019-06-08 NOTE — Death Summary Note (Signed)
DEATH SUMMARY   Patient Details  Name: Rachel Morgan MRN: [id number] DOB: 13-Apr-1950  Admission/Discharge Information   Admit Date:  06/14/19  Date of Death: Date of Death: 2019-06-15  Time of Death: Time of Death: Apr 18, 1503  Length of Stay: 1  Referring Physician: [facility]   Reason(s) for Hospitalization  ADMISSION HPI: Rachel Morgan is a 69 y.o. female with medical history significant of anxiety, cholelithiasis, cerebral palsy, chronic kidney disease, type 2 diabetes, GERD, hyperlipidemia, or lithiasis, hypertension, hypothyroidism, MR, history of pyelonephritis who was brought to the emergency department by [facility] who were called to the scene due to the patient getting choked on a cookie.  Patient was defibrillated with an AED by the fire department before the EMS crew arrived and she was in pulseless electrical activity.  CPR was done for 15 minutes with return of pulses after receiving 60 mg of epinephrine by EMS.  They placed an advanced airway tube, but it got kinked and was lost in route to the emergency department.  She received at least 3 more rounds of epinephrine and the emergency department.  She is currently comatose and unable to provide further history.  ED Course: After CPR her temperature is 92.9 F, pulse 68, respirations 21 and BP 76 over 14 mmHg.  She received a 1000 mL bolus of LR besides ACLS protocol medications.  I added 2 A of sodium bicarb, another 1000 mL of LR bolus and started her on a sodium bicarbonate infusion.  Her CBC shows a white count of 10.0, hemoglobin 12.1 g/dL and platelets 116.  CMP showed normal electrolytes when calcium was corrected to albumin.  Glucose 138, BUN 45, creatinine 2.24 mg/dL.  Total protein 6.4 and albumin 3.3 g/dL.  AST 172, ALT 124 alkaline phosphatase 194 units/L.  Bilirubin was normal. Lactic acid was 9.0 then 3.3 mmol/L.  Troponin was 45 ng/L.  Her ABG had a pH of 7.25, PCO2 of 50.7, PO2 of  126 mmHg.  Bicarbonate was 20.0, acid base deficit is 4.2 mmol/L.  O2 sat is 98.2%.  Imaging: Chest radiograph shows an ET tube just at the carina and multifocal airspace opacities throughout both lungs which could be due to asymmetric edema and/or infectious etiology.  CT head did not show any acute intracranial abnormality.  Please see images and full radiology report for further detail.   Diagnoses  Preliminary cause of death:  Secondary Diagnoses (including complications and co-morbidities):  Principal Problem:   Cardiac arrest with successful resuscitation [facility]) Active Problems:   Hypothyroidism   Cerebral palsy ([place])   Lactic acidosis   CKD (chronic kidney disease), stage IV (HCC)   Transaminitis   Prolonged QT interval   Aspiration into airway   Thrombocytopenia ([place])   DNR (do not resuscitate)   Anoxic brain injury ([place])   Ventricular fibrillation ([place])   Choking episode   Hypothermia   Congestive heart failure ([place])   Advanced care planning/counseling discussion   Cardiac arrest [facility])   Palliative care by specialist   Comfort measures only status   Terminal care  Brief Hospital Course (including significant findings, care, treatment, and services provided and events leading to death)  Rachel Morgan is a 69 y.o. year old female   1. S/p cardiac arrest after acute choking spell - Pt s/p defibrillation with AED and subsequent 15 minutes of CPR and 9 mg epinephrine before ROSC.  She was having spontaneous jerk movements and uncoordinated eye  movements, strongly suspect anoxic brain injury.  Pt now made full comfort care after family conferenced with palliative medicine team. Continue full comfort care orders.  Family requested EEG for brain activity evaluation and that has been ordered however patient expired before test could be done.  Compassionate extubation per family wishes was completed and patient pronounced at 82 by RN.   2. Aspiration into airway - Pt  apparently had severe choking spell after eating cookie.  3. Stage IV CKD  4. Lactic acidosis - likely from prolonged hypoxia, CPR and interventions 5. DNR  6. Hypothermia 7. Hypotension  8. Cerebral palsy 9. Stress hyperglycemia 10. Hypothyroidism 11. Abnormal EKG / Prolonged QTc 12. Thrombocytopenia   Code Status:  DNR  Family Communication: bedside updates  Pertinent Labs and Studies  Significant Diagnostic Studies CT Head Wo Contrast  Result Date: 06-07-2019 CLINICAL DATA:  Encephalopathy. Cardiac arrest. EXAM: CT HEAD WITHOUT CONTRAST TECHNIQUE: Contiguous axial images were obtained from the base of the skull through the vertex without intravenous contrast. COMPARISON:  07/23/2018 FINDINGS: Brain: There is an old left frontal lobe infarct. No acute hemorrhage or extra-axial collection. No midline shift or other mass effect. Generalized volume loss. Vascular: No abnormal hyperdensity of the major intracranial arteries or dural venous sinuses. No intracranial atherosclerosis. Skull: The visualized skull base, calvarium and extracranial soft tissues are normal. Sinuses/Orbits: No fluid levels or advanced mucosal thickening of the visualized paranasal sinuses. No mastoid or middle ear effusion. The orbits are normal. IMPRESSION: 1. No acute intracranial abnormality. 2. Old left frontal lobe infarct. Electronically Signed   By: Ulyses Jarred M.D.   On: 2019-06-07 02:17   DG Chest Portable 1 View  Result Date: 05/26/2019 CLINICAL DATA:  Intubation EXAM: PORTABLE CHEST 1 VIEW COMPARISON:  April 08, 2018 FINDINGS: ET tube is seen just at the level of the carina. A left-sided central venous catheter seen at the superior cavoatrial junction. There is cardiomegaly. Patchy airspace opacity seen at the right upper lung. There is mildly increased interstitial markings seen at the left upper lung. No pleural effusion. No acute osseous abnormality. NG tube is seen within the proximal stomach. IMPRESSION:  ET tube just at the of the carina Multifocal airspace opacities throughout both lungs which could be due to asymmetric edema and/or infectious etiology. Electronically Signed   By: Prudencio Pair M.D.   On: 05/22/2019 21:58   CT Renal Stone Study  Result Date: 05/19/2019 CLINICAL DATA:  Diagnosed with a bladder infection, complicated UTI, altered mental status EXAM: CT ABDOMEN AND PELVIS WITHOUT CONTRAST TECHNIQUE: Multidetector CT imaging of the abdomen and pelvis was performed following the standard protocol without IV contrast. Sagittal and coronal MPR images reconstructed from axial data set. No oral contrast administered. COMPARISON:  11/11/2018 FINDINGS: Lower chest: Minimal subsegmental atelectasis LEFT lower lobe. Hepatobiliary: Multiple depended calculi within gallbladder. Single small calcified granuloma within liver. No hepatic mass lesion. Pancreas: Predominately fatty replaced pancreas. No definite pancreatic mass. Spleen: Normal appearance Adrenals/Urinary Tract: Adrenal glands normal appearance. BILATERAL renal cortical atrophy with multiple BILATERAL nonobstructing renal calculi. Minimal dilatation of RIGHT renal collecting system and RIGHT ureter. No ureteral calcifications. Bladder is moderately well distended without mass or calcification. Stomach/Bowel: Normal retrocecal appendix. Small radiopacity within distal second portion of duodenum likely medication tablet. Stomach and bowel loops otherwise normal appearance Vascular/Lymphatic: Minimal atherosclerotic calcification aorta and iliac arteries. Aorta normal caliber. No adenopathy. Reproductive: Atrophic uterus.  Unremarkable ovaries. Other: No free air or free fluid. No acute inflammatory  process or hernia. Musculoskeletal: Osseous demineralization with mild degenerative changes of the thoracolumbar spine. Mild chronic superior endplate deformity of L3 unchanged. IMPRESSION: Minimal dilatation of the RIGHT renal collecting system and RIGHT  ureter question due to distended urinary bladder. Cholelithiasis. No other intra-abdominal or intrapelvic abnormalities. Electronically Signed   By: Lavonia Dana M.D.   On: 05/19/2019 16:05    Microbiology Recent Results (from the past 240 hour(s))  Urine Culture     Status: Abnormal   Collection Time: 05/19/19  3:03 PM   Specimen: Urine, Clean Catch  Result Value Ref Range Status   Specimen Description   Final    URINE, CLEAN CATCH Performed at [facility], [address] [place], [place] [address]    Special Requests   Final    Immunocompromised Performed at [facility], [address] [place], [place] [address]    Culture >=100,000 COLONIES/mL ENTEROCOCCUS FAECALIS (A)  Final   Report Status 05/22/2019 FINAL  Final   Organism ID, Bacteria ENTEROCOCCUS FAECALIS (A)  Final      Susceptibility   Enterococcus faecalis - MIC*    AMPICILLIN <=2 SENSITIVE Sensitive     NITROFURANTOIN <=16 SENSITIVE Sensitive     VANCOMYCIN 1 SENSITIVE Sensitive     * >=100,000 COLONIES/mL ENTEROCOCCUS FAECALIS  SARS Coronavirus 2 by RT PCR (hospital order, performed in [place] hospital lab) Nasopharyngeal Nasopharyngeal Swab     Status: None   Collection Time: 06/03/2019  9:37 PM   Specimen: Nasopharyngeal Swab  Result Value Ref Range Status   SARS Coronavirus 2 NEGATIVE NEGATIVE Final    Comment: (NOTE) SARS-CoV-2 target nucleic acids are NOT DETECTED. The SARS-CoV-2 RNA is generally detectable in upper and lower respiratory specimens during the acute phase of infection. The lowest concentration of SARS-CoV-2 viral copies this assay can detect is 250 copies / mL. A negative result does not preclude SARS-CoV-2 infection and should not be used as the sole basis for treatment or other patient management decisions.  A negative result may occur with improper specimen collection / handling, submission of specimen other than nasopharyngeal swab, presence of viral mutation(s) within the areas  targeted by this assay, and inadequate number of viral copies (<250 copies / mL). A negative result must be combined with clinical observations, patient history, and epidemiological information. Fact Sheet for Patients:   StrictlyIdeas.no Fact Sheet for Healthcare Providers: BankingDealers.co.za This test is not yet approved or cleared  by the [place] FDA and has been authorized for detection and/or diagnosis of SARS-CoV-2 by FDA under an Emergency Use Authorization (EUA).  This EUA will remain in effect (meaning this test can be used) for the duration of the COVID-19 declaration under Section 564(b)(1) of the Act, 21 U.S.C. section 360bbb-3(b)(1), unless the authorization is terminated or revoked sooner. Performed at [facility], [address] [place], [place] [address]   Culture, blood (routine x 2)     Status: None (Preliminary result)   Collection Time: 05/15/2019  9:55 PM   Specimen: Left Antecubital; Blood  Result Value Ref Range Status   Specimen Description LEFT ANTECUBITAL  Final   Special Requests   Final    BOTTLES DRAWN AEROBIC AND ANAEROBIC Blood Culture adequate volume   Culture   Final    NO GROWTH < 12 HOURS Performed at [facility], [address], [place], [place] [address]    Report Status PENDING  Incomplete  Culture, blood (routine x 2)     Status: None (Preliminary  result)   Collection Time: 05/16/2019  9:56 PM   Specimen: BLOOD RIGHT ARM  Result Value Ref Range Status   Specimen Description BLOOD RIGHT ARM  Final   Special Requests   Final    BOTTLES DRAWN AEROBIC AND ANAEROBIC Blood Culture adequate volume   Culture   Final    NO GROWTH < 12 HOURS Performed at [facility], [address] [place], [place] [address]    Report Status PENDING  Incomplete    Lab Basic Metabolic Panel: Recent Labs  Lab 05/19/19 1505 05/19/19 1505 05/19/19 1906 05/19/19 1928 05/18/2019 2121 05/08/2019 2130  05/29/19 0416  NA 141  --   --  142 143 142 147*  K 5.4*  --   --  5.0 5.1 4.8 5.5*  CL 104  --   --  105 112* 106 108  CO2 28  --   --   --   --  22 25  GLUCOSE 116*  --   --  99 224* 238* 125*  BUN 43*  --   --  39* 54* 45* 46*  CREATININE 2.06*   < > 2.10* 2.20* 2.20* 2.24* 1.97*  CALCIUM 9.2  --   --   --   --  8.4* 8.6*   < > = values in this interval not displayed.   Liver Function Tests: Recent Labs  Lab 05/14/2019 2130 05/29/19 0416  AST 172* 182*  ALT 124* 126*  ALKPHOS 193* 169*  BILITOT 0.7 1.1  PROT 6.4* 6.3*  ALBUMIN 3.3* 3.4*   No results for input(s): LIPASE, AMYLASE in the last 168 hours. No results for input(s): AMMONIA in the last 168 hours. CBC: Recent Labs  Lab 05/19/19 1505 05/19/19 1928 06/02/2019 2121 05/29/2019 2130 May 29, 2019 0416  WBC 4.9  --   --  10.0 7.1  NEUTROABS 3.6  --   --  7.6  --   HGB 12.3 11.9* 12.6 12.1 12.3  HCT 39.7 35.0* 37.0 38.7 40.4  MCV 97.1  --   --  98.5 99.3  PLT 161  --   --  116* 115*   Cardiac Enzymes: No results for input(s): CKTOTAL, CKMB, CKMBINDEX, TROPONINI in the last 168 hours. Sepsis Labs: Recent Labs  Lab 05/19/19 1505 06/01/2019 2130 06/05/2019 2155 06/02/2019 2339 2019-05-29 0416  WBC 4.9 10.0  --   --  7.1  LATICACIDVEN  --   --  9.0* 3.3* 3.1*    Procedures/Operations   Grabiel Schmutz MD  2019-05-29, 3:14 PM How to contact the [facility] Attending or Consulting provider [place] or covering provider during after hours [place], for this patient?  1. Check the care team in [facility] and look for a) attending/consulting [facility] provider listed and b) the [facility] team listed 2. Log into [email or web address] and use [place]'s universal password to access. If you do not have the password, please contact the hospital operator. 3. Locate the [facility] provider you are looking for under [facility] and page to a number that you can be directly reached. 4. If you still have difficulty reaching the provider, please page the [facility] (Director on  Call) for the Hospitalists listed on amion for assistance.

## 2019-06-08 DEATH — deceased

## 2019-11-03 ENCOUNTER — Ambulatory Visit: Payer: Medicare Other | Admitting: Urology

## 2020-12-25 IMAGING — CR RIGHT ANKLE - COMPLETE 3+ VIEW
3 series · 3 of 3 positions shown · non-contrast
Comparison: Right foot 01/17/2016

CLINICAL DATA: Pain  and swelling

EXAM:
RIGHT ANKLE - COMPLETE 3+ VIEW

[ap]
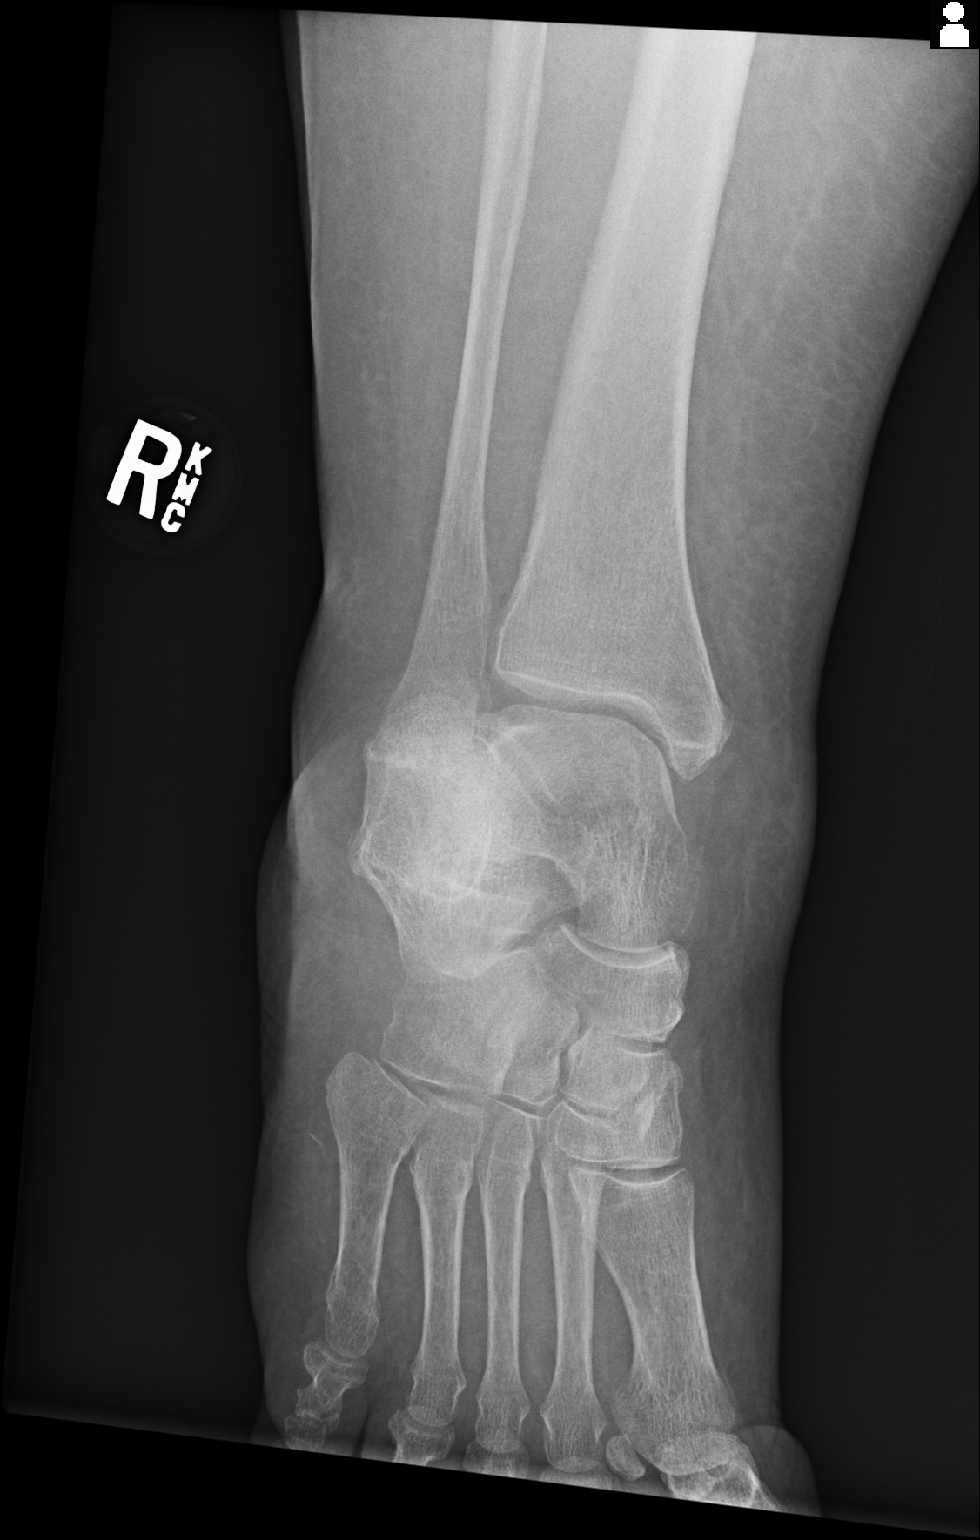

[lat]
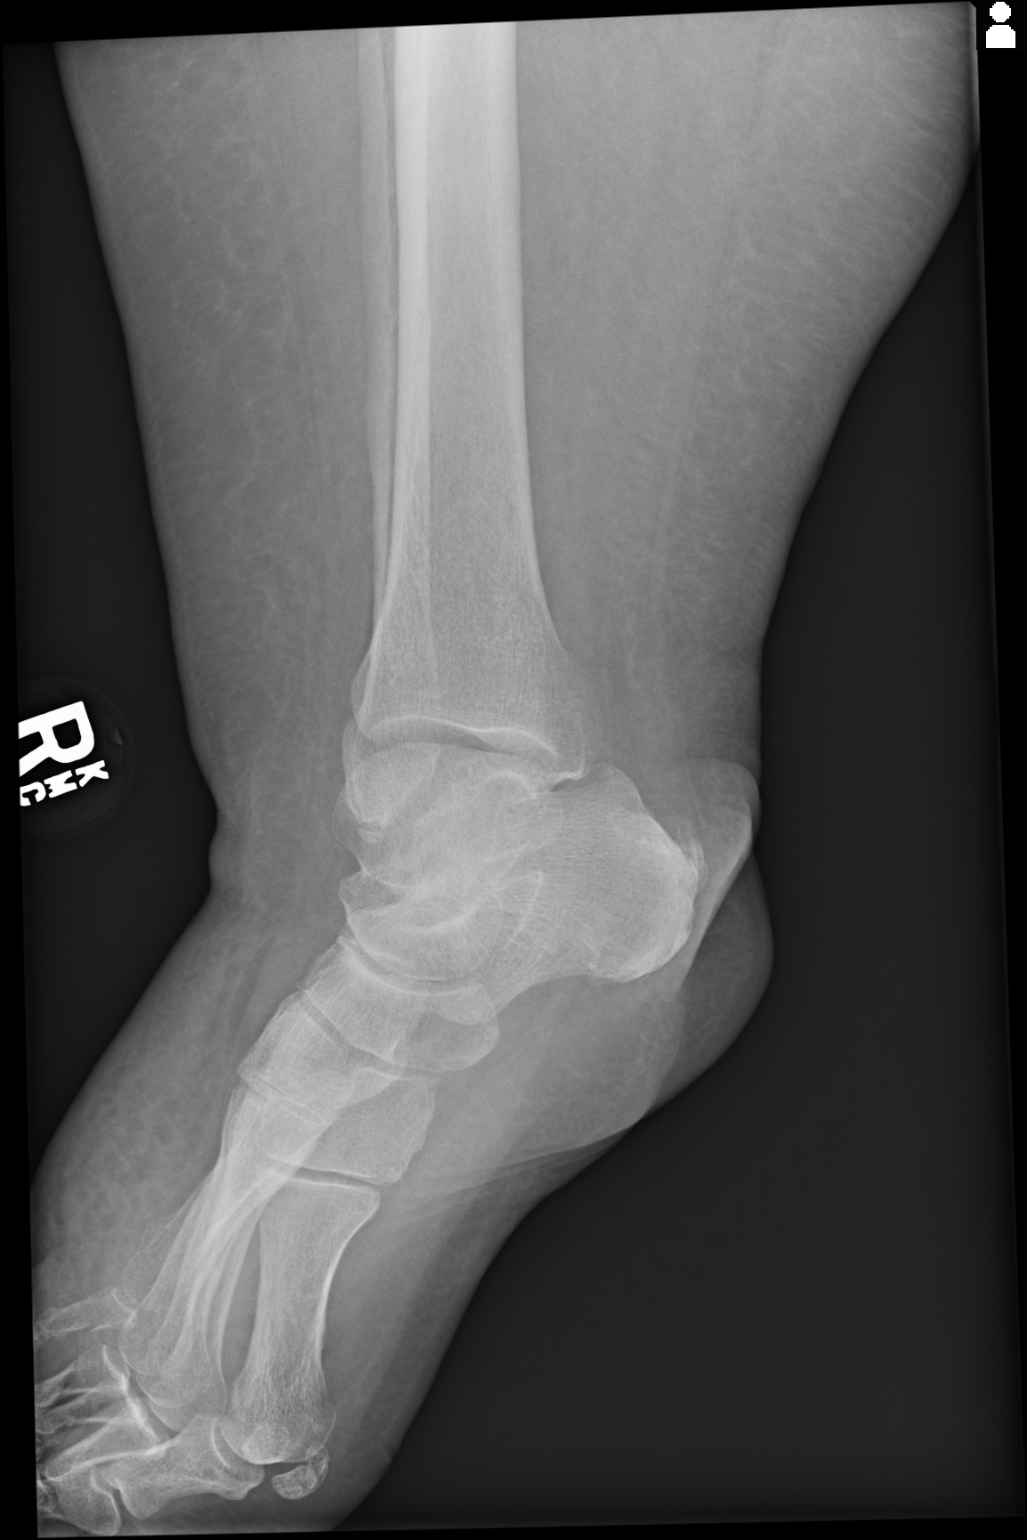

[oblique]
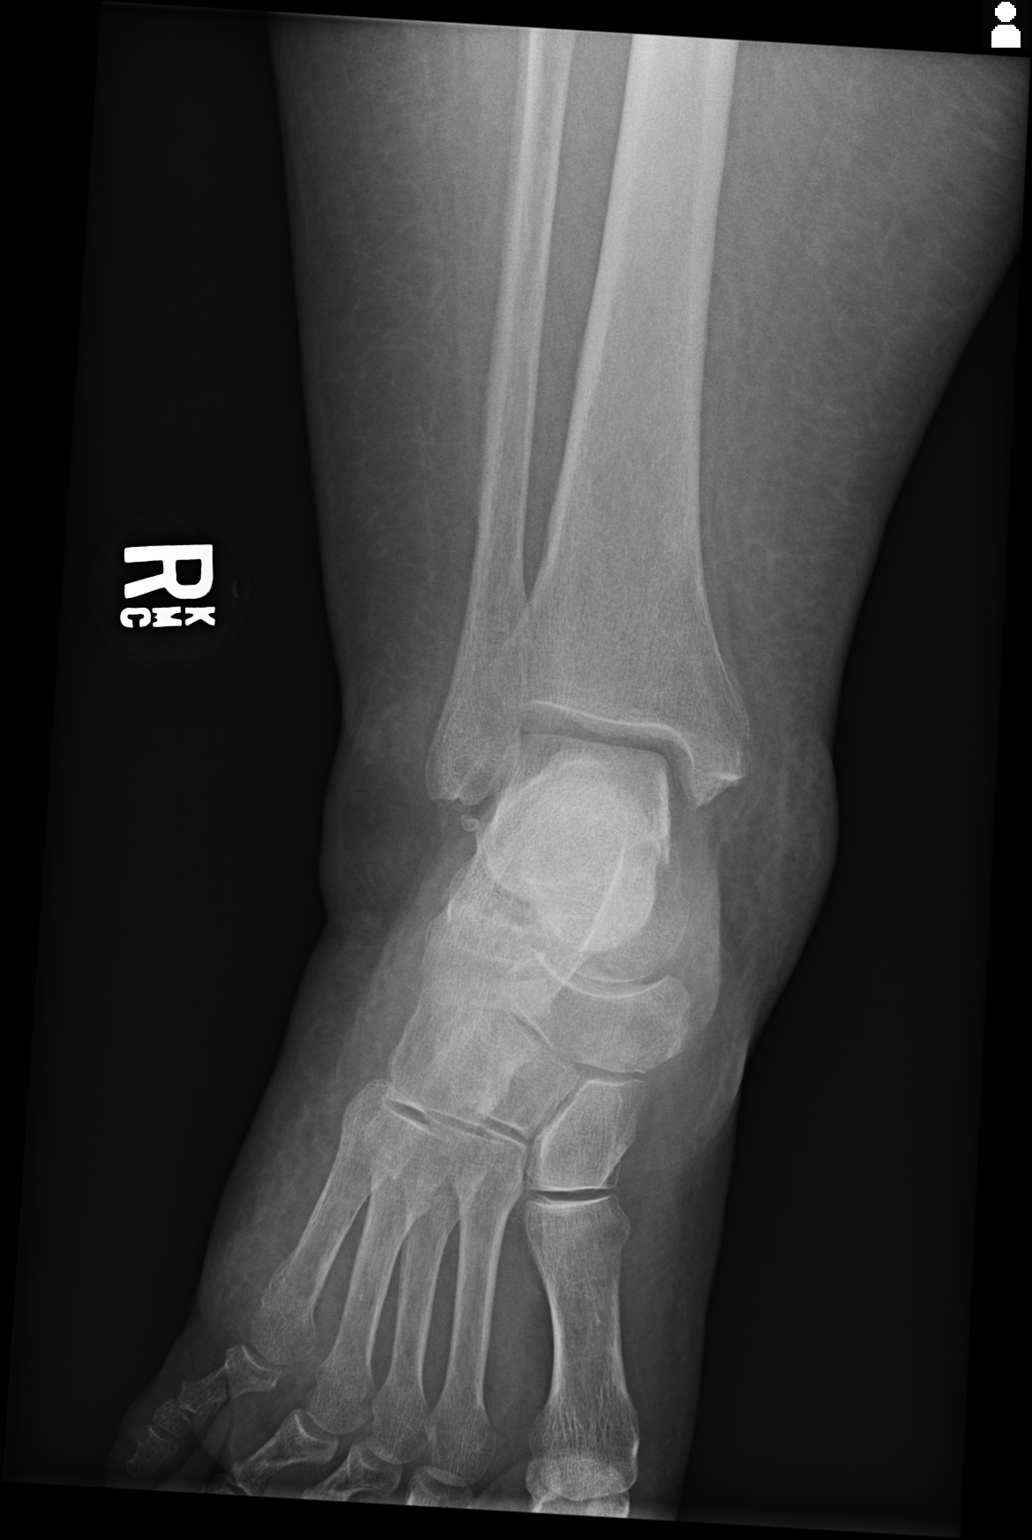

[3 of 3 positions shown; findings below may reference images not displayed]

FINDINGS: Negative for acute fracture. Ankle joint space normal. No effusion.
Small ossicle at the tip of the distal fibula may be due to old
injury. Diffuse soft tissue swelling.
IMPRESSION: Diffuse soft tissue swelling.  Negative for acute fracture.

## 2020-12-25 IMAGING — CR PORTABLE CHEST - 1 VIEW
1 series · 1 of 1 positions shown · non-contrast
Comparison: 05/13/2018

CLINICAL DATA: Shortness of breath.

EXAM:
PORTABLE CHEST 1 VIEW

[portable]
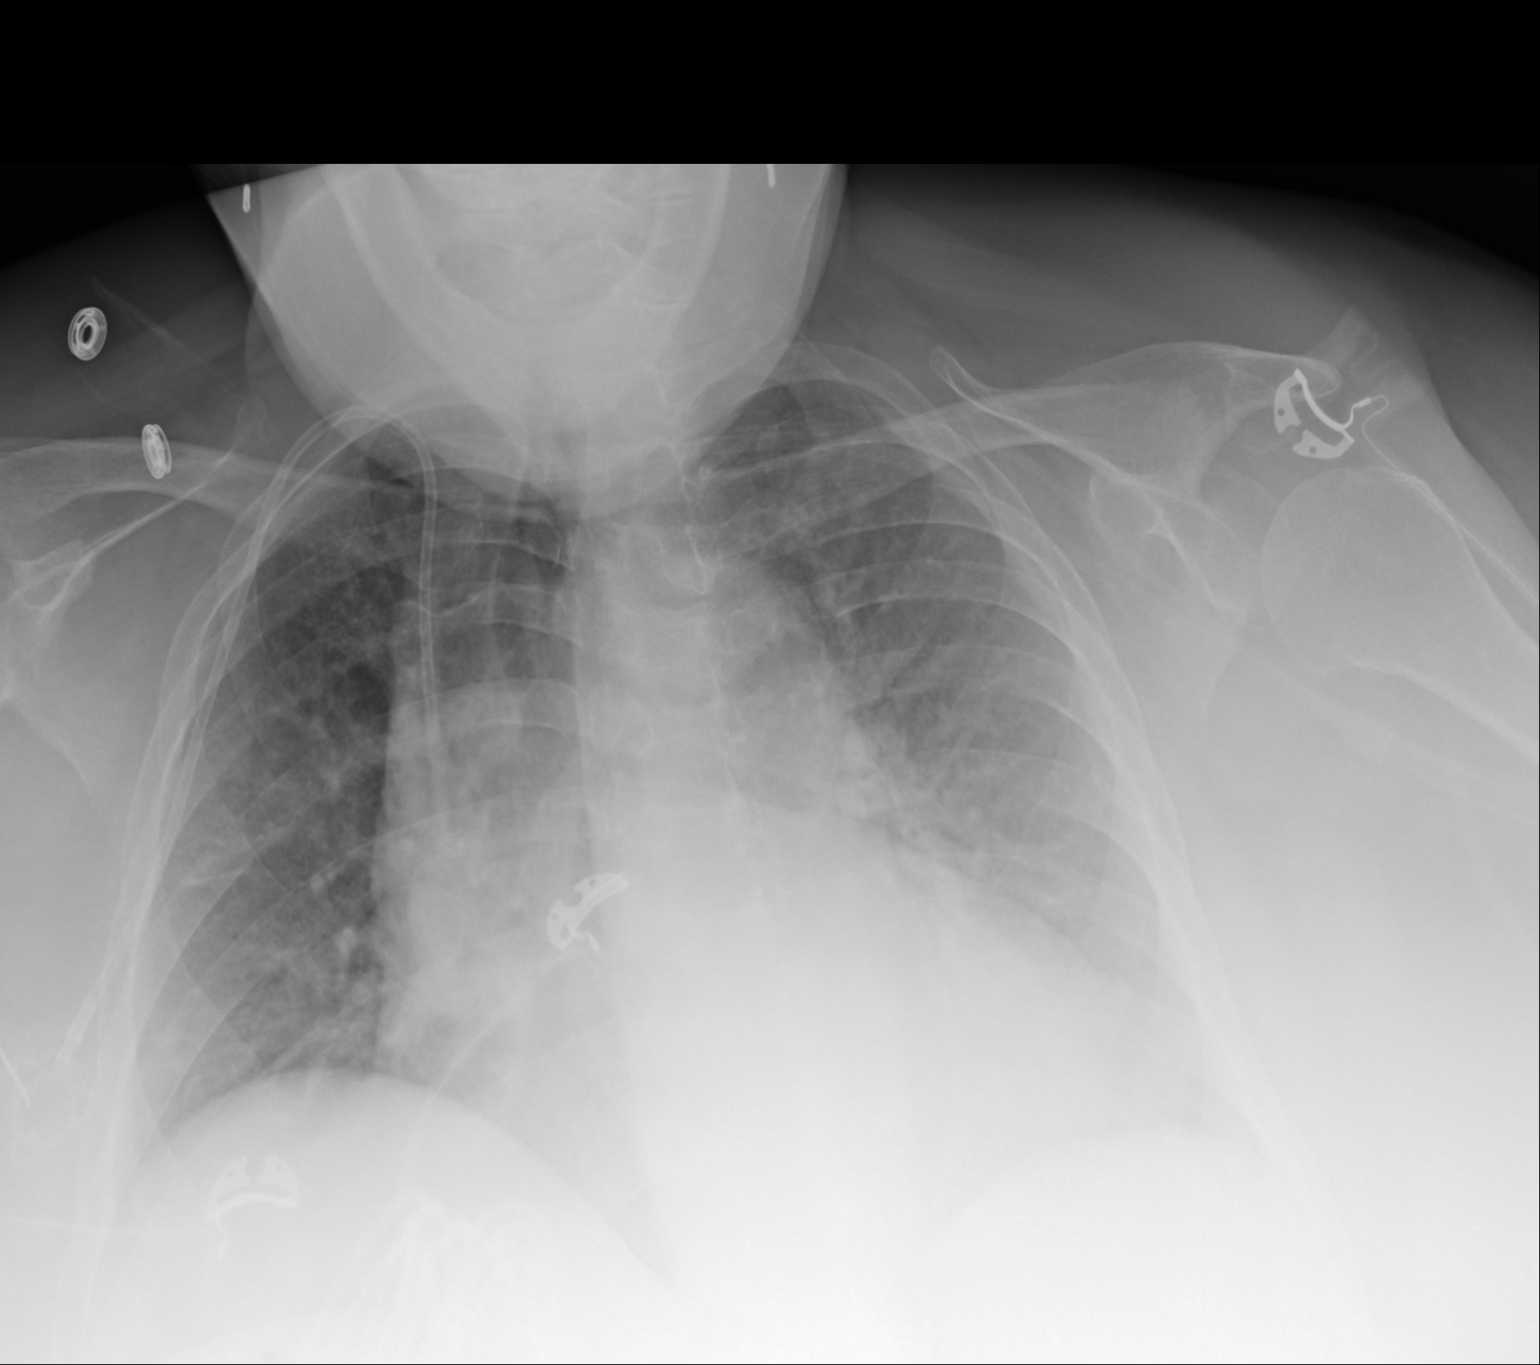

[1 of 1 positions shown; findings below may reference images not displayed]

FINDINGS: 5463 hours. The cardio pericardial silhouette is enlarged. Vascular
congestion is associated with pulmonary edema pattern bilaterally.
No substantial pleural effusions. The visualized bony structures of
the thorax are intact. Right Port-A-Cath again noted. Telemetry
leads overlie the chest.
IMPRESSION: Cardiomegaly with vascular congestion and pulmonary edema pattern.

## 2020-12-31 IMAGING — CR PORTABLE CHEST - 1 VIEW
1 series · 1 of 1 positions shown · non-contrast
Comparison: 06/05/2018 chest radiograph.

CLINICAL DATA: Respiratory failure, hypoxia

EXAM:
PORTABLE CHEST 1 VIEW

[pa]
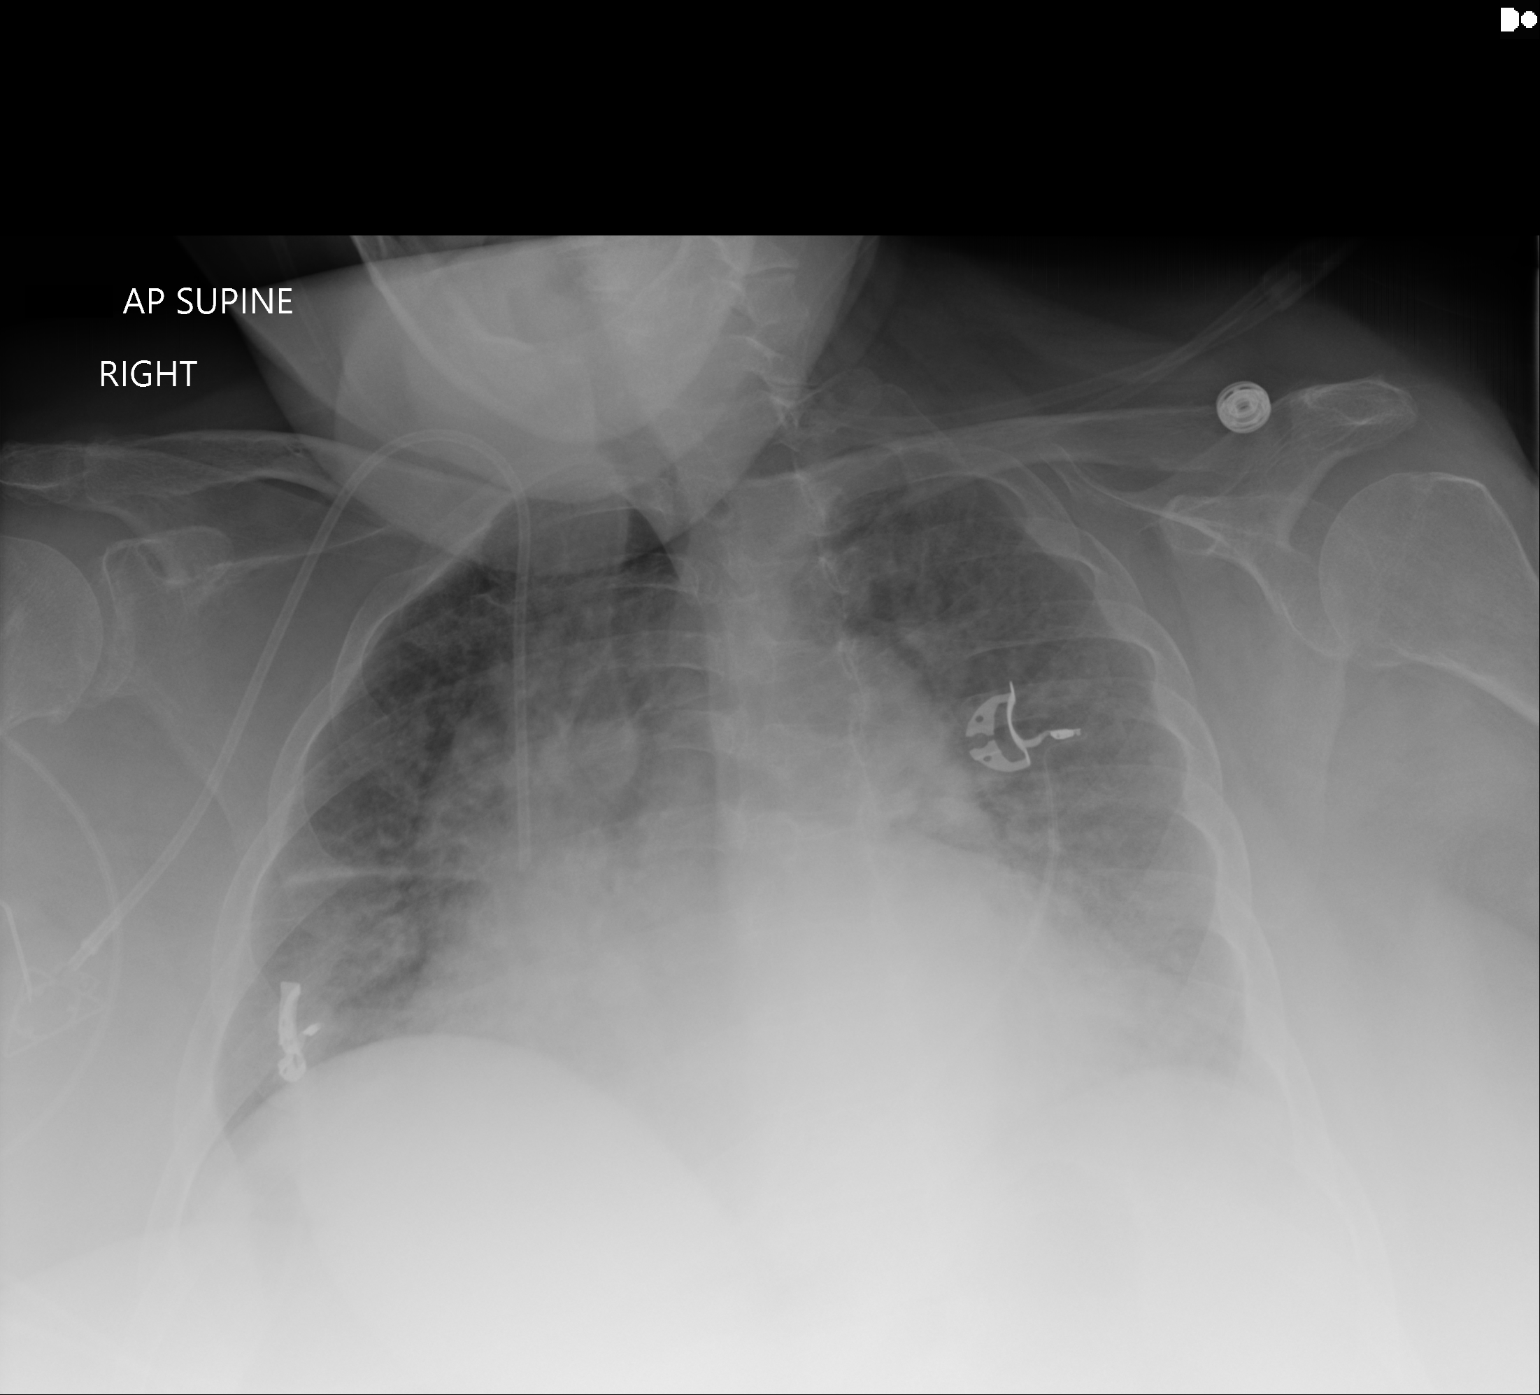

[1 of 1 positions shown; findings below may reference images not displayed]

FINDINGS: Right internal jugular Port-A-Cath terminates at the cavoatrial
junction. Stable cardiomediastinal silhouette with mild
cardiomegaly. No pneumothorax. No pleural effusion. Right rotated
chest radiograph. Mild-to-moderate pulmonary edema appears slightly
worsened.
IMPRESSION: Mild-to-moderate congestive heart failure, slightly worsened.
